# Patient Record
Sex: Female | Born: 1955 | Race: White | Hispanic: No | Smoking: Never smoker
Health system: Southern US, Community
[De-identification: ages and names within clinical notes are randomized; demographics above are authoritative.]

## PROBLEM LIST (undated history)

## (undated) DIAGNOSIS — M310 Hypersensitivity angiitis: Secondary | ICD-10-CM

## (undated) DIAGNOSIS — I10 Essential (primary) hypertension: Secondary | ICD-10-CM

## (undated) DIAGNOSIS — I350 Nonrheumatic aortic (valve) stenosis: Secondary | ICD-10-CM

## (undated) DIAGNOSIS — A5002 Early congenital syphilitic osteochondropathy: Secondary | ICD-10-CM

## (undated) DIAGNOSIS — G4730 Sleep apnea, unspecified: Secondary | ICD-10-CM

## (undated) DIAGNOSIS — F32A Depression, unspecified: Secondary | ICD-10-CM

## (undated) DIAGNOSIS — D649 Anemia, unspecified: Secondary | ICD-10-CM

## (undated) DIAGNOSIS — T7840XA Allergy, unspecified, initial encounter: Secondary | ICD-10-CM

## (undated) DIAGNOSIS — T8859XA Other complications of anesthesia, initial encounter: Secondary | ICD-10-CM

## (undated) DIAGNOSIS — F419 Anxiety disorder, unspecified: Secondary | ICD-10-CM

## (undated) DIAGNOSIS — N289 Disorder of kidney and ureter, unspecified: Secondary | ICD-10-CM

## (undated) DIAGNOSIS — G43909 Migraine, unspecified, not intractable, without status migrainosus: Secondary | ICD-10-CM

## (undated) DIAGNOSIS — I499 Cardiac arrhythmia, unspecified: Secondary | ICD-10-CM

## (undated) DIAGNOSIS — B079 Viral wart, unspecified: Secondary | ICD-10-CM

## (undated) DIAGNOSIS — K219 Gastro-esophageal reflux disease without esophagitis: Secondary | ICD-10-CM

## (undated) DIAGNOSIS — I471 Supraventricular tachycardia, unspecified: Secondary | ICD-10-CM

## (undated) DIAGNOSIS — M9080 Osteopathy in diseases classified elsewhere, unspecified site: Secondary | ICD-10-CM

## (undated) DIAGNOSIS — F329 Major depressive disorder, single episode, unspecified: Secondary | ICD-10-CM

## (undated) DIAGNOSIS — T4145XA Adverse effect of unspecified anesthetic, initial encounter: Secondary | ICD-10-CM

## (undated) DIAGNOSIS — E1129 Type 2 diabetes mellitus with other diabetic kidney complication: Secondary | ICD-10-CM

## (undated) DIAGNOSIS — B078 Other viral warts: Secondary | ICD-10-CM

## (undated) DIAGNOSIS — R011 Cardiac murmur, unspecified: Secondary | ICD-10-CM

## (undated) HISTORY — DX: Anxiety disorder, unspecified: F41.9

## (undated) HISTORY — DX: Hypersensitivity angiitis: M31.0

## (undated) HISTORY — DX: Viral wart, unspecified: B07.9

## (undated) HISTORY — DX: Depression, unspecified: F32.A

## (undated) HISTORY — DX: Gastro-esophageal reflux disease without esophagitis: K21.9

## (undated) HISTORY — DX: Type 2 diabetes mellitus with other diabetic kidney complication: E11.29

## (undated) HISTORY — DX: Allergy, unspecified, initial encounter: T78.40XA

## (undated) HISTORY — PX: JOINT REPLACEMENT: SHX530

## (undated) HISTORY — DX: Major depressive disorder, single episode, unspecified: F32.9

## (undated) HISTORY — PX: BUNIONECTOMY: SHX129

## (undated) HISTORY — PX: TOTAL KNEE ARTHROPLASTY: SHX125

---
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
## 1978-03-17 HISTORY — PX: TUBAL LIGATION: SHX77

## 1998-01-19 ENCOUNTER — Ambulatory Visit (HOSPITAL_COMMUNITY): Admission: RE | Admit: 1998-01-19 | Discharge: 1998-01-19 | Payer: Self-pay | Admitting: Family Medicine

## 1998-01-19 ENCOUNTER — Encounter: Payer: Self-pay | Admitting: Family Medicine

## 2000-03-17 HISTORY — PX: CARPAL TUNNEL RELEASE: SHX101

## 2002-08-16 HISTORY — PX: TONSILLECTOMY: SHX5217

## 2004-02-07 ENCOUNTER — Encounter: Admission: RE | Admit: 2004-02-07 | Discharge: 2004-02-07 | Payer: Self-pay | Admitting: Family Medicine

## 2006-07-08 ENCOUNTER — Encounter: Admission: RE | Admit: 2006-07-08 | Discharge: 2006-07-08 | Payer: Self-pay | Admitting: Family Medicine

## 2007-07-30 ENCOUNTER — Encounter: Admission: RE | Admit: 2007-07-30 | Discharge: 2007-07-30 | Payer: Self-pay | Admitting: Family Medicine

## 2008-11-14 ENCOUNTER — Encounter: Payer: Self-pay | Admitting: Pulmonary Disease

## 2008-11-27 ENCOUNTER — Encounter: Payer: Self-pay | Admitting: Pulmonary Disease

## 2008-11-30 ENCOUNTER — Ambulatory Visit: Payer: Self-pay | Admitting: Pulmonary Disease

## 2008-11-30 DIAGNOSIS — R059 Cough, unspecified: Secondary | ICD-10-CM | POA: Insufficient documentation

## 2008-11-30 DIAGNOSIS — R519 Headache, unspecified: Secondary | ICD-10-CM | POA: Insufficient documentation

## 2008-11-30 DIAGNOSIS — R51 Headache: Secondary | ICD-10-CM | POA: Insufficient documentation

## 2008-11-30 DIAGNOSIS — R05 Cough: Secondary | ICD-10-CM | POA: Insufficient documentation

## 2008-11-30 LAB — CONVERTED CEMR LAB
Angiotensin 1 Converting Enzyme: 44 units/L (ref 9–67)
Anti Nuclear Antibody(ANA): NEGATIVE
Hep B C IgM: NEGATIVE
Hepatitis B Surface Ag: NEGATIVE

## 2008-12-04 ENCOUNTER — Ambulatory Visit: Payer: Self-pay | Admitting: Cardiology

## 2008-12-07 ENCOUNTER — Telehealth: Payer: Self-pay | Admitting: Pulmonary Disease

## 2008-12-07 DIAGNOSIS — B1710 Acute hepatitis C without hepatic coma: Secondary | ICD-10-CM

## 2008-12-07 DIAGNOSIS — R3129 Other microscopic hematuria: Secondary | ICD-10-CM | POA: Insufficient documentation

## 2008-12-07 DIAGNOSIS — I776 Arteritis, unspecified: Secondary | ICD-10-CM | POA: Insufficient documentation

## 2008-12-07 HISTORY — DX: Acute hepatitis C without hepatic coma: B17.10

## 2008-12-07 LAB — CONVERTED CEMR LAB
ALT: 21 units/L (ref 0–35)
Albumin: 3.7 g/dL (ref 3.5–5.2)
Basophils Relative: 0.1 % (ref 0.0–3.0)
Bilirubin, Direct: 0.1 mg/dL (ref 0.0–0.3)
CO2: 31 meq/L (ref 19–32)
CRP, High Sensitivity: 28.1 — ABNORMAL HIGH (ref 0.00–5.00)
Chloride: 102 meq/L (ref 96–112)
Eosinophils Absolute: 0.2 10*3/uL (ref 0.0–0.7)
Eosinophils Relative: 2.6 % (ref 0.0–5.0)
HCT: 38.1 % (ref 36.0–46.0)
Lymphs Abs: 1.8 10*3/uL (ref 0.7–4.0)
MCHC: 34.4 g/dL (ref 30.0–36.0)
MCV: 94.4 fL (ref 78.0–100.0)
Monocytes Absolute: 0.2 10*3/uL (ref 0.1–1.0)
Neutro Abs: 7.1 10*3/uL (ref 1.4–7.7)
Potassium: 3.6 meq/L (ref 3.5–5.1)
RBC: 4.04 M/uL (ref 3.87–5.11)
Rhuematoid fact SerPl-aCnc: <20 intl units/mL (ref 0.0–20.0)
Specific Gravity, Urine: 1.02 (ref 1.000–1.030)
TSH: 0.99 microintl units/mL (ref 0.35–5.50)
Total Protein: 8.1 g/dL (ref 6.0–8.3)
Urine Glucose: NEGATIVE mg/dL
Urobilinogen, UA: 1 (ref 0.0–1.0)
WBC: 9.3 10*3/uL (ref 4.5–10.5)
pH: 7 (ref 5.0–8.0)

## 2008-12-08 ENCOUNTER — Telehealth: Payer: Self-pay | Admitting: Pulmonary Disease

## 2008-12-12 ENCOUNTER — Ambulatory Visit: Payer: Self-pay | Admitting: Pulmonary Disease

## 2008-12-19 ENCOUNTER — Ambulatory Visit: Payer: Self-pay | Admitting: Pulmonary Disease

## 2008-12-20 ENCOUNTER — Encounter: Payer: Self-pay | Admitting: Pulmonary Disease

## 2008-12-21 ENCOUNTER — Encounter: Payer: Self-pay | Admitting: Pulmonary Disease

## 2009-01-03 ENCOUNTER — Ambulatory Visit: Payer: Self-pay | Admitting: Pulmonary Disease

## 2009-01-03 DIAGNOSIS — R0600 Dyspnea, unspecified: Secondary | ICD-10-CM | POA: Insufficient documentation

## 2009-01-03 DIAGNOSIS — R0689 Other abnormalities of breathing: Secondary | ICD-10-CM | POA: Insufficient documentation

## 2009-01-31 ENCOUNTER — Encounter: Payer: Self-pay | Admitting: Pulmonary Disease

## 2010-04-07 ENCOUNTER — Encounter: Payer: Self-pay | Admitting: Family Medicine

## 2010-04-08 ENCOUNTER — Encounter: Payer: Self-pay | Admitting: Pulmonary Disease

## 2010-04-10 ENCOUNTER — Telehealth (INDEPENDENT_AMBULATORY_CARE_PROVIDER_SITE_OTHER): Payer: Self-pay | Admitting: *Deleted

## 2010-04-10 LAB — SURGICAL PCR SCREEN: Staphylococcus aureus: NEGATIVE

## 2010-04-10 LAB — CBC
HCT: 37.7 % (ref 36.0–46.0)
Hemoglobin: 12.5 g/dL (ref 12.0–15.0)
MCH: 30.1 pg (ref 26.0–34.0)
MCHC: 33.2 g/dL (ref 30.0–36.0)

## 2010-04-10 LAB — BASIC METABOLIC PANEL
BUN: 15 mg/dL (ref 6–23)
Chloride: 104 mEq/L (ref 96–112)
Potassium: 4.1 mEq/L (ref 3.5–5.1)
Sodium: 138 mEq/L (ref 135–145)

## 2010-04-10 LAB — PROTIME-INR: INR: 0.96 (ref 0.00–1.49)

## 2010-04-16 ENCOUNTER — Inpatient Hospital Stay (HOSPITAL_COMMUNITY)
Admission: RE | Admit: 2010-04-16 | Discharge: 2010-04-19 | DRG: 209 | Disposition: A | Discharge status: Home Health | Payer: BC Managed Care – PPO | Attending: Orthopaedic Surgery | Admitting: Orthopaedic Surgery

## 2010-04-16 DIAGNOSIS — E669 Obesity, unspecified: Secondary | ICD-10-CM | POA: Diagnosis present

## 2010-04-16 DIAGNOSIS — IMO0002 Reserved for concepts with insufficient information to code with codable children: Principal | ICD-10-CM | POA: Diagnosis present

## 2010-04-16 DIAGNOSIS — M1710 Unilateral primary osteoarthritis, unspecified knee: Principal | ICD-10-CM | POA: Diagnosis present

## 2010-04-16 DIAGNOSIS — K219 Gastro-esophageal reflux disease without esophagitis: Secondary | ICD-10-CM | POA: Diagnosis present

## 2010-04-16 LAB — HEPATIC FUNCTION PANEL
AST: 21 U/L (ref 0–37)
Albumin: 3.7 g/dL (ref 3.5–5.2)
Total Bilirubin: 0.6 mg/dL (ref 0.3–1.2)

## 2010-04-17 LAB — BASIC METABOLIC PANEL
CO2: 30 mEq/L (ref 19–32)
Calcium: 8.6 mg/dL (ref 8.4–10.5)
Chloride: 97 mEq/L (ref 96–112)
GFR calc Af Amer: >60 mL/min (ref >60)
Glucose, Bld: 119 mg/dL — ABNORMAL HIGH (ref 70–99)
Potassium: 4 mEq/L (ref 3.5–5.1)
Sodium: 135 mEq/L (ref 135–145)

## 2010-04-17 LAB — CBC
HCT: 33.5 % — ABNORMAL LOW (ref 36.0–46.0)
Hemoglobin: 11.2 g/dL — ABNORMAL LOW (ref 12.0–15.0)
MCH: 31.1 pg (ref 26.0–34.0)
MCHC: 33.4 g/dL (ref 30.0–36.0)
MCV: 93.1 fL (ref 78.0–100.0)
RBC: 3.6 MIL/uL — ABNORMAL LOW (ref 3.87–5.11)

## 2010-04-17 LAB — PROTIME-INR: INR: 1.03 (ref 0.00–1.49)

## 2010-04-18 LAB — CBC
HCT: 27.2 % — ABNORMAL LOW (ref 36.0–46.0)
MCH: 32.1 pg (ref 26.0–34.0)
MCV: 92.8 fL (ref 78.0–100.0)
Platelets: 163 10*3/uL (ref 150–400)
RDW: 13.1 % (ref 11.5–15.5)
WBC: 11.9 10*3/uL — ABNORMAL HIGH (ref 4.0–10.5)

## 2010-04-18 LAB — BASIC METABOLIC PANEL
CO2: 32 mEq/L (ref 19–32)
Chloride: 95 mEq/L — ABNORMAL LOW (ref 96–112)
Creatinine, Ser: 0.69 mg/dL (ref 0.4–1.2)
GFR calc Af Amer: >60 mL/min (ref >60)
Glucose, Bld: 125 mg/dL — ABNORMAL HIGH (ref 70–99)
Sodium: 134 mEq/L — ABNORMAL LOW (ref 135–145)

## 2010-04-18 NOTE — Progress Notes (Signed)
Summary: Records Request  Faxed OV to [facility] at [facility] Short Stay ([phone]). Debby Freiberg  April 10, 2010 12:40 PM

## 2010-04-19 LAB — BASIC METABOLIC PANEL
BUN: 7 mg/dL (ref 6–23)
CO2: 34 mEq/L — ABNORMAL HIGH (ref 19–32)
Glucose, Bld: 103 mg/dL — ABNORMAL HIGH (ref 70–99)
Potassium: 3.4 mEq/L — ABNORMAL LOW (ref 3.5–5.1)
Sodium: 140 mEq/L (ref 135–145)

## 2010-04-19 LAB — CBC
Hemoglobin: 9 g/dL — ABNORMAL LOW (ref 12.0–15.0)
MCH: 30.9 pg (ref 26.0–34.0)
MCHC: 33.6 g/dL (ref 30.0–36.0)
MCV: 92.1 fL (ref 78.0–100.0)
RBC: 2.91 MIL/uL — ABNORMAL LOW (ref 3.87–5.11)

## 2010-05-08 NOTE — Discharge Summary (Signed)
NAMERAKEYA, GLAB                 ACCOUNT NO.:  [id number]  MEDICAL RECORD NO.:  [id number]           PATIENT TYPE:  I  LOCATION:  5034                         FACILITY:  [facility]  PHYSICIAN:  Lubertha Basque. Macayla Ekdahl, M.D.DATE OF BIRTH:  [birth year]  DATE OF ADMISSION:  04/16/2010 DATE OF DISCHARGE:  04/19/2010                              DISCHARGE SUMMARY   ADMITTING DIAGNOSES: 1. Right knee end-stage degenerative joint disease. 2. History of depression. 3. Status post tubal ligation. 4. Status post foot surgeries.  DISCHARGE DIAGNOSES: 1. Right knee end-stage degenerative joint disease. 2. History of depression. 3. Status post tubal ligation. 4. Status post foot surgeries.  OPERATION:  Right total knee replacement.  BRIEF HISTORY:  Tomeshia is a patient well-known to our practice, who is 55 years old.  She has been complaining of right knee pain, trouble walking, and trouble sleeping.  She has failed anti-inflammatory medicines and injections, and her x-rays revealed bone-on-bone end-stage DJD.  We talked about the benefits and risk and complications of total knee replacement.  PERTINENT LABORATORY AND X-RAY FINDINGS:  Last INR 1.18.  Chem-7; sodium 134, potassium 3.9, BUN 5, creatinine 0.69, glucose 125.  Hemoglobin 9.4, hematocrit 27.2, WBCs 11.9, platelets 163.  COURSE IN THE HOSPITAL:  She was admitted postoperatively, kept on her home medicines, which will be outlined at the end of this dictation and available in the discharge med management sheets.  The postoperative total joint replacement protocol was ordered.  She had implemented total joint replacement protocol as well as implemented orthopedic p.r.n. orders.  Regular diet as tolerated, advance slowly.  IV fluids, Lovenox, and Coumadin regulated by Pharmacy for DVT prophylaxis and we wanted Coumadin for 2 weeks and then Lovenox until her INR was close to 2 or greater, but below 3.0.  Appropriate antiemetics,  antispasmodics, stool softeners.  Foley catheter used initially and then discontinued.  CPM machine 0-60 degrees advancing as tolerated about 5-6 hours per day and then appropriate pain medicines and what we found was the most helpful was the oxycodone IR 5 mg one or two q.4-6 along with Robaxin as a muscle relaxer 500 mg one p.o. q.6-8.  Physical therapy weightbearing as tolerated.  Serial laboratory data was taken, and this is inclusive in electronic medical record.  The first day postop, her vital signs were stable. Wound was noted to be benign.  No sign of infection or irritation. Normal neurovascular status in her lower extremities.  Lungs were clear. Abdomen was soft.  Temperature max 100.3, but was in the normal range for most of the other readings.  Blood pressure 128/85.  Lungs clear. Abdomen soft with positive bowel sounds.  She was eating and then catheter was removed and was able to void well.  She had work with physical therapy and had gotten to the door, but progress was slow and then felt that maybe a skilled nursing facility would be helpful for therapy reasons plus she lives alone.  Second day postop, her dressing was changed.  Her wound was noted to be benign.  Her drain was pulled.  No sign of infection or  irritation. Calf soft and nontender.  Negative Homans.  Lungs were clear.  Abdomen was soft.  Hemoglobin 9.4, INR 1.18.  blood pressure 113/78.  Condition on discharge is improved.  She can be weightbearing as tolerated.  We would still like to have her working with physical therapy as soon as possible for motion and strengthening and gait.  Diet unrestricted.  May change her dressing daily.  Any sign of infection, to call our office at [phone], we will see her right away, otherwise we would see her back in 2 weeks from the surgical date.  Staples will be left in until that visit.  She will remain on her home medicines which are available in the discharge  medications summary as well as OxyIR, Robaxin, and probably necessary to keep her on Lovenox until her Coumadin INR level is 2.0-3.0.  Current dose of Lovenox in the hospital is 30 mg subcu q.12.  The total length of time on Coumadin will be 2 weeks from the surgical date.  Use of a CPM machine once again 0-50-60 degrees advancing often 5-10 degrees as tolerated, 5-6 hours per day would be appropriate.     Lindwood Qua, P.A.   ______________________________ Lubertha Basque. Jerl Santos, M.D.    MC/MEDQ  D:  04/18/2010  T:  04/19/2010  Job:  [id number]  Electronically Signed by Lindwood Qua P.A. on 04/19/2010 41:32:44 PM Electronically Signed by Marcene Corning M.D. on 05/08/2010 02:19:17 PM

## 2010-05-08 NOTE — Op Note (Signed)
NAMEFATEMAH, POURCIAU                 ACCOUNT NO.:  [id number]  MEDICAL RECORD NO.:  [id number]          PATIENT TYPE:  INP  LOCATION:  5034                         FACILITY:  [facility]  PHYSICIAN:  Lubertha Basque. Gussie Murton, M.D.DATE OF BIRTH:  01/08/1956  DATE OF PROCEDURE:  04/16/2010 DATE OF DISCHARGE:                              OPERATIVE REPORT   PREOPERATIVE DIAGNOSIS:  Right knee degenerative joint disease.  POSTOPERATIVE DIAGNOSIS:  Right knee degenerative joint disease.  PROCEDURE:  Right total knee replacement.  ANESTHESIA:  General and block.  ATTENDING SURGEON:  Lubertha Basque. Jerl Santos, MD  ASSISTANT:  Lindwood Qua, PA   INDICATIONS FOR PROCEDURE:  The patient is a 55 year old woman with a long history of painful right knee.  She has advanced degenerative change by x-ray and failed oral anti-inflammatories and multiple injectables.  She has pain, which limits ability to rest and walk, and she is offered a knee replacement.  Informed operative consent was obtained after discussion of possible complications including reaction to anesthesia, infection, DVT, PE, and death.  The importance of the postoperative rehabilitation protocol to optimize result was stressed extensively with the patient.  SUMMARY FINDINGS AND PROCEDURE:  Under general anesthesia and block, a right knee replacement was performed.  She had advanced degenerative change medial and patellofemoral and excellent bone quality.  We addressed her problem with a cemented DePuy system using a standard plus femur, 38 all polyethylene patella, 10 deep dish spacer, and 3 MBT tibial tray to address her stature.  We did include Zinacef antibiotic in the cement.  Lindwood Qua assisted throughout and was invaluable to the completion of the case in that he helped position and retract while I performed the procedure.  DESCRIPTION OF PROCEDURE:  The patient was brought to the operating suite where general anesthetic was  applied without difficulty.  She also given a block in the preanesthesia area.  She was positioned supine and prepped and draped in normal sterile fashion.  After administration of IV Kefzol, the right leg was elevated, exsanguinated, and a tourniquet inflated about the thigh.  A longitudinal anterior incision was made with dissection down the extensor mechanism.  All appropriate anti- infective measures were used including closed hooded exhaust systems for each member of the surgical team, Betadine-impregnated drape, and the preoperative IV antibiotic.  A medial parapatellar incision was made. The kneecap was flipped and knee flexed.  Some residual meniscal tissues were removed along with the ACL and PCL.  An extramedullary guide was placed in the tibia to make a roughly flat cut.  An intramedullary guide was then placed in the femur to make anterior and posterior cuts creating a flexion gap of 10 mm.  A second intramedullary guide was placed in the femur to make a distal cut creating an equal extension gap of 10 mm.  We tensionally set her up a little bit loose as she did have a bit of a flexion contracture preop.  I performed a bit of a medial release off the tibia to address her varus deformity, and the knee was balanced.  The femur sized to a standard plus  and the tibia to a three with appropriate guides placed and utilized including reaming for short stem on the tibia to address her stature.  A trial reduction was done with all these components and the 10 spacer, and she easily came to slight hyperextension and flexed well.  We cut down the patella and thickness by about 10 mm to size 15.  This seemed to fit with the 38 trial, and the appropriate guide was placed and utilized.  Trial components removed followed by pulsatile lavage, irrigation of all three cut bony surfaces.  Cement was mixed including Zinacef and was pressurized onto the dried bones followed by placement of  the aforementioned DePuy components.  Excess cement was trimmed and pressure was held on the components until the cement had hardened.  The tourniquet was deflated and a small amount of bleeding was easily controlled with Bovie cautery and pressure.  A drain was placed exiting superolaterally.  The extensor mechanism was reapproximated with #1 Vicryl interrupted fashion followed by subcutaneous reapproximation with 0 and 2-0 undyed Vicryl and skin closure with staples.  Adaptic was applied followed by dry gauze and a loose Ace wrap.  Estimated blood loss and intraoperative fluids can be obtained from anesthesia records as can accurate tourniquet time.  DISPOSITION:  The patient was extubated in the operating room and taken to the recovery room in stable condition.  She was to be admitted to the Orthopedic Surgery Service for appropriate postop care to include perioperative antibiotics and Coumadin plus Lovenox for DVT prophylaxis.     Lubertha Basque Jerl Santos, M.D.     PGD/MEDQ  D:  04/16/2010  T:  04/17/2010  Job:  [id number]  Electronically Signed by Marcene Corning M.D. on 05/08/2010 02:20:16 PM

## 2010-12-18 ENCOUNTER — Other Ambulatory Visit: Payer: Self-pay | Admitting: Family Medicine

## 2010-12-18 DIAGNOSIS — Z1231 Encounter for screening mammogram for malignant neoplasm of breast: Secondary | ICD-10-CM

## 2011-01-02 ENCOUNTER — Ambulatory Visit: Payer: BC Managed Care – PPO

## 2011-01-15 ENCOUNTER — Ambulatory Visit
Admission: RE | Admit: 2011-01-15 | Discharge: 2011-01-15 | Disposition: A | Discharge status: Home / Self Care | Payer: BC Managed Care – PPO | Source: Ambulatory Visit | Attending: Family Medicine | Admitting: Family Medicine

## 2011-01-15 DIAGNOSIS — Z1231 Encounter for screening mammogram for malignant neoplasm of breast: Secondary | ICD-10-CM

## 2011-01-22 ENCOUNTER — Other Ambulatory Visit: Payer: Self-pay | Admitting: Family Medicine

## 2011-01-22 DIAGNOSIS — R928 Other abnormal and inconclusive findings on diagnostic imaging of breast: Secondary | ICD-10-CM

## 2011-02-26 ENCOUNTER — Ambulatory Visit
Admission: RE | Admit: 2011-02-26 | Discharge: 2011-02-26 | Disposition: A | Discharge status: Home / Self Care | Payer: BC Managed Care – PPO | Source: Ambulatory Visit | Attending: Family Medicine | Admitting: Family Medicine

## 2011-02-26 DIAGNOSIS — R928 Other abnormal and inconclusive findings on diagnostic imaging of breast: Secondary | ICD-10-CM

## 2012-05-30 ENCOUNTER — Encounter: Payer: Self-pay | Admitting: *Deleted

## 2012-06-11 ENCOUNTER — Encounter: Payer: Self-pay | Admitting: Physician Assistant

## 2012-06-11 ENCOUNTER — Ambulatory Visit (INDEPENDENT_AMBULATORY_CARE_PROVIDER_SITE_OTHER): Payer: BC Managed Care – PPO | Admitting: Physician Assistant

## 2012-06-11 VITALS — BP 114/70 | HR 60 | Temp 98.1°F | Resp 18 | Ht 62.0 in | Wt 244.0 lb

## 2012-06-11 DIAGNOSIS — Z0000 Encounter for general adult medical examination without abnormal findings: Secondary | ICD-10-CM

## 2012-06-11 DIAGNOSIS — F32A Depression, unspecified: Secondary | ICD-10-CM | POA: Insufficient documentation

## 2012-06-11 DIAGNOSIS — N3281 Overactive bladder: Secondary | ICD-10-CM

## 2012-06-11 DIAGNOSIS — F329 Major depressive disorder, single episode, unspecified: Secondary | ICD-10-CM | POA: Insufficient documentation

## 2012-06-11 DIAGNOSIS — K219 Gastro-esophageal reflux disease without esophagitis: Secondary | ICD-10-CM | POA: Insufficient documentation

## 2012-06-11 DIAGNOSIS — N318 Other neuromuscular dysfunction of bladder: Secondary | ICD-10-CM

## 2012-06-11 DIAGNOSIS — F419 Anxiety disorder, unspecified: Secondary | ICD-10-CM | POA: Insufficient documentation

## 2012-06-11 MED ORDER — OXYBUTYNIN CHLORIDE 5 MG PO TABS: 5.0000 mg | ORAL_TABLET | Freq: Three times a day (TID) | ORAL | Status: DC

## 2012-06-11 MED ORDER — ESOMEPRAZOLE MAGNESIUM 40 MG PO CPDR: 40.0000 mg | DELAYED_RELEASE_CAPSULE | Freq: Every day | ORAL | Status: DC

## 2012-06-12 NOTE — Progress Notes (Signed)
Patient ID: Cassandra [name] MRN: [id number], DOB: 29-May-1955, 57 y.o. Date of Encounter: 06/12/2012,   Chief Complaint: Physical (CPE)  HPI: 57 y.o. y/o female with history of noted below here for CPE.   Reports tha when took Myrbetriq she had pain in left back. Went off Mybetriq. Tried taking it again-had back pain again so has been off of it since.  In past used Vesicare but this was stopped b/c it caused severe dry mouth. Also pt notes this med cost her $50. Does want to get on med for this. "Would rather deal with dry mouth than leaking urine."  Review of Systems: Consitutional: No fever, chills, fatigue, night sweats, lymphadenopathy. No significant/unexplained weight changes. Eyes: No visual changes, eye redness, or discharge. ENT/Mouth: No ear pain, sore throat, nasal drainage, or sinus pain. Cardiovascular: No chest pressure,heaviness, tightness or squeezing, even with exertion. No increased shortness of breath or dyspnea on exertion.No palpitations, edema, orthopnea, PND. Respiratory: No cough, hemoptysis, SOB, or wheezing. Gastrointestinal: No anorexia, dysphagia, reflux, pain, nausea, vomiting, hematemesis, diarrhea, constipation, BRBPR, or melena. Breast: No mass, nodules, bulging, or retraction. No skin changes or inflammation. No nipple discharge. No lymphadenopathy. Genitourinary: No dysuria, hematuria, vaginal discharge, pruritis, burning, abnormal bleeding, or pain. Musculoskeletal: No decreased ROM, No joint pain or swelling. No significant pain in neck, back, or extremities. Skin: No rash, pruritis, or concerning lesions. Neurological: No headache, dizziness, syncope, seizures, tremors, memory loss, coordination problems, or paresthesias. Psychological: No anxiety, depression, hallucinations, SI/HI. Endocrine: No polydipsia, polyphagia, polyuria, or known diabetes.No increased fatigue. No palpitations/rapid heart rate. No significant/unexplained weight change. All other  systems were reviewed and are otherwise negative.  Past Medical History  Diagnosis Date  . Allergy   . Anxiety   . Depression   . GERD (gastroesophageal reflux disease)      Past Surgical History  Procedure Laterality Date  . Tonsillectomy  08/2002  . Carpal tunnel release Right 2002  . Tubal ligation  1980    Home Meds:  No current outpatient prescriptions on file prior to visit.   No current facility-administered medications on file prior to visit.    Allergies:  Allergies  Allergen Reactions  . Ampicillin   . Clarithromycin   . Codeine   . Effexor (Venlafaxine)   . Flagyl (Metronidazole)   . Fluoxetine Hcl Itching    Deep Itch   . Levofloxacin   . Myrbetriq (Mirabegron)     Caused back pain  . Nsaids Other (See Comments)    Hypersensitivity vasculitis  . Paxil (Paroxetine)   . Sulfa Antibiotics   . Tetracycline     History   Social History  . Marital Status: Divorced    Spouse Name: N/A    Number of Children: N/A  . Years of Education: N/A   Occupational History  . Not on file.   Social History Main Topics  . Smoking status: Never Smoker   . Smokeless tobacco: Not on file  . Alcohol Use: No  . Drug Use: No  . Sexually Active: Not on file   Other Topics Concern  . Not on file   Social History Narrative  . No narrative on file    Family History  Problem Relation Age of Onset  . Clotting disorder Mother 81    Blood clot, foot amputation  . Arthritis Sister   . Heart attack Maternal Grandmother 51    Physical Exam: Blood pressure 114/70, pulse 60, temperature 98.1 F (36.7 C), temperature source  Oral, resp. rate 18, height 5\' 2"  (1.575 m), weight 244 lb (110.678 kg)., Body mass index is 44.62 kg/(m^2). General: Obese WF. in no acute distress. HEENT: Normocephalic, atraumatic. Conjunctiva pink, sclera non-icteric. Pupils 2 mm constricting to 1 mm, round, regular, and equally reactive to light and accomodation. EOMI. Internal auditory  canal clear. TMs with good cone of light and without pathology. Nasal mucosa pink. Nares are without discharge. No sinus tenderness. Oral mucosa pink.  Pharynx without exudate.   Neck: Supple. Trachea midline. No thyromegaly. Full ROM. No lymphadenopathy.No Carotid Bruits. Lungs: Clear to auscultation bilaterally without wheezes, rales, or rhonchi. Breathing is of normal effort and unlabored. Cardiovascular: RRR with S1 S2. No murmurs, rubs, or gallops. Distal pulses 2+ symmetrically. No carotid or abdominal bruits. Breast: Symmetrical. No masses. Nipples without discharge. Abdomen: Soft, non-tender, non-distended with normoactive bowel sounds. No hepatosplenomegaly or masses. No rebound/guarding. No CVA tenderness. No hernias.  Genitourinary:  External genitalia without lesions. Vaginal mucosa pink.No discharge present. Cervix pink and without discharge. No cervical tenderness.Normal uterus size. No adnexal mass or tenderness.   Musculoskeletal: Full range of motion and 5/5 strength throughout. Without swelling, atrophy, tenderness, crepitus, or warmth. Extremities without clubbing, cyanosis, or edema. Calves supple. Skin: Warm and moist without erythema, ecchymosis, wounds, or rash. Neuro: A+Ox3. CN II-XII grossly intact. Moves all extremities spontaneously. Full sensation throughout. Normal gait. DTR 2+ throughout upper and lower extremities. Finger to nose intact. Psych:  Responds to questions appropriately with a normal affect.     Mammogram: Last 12/12. Pt to schedule f/u. She is aware and agreeable  DEXA/BMD: will wait to start in future Colonoscopy: Had 1/13 Pap: 9/12: Nml. Repeat 3-5 years  Immunization History  Administered Date(s) Administered  . Influenza Split 11/27/2011  . Td 12/16/2003   Immunizations: Flu: UTD  Tetanus:UTD Pneumococcal: At age 31 Zostavax: At Age 73  Assessment/Plan:  57 y.o. y/o female here for CPE 1. Visit for preventive health examination See above  reg preventive tests, immunizations. She had pap here 9/12 which was normal. Wait 3-5 years to repeat. She had screening labs last week. They were discussed today. She is to decrease sweets, breads, potatoes, pasta to decrease triglycerides.  She is to increase Vit D from 2,000 to 3,000 IU QD. Remainder of labs nml.  2. Overactive bladder Discussed with pt that other meds for OAB (except Myrbetiq. See HPI -intolerant to Myrbetriq) will probably cause some dry mouth, etc) She is agreeable to use med-says can deal with dry mouth but not leaking urine. However, Vesicare was $50 for her in past. Will try cheaper option. - oxybutynin (DITROPAN) 5 MG tablet; Take 1 tablet (5 mg total) by mouth 3 (three) times daily.  Dispense: 90 tablet; Refill: 11  3. GERD (gastroesophageal reflux disease) Nexium controls her symptoms. Needs to cont med. Without med does have gerd symptoms. - esomeprazole (NEXIUM) 40 MG capsule; Take 1 capsule (40 mg total) by mouth daily.  Dispense: 30 capsule; Refill: [address] [place], [place], [facility] 06/12/2012 7:28 AM

## 2012-07-15 ENCOUNTER — Telehealth: Payer: Self-pay | Admitting: Physician Assistant

## 2012-07-15 MED ORDER — SERTRALINE HCL 50 MG PO TABS: 50.0000 mg | ORAL_TABLET | Freq: Every day | ORAL | Status: DC

## 2012-07-15 NOTE — Telephone Encounter (Signed)
Sertraline 50 mg tablet take 2 by mouth at bedtime last office visit 06/11/12 last refill 03/15/12

## 2012-07-15 NOTE — Telephone Encounter (Signed)
Medication refilled per protocol. 

## 2012-07-27 ENCOUNTER — Telehealth: Payer: Self-pay | Admitting: Family Medicine

## 2012-07-27 NOTE — Telephone Encounter (Signed)
Spoke to pt she states that after taking Nexium her mouth broke out with sores and her tongue started swelling. She had some Magic Mouthwash at home and used it and the blisters went away. She would like to try something else to see if she has the same rxn. She has taken Prevacid in the past with no rxn but insurance would not cover it. I told her to come by here and get samples of Prilosec OTC 20 mg and try taking that and if she has not rxn to that we would be glad to call her in a prescription.

## 2012-07-28 NOTE — Telephone Encounter (Signed)
Agree 

## 2012-07-29 NOTE — Telephone Encounter (Signed)
Pt has been called. Sample medication at front desk

## 2012-08-31 ENCOUNTER — Ambulatory Visit (INDEPENDENT_AMBULATORY_CARE_PROVIDER_SITE_OTHER): Payer: BC Managed Care – PPO | Admitting: Family Medicine

## 2012-08-31 ENCOUNTER — Encounter: Payer: Self-pay | Admitting: Family Medicine

## 2012-08-31 VITALS — BP 140/86 | HR 78 | Temp 98.2°F | Resp 18 | Wt 238.0 lb

## 2012-08-31 DIAGNOSIS — F411 Generalized anxiety disorder: Secondary | ICD-10-CM

## 2012-08-31 MED ORDER — SERTRALINE HCL 50 MG PO TABS: 50.0000 mg | ORAL_TABLET | Freq: Every day | ORAL | Status: DC

## 2012-08-31 NOTE — Progress Notes (Signed)
  Subjective:    Patient ID: Cassandra [name], female    DOB: 08-12-1955, 57 y.o.   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  This has been quiescent for 2 years since she quit NSAIDs.  However, recently, she began to develop sores in her mouth and swelling in her lips.  She thought it was her medicines so she quit zoloft and nexium.  Gradually, the reaction stopped over 1 month.  However, she now has terrible anxiety and daily panic attacks.  She would like to resume the zoloft since she is not sure it was the cause of the reaction.   Past Medical History  Diagnosis Date  . Allergy   . Anxiety   . Depression   . GERD (gastroesophageal reflux disease)    Patient Active Problem List   Diagnosis Date Noted  . Anxiety   . Depression   . GERD (gastroesophageal reflux disease)   . DYSPNEA 01/03/2009  . HEPATITIS C 12/07/2008  . VASCULITIS 12/07/2008  . MICROSCOPIC HEMATURIA 12/07/2008  . HEADACHE, CHRONIC 11/30/2008  . COUGH 11/30/2008   Current Outpatient Prescriptions on File Prior to Visit  Medication Sig Dispense Refill  . Cholecalciferol (VITAMIN D3) 2000 UNITS CHEW Chew 2,000 Units by mouth daily.      Marland Kitchen oxybutynin (DITROPAN) 5 MG tablet Take 1 tablet (5 mg total) by mouth 3 (three) times daily.  90 tablet  11   No current facility-administered medications on file prior to visit.   Allergies  Allergen Reactions  . Ampicillin   . Clarithromycin   . Codeine   . Effexor (Venlafaxine)   . Flagyl (Metronidazole)   . Fluoxetine Hcl Itching    Deep Itch   . Levofloxacin   . Myrbetriq (Mirabegron)     Caused back pain  . Nexium (Esomeprazole Magnesium) Swelling    Swelling of tongue and sores in mouth  . Nsaids Other (See Comments)    Hypersensitivity vasculitis  . Paxil (Paroxetine)   . Sulfa Antibiotics   . Tetracycline       Review of Systems  All other systems reviewed and are negative.       Objective:   Physical  Exam  Vitals reviewed. Cardiovascular: Normal rate, regular rhythm and normal heart sounds.   Pulmonary/Chest: Effort normal and breath sounds normal.  Psychiatric: She has a normal mood and affect. Her behavior is normal. Judgment and thought content normal.          Assessment & Plan:  1. GAD (generalized anxiety disorder) Resume zoloft 50 mg poqhs.  Recheck in 4 weeks and increase if necessary.  DC med immediately if reaction returns.  Consider vasculitis as a cause of reaction if we rule out medication as a cause of reaction.

## 2012-10-22 ENCOUNTER — Other Ambulatory Visit: Payer: Self-pay | Admitting: Family Medicine

## 2013-01-20 ENCOUNTER — Other Ambulatory Visit: Payer: Self-pay

## 2013-03-15 ENCOUNTER — Encounter (HOSPITAL_COMMUNITY): Payer: Self-pay | Admitting: Emergency Medicine

## 2013-03-15 ENCOUNTER — Emergency Department (INDEPENDENT_AMBULATORY_CARE_PROVIDER_SITE_OTHER)
Admission: EM | Admit: 2013-03-15 | Discharge: 2013-03-15 | Disposition: A | Discharge status: Home / Self Care | Payer: Self-pay | Source: Home / Self Care | Attending: Family Medicine | Admitting: Family Medicine

## 2013-03-15 DIAGNOSIS — H1010 Acute atopic conjunctivitis, unspecified eye: Secondary | ICD-10-CM

## 2013-03-15 DIAGNOSIS — H1012 Acute atopic conjunctivitis, left eye: Secondary | ICD-10-CM

## 2013-03-15 NOTE — ED Notes (Signed)
Concern for poss pink eye 

## 2013-03-15 NOTE — ED Provider Notes (Signed)
Cassandra [name] is a 57 y.o. female who presents to Urgent Care today for left eye conjunctival injection associated with itchy watery eyes and a mild foreign body sensation. Patient denies any object getting into her eye. Her symptoms started after she was eating some almonds which she suspects that she is allergic to. This started yesterday. She had mouth itching and tingling which has since resolved after some Benadryl yesterday. She denies any swelling or trouble breathing or swallowing. She denies any significant blurry vision.   Past Medical History  Diagnosis Date  . Allergy   . Anxiety   . Depression   . GERD (gastroesophageal reflux disease)    History  Substance Use Topics  . Smoking status: Never Smoker   . Smokeless tobacco: Not on file  . Alcohol Use: No   ROS as above Medications reviewed. No current facility-administered medications for this encounter.   Current Outpatient Prescriptions  Medication Sig Dispense Refill  . Cholecalciferol (VITAMIN D3) 2000 UNITS CHEW Chew 2,000 Units by mouth daily.      . famotidine (PEPCID) 20 MG tablet Take 20 mg by mouth 2 (two) times daily.      Marland Kitchen oxybutynin (DITROPAN) 5 MG tablet Take 1 tablet (5 mg total) by mouth 3 (three) times daily.  90 tablet  11  . sertraline (ZOLOFT) 50 MG tablet TAKE TWO TABLETS BY MOUTH AT BEDTIME  60 tablet  5    Exam:  BP 138/64  Pulse 69  Temp(Src) 98 F (36.7 C) (Oral)  Resp 16  SpO2 100% Gen: Well NAD HEENT: EOMI,  MMM left conjunctival injection. Right eye normal. PERRLA. Fluorescein stain shows no abrasions. No foreign bodies are seen.  No tongue or lip swelling   Assessment and Plan: 57 y.o. female with left I. allergic conjunctivitis. Plan for treatment with oral cetirizine and histamine as well as topical Zaditor eyedrops and systain artificial tears. \Discussed warning signs or symptoms. Please see discharge instructions. Patient expresses understanding.      Rodolph Bong,  MD 03/15/13 [phone]

## 2013-03-16 ENCOUNTER — Emergency Department (HOSPITAL_COMMUNITY)
Admission: EM | Admit: 2013-03-16 | Discharge: 2013-03-16 | Disposition: A | Discharge status: Home / Self Care | Payer: Self-pay | Attending: Emergency Medicine | Admitting: Emergency Medicine

## 2013-03-16 ENCOUNTER — Encounter (HOSPITAL_COMMUNITY): Payer: Self-pay | Admitting: Emergency Medicine

## 2013-03-16 DIAGNOSIS — F411 Generalized anxiety disorder: Secondary | ICD-10-CM | POA: Insufficient documentation

## 2013-03-16 DIAGNOSIS — H1045 Other chronic allergic conjunctivitis: Secondary | ICD-10-CM | POA: Insufficient documentation

## 2013-03-16 DIAGNOSIS — F329 Major depressive disorder, single episode, unspecified: Secondary | ICD-10-CM | POA: Insufficient documentation

## 2013-03-16 DIAGNOSIS — F3289 Other specified depressive episodes: Secondary | ICD-10-CM | POA: Insufficient documentation

## 2013-03-16 DIAGNOSIS — H109 Unspecified conjunctivitis: Secondary | ICD-10-CM

## 2013-03-16 DIAGNOSIS — K219 Gastro-esophageal reflux disease without esophagitis: Secondary | ICD-10-CM | POA: Insufficient documentation

## 2013-03-16 DIAGNOSIS — Z79899 Other long term (current) drug therapy: Secondary | ICD-10-CM | POA: Insufficient documentation

## 2013-03-16 MED ORDER — BACITRACIN-POLYMYXIN B 500-10000 UNIT/GM OP OINT: 1.0000 "application " | TOPICAL_OINTMENT | Freq: Two times a day (BID) | OPHTHALMIC | Status: DC

## 2013-03-16 MED ORDER — EPINEPHRINE 0.3 MG/0.3ML IJ SOAJ
0.3000 mg | INTRAMUSCULAR | Status: AC
  Administered 2013-03-16: 0.3 mg via SUBCUTANEOUS
  Filled 2013-03-16: qty 0.3

## 2013-03-16 NOTE — ED Notes (Signed)
Pt ambulatory to exam room with steady gait. Pt with no acute distress. Pt holding cool compress to L eye.

## 2013-03-16 NOTE — ED Notes (Addendum)
Pt had allergic reaction yesterday to almonds and was seen and [facility], pt was not given meds but pt states she has not gotten better. Pt c/o swelling to left side of face. Pt was also diagnosed with conjunctivitis and told to get OTC eye drops.

## 2013-03-16 NOTE — ED Provider Notes (Signed)
CSN: [id number]     Arrival date & time 03/16/13  2033 History   None    This chart was scribed for non-physician practitioner, Ruby Cola PA-C working with No att. providers found by Arlan Organ, ED Scribe. This patient was seen in room WTR5/WTR5 and the patient's care was started at 10:29 PM.   Chief Complaint  Patient presents with  . Allergic Reaction  . Conjunctivitis   HPI  HPI Comments: Cassandra [name] is a 57 y.o. female who presents to the Emergency Department complaining of an allergic reaction that initially occurred yesterday afternoon. Pt states she had a reaction to almonds yesterday, and went to [facility] for treatment shortly after onset of symptoms. She reports swelling and itching of her lips and tongue, and mouth sores within minutes, but says she has consumed almonds in the past. She states she was not given any medication, but says her symptoms have no improved. Pt was also diagnosed with conjunctivitis related to allergies, and was advised to get OTC eye drops for treatment. She says her eye symptoms started around the same time as the initial swelling of her lips and tongue. She says she is experiencing vision changes in her left eye. She reports a similar episode some time ago with pecans and voltaren. She currently reports swelling and irritation to her upper lip and sores to the inside of her mouth. She has tried OTC Zyrtec and eye drops with mild relief. She denies any recent injury to her eye. She states she wears reading glasses only. She reports a h/o anxiety, depression, and GERD.   Past Medical History  Diagnosis Date  . Allergy   . Anxiety   . Depression   . GERD (gastroesophageal reflux disease)    Past Surgical History  Procedure Laterality Date  . Tonsillectomy  08/2002  . Carpal tunnel release Right 2002  . Tubal ligation  1980   Family History  Problem Relation Age of Onset  . Clotting disorder Mother 81    Blood clot, foot  amputation  . Arthritis Sister   . Heart attack Maternal Grandmother 51   History  Substance Use Topics  . Smoking status: Never Smoker   . Smokeless tobacco: Not on file  . Alcohol Use: No   OB History   Grav Para Term Preterm Abortions TAB SAB Ect Mult Living                 Review of Systems  All other systems reviewed and are negative.    Allergies  Ampicillin; Clarithromycin; Codeine; Effexor; Flagyl; Fluoxetine hcl; Levofloxacin; Myrbetriq; Nexium; Nsaids; Paxil; Sulfa antibiotics; and Tetracycline  Home Medications   Current Outpatient Rx  Name  Route  Sig  Dispense  Refill  . cetirizine (ZYRTEC) 10 MG tablet   Oral   Take 10 mg by mouth daily.         Marland Kitchen ketotifen (ZADITOR) 0.025 % ophthalmic solution      1 drop 2 (two) times daily.         Marland Kitchen omeprazole (PRILOSEC) 20 MG capsule   Oral   Take 20 mg by mouth daily.         . sertraline (ZOLOFT) 100 MG tablet   Oral   Take 100 mg by mouth at bedtime.          Triage Vitals: BP 133/83  Pulse 73  Temp(Src) 97.8 F (36.6 C) (Oral)  Resp 20  SpO2 98%  Physical  Exam  Nursing note and vitals reviewed. Constitutional: She is oriented to person, place, and time. She appears well-developed and well-nourished. No distress.  HENT:  Head: Normocephalic and atraumatic.  No edema of lips/tongue.  No lesions of skin or buccal mucosa.    Eyes:  Diffuse conjunctival injection of L eye.  Uniform; not more prominent at limbus.  Thin film of mucous visible across the cornea. PERRL.  EOMi.  No pain w/ ROM.  No photophobia.  No tenderness of globe or orbital bones.   VA 20/20 on R and 20/30 on L.  Neck: Normal range of motion.  Cardiovascular: Normal rate and regular rhythm.   Pulmonary/Chest: Effort normal and breath sounds normal. No respiratory distress.  Musculoskeletal: Normal range of motion.  Neurological: She is alert and oriented to person, place, and time.  Skin: Skin is warm and dry. No rash noted.   Psychiatric: She has a normal mood and affect. Her behavior is normal.    ED Course  Procedures (including critical care time)  DIAGNOSTIC STUDIES: Oxygen Saturation is 98% on RA, Normal by my interpretation.    COORDINATION OF CARE: 10:42 PM-Discussed treatment plan with pt at bedside and pt agreed to plan.     Labs Review Labs Reviewed - No data to display Imaging Review No results found.  EKG Interpretation   None       MDM   1. Conjunctivitis    Healthy 57yo F presents w/ multiple complaints.  Developed tingling, edema and mild pain of lips and tongue as well as throat tightness immediately after eating almonds yesterday afternoon.  Sx improved after taking benadryl.  Was evaluated to [facility] and no objective findings of allergic reaction on exam.  These sx continue to improve but she still has irritation of inner aspect of upper lip.  I suspect that this is self inflicted from scratching; no skin changes nor edema.  Recommended that she continue zyrtec and benadryl.  She received an epi-pen today as well (uninsured and can not afford; nursing staff gave instructions for administration).  Also c/o L eye injection/pruritis/discomfort that started at same time.  UCC diagnosed w/ allergic conjunctivitis and recommended OTC Zatidor.  Examined for corneal abrasion at that time.  Pt has not had relief w/ this medication and has since noticed mild visual impairment as well as purulent drainage.  Based on exam, suspect viral conjunctivitis, but because of reported purulent drainage as well as pain, will treat w/ polysporin ophtho.  Referred to ophtho for persistent sx.  Return precautions discussed.   I personally performed the services described in this documentation, which was scribed in my presence. The recorded information has been reviewed and is accurate.   Otilio Miu, PA-C 03/16/13 [phone]

## 2013-03-16 NOTE — ED Provider Notes (Signed)
Medical screening examination/treatment/procedure(s) were performed by non-physician practitioner and as supervising physician I was immediately available for consultation/collaboration.  EKG Interpretation   None         Rolan Bucco, MD 03/16/13 2352

## 2013-03-20 ENCOUNTER — Emergency Department (HOSPITAL_COMMUNITY)
Admission: EM | Admit: 2013-03-20 | Discharge: 2013-03-20 | Disposition: A | Discharge status: Home / Self Care | Payer: Self-pay | Source: Home / Self Care | Attending: Family Medicine | Admitting: Family Medicine

## 2013-03-20 ENCOUNTER — Encounter (HOSPITAL_COMMUNITY): Payer: Self-pay | Admitting: Emergency Medicine

## 2013-03-20 DIAGNOSIS — H15009 Unspecified scleritis, unspecified eye: Secondary | ICD-10-CM

## 2013-03-20 DIAGNOSIS — H15003 Unspecified scleritis, bilateral: Secondary | ICD-10-CM

## 2013-03-20 LAB — CBC
HCT: 41.3 % (ref 36.0–46.0)
Hemoglobin: 14.7 g/dL (ref 12.0–15.0)
MCH: 32.7 pg (ref 26.0–34.0)
MCHC: 35.6 g/dL (ref 30.0–36.0)
MCV: 91.8 fL (ref 78.0–100.0)
PLATELETS: 193 10*3/uL (ref 150–400)
RBC: 4.5 MIL/uL (ref 3.87–5.11)
RDW: 13.4 % (ref 11.5–15.5)
WBC: 6.2 10*3/uL (ref 4.0–10.5)

## 2013-03-20 LAB — SEDIMENTATION RATE: SED RATE: 19 mm/h (ref 0–22)

## 2013-03-20 MED ORDER — PREDNISONE 20 MG PO TABS: 80.0000 mg | ORAL_TABLET | Freq: Every day | ORAL | Status: DC

## 2013-03-20 MED ORDER — ERYTHROMYCIN 5 MG/GM OP OINT: TOPICAL_OINTMENT | OPHTHALMIC | Status: DC

## 2013-03-20 MED ORDER — ARTIFICIAL TEARS OP OINT: TOPICAL_OINTMENT | OPHTHALMIC | Status: DC | PRN

## 2013-03-20 NOTE — ED Provider Notes (Addendum)
Cassandra [name] is a 58 y.o. female who presents to Urgent Care today for eye irritation. Patient has had bilateral eye irritation now for the past 6 days. Her symptoms developed on December 30 and were associated with some mouth irritation after eating almonds. She was thought to have allergic conjunctivitis at that point. She was started on Zaditor eye drops. However the next day when her symptoms were worsening she was seen in the emergency room and treated for bacterial conjunctivitis with Polysporin ophthalmic ointment. Her symptoms have steadily worsened since then. Additionally she notes irritation in and around her mouth. She denies any fevers or chills or new medications. Her medications listed below are up-to-date. She denies any significant blurry vision nausea vomiting or diarrhea. 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 Unfortunately she does not have health insurance.     Past Medical History  Diagnosis Date  . Allergy   . Anxiety   . Depression   . GERD (gastroesophageal reflux disease)    History  Substance Use Topics  . Smoking status: Never Smoker   . Smokeless tobacco: Not on file  . Alcohol Use: No   ROS as above Medications reviewed. No current facility-administered medications for this encounter.   Current Outpatient Prescriptions  Medication Sig Dispense Refill  . bacitracin-polymyxin b (POLYSPORIN) ophthalmic ointment Place 1 application into the left eye every 12 (twelve) hours. apply to L eye every 12 hours while awake  3.5 g  0  . DiphenhydrAMINE HCl (BENADRYL PO) Take by mouth.      Marland Kitchen ketotifen (ZADITOR) 0.025 % ophthalmic solution 1 drop 2 (two) times daily.      Marland Kitchen omeprazole (PRILOSEC) 20 MG capsule Take 20 mg by mouth daily.      . sertraline (ZOLOFT) 100 MG tablet Take 100 mg by mouth at bedtime.      Marland Kitchen artificial tears (LACRILUBE) OINT ophthalmic ointment  Place into both eyes every 4 (four) hours as needed for dry eyes.  3.5 g  1  . erythromycin ophthalmic ointment Place a 1/2 inch ribbon of ointment into the lower eyelid. q6 hr  3.5 g  0  . predniSONE (DELTASONE) 20 MG tablet Take 4 tablets (80 mg total) by mouth daily.  120 tablet  0  . [DISCONTINUED] cetirizine (ZYRTEC) 10 MG tablet Take 10 mg by mouth daily.        Exam:  BP 125/67  Pulse 78  Temp(Src) 97.5 F (36.4 C) (Oral)  Resp 18  SpO2 98% Gen: Well NAD HEENT: EOMI,  MMM, PERRLA bilateral conjunctiva are inflamed with significant conjunctival injection. The cornea are clear bilaterally.  On slitlamp exam there is no fluid or clouding of the anterior chamber. No nodular or cystic lesions are seen on the sclera.  Funduscopic exam bilaterally is normal.  Lungs: Normal work of breathing. CTABL Heart: RRR no MRG Abd: NABS, Soft. NT, ND Exts: Non edematous BL  LE, warm and well perfused.   Visual acuity right eye 20/25, left eye 20/50, both eyes 20/25.  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 The diagnosis at this time is somewhat unclear. Patient may be having allergic reaction to the sulfa in the Polysporin ophthalmic ointment or this may be some sort of vasculitis or scleritis. I am doubtful of uveitis or iritis based on the slitlamp exam today. I am obtaining labs, CBC, sedimentation  rate, ANA which may help with diagnosis.  Additionally I recommend stopping the Polysporin ointment and using Lacri-Lube ophthalmic ointment as needed. Will switch to erythromycin ointment which she has used successfully in the past. Additionally I will initiate treatment with high-dose oral prednisone 1 mg per kilogram with 80 mg per day max.  Additionally I recommend patient followup with an ophthalmologist as soon as possible and I provided her contact information with one. If she is not improving rapidly she is to present directly to the emergency room as soon as  possible. I warned her about the possibility of going blind if she is unable to followup. Discussed warning signs or symptoms. Please see discharge instructions. Patient expresses understanding.      Gregor Hams, MD 03/20/13 [place] Masai Kidd, MD 03/20/13 [place] Koda Defrank, MD 03/20/13 1020  CBC and ESR both normal. ANA pending.    Gregor Hams, MD 03/20/13 [phone]

## 2013-03-20 NOTE — Discharge Instructions (Signed)
Thank you for coming in today. Take prednisone 4 tablets daily for 4 weeks.  You will need a long taper on this medicine when you stop.  Please stop polysporin ointment.  Start erythromycin ointment 4 times daily Use Lacri-Lube topically as needed. You have to followup with an eye doctor for this. I provided the information for Dr. Burgess Estelleanner. If you cannot be seen by him you must followup with your primary care Dr. If you are not getting better or go directly to the emergency room.  Scleritis and Episcleritis The outer part of the eyeball is covered with a tough fibrous covering called the sclera. It is the white part of the eye. This tough covering also has a thin membrane lying on top of it called the episclera.   When the sclera becomes red and sore (inflamed), it is called scleritis.  When the episclera becomes inflamed, it is called episcleritis. CAUSES   Scleritis is usually more severe and is associated with autoimmune diseases such as:  Rheumatoid arthritis.  Inflammations of the bowel such as Crohn's Disease (regional enteritis).  Ulcerative colitis.  Episcleritis usually has no known cause. SYMPTOMS  Both scleritis and episcleritis cause red patches or a nodule on the eye. DIAGNOSIS  This condition should be examined by an ophthalmologist. This is because very strong medications that have side effects to the body and eye may be required to treat severe attacks. Further investigations into the patient's general health may be necessary. TREATMENT   Episcleritis tends to get better without treatment within a week or two.  Scleritis is more severe. Often, your caregiver will prescribe steroids by mouth (orally) or as drops in the eye. This treatment helps lessen the redness and soreness (inflammation). HOME CARE INSTRUCTIONS   Take all medications as directed.  Keep your follow-up appointments as directed.  Avoid irritation of the involved eye(s).  Stop using hard or  soft contact lenses until your caregiver tells you that it is safe to use them. SEEK MEDICAL CARE IF:   Redness or irritation gets worse.  You develop pain or sensitivity to light.  You develop any change in vision in the involved eye(s). Document Released: 02/25/2001 Document Revised: 05/26/2011 Document Reviewed: 06/29/2008 [facility] Patient Information 2014 [place], [place].

## 2013-03-20 NOTE — ED Notes (Signed)
States was seen in ED last week for left eye irritation and redness - was told to take OTC ketotifen drops; left eye had "film completely covering it" on Wed - went to ED and was given Rx for Bacitracin-Polymxin.  States getting worse, and now in both eyes.  C/O burning, tearing, waking up with matted eyes.  Started with oral lesions yesterday.

## 2013-03-21 ENCOUNTER — Telehealth (HOSPITAL_COMMUNITY): Payer: Self-pay | Admitting: Family Medicine

## 2013-03-21 LAB — ANTI-NUCLEAR AB-TITER (ANA TITER): ANA Titer 1: 1:640 {titer} — ABNORMAL HIGH

## 2013-03-21 LAB — ANA: Anti Nuclear Antibody(ANA): POSITIVE — AB

## 2013-03-21 NOTE — ED Notes (Signed)
Patient's ANA is very positive 1:640. Called a left a message to call back about lab results.  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   Rodolph BongEvan S Corey, MD 03/21/13 [id number]

## 2013-03-25 NOTE — ED Notes (Signed)
Lab questions

## 2013-03-29 ENCOUNTER — Telehealth (HOSPITAL_COMMUNITY): Payer: Self-pay | Admitting: *Deleted

## 2013-03-29 NOTE — ED Notes (Addendum)
Message from Dr. Denyse Amassorey to keep trying to contact pt. to come back. If unable to reach pt. by phone then to send a letter. I called pt. and left a message to call. Call 2. Vassie MoselleYork, Charlie Char M 03/29/2013 Call 3. 03/30/2013 I contacted pt. today and told her what Dr. Denyse Amassorey said.  She said she has insurance now and [facility] [place] [facility] [place].  I recommended she come and get a copy of her chart and lab results to take to them. 04/01/2013

## 2013-04-26 ENCOUNTER — Ambulatory Visit (INDEPENDENT_AMBULATORY_CARE_PROVIDER_SITE_OTHER): Payer: BC Managed Care – PPO | Admitting: Family Medicine

## 2013-04-26 ENCOUNTER — Ambulatory Visit
Admission: RE | Admit: 2013-04-26 | Discharge: 2013-04-26 | Disposition: A | Discharge status: Home / Self Care | Payer: BC Managed Care – PPO | Source: Ambulatory Visit | Attending: Family Medicine | Admitting: Family Medicine

## 2013-04-26 ENCOUNTER — Encounter: Payer: Self-pay | Admitting: Family Medicine

## 2013-04-26 VITALS — BP 122/66 | HR 82 | Temp 98.5°F | Resp 18 | Ht 63.0 in | Wt 227.0 lb

## 2013-04-26 DIAGNOSIS — R091 Pleurisy: Secondary | ICD-10-CM

## 2013-04-26 DIAGNOSIS — R894 Abnormal immunological findings in specimens from other organs, systems and tissues: Secondary | ICD-10-CM

## 2013-04-26 DIAGNOSIS — R768 Other specified abnormal immunological findings in serum: Secondary | ICD-10-CM

## 2013-04-26 LAB — CBC WITH DIFFERENTIAL/PLATELET
BASOS PCT: 0 % (ref 0–1)
Basophils Absolute: 0 10*3/uL (ref 0.0–0.1)
EOS ABS: 0.2 10*3/uL (ref 0.0–0.7)
Eosinophils Relative: 3 % (ref 0–5)
HCT: 34.1 % — ABNORMAL LOW (ref 36.0–46.0)
Hemoglobin: 11.4 g/dL — ABNORMAL LOW (ref 12.0–15.0)
Lymphocytes Relative: 21 % (ref 12–46)
Lymphs Abs: 1.6 10*3/uL (ref 0.7–4.0)
MCH: 30.2 pg (ref 26.0–34.0)
MCHC: 33.4 g/dL (ref 30.0–36.0)
MCV: 90.2 fL (ref 78.0–100.0)
Monocytes Absolute: 0.7 10*3/uL (ref 0.1–1.0)
Monocytes Relative: 9 % (ref 3–12)
NEUTROS ABS: 5 10*3/uL (ref 1.7–7.7)
NEUTROS PCT: 67 % (ref 43–77)
PLATELETS: 415 10*3/uL — AB (ref 150–400)
RBC: 3.78 MIL/uL — AB (ref 3.87–5.11)
RDW: 13.3 % (ref 11.5–15.5)
WBC: 7.5 10*3/uL (ref 4.0–10.5)

## 2013-04-26 LAB — COMPLETE METABOLIC PANEL WITH GFR
ALT: 26 U/L (ref 0–35)
AST: 22 U/L (ref 0–37)
Albumin: 3.4 g/dL — ABNORMAL LOW (ref 3.5–5.2)
Alkaline Phosphatase: 96 U/L (ref 39–117)
BILIRUBIN TOTAL: 0.5 mg/dL (ref 0.2–1.2)
BUN: 9 mg/dL (ref 6–23)
CALCIUM: 8.8 mg/dL (ref 8.4–10.5)
CO2: 29 meq/L (ref 19–32)
CREATININE: 0.63 mg/dL (ref 0.50–1.10)
Chloride: 101 mEq/L (ref 96–112)
GFR, Est Non African American: >89 mL/min
GLUCOSE: 108 mg/dL — AB (ref 70–99)
Potassium: 4.5 mEq/L (ref 3.5–5.3)
Sodium: 140 mEq/L (ref 135–145)
TOTAL PROTEIN: 6.9 g/dL (ref 6.0–8.3)

## 2013-04-26 NOTE — Progress Notes (Signed)
Subjective:    Patient ID: Cassandra [name], female    DOB: 11-29-1955, 58 y.o.   MRN: [id number]  HPI I saw patient in 02/2009 and saw the patient with pleurisy and bronchitis, she was treated with antibiotics and developed a rash on her arms, legs and torso that was characterized by 5-6 mm blanchable macules.  Biopsy revealed leukocytoclastic vasculitis.  Labs at that time were ANA negative, ESR of 44, P-ANCA 1:160, MPO abs 27.5, PR-3 abs 4.9.  She was referred to pulmonary and renal who recommended regular follow up bu the patient was lost to follow up due to lack of insurance.  Since that time, she has had several episodes of cough and pleurisy.  The most recent began 3 weeks ago.  It is 70% better.  She denies any hemoptysis, fever, chills, or DOE.  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  She is here today for follow up.  She reports frequent arthritic pains in her PIP and DIP joints. She has bilateral knee pain. She denies any gastrointestinal symptoms. She has had no further rash since I last saw the patient in 2010. She does have frequent bouts of "bronchitis".   Past Medical History  Diagnosis Date  . Allergy   . Anxiety   . Depression   . GERD (gastroesophageal reflux disease)    Past Surgical History  Procedure Laterality Date  . Tonsillectomy  08/2002  . Carpal tunnel release Right 2002  . Tubal ligation  1980   Current Outpatient Prescriptions on File Prior to Visit  Medication Sig Dispense Refill  . DiphenhydrAMINE HCl (BENADRYL PO) Take by mouth.      Marland Kitchen omeprazole (PRILOSEC) 20 MG capsule Take 20 mg by mouth daily.      . sertraline (ZOLOFT) 100 MG tablet Take 100 mg by mouth at bedtime.       No current facility-administered medications on file prior to visit.   Allergies  Allergen Reactions  . Ampicillin   . Clarithromycin   . Codeine   . Effexor [Venlafaxine]   . Flagyl [Metronidazole]   .  Fluoxetine Hcl Itching    Deep Itch   . Levofloxacin   . Myrbetriq [Mirabegron]     Caused back pain  . Nexium [Esomeprazole Magnesium] Swelling    Swelling of tongue and sores in mouth  . Nsaids Other (See Comments)    Hypersensitivity vasculitis  . Paxil [Paroxetine]   . Sulfa Antibiotics     Stomach irritation  . Tetracycline     Severe stomach pain   History   Social History  . Marital Status: Divorced    Spouse Name: N/A    Number of Children: N/A  . Years of Education: N/A   Occupational History  . Not on file.   Social History Main Topics  . Smoking status: Never Smoker   . Smokeless tobacco: Not on file  . Alcohol Use: No  . Drug Use: No  . Sexual Activity: Not on file   Other Topics Concern  . Not on file   Social History Narrative  . No narrative on file      Review of Systems  All other systems reviewed and are negative.       Objective:   Physical Exam  Vitals reviewed. Constitutional: She is oriented to person, place, and time. She appears well-developed and well-nourished. No distress.  HENT:  Right  Ear: External ear normal.  Left Ear: External ear normal.  Nose: Nose normal.  Mouth/Throat: Oropharynx is clear and moist. No oropharyngeal exudate.  Eyes: Conjunctivae are normal. No scleral icterus.  Neck: Neck supple. No thyromegaly present.  Cardiovascular: Normal rate, regular rhythm and normal heart sounds.  Exam reveals no gallop and no friction rub.   No murmur heard. Pulmonary/Chest: Effort normal and breath sounds normal. No respiratory distress. She has no wheezes. She has no rales. She exhibits no tenderness.  Abdominal: Soft. Bowel sounds are normal. She exhibits no distension. There is no tenderness. There is no rebound and no guarding.  Musculoskeletal: She exhibits no edema.  Lymphadenopathy:    She has no cervical adenopathy.  Neurological: She is alert and oriented to person, place, and time. No cranial nerve deficit. She  exhibits normal muscle tone. Coordination normal.  Skin: No rash noted. She is not diaphoretic. No erythema.          Assessment & Plan:  ANA positive - Plan: COMPLETE METABOLIC PANEL WITH GFR, CBC with Differential, Sedimentation rate, ANCA screen with reflex titer, Ambulatory referral to Rheumatology  Pleurisy - Plan: DG Chest 2 View  The differential diagnosis for the patient's pleurisy is a viral upper respiratory infection, pleurisy due to autoimmune disease, or pleurisy due to infection. I will check a sedimentation rate as well as a chest x-ray. If the chest x-ray shows evidence of infection, I will treat with antibiotics. However, if the chest x-ray is normal and the sedimentation rate is elevated, I believe the patient would benefit for treatment of her pleurisy with oral steroids.  If the chest x-ray and sedimentation rate are normal, I recommend tincture of time as this may be a viral upper respiratory infection. I do believe the patient has an underlying autoimmune problem whether the lupus or vasculitis. I recommended a rheumatology consultation to help decipher the exact nature of her autoimmune problem so that we may better be able to treat her exacerbations in the future.

## 2013-04-27 LAB — SEDIMENTATION RATE: SED RATE: 121 mm/h — AB (ref 0–22)

## 2013-04-28 ENCOUNTER — Other Ambulatory Visit: Payer: Self-pay | Admitting: Family Medicine

## 2013-04-28 LAB — ANCA TITERS: P-ANCA: 1:80 {titer} — ABNORMAL HIGH

## 2013-04-28 LAB — ANCA SCREEN W REFLEX TITER
Atypical p-ANCA Screen: NEGATIVE
C-ANCA SCREEN: NEGATIVE
p-ANCA Screen: POSITIVE — AB

## 2013-04-28 MED ORDER — BENZONATATE 200 MG PO CAPS: 200.0000 mg | ORAL_CAPSULE | Freq: Three times a day (TID) | ORAL | Status: DC | PRN

## 2013-04-29 ENCOUNTER — Other Ambulatory Visit: Payer: Self-pay | Admitting: Family Medicine

## 2013-04-29 ENCOUNTER — Telehealth: Payer: Self-pay | Admitting: Physician Assistant

## 2013-04-29 ENCOUNTER — Encounter: Payer: Self-pay | Admitting: Family Medicine

## 2013-04-29 MED ORDER — PREDNISONE 20 MG PO TABS: ORAL_TABLET | ORAL | Status: DC

## 2013-04-29 MED ORDER — BENZONATATE 200 MG PO CAPS: 200.0000 mg | ORAL_CAPSULE | Freq: Three times a day (TID) | ORAL | Status: DC | PRN

## 2013-04-29 NOTE — Telephone Encounter (Signed)
Here is message

## 2013-04-29 NOTE — Telephone Encounter (Signed)
Pt states someone was suppose to calling in Prednisone and the little pearl size pills for coughing [facility] [place] Call back number is [phone]

## 2013-04-29 NOTE — Telephone Encounter (Signed)
meds sent to pharmacy

## 2013-05-13 ENCOUNTER — Encounter: Payer: Self-pay | Admitting: Family Medicine

## 2013-05-13 ENCOUNTER — Ambulatory Visit (INDEPENDENT_AMBULATORY_CARE_PROVIDER_SITE_OTHER): Payer: BC Managed Care – PPO | Admitting: Family Medicine

## 2013-05-13 VITALS — BP 118/64 | HR 110 | Temp 99.5°F | Resp 26 | Ht 63.0 in | Wt 226.0 lb

## 2013-05-13 DIAGNOSIS — R509 Fever, unspecified: Secondary | ICD-10-CM

## 2013-05-13 DIAGNOSIS — R091 Pleurisy: Secondary | ICD-10-CM

## 2013-05-13 DIAGNOSIS — R894 Abnormal immunological findings in specimens from other organs, systems and tissues: Secondary | ICD-10-CM

## 2013-05-13 DIAGNOSIS — R768 Other specified abnormal immunological findings in serum: Secondary | ICD-10-CM

## 2013-05-13 LAB — INFLUENZA A AND B
INFLUENZA A AG: NEGATIVE
INFLUENZA B AG: NEGATIVE

## 2013-05-13 MED ORDER — PREDNISONE 20 MG PO TABS: 20.0000 mg | ORAL_TABLET | Freq: Every day | ORAL | Status: DC

## 2013-05-13 NOTE — Progress Notes (Signed)
Subjective:    Patient ID: Cassandra [name], female    DOB: [birth year], 58 y.o.   MRN: [id number]  HPI 04/26/13 I saw patient in 02/2009 and saw the patient with pleurisy and bronchitis, she was treated with antibiotics and developed a rash on her arms, legs and torso that was characterized by 5-6 mm blanchable macules.  Biopsy revealed leukocytoclastic vasculitis.  Labs at that time were ANA negative, ESR of 44, P-ANCA 1:160, MPO abs 27.5, PR-3 abs 4.9.  She was referred to pulmonary and renal who recommended regular follow up bu the patient was lost to follow up due to lack of insurance.  Since that time, she has had several episodes of cough and pleurisy.  The most recent began 3 weeks ago.  It is 70% better.  She denies any hemoptysis, fever, chills, or DOE.  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  She is here today for follow up.  She reports frequent arthritic pains in her PIP and DIP joints. She has bilateral knee pain. She denies any gastrointestinal symptoms. She has had no further rash since I last saw the patient in 2010. She does have frequent bouts of "bronchitis".  At that time, my plan was:  The differential diagnosis for the patient's pleurisy is a viral upper respiratory infection, pleurisy due to autoimmune disease, or pleurisy due to infection. I will check a sedimentation rate as well as a chest x-ray. If the chest x-ray shows evidence of infection, I will treat with antibiotics. However, if the chest x-ray is normal and the sedimentation rate is elevated, I believe the patient would benefit for treatment of her pleurisy with oral steroids.  If the chest x-ray and sedimentation rate are normal, I recommend tincture of time as this may be a viral upper respiratory infection. I do believe the patient has an underlying autoimmune problem whether the lupus or vasculitis. I recommended a rheumatology consultation to help  decipher the exact nature of her autoimmune problem so that we may better be able to treat her exacerbations in the future.   The patient's chest x-ray was negative for infection or effusion.  Her most recent lab work is listed below: Office Visit on 05/13/2013  Component Date Value Ref Range Status  . Source-INFBD 05/13/2013 NASAL   Final  . Inflenza A Ag 05/13/2013 NEG  Negative Final  . Influenza B Ag 05/13/2013 NEG  Negative Final  Office Visit on 04/26/2013  Component Date Value Ref Range Status  . Sodium 04/26/2013 140  135 - 145 mEq/L Final  . Potassium 04/26/2013 4.5  3.5 - 5.3 mEq/L Final  . Chloride 04/26/2013 101  96 - 112 mEq/L Final  . CO2 04/26/2013 29  19 - 32 mEq/L Final  . Glucose, Bld 04/26/2013 108* 70 - 99 mg/dL Final  . BUN 04/26/2013 9  6 - 23 mg/dL Final  . Creat 04/26/2013 0.63  0.50 - 1.10 mg/dL Final  . Total Bilirubin 04/26/2013 0.5  0.2 - 1.2 mg/dL Final   ** Please note change in reference range(s). **  . Alkaline Phosphatase 04/26/2013 96  39 - 117 U/L Final  . AST 04/26/2013 22  0 - 37 U/L Final  . ALT 04/26/2013 26  0 - 35 U/L Final  . Total Protein 04/26/2013 6.9  6.0 - 8.3 g/dL Final  . Albumin 04/26/2013 3.4* 3.5 - 5.2 g/dL Final  . Calcium  04/26/2013 8.8  8.4 - 10.5 mg/dL Final  . GFR, Est African American 04/26/2013 >89   Final  . GFR, Est Non African American 04/26/2013 >89   Final   Comment:                            The estimated GFR is a calculation valid for adults (>=67 years old)                          that uses the CKD-EPI algorithm to adjust for age and sex. It is                            not to be used for children, pregnant women, hospitalized patients,                             patients on dialysis, or with rapidly changing kidney function.                          According to the NKDEP, eGFR >89 is normal, 60-89 shows mild                          impairment, 30-59 shows moderate impairment, 15-29 shows severe                           impairment and <15 is ESRD.                             . WBC 04/26/2013 7.5  4.0 - 10.5 K/uL Final  . RBC 04/26/2013 3.78* 3.87 - 5.11 MIL/uL Final  . Hemoglobin 04/26/2013 11.4* 12.0 - 15.0 g/dL Final  . HCT 04/26/2013 34.1* 36.0 - 46.0 % Final  . MCV 04/26/2013 90.2  78.0 - 100.0 fL Final  . MCH 04/26/2013 30.2  26.0 - 34.0 pg Final  . MCHC 04/26/2013 33.4  30.0 - 36.0 g/dL Final  . RDW 04/26/2013 13.3  11.5 - 15.5 % Final  . Platelets 04/26/2013 415* 150 - 400 K/uL Final  . Neutrophils Relative % 04/26/2013 67  43 - 77 % Final  . Neutro Abs 04/26/2013 5.0  1.7 - 7.7 K/uL Final  . Lymphocytes Relative 04/26/2013 21  12 - 46 % Final  . Lymphs Abs 04/26/2013 1.6  0.7 - 4.0 K/uL Final  . Monocytes Relative 04/26/2013 9  3 - 12 % Final  . Monocytes Absolute 04/26/2013 0.7  0.1 - 1.0 K/uL Final  . Eosinophils Relative 04/26/2013 3  0 - 5 % Final  . Eosinophils Absolute 04/26/2013 0.2  0.0 - 0.7 K/uL Final  . Basophils Relative 04/26/2013 0  0 - 1 % Final  . Basophils Absolute 04/26/2013 0.0  0.0 - 0.1 K/uL Final  . Smear Review 04/26/2013 Criteria for review not met   Final  . Sed Rate 04/26/2013 121* 0 - 22 mm/hr Final  . c-ANCA Screen 04/26/2013 NEGATIVE  NEGATIVE Final  . p-ANCA Screen 04/26/2013 POSITIVE* NEGATIVE Final  . Atypical p-ANCA Screen 04/26/2013 NEGATIVE  NEGATIVE Final   Comment: ANCA Screen includes evaluation for p-ANCA, c-ANCA and Atypical  p-ANCA.  Marland Kitchen C-ANCA 24/11/7351 NOT APPLICABLE  <2:99 Final  . P-ANCA 04/26/2013 1:80* <1:20 Final   Comment:                                                                                                 The p-ANCA pattern and autoantibodies to myeloperoxidase (MPO) are                          commonly associated with microscopic polyangiitis, Churg-Strauss                          syndrome, and idiopathic, pauci-immune, crescentic and necrotizing                          glomerulonephritis.  The  fluorescence pattern of fine, homogeneous                          rim-like cytoplasmic perinuclear staining is based on antibody to MPO.                          These autoantibodies are clinically helpful in determining active                          versus inactive disease states, in monitoring the effect of therapy,                          and in evaluating the possibility of relapse in microscopic                          polyangiitis and its renal-limited variant.  . Atypical P-ANCA titer 24/26/8341 NOT APPLICABLE  <9:62 Final   her sedimentation rate was extremely elevated.  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  Patient's symptoms completely resolved in one week. Shortly after discontinuing the prednisone and, her cough and pleurisy has resumed. She is now having mild shortness of breath. She is having pleurisy in the left chest and in the right chest. The cough is productive of clear sputum. She denies any hemoptysis. She does have a low-grade fever. She is also having diffuse myalgias and arthralgias in her shoulders hips knees and hands. Flu test today in the office is negative.  Past Medical History  Diagnosis Date  . Allergy   . Anxiety   . Depression   . GERD (gastroesophageal reflux disease)    Past Surgical History  Procedure Laterality Date  . Tonsillectomy  08/2002  . Carpal tunnel release Right 2002  . Tubal ligation  1980   Current Outpatient Prescriptions on File Prior to Visit  Medication Sig Dispense Refill  . cetirizine (ZYRTEC) 10 MG tablet Take 10 mg by mouth daily.      Marland Kitchen omeprazole (PRILOSEC) 20 MG capsule Take 20 mg by mouth  daily.      . sertraline (ZOLOFT) 100 MG tablet Take 100 mg by mouth at bedtime.      . DiphenhydrAMINE HCl (BENADRYL PO) Take by mouth.       No current facility-administered medications on file prior to visit.   Allergies  Allergen Reactions  . Ampicillin   . Clarithromycin   .  Codeine   . Effexor [Venlafaxine]   . Flagyl [Metronidazole]   . Fluoxetine Hcl Itching    Deep Itch   . Levofloxacin   . Myrbetriq [Mirabegron]     Caused back pain  . Nexium [Esomeprazole Magnesium] Swelling    Swelling of tongue and sores in mouth  . Nsaids Other (See Comments)    Hypersensitivity vasculitis  . Paxil [Paroxetine]   . Sulfa Antibiotics     Stomach irritation  . Tetracycline     Severe stomach pain   History   Social History  . Marital Status: Divorced    Spouse Name: N/A    Number of Children: N/A  . Years of Education: N/A   Occupational History  . Not on file.   Social History Main Topics  . Smoking status: Never Smoker   . Smokeless tobacco: Not on file  . Alcohol Use: No  . Drug Use: No  . Sexual Activity: Not on file   Other Topics Concern  . Not on file   Social History Narrative  . No narrative on file      Review of Systems  All other systems reviewed and are negative.       Objective:   Physical Exam  Vitals reviewed. Constitutional: She is oriented to person, place, and time. She appears well-developed and well-nourished. No distress.  HENT:  Right Ear: External ear normal.  Left Ear: External ear normal.  Nose: Nose normal.  Mouth/Throat: Oropharynx is clear and moist. No oropharyngeal exudate.  Eyes: Conjunctivae are normal. No scleral icterus.  Neck: Neck supple. No thyromegaly present.  Cardiovascular: Normal rate, regular rhythm and normal heart sounds.  Exam reveals no gallop and no friction rub.   No murmur heard. Pulmonary/Chest: Effort normal and breath sounds normal. No respiratory distress. She has no wheezes. She has no rales. She exhibits no tenderness.  Abdominal: Soft. Bowel sounds are normal. She exhibits no distension. There is no tenderness. There is no rebound and no guarding.  Musculoskeletal: She exhibits no edema.  Lymphadenopathy:    She has no cervical adenopathy.  Neurological: She is alert and  oriented to person, place, and time. No cranial nerve deficit. She exhibits normal muscle tone. Coordination normal.  Skin: No rash noted. She is not diaphoretic. No erythema.          Assessment & Plan:  Fever - Plan: Influenza a and b  Pleurisy - Plan: predniSONE (DELTASONE) 20 MG tablet  ANA positive - Plan: C3 and C4, Anti-DNA antibody, double-stranded  I believe the patient likely has lupus and worsening pulmonary manifestations of this condition along with myalgias and arthralgias.  I recommended starting the patient on prednisone 20 mg by mouth daily and I will see her back next week. If her symptoms are improving I will try to wean the patient down to the lowest effective dose of prednisone. I will also try to expedite her referral to a rheumatologist. I will check her complement levels and anti-double-stranded DNA test to confirm my suspicions.  I instructed the patient to seek medical attention immediately if her symptoms worsen.

## 2013-05-14 LAB — C3 AND C4
C3 COMPLEMENT: 189 mg/dL — AB (ref 90–180)
C4 COMPLEMENT: 27 mg/dL (ref 10–40)

## 2013-05-16 LAB — ANTI-DNA ANTIBODY, DOUBLE-STRANDED: DS DNA AB: 1 [IU]/mL

## 2013-05-18 ENCOUNTER — Other Ambulatory Visit: Payer: Self-pay | Admitting: Family Medicine

## 2013-05-18 DIAGNOSIS — R053 Chronic cough: Secondary | ICD-10-CM

## 2013-05-18 DIAGNOSIS — R05 Cough: Secondary | ICD-10-CM

## 2013-05-20 ENCOUNTER — Ambulatory Visit (INDEPENDENT_AMBULATORY_CARE_PROVIDER_SITE_OTHER): Payer: BC Managed Care – PPO | Admitting: Family Medicine

## 2013-05-20 ENCOUNTER — Encounter: Payer: Self-pay | Admitting: Family Medicine

## 2013-05-20 VITALS — BP 100/64 | HR 80 | Temp 98.3°F | Resp 18 | Ht 63.0 in | Wt 224.0 lb

## 2013-05-20 DIAGNOSIS — I776 Arteritis, unspecified: Secondary | ICD-10-CM

## 2013-05-20 MED ORDER — SERTRALINE HCL 100 MG PO TABS: 100.0000 mg | ORAL_TABLET | Freq: Every day | ORAL | Status: DC

## 2013-05-20 NOTE — Progress Notes (Signed)
 Subjective:    Patient ID: Cassandra [name], female    DOB: 04/04/1955, 57 y.o.   MRN: [id number]  HPI 04/26/13 I saw patient in 02/2009 and saw the patient with pleurisy and bronchitis, she was treated with antibiotics and developed a rash on her arms, legs and torso that was characterized by 5-6 mm blanchable macules.  Biopsy revealed leukocytoclastic vasculitis.  Labs at that time were ANA negative, ESR of 44, P-ANCA 1:160, MPO abs 27.5, PR-3 abs 4.9.  She was referred to pulmonary and renal who recommended regular follow up bu the patient was lost to follow up due to lack of insurance.  Since that time, she has had several episodes of cough and pleurisy.  The most recent began 3 weeks ago.  It is 70% better.  She denies any hemoptysis, fever, chills, or DOE.  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  She is here today for follow up.  She reports frequent arthritic pains in her PIP and DIP joints. She has bilateral knee pain. She denies any gastrointestinal symptoms. She has had no further rash since I last saw the patient in 2010. She does have frequent bouts of "bronchitis".  At that time, my plan was:  The differential diagnosis for the patient's pleurisy is a viral upper respiratory infection, pleurisy due to autoimmune disease, or pleurisy due to infection. I will check a sedimentation rate as well as a chest x-ray. If the chest x-ray shows evidence of infection, I will treat with antibiotics. However, if the chest x-ray is normal and the sedimentation rate is elevated, I believe the patient would benefit for treatment of her pleurisy with oral steroids.  If the chest x-ray and sedimentation rate are normal, I recommend tincture of time as this may be a viral upper respiratory infection. I do believe the patient has an underlying autoimmune problem whether the lupus or vasculitis. I recommended a rheumatology consultation to help  decipher the exact nature of her autoimmune problem so that we may better be able to treat her exacerbations in the future.   The patient's chest x-ray was negative for infection or effusion.  Her sedimentation rate was extremely elevated.  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  Patient's symptoms completely resolved in one week. Shortly after discontinuing the prednisone, her cough and pleurisy has resumed. She is now having mild shortness of breath. She is having pleurisy in the left chest and in the right chest. The cough is productive of clear sputum. She denies any hemoptysis. She does have a low-grade fever. She is also having diffuse myalgias and arthralgias in her shoulders hips knees and hands. Flu test today in the office is negative.  At that time, my plan was:   I believe the patient likely has lupus and worsening pulmonary manifestations of this condition along with myalgias and arthralgias.  I recommended starting the patient on prednisone 20 mg by mouth daily and I will see her back next week. If her symptoms are improving I will try to wean the patient down to the lowest effective dose of prednisone. I will also try to expedite her referral to a rheumatologist. I will check her complement levels and anti-double-stranded DNA test to confirm my suspicions.  I instructed the patient to seek medical attention immediately if her symptoms worsen.   Office Visit on 05/13/2013  Component Date   Value Ref Range Status  . Source-INFBD 05/13/2013 NASAL   Final  . Inflenza A Ag 05/13/2013 NEG  Negative Final  . Influenza B Ag 05/13/2013 NEG  Negative Final  . C3 Complement 05/13/2013 189* 90 - 180 mg/dL Final  . C4 Complement 05/13/2013 27  10 - 40 mg/dL Final  . ds DNA Ab 05/13/2013 1   Final   Comment:                                                          IU/mL       Interpretation                                                        < or = 4     Negative                                                        5-9         Indeterminate                                                        > or = 10   Positive                               05/20/13 The confirmatory tests for lupus returned normal. Fortunately her cough improved 90% on prednisone 20 mg by mouth daily. The patient feels well today on immunosuppressive therapy. Dr. Melvyn Novas with pulmonary is working the patient in next week to be evaluated for pulmonary vasculitis.     Past Medical History  Diagnosis Date  . Allergy   . Anxiety   . Depression   . GERD (gastroesophageal reflux disease)    Past Surgical History  Procedure Laterality Date  . Tonsillectomy  08/2002  . Carpal tunnel release Right 2002  . Tubal ligation  1980   Current Outpatient Prescriptions on File Prior to Visit  Medication Sig Dispense Refill  . cetirizine (ZYRTEC) 10 MG tablet Take 10 mg by mouth daily.      . Cholecalciferol (VITAMIN D-3) 5000 UNITS TABS Take by mouth.      . DiphenhydrAMINE HCl (BENADRYL PO) Take by mouth.      Marland Kitchen omeprazole (PRILOSEC) 20 MG capsule Take 20 mg by mouth daily.      . predniSONE (DELTASONE) 20 MG tablet Take 1 tablet (20 mg total) by mouth daily with breakfast.  14 tablet  0  . sertraline (ZOLOFT) 100 MG tablet Take 100 mg by mouth at bedtime.       No current facility-administered medications on file prior to visit.   Allergies  Allergen Reactions  . Ampicillin   . Clarithromycin   .  Codeine   . Effexor [Venlafaxine]   . Flagyl [Metronidazole]   . Fluoxetine Hcl Itching    Deep Itch   . Levofloxacin   . Myrbetriq [Mirabegron]     Caused back pain  . Nexium [Esomeprazole Magnesium] Swelling    Swelling of tongue and sores in mouth  . Nsaids Other (See Comments)    Hypersensitivity vasculitis  . Paxil [Paroxetine]   . Sulfa Antibiotics     Stomach irritation  . Tetracycline     Severe stomach pain   History   Social History  . Marital Status:  Divorced    Spouse Name: N/A    Number of Children: N/A  . Years of Education: N/A   Occupational History  . Not on file.   Social History Main Topics  . Smoking status: Never Smoker   . Smokeless tobacco: Not on file  . Alcohol Use: No  . Drug Use: No  . Sexual Activity: Not on file   Other Topics Concern  . Not on file   Social History Narrative  . No narrative on file      Review of Systems  All other systems reviewed and are negative.       Objective:   Physical Exam  Vitals reviewed. Constitutional: She is oriented to person, place, and time. She appears well-developed and well-nourished. No distress.  HENT:  Right Ear: External ear normal.  Left Ear: External ear normal.  Nose: Nose normal.  Mouth/Throat: Oropharynx is clear and moist. No oropharyngeal exudate.  Eyes: Conjunctivae are normal. No scleral icterus.  Neck: Neck supple. No thyromegaly present.  Cardiovascular: Normal rate, regular rhythm and normal heart sounds.  Exam reveals no gallop and no friction rub.   No murmur heard. Pulmonary/Chest: Effort normal and breath sounds normal. No respiratory distress. She has no wheezes. She has no rales. She exhibits no tenderness.  Abdominal: Soft. Bowel sounds are normal. She exhibits no distension. There is no tenderness. There is no rebound and no guarding.  Musculoskeletal: She exhibits no edema.  Lymphadenopathy:    She has no cervical adenopathy.  Neurological: She is alert and oriented to person, place, and time. No cranial nerve deficit. She exhibits normal muscle tone. Coordination normal.  Skin: No rash noted. She is not diaphoretic. No erythema.          Assessment & Plan:  1. Vasculitis While the patient has a positive ANA and extremely elevated sedimentation rate, the confirmatory test for lupus returned normal. 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  Consult pulmonary for evaluation, possible bronchoscopy and  biopsy to evaluate for involvement of the pulmonary vasculature. I would defer to the expert evaluation. She also has a pending rheumatology referral.  I recommended the patient discontinue prednisone. I believe that if her cough returns again off prednisone, it would be confirmation of an autoimmune source and allow Dr. Melvyn Novas to evaluate the patient at her baseline status.

## 2013-05-24 ENCOUNTER — Ambulatory Visit (INDEPENDENT_AMBULATORY_CARE_PROVIDER_SITE_OTHER): Payer: BC Managed Care – PPO | Admitting: Internal Medicine

## 2013-05-24 ENCOUNTER — Encounter: Payer: Self-pay | Admitting: Internal Medicine

## 2013-05-24 VITALS — BP 130/86 | HR 75 | Temp 97.7°F | Ht 63.0 in | Wt 228.8 lb

## 2013-05-24 DIAGNOSIS — I776 Arteritis, unspecified: Secondary | ICD-10-CM

## 2013-05-24 DIAGNOSIS — R059 Cough, unspecified: Secondary | ICD-10-CM

## 2013-05-24 DIAGNOSIS — R05 Cough: Secondary | ICD-10-CM

## 2013-05-24 MED ORDER — PREDNISONE 10 MG PO TABS: ORAL_TABLET | ORAL | Status: DC

## 2013-05-24 MED ORDER — ESOMEPRAZOLE MAGNESIUM 40 MG PO CPDR: 40.0000 mg | DELAYED_RELEASE_CAPSULE | Freq: Every day | ORAL | Status: DC

## 2013-05-24 NOTE — Progress Notes (Signed)
   Subjective:    Patient ID: Cassandra [name], female    DOB: 1956/02/03  MRN: [id number]  HPI  73 yowf never smoker eval by [facility] for sob and found to have vasculitis / renal dz eventually referred to [facility] and rx with prednisone x months, does not remember dx.  05/24/2013 1st [facility] office visit/ Magaby Rumberger  Chief Complaint  Patient presents with  . Pulmonary Consult    Referred per Dr Jenna Luo. Pt c/o cough for the past month. Cough is occ prod minimal clear sputum.  She states that she took prednisone taper a couple wks ago and cough resolved, then returned after med was finished.   prednisone 100% corrects both cp and cough last took it 05/20/13  arthalgias date back x years, maybe better on prednisone  Cough tends to be worse day than night No rash this exac otherwise feels like it did when she had the vasculitis assoc with mild nasal congestion as well and ESR 121 with Pos P Anca but no hematuria (creat not checked)  No obvious other patterns in day to day or daytime variabilty or assoc chronic sob or cp or chest tightness, subjective wheeze overt sinus or hb symptoms. No unusual exp hx or h/o childhood pna/ asthma or knowledge of premature birth.  Sleeping ok without nocturnal  or early am exacerbation  of respiratory  c/o's or need for noct saba. Also denies any obvious fluctuation of symptoms with weather or environmental changes or other aggravating or alleviating factors except as outlined above   Current Medications, Allergies, Complete Past Medical History, Past Surgical History, Family History, and Social History were reviewed in [employer] record.             Review of Systems  Constitutional: Negative for fever, chills and unexpected weight change.  HENT: Positive for dental problem. Negative for congestion, ear pain, nosebleeds, postnasal drip, rhinorrhea, sinus pressure, sneezing, sore throat, trouble swallowing and voice change.    Eyes: Negative for visual disturbance.  Respiratory: Positive for cough. Negative for choking and shortness of breath.   Cardiovascular: Negative for chest pain and leg swelling.  Gastrointestinal: Negative for vomiting, abdominal pain and diarrhea.  Genitourinary: Negative for difficulty urinating.  Musculoskeletal: Positive for arthralgias.  Skin: Negative for rash.  Neurological: Negative for tremors, syncope and headaches.  Hematological: Does not bruise/bleed easily.       Objective:   Physical Exam  amb hoarse wf   Wt Readings from Last 3 Encounters:  05/24/13 228 lb 12.8 oz (103.783 kg)  05/20/13 224 lb (101.606 kg)  05/13/13 226 lb (102.513 kg)     HEENT: nl dentition, turbinates, and orophanx. Nl external ear canals without cough reflex   NECK :  without JVD/Nodes/TM/ nl carotid upstrokes bilaterally   LUNGS: no acc muscle use, clear to A and P bilaterally without cough on insp or exp maneuvers   CV:  RRR  no s3 or murmur or increase in P2, no edema   ABD:  soft and nontender with nl excursion in the supine position. No bruits or organomegaly, bowel sounds nl  MS:  warm without deformities, calf tenderness, cyanosis or clubbing  SKIN: warm and dry without lesions    NEURO:  alert, approp, no deficits     cxr 04/26/13  No active cardiopulmonary disease.       Assessment & Plan:

## 2013-05-24 NOTE — Patient Instructions (Addendum)
Nexium 40 mg Take 30- 60 min before your first and last meals of the day   GERD (REFLUX)  is an extremely common cause of respiratory symptoms, many times with no significant heartburn at all.    It can be treated with medication, but also with lifestyle changes including avoidance of late meals, excessive alcohol, smoking cessation, and avoid fatty foods, chocolate, peppermint, colas, red wine, and acidic juices such as orange juice.  NO MINT OR MENTHOL PRODUCTS SO NO COUGH DROPS  USE SUGARLESS CANDY INSTEAD (jolley ranchers or Stover's)  NO OIL BASED VITAMINS - use powdered substitutes.    I will ask Dr Tanya NonesPickard to check his records and send you back to the [id number] that saw you previously but in meantime if condition worsens: Prednisone Take 4 for three days 3 for three days 2 for three days 1 for three days and stop

## 2013-05-25 NOTE — Assessment & Plan Note (Signed)
Not clear this is related to vasculitis but does appear to be steroid responsive so certainly suggestive rec rx max gerd rx while sorting out cause for elevated esr/ C anca (see vasculitis)

## 2013-05-25 NOTE — Assessment & Plan Note (Signed)
-  ESR 121 04/26/13 with C-ANCA pos 4:66  This is certainly suggestive of wegener's though nothing besides the cough and arthralgias to suggest it.  She has been referred to Rheum but this won't happen until May per pt  Rec Move up rheum appt to w/in the next 2 weeks and certainly needs bmet/ua at a minimum then Hold off further steroids for now Consider refer back to [facility] if more expedient but at the very least rheum here will need records from whomever she saw there (she does not remember name or department and nothing in [place] on this)

## 2013-06-02 ENCOUNTER — Ambulatory Visit (INDEPENDENT_AMBULATORY_CARE_PROVIDER_SITE_OTHER): Payer: BC Managed Care – PPO | Admitting: Family Medicine

## 2013-06-02 ENCOUNTER — Encounter: Payer: Self-pay | Admitting: Family Medicine

## 2013-06-02 ENCOUNTER — Ambulatory Visit
Admission: RE | Admit: 2013-06-02 | Discharge: 2013-06-02 | Disposition: A | Discharge status: Home / Self Care | Payer: BC Managed Care – PPO | Source: Ambulatory Visit | Attending: Family Medicine | Admitting: Family Medicine

## 2013-06-02 ENCOUNTER — Other Ambulatory Visit: Payer: Self-pay | Admitting: Family Medicine

## 2013-06-02 VITALS — BP 110/68 | HR 80 | Temp 97.4°F | Resp 20 | Ht 63.0 in | Wt 226.0 lb

## 2013-06-02 DIAGNOSIS — I776 Arteritis, unspecified: Secondary | ICD-10-CM

## 2013-06-02 DIAGNOSIS — R0609 Other forms of dyspnea: Secondary | ICD-10-CM

## 2013-06-02 DIAGNOSIS — I7782 Antineutrophilic cytoplasmic antibody (ANCA) vasculitis: Secondary | ICD-10-CM

## 2013-06-02 DIAGNOSIS — R0989 Other specified symptoms and signs involving the circulatory and respiratory systems: Secondary | ICD-10-CM

## 2013-06-02 DIAGNOSIS — R0600 Dyspnea, unspecified: Secondary | ICD-10-CM

## 2013-06-02 DIAGNOSIS — J189 Pneumonia, unspecified organism: Secondary | ICD-10-CM

## 2013-06-02 LAB — URINALYSIS, ROUTINE W REFLEX MICROSCOPIC
Glucose, UA: NEGATIVE mg/dL
HGB URINE DIPSTICK: NEGATIVE
Ketones, ur: 15 mg/dL — AB
LEUKOCYTES UA: NEGATIVE
NITRITE: NEGATIVE
PROTEIN: 30 mg/dL — AB
Specific Gravity, Urine: 1.025 (ref 1.005–1.030)
UROBILINOGEN UA: 1 mg/dL (ref 0.0–1.0)
pH: 6 (ref 5.0–8.0)

## 2013-06-02 LAB — URINALYSIS, MICROSCOPIC ONLY: RBC / HPF: NONE SEEN RBC/hpf (ref <3)

## 2013-06-02 MED ORDER — LEVOFLOXACIN 500 MG PO TABS: 750.0000 mg | ORAL_TABLET | Freq: Every day | ORAL | Status: DC

## 2013-06-02 MED ORDER — IPRATROPIUM-ALBUTEROL 0.5-2.5 (3) MG/3ML IN SOLN
3.0000 mL | Freq: Once | RESPIRATORY_TRACT | Status: AC
Start: 2013-06-02 — End: 2013-06-02
  Administered 2013-06-02: 3 mL via RESPIRATORY_TRACT

## 2013-06-02 MED ORDER — LEVOFLOXACIN 750 MG PO TABS: 750.0000 mg | ORAL_TABLET | Freq: Every day | ORAL | Status: DC

## 2013-06-02 NOTE — Addendum Note (Signed)
Addended by: Lynnea FerrierPICKARD, [place] Streat on: 06/02/2013 03:27 PM   Modules accepted: Orders

## 2013-06-02 NOTE — Progress Notes (Signed)
 Subjective:    Patient ID: Cassandra [name], female    DOB: 08/21/1955, 57 y.o.   MRN: [id number]  HPI 04/26/13 I saw patient in 02/2009 and saw the patient with pleurisy and bronchitis, she was treated with antibiotics and developed a rash on her arms, legs and torso that was characterized by 5-6 mm blanchable macules.  Biopsy revealed leukocytoclastic vasculitis.  Labs at that time were ANA negative, ESR of 44, P-ANCA 1:160, MPO abs 27.5, PR-3 abs 4.9.  She was referred to pulmonary and renal who recommended regular follow up bu the patient was lost to follow up due to lack of insurance.  Since that time, she has had several episodes of cough and pleurisy.  The most recent began 3 weeks ago.  It is 70% better.  She denies any hemoptysis, fever, chills, or DOE.  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  She is here today for follow up.  She reports frequent arthritic pains in her PIP and DIP joints. She has bilateral knee pain. She denies any gastrointestinal symptoms. She has had no further rash since I last saw the patient in 2010. She does have frequent bouts of "bronchitis".  At that time, my plan was:  The differential diagnosis for the patient's pleurisy is a viral upper respiratory infection, pleurisy due to autoimmune disease, or pleurisy due to infection. I will check a sedimentation rate as well as a chest x-ray. If the chest x-ray shows evidence of infection, I will treat with antibiotics. However, if the chest x-ray is normal and the sedimentation rate is elevated, I believe the patient would benefit for treatment of her pleurisy with oral steroids.  If the chest x-ray and sedimentation rate are normal, I recommend tincture of time as this may be a viral upper respiratory infection. I do believe the patient has an underlying autoimmune problem whether the lupus or vasculitis. I recommended a rheumatology consultation to help  decipher the exact nature of her autoimmune problem so that we may better be able to treat her exacerbations in the future.   The patient's chest x-ray was negative for infection or effusion.  Her sedimentation rate was extremely elevated.  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  Patient's symptoms completely resolved in one week. Shortly after discontinuing the prednisone, her cough and pleurisy has resumed. She is now having mild shortness of breath. She is having pleurisy in the left chest and in the right chest. The cough is productive of clear sputum. She denies any hemoptysis. She does have a low-grade fever. She is also having diffuse myalgias and arthralgias in her shoulders hips knees and hands. Flu test today in the office is negative.  At that time, my plan was:   I believe the patient likely has lupus and worsening pulmonary manifestations of this condition along with myalgias and arthralgias.  I recommended starting the patient on prednisone 20 mg by mouth daily and I will see her back next week. If her symptoms are improving I will try to wean the patient down to the lowest effective dose of prednisone. I will also try to expedite her referral to a rheumatologist. I will check her complement levels and anti-double-stranded DNA test to confirm my suspicions.  I instructed the patient to seek medical attention immediately if her symptoms worsen.   Office Visit on 05/13/2013  Component Date   Value Ref Range Status  . Source-INFBD 05/13/2013 NASAL   Final  . Inflenza A Ag 05/13/2013 NEG  Negative Final  . Influenza B Ag 05/13/2013 NEG  Negative Final  . C3 Complement 05/13/2013 189* 90 - 180 mg/dL Final  . C4 Complement 05/13/2013 27  10 - 40 mg/dL Final  . ds DNA Ab 05/13/2013 1   Final   Comment:                                                          IU/mL       Interpretation                                                        < or = 4     Negative                                                        5-9         Indeterminate                                                        > or = 10   Positive                               05/20/13 The confirmatory tests for lupus returned normal. Fortunately her cough improved 90% on prednisone 20 mg by mouth daily. The patient feels well today on immunosuppressive therapy. Dr. Melvyn Novas with pulmonary is working the patient in next week to be evaluated for pulmonary vasculitis.  At that time, my plan was: 1. Vasculitis While the patient has a positive ANA and extremely elevated sedimentation rate, the confirmatory test for lupus returned normal. 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  Consult pulmonary for evaluation, possible bronchoscopy and biopsy to evaluate for involvement of the pulmonary vasculature. I would defer to the expert evaluation. She also has a pending rheumatology referral.  I recommended the patient discontinue prednisone. I believe that if her cough returns again off prednisone, it would be confirmation of an autoimmune source and allow Dr. Melvyn Novas to evaluate the patient at her baseline status.   06/02/13 The patient stopped her prednisone. One week ago her cough resumed. Now she has a significant amount of chest congestion and shortness of breath. She denies any fever. She denies any hemoptysis. She does report sinus irritation. She recently resumed 30 mg of prednisone on her own.  She does report right-sided pleurisy      Past Medical History  Diagnosis Date  . Allergy   . Anxiety   . Depression   . GERD (gastroesophageal reflux disease)    Past Surgical History  Procedure Laterality  Date  . Tonsillectomy  08/2002  . Carpal tunnel release Right 2002  . Tubal ligation  1980  . Total knee arthroplasty Right   . Bunionectomy Bilateral    Current Outpatient Prescriptions on File Prior to Visit  Medication Sig Dispense Refill  . cetirizine  (ZYRTEC) 10 MG tablet Take 10 mg by mouth daily.      . Cholecalciferol (VITAMIN D-3) 5000 UNITS TABS Take 1 tablet by mouth daily.       . esomeprazole (NEXIUM) 40 MG capsule Take 1 capsule (40 mg total) by mouth daily at 12 noon.  30 capsule  2  . sertraline (ZOLOFT) 100 MG tablet Take 1 tablet (100 mg total) by mouth at bedtime.  90 tablet  1   No current facility-administered medications on file prior to visit.   Allergies  Allergen Reactions  . Ampicillin   . Clarithromycin   . Codeine   . Effexor [Venlafaxine]   . Flagyl [Metronidazole]   . Fluoxetine Hcl Itching    Deep Itch   . Levofloxacin   . Myrbetriq [Mirabegron]     Caused back pain  . Nexium [Esomeprazole Magnesium] Swelling    Swelling of tongue and sores in mouth  . Nsaids Other (See Comments)    Hypersensitivity vasculitis  . Paxil [Paroxetine]   . Sulfa Antibiotics     Stomach irritation  . Tetracycline     Severe stomach pain   History   Social History  . Marital Status: Divorced    Spouse Name: N/A    Number of Children: N/A  . Years of Education: N/A   Occupational History  . Unemployed    Social History Main Topics  . Smoking status: Never Smoker   . Smokeless tobacco: Never Used  . Alcohol Use: No  . Drug Use: No  . Sexual Activity: Not on file   Other Topics Concern  . Not on file   Social History Narrative  . No narrative on file      Review of Systems  All other systems reviewed and are negative.       Objective:   Physical Exam  Vitals reviewed. Constitutional: She is oriented to person, place, and time. She appears well-developed and well-nourished. No distress.  HENT:  Right Ear: External ear normal.  Left Ear: External ear normal.  Nose: Nose normal.  Mouth/Throat: Oropharynx is clear and moist. No oropharyngeal exudate.  Eyes: Conjunctivae are normal. No scleral icterus.  Neck: Neck supple. No thyromegaly present.  Cardiovascular: Normal rate, regular rhythm and  normal heart sounds.  Exam reveals no gallop and no friction rub.   No murmur heard. Pulmonary/Chest: Effort normal. No respiratory distress. She has wheezes. She has rales. She exhibits no tenderness.  Abdominal: Soft. Bowel sounds are normal. She exhibits no distension. There is no tenderness. There is no rebound and no guarding.  Musculoskeletal: She exhibits no edema.  Lymphadenopathy:    She has no cervical adenopathy.  Neurological: She is alert and oriented to person, place, and time. No cranial nerve deficit. She exhibits normal muscle tone. Coordination normal.  Skin: No rash noted. She is not diaphoretic. No erythema.   patient has rhonchorous breath sounds bilaterally. She has right lower lobe Rales. She is also wheezing bilaterally. The patient was treated with a DuraNeb. Symptomatically she did not improve. She denies this helped the wheezing in all. She has no history of asthma or COPD or smoking          Assessment & Plan:   1. Dyspnea  - DG Chest 2 View; Future - levofloxacin (LEVAQUIN) 500 MG tablet; Take 1.5 tablets (750 mg total) by mouth daily.  Dispense: 10 tablet; Refill: 0  2. Vasculitis - BASIC METABOLIC PANEL WITH GFR - Urinalysis, Routine w reflex microscopic  3. Pneumonia   I am going to obtain a urinalysis as well as a BMP to evaluate her renal function given her history of possible vasculitis. I will also consult [facility] pulmonology for management. However the patient now has symptoms consistent with a community-acquired pneumonia. I will obtain a chest x-ray.  If the chest x-ray confirms pneumonia I will treat the patient with Levaquin 750 mg by mouth daily for 10 days. If the chest x-ray shows no evidence of pneumonia I will start the patient on high-dose steroids for possible vasculitis as an explanation of her abnormal breath sounds. I will also see if I can expedite her rheumatology consult.  

## 2013-06-02 NOTE — Addendum Note (Signed)
Addended by: Lynnea FerrierPICKARD, Payslee Bateson on: 06/02/2013 03:28 PM   Modules accepted: Orders

## 2013-06-03 LAB — BASIC METABOLIC PANEL WITH GFR
BUN: 18 mg/dL (ref 6–23)
CALCIUM: 9.1 mg/dL (ref 8.4–10.5)
CO2: 25 mEq/L (ref 19–32)
CREATININE: 0.76 mg/dL (ref 0.50–1.10)
Chloride: 100 mEq/L (ref 96–112)
GFR, EST NON AFRICAN AMERICAN: 87 mL/min
Glucose, Bld: 124 mg/dL — ABNORMAL HIGH (ref 70–99)
Potassium: 3.5 mEq/L (ref 3.5–5.3)
Sodium: 136 mEq/L (ref 135–145)

## 2013-06-09 ENCOUNTER — Ambulatory Visit (INDEPENDENT_AMBULATORY_CARE_PROVIDER_SITE_OTHER): Payer: BC Managed Care – PPO | Admitting: Family Medicine

## 2013-06-09 ENCOUNTER — Encounter: Payer: Self-pay | Admitting: Family Medicine

## 2013-06-09 VITALS — BP 128/76 | HR 80 | Temp 97.3°F | Resp 20 | Ht 63.0 in | Wt 227.0 lb

## 2013-06-09 DIAGNOSIS — I776 Arteritis, unspecified: Secondary | ICD-10-CM

## 2013-06-09 MED ORDER — PREDNISONE 10 MG PO TABS: 30.0000 mg | ORAL_TABLET | Freq: Every day | ORAL | Status: DC

## 2013-06-09 NOTE — Progress Notes (Signed)
Subjective:    Patient ID: Cassandra [name], female    DOB: 04/06/1955, 58 y.o.   MRN: [id number]  HPI 04/26/13 I saw patient in 02/2009 and saw the patient with pleurisy and bronchitis, she was treated with antibiotics and developed a rash on her arms, legs and torso that was characterized by 5-6 mm blanchable macules.  Biopsy revealed leukocytoclastic vasculitis.  Labs at that time were ANA negative, ESR of 44, P-ANCA 1:160, MPO abs 27.5, PR-3 abs 4.9.  She was referred to pulmonary and renal who recommended regular follow up bu the patient was lost to follow up due to lack of insurance.  Since that time, she has had several episodes of cough and pleurisy.  The most recent began 3 weeks ago.  It is 70% better.  She denies any hemoptysis, fever, chills, or DOE.  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  She is here today for follow up.  She reports frequent arthritic pains in her PIP and DIP joints. She has bilateral knee pain. She denies any gastrointestinal symptoms. She has had no further rash since I last saw the patient in 2010. She does have frequent bouts of "bronchitis".  At that time, my plan was:  The differential diagnosis for the patient's pleurisy is a viral upper respiratory infection, pleurisy due to autoimmune disease, or pleurisy due to infection. I will check a sedimentation rate as well as a chest x-ray. If the chest x-ray shows evidence of infection, I will treat with antibiotics. However, if the chest x-ray is normal and the sedimentation rate is elevated, I believe the patient would benefit for treatment of her pleurisy with oral steroids.  If the chest x-ray and sedimentation rate are normal, I recommend tincture of time as this may be a viral upper respiratory infection. I do believe the patient has an underlying autoimmune problem whether the lupus or vasculitis. I recommended a rheumatology consultation to help  decipher the exact nature of her autoimmune problem so that we may better be able to treat her exacerbations in the future.   The patient's chest x-ray was negative for infection or effusion.  Her sedimentation rate was extremely elevated.  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  Patient's symptoms completely resolved in one week. Shortly after discontinuing the prednisone, her cough and pleurisy has resumed. She is now having mild shortness of breath. She is having pleurisy in the left chest and in the right chest. The cough is productive of clear sputum. She denies any hemoptysis. She does have a low-grade fever. She is also having diffuse myalgias and arthralgias in her shoulders hips knees and hands. Flu test today in the office is negative.  At that time, my plan was:   I believe the patient likely has lupus and worsening pulmonary manifestations of this condition along with myalgias and arthralgias.  I recommended starting the patient on prednisone 20 mg by mouth daily and I will see her back next week. If her symptoms are improving I will try to wean the patient down to the lowest effective dose of prednisone. I will also try to expedite her referral to a rheumatologist. I will check her complement levels and anti-double-stranded DNA test to confirm my suspicions.  I instructed the patient to seek medical attention immediately if her symptoms worsen.   Office Visit on 06/02/2013  Component Date  Value Ref Range Status  . Sodium 06/02/2013 136  135 - 145 mEq/L Final  . Potassium 06/02/2013 3.5  3.5 - 5.3 mEq/L Final  . Chloride 06/02/2013 100  96 - 112 mEq/L Final  . CO2 06/02/2013 25  19 - 32 mEq/L Final  . Glucose, Bld 06/02/2013 124* 70 - 99 mg/dL Final  . BUN 06/02/2013 18  6 - 23 mg/dL Final  . Creat 06/02/2013 0.76  0.50 - 1.10 mg/dL Final  . Calcium 06/02/2013 9.1  8.4 - 10.5 mg/dL Final  . GFR, Est African American 06/02/2013 >89   Final  .  GFR, Est Non African American 06/02/2013 87   Final   Comment:                            The estimated GFR is a calculation valid for adults (>=59 years old)                          that uses the CKD-EPI algorithm to adjust for age and sex. It is                            not to be used for children, pregnant women, hospitalized patients,                             patients on dialysis, or with rapidly changing kidney function.                          According to the NKDEP, eGFR >89 is normal, 60-89 shows mild                          impairment, 30-59 shows moderate impairment, 15-29 shows severe                          impairment and <15 is ESRD.                             Marland Kitchen Color, Urine 06/02/2013 YELLOW  YELLOW Final  . APPearance 06/02/2013 CLEAR  CLEAR Final  . Specific Gravity, Urine 06/02/2013 1.025  1.005 - 1.030 Final  . pH 06/02/2013 6.0  5.0 - 8.0 Final  . Glucose, UA 06/02/2013 NEG  NEG mg/dL Final  . Bilirubin Urine 06/02/2013 SMALL* NEG Final  . Ketones, ur 06/02/2013 15* NEG mg/dL Final  . Hgb urine dipstick 06/02/2013 NEG  NEG Final  . Protein, ur 06/02/2013 30* NEG mg/dL Final  . Urobilinogen, UA 06/02/2013 1  0.0 - 1.0 mg/dL Final  . Nitrite 06/02/2013 NEG  NEG Final  . Leukocytes, UA 06/02/2013 NEG  NEG Final  . Squamous Epithelial / LPF 06/02/2013 RARE  RARE Final  . Crystals 06/02/2013 Calcium Oxalate crystals noted  NONE SEEN Final  . Casts 06/02/2013 Hyaline casts noted  NONE SEEN Final  . WBC, UA 06/02/2013 0-2  <3 WBC/hpf Final  . RBC / HPF 06/02/2013 NONE SEEN  <3 RBC/hpf Final  . Bacteria, UA 06/02/2013 RARE  RARE Final   UNSPUN MICRO, QNS TO SPIN    05/20/13 The confirmatory tests for lupus returned normal. Fortunately her cough improved 90% on prednisone  20 mg by mouth daily. The patient feels well today on immunosuppressive therapy. Dr. Melvyn Novas with pulmonary is working the patient in next week to be evaluated for pulmonary vasculitis.  At that time,  my plan was: 1. Vasculitis While the patient has a positive ANA and extremely elevated sedimentation rate, the confirmatory test for lupus returned normal. 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  Consult pulmonary for evaluation, possible bronchoscopy and biopsy to evaluate for involvement of the pulmonary vasculature. I would defer to the expert evaluation. She also has a pending rheumatology referral.  I recommended the patient discontinue prednisone. I believe that if her cough returns again off prednisone, it would be confirmation of an autoimmune source and allow Dr. Melvyn Novas to evaluate the patient at her baseline status.   06/02/13 The patient stopped her prednisone. One week ago her cough resumed. Now she has a significant amount of chest congestion and shortness of breath. She denies any fever. She denies any hemoptysis. She does report sinus irritation. She recently resumed 30 mg of prednisone on her own.  She does report right-sided pleurisy.  At that time, my plan was: 1. Dyspnea  - DG Chest 2 View; Future - levofloxacin (LEVAQUIN) 500 MG tablet; Take 1.5 tablets (750 mg total) by mouth daily.  Dispense: 10 tablet; Refill: 0  2. Vasculitis - BASIC METABOLIC PANEL WITH GFR - Urinalysis, Routine w reflex microscopic  3. Pneumonia   I am going to obtain a urinalysis as well as a BMP to evaluate her renal function given her history of possible vasculitis. I will also consult [place] [facility] pulmonology for management. However the patient now has symptoms consistent with a community-acquired pneumonia. I will obtain a chest x-ray.  If the chest x-ray confirms pneumonia I will treat the patient with Levaquin 750 mg by mouth daily for 10 days. If the chest x-ray shows no evidence of pneumonia I will start the patient on high-dose steroids for possible vasculitis as an explanation of her abnormal breath sounds. I will also see if I can expedite her rheumatology  consult.  06/09/13 Patient's chest x-ray returned completely normal. There is no evidence for  pneumonia. The patient did not improve on Levaquin. Therefore we started the patient on prednisone. She is currently on 40 mg by mouth daily. Her breathing has improved 80% since starting the prednisone again. She is no longer wheezing. Her lungs sound much much better. There are no rails rhonchorous breath sounds. She denies any fever or hemoptysis. The pleurisy has resolved.  She is scheduled to see [facility] April 19.      Past Medical History  Diagnosis Date  . Allergy   . Anxiety   . Depression   . GERD (gastroesophageal reflux disease)    Past Surgical History  Procedure Laterality Date  . Tonsillectomy  08/2002  . Carpal tunnel release Right 2002  . Tubal ligation  1980  . Total knee arthroplasty Right   . Bunionectomy Bilateral    Current Outpatient Prescriptions on File Prior to Visit  Medication Sig Dispense Refill  . cetirizine (ZYRTEC) 10 MG tablet Take 10 mg by mouth daily.      . Cholecalciferol (VITAMIN D-3) 5000 UNITS TABS Take 1 tablet by mouth daily.       Marland Kitchen esomeprazole (NEXIUM) 40 MG capsule Take 1 capsule (40 mg total) by mouth daily at 12 noon.  30 capsule  2  . levofloxacin (LEVAQUIN) 500 MG tablet Take 1.5  tablets (750 mg total) by mouth daily.  10 tablet  0  . sertraline (ZOLOFT) 100 MG tablet Take 1 tablet (100 mg total) by mouth at bedtime.  90 tablet  1   No current facility-administered medications on file prior to visit.   Allergies  Allergen Reactions  . Ampicillin   . Clarithromycin   . Codeine   . Effexor [Venlafaxine]   . Flagyl [Metronidazole]   . Fluoxetine Hcl Itching    Deep Itch   . Levofloxacin   . Myrbetriq [Mirabegron]     Caused back pain  . Nexium [Esomeprazole Magnesium] Swelling    Swelling of tongue and sores in mouth  . Nsaids Other (See Comments)    Hypersensitivity vasculitis  . Paxil [Paroxetine]   . Sulfa Antibiotics      Stomach irritation  . Tetracycline     Severe stomach pain   History   Social History  . Marital Status: Divorced    Spouse Name: N/A    Number of Children: N/A  . Years of Education: N/A   Occupational History  . Unemployed    Social History Main Topics  . Smoking status: Never Smoker   . Smokeless tobacco: Never Used  . Alcohol Use: No  . Drug Use: No  . Sexual Activity: Not on file   Other Topics Concern  . Not on file   Social History Narrative  . No narrative on file      Review of Systems  All other systems reviewed and are negative.       Objective:   Physical Exam  Vitals reviewed. Constitutional: She is oriented to person, place, and time. She appears well-developed and well-nourished. No distress.  HENT:  Right Ear: External ear normal.  Left Ear: External ear normal.  Nose: Nose normal.  Mouth/Throat: Oropharynx is clear and moist. No oropharyngeal exudate.  Eyes: Conjunctivae are normal. No scleral icterus.  Neck: Neck supple. No thyromegaly present.  Cardiovascular: Normal rate, regular rhythm and normal heart sounds.  Exam reveals no gallop and no friction rub.   No murmur heard. Pulmonary/Chest: Effort normal. No respiratory distress. She has no wheezes. She has no rales. She exhibits no tenderness.  Abdominal: Soft. Bowel sounds are normal. She exhibits no distension. There is no tenderness. There is no rebound and no guarding.  Musculoskeletal: She exhibits no edema.  Lymphadenopathy:    She has no cervical adenopathy.  Neurological: She is alert and oriented to person, place, and time. No cranial nerve deficit. She exhibits normal muscle tone. Coordination normal.  Skin: No rash noted. She is not diaphoretic. No erythema.        Assessment & Plan:   1. Vasculitis At this point I am convinced that patient has pulmonary vasculitis causing her coughing and shortness of breath and pleurisy. 3 different times I started patient on prednisone  and 3 different times her breathing dramatically worsened upon discontinuation. At this point I'm going to begin weaning the patient gradually off prednisone. She'll take 30 mg a day for one week and then 20 mg a day for one week and then 10 mg a day for one week. At that point in time, she would be seen by the specialist at [facility] and they can determine if they feel she needs long-term immunosuppressive therapy with medications such as cytoxan.  I will certainly defer to their expertise.  Patient is to return immediately if symptoms worsen. - predniSONE (DELTASONE) 10 MG tablet; Take 3  tablets (30 mg total) by mouth daily with breakfast.  Dispense: 90 tablet; Refill: 0

## 2013-06-10 ENCOUNTER — Other Ambulatory Visit: Payer: Self-pay | Admitting: Family Medicine

## 2013-06-10 DIAGNOSIS — Z1231 Encounter for screening mammogram for malignant neoplasm of breast: Secondary | ICD-10-CM

## 2013-06-13 ENCOUNTER — Encounter: Payer: Self-pay | Admitting: *Deleted

## 2013-06-17 ENCOUNTER — Emergency Department (HOSPITAL_COMMUNITY)
Admission: EM | Admit: 2013-06-17 | Discharge: 2013-06-17 | Disposition: A | Discharge status: Home / Self Care | Payer: BC Managed Care – PPO | Attending: Emergency Medicine | Admitting: Emergency Medicine

## 2013-06-17 ENCOUNTER — Encounter (HOSPITAL_COMMUNITY): Payer: Self-pay | Admitting: Emergency Medicine

## 2013-06-17 ENCOUNTER — Emergency Department (HOSPITAL_COMMUNITY): Payer: BC Managed Care – PPO

## 2013-06-17 DIAGNOSIS — IMO0002 Reserved for concepts with insufficient information to code with codable children: Secondary | ICD-10-CM | POA: Insufficient documentation

## 2013-06-17 DIAGNOSIS — K219 Gastro-esophageal reflux disease without esophagitis: Secondary | ICD-10-CM | POA: Insufficient documentation

## 2013-06-17 DIAGNOSIS — Z8619 Personal history of other infectious and parasitic diseases: Secondary | ICD-10-CM | POA: Insufficient documentation

## 2013-06-17 DIAGNOSIS — I498 Other specified cardiac arrhythmias: Secondary | ICD-10-CM | POA: Insufficient documentation

## 2013-06-17 DIAGNOSIS — I471 Supraventricular tachycardia: Secondary | ICD-10-CM

## 2013-06-17 DIAGNOSIS — Z79899 Other long term (current) drug therapy: Secondary | ICD-10-CM | POA: Insufficient documentation

## 2013-06-17 DIAGNOSIS — F329 Major depressive disorder, single episode, unspecified: Secondary | ICD-10-CM | POA: Insufficient documentation

## 2013-06-17 DIAGNOSIS — R231 Pallor: Secondary | ICD-10-CM | POA: Insufficient documentation

## 2013-06-17 DIAGNOSIS — F411 Generalized anxiety disorder: Secondary | ICD-10-CM | POA: Insufficient documentation

## 2013-06-17 DIAGNOSIS — Z880 Allergy status to penicillin: Secondary | ICD-10-CM | POA: Insufficient documentation

## 2013-06-17 DIAGNOSIS — F3289 Other specified depressive episodes: Secondary | ICD-10-CM | POA: Insufficient documentation

## 2013-06-17 HISTORY — DX: Supraventricular tachycardia, unspecified: I47.10

## 2013-06-17 HISTORY — DX: Early congenital syphilitic osteochondropathy: M90.80

## 2013-06-17 HISTORY — DX: Early congenital syphilitic osteochondropathy: A50.02

## 2013-06-17 HISTORY — DX: Osteopathy in diseases classified elsewhere, unspecified site: A50.02

## 2013-06-17 HISTORY — DX: Supraventricular tachycardia: I47.1

## 2013-06-17 LAB — BASIC METABOLIC PANEL
BUN: 17 mg/dL (ref 6–23)
CALCIUM: 8.9 mg/dL (ref 8.4–10.5)
CO2: 25 meq/L (ref 19–32)
Chloride: 100 mEq/L (ref 96–112)
Creatinine, Ser: 0.68 mg/dL (ref 0.50–1.10)
GFR calc Af Amer: >90 mL/min (ref >90)
GLUCOSE: 219 mg/dL — AB (ref 70–99)
POTASSIUM: 4.6 meq/L (ref 3.7–5.3)
SODIUM: 139 meq/L (ref 137–147)

## 2013-06-17 LAB — CBC WITH DIFFERENTIAL/PLATELET
BASOS ABS: 0 10*3/uL (ref 0.0–0.1)
BASOS PCT: 0 % (ref 0–1)
Eosinophils Absolute: 0 10*3/uL (ref 0.0–0.7)
Eosinophils Relative: 0 % (ref 0–5)
HEMATOCRIT: 38.6 % (ref 36.0–46.0)
Hemoglobin: 13.1 g/dL (ref 12.0–15.0)
LYMPHS PCT: 16 % (ref 12–46)
Lymphs Abs: 1.5 10*3/uL (ref 0.7–4.0)
MCH: 30.7 pg (ref 26.0–34.0)
MCHC: 33.9 g/dL (ref 30.0–36.0)
MCV: 90.4 fL (ref 78.0–100.0)
MONO ABS: 0.2 10*3/uL (ref 0.1–1.0)
Monocytes Relative: 2 % — ABNORMAL LOW (ref 3–12)
NEUTROS ABS: 7.7 10*3/uL (ref 1.7–7.7)
NEUTROS PCT: 82 % — AB (ref 43–77)
Platelets: 212 10*3/uL (ref 150–400)
RBC: 4.27 MIL/uL (ref 3.87–5.11)
RDW: 14.3 % (ref 11.5–15.5)
WBC: 9.4 10*3/uL (ref 4.0–10.5)

## 2013-06-17 LAB — TROPONIN I: Troponin I: <0.3 ng/mL (ref <0.30)

## 2013-06-17 MED ORDER — ADENOSINE 6 MG/2ML IV SOLN
12.0000 mg | Freq: Once | INTRAVENOUS | Status: AC
  Administered 2013-06-17: 12 mg via INTRAVENOUS

## 2013-06-17 MED ORDER — DILTIAZEM HCL 30 MG PO TABS: 30.0000 mg | ORAL_TABLET | ORAL | Status: DC | PRN

## 2013-06-17 NOTE — ED Notes (Signed)
Patient is walking around the room.  States she is ready to go home.

## 2013-06-17 NOTE — ED Notes (Signed)
Pt. reports palpitations with arms numbness onset this evening , denies chest pain / respirations unlabored . No nausea or diaphoresis .

## 2013-06-17 NOTE — ED Provider Notes (Signed)
CSN: [id number]     Arrival date & time 06/17/13  0030 History   First MD Initiated Contact with Patient 06/17/13 0102     Chief Complaint  Patient presents with  . 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 She has been on prednisone for one month as well as recently started to take over-the-counter medications for respiratory symptoms including Sudafed and others that she cannot remember at this time. She reports acute onset of palpitations and lightheadedness which started at 10:00 PM, is constant, nothing makes it better or worse. She states this is similar to her prior episodes of SVT which converted either by themselves or with medication even to her by her doctor. No medication given prior to arrival this evening. Denies fevers chills or chest pain though she does have some shortness of breath the symptoms are severe and persistent  Patient is a 58 y.o. female presenting with palpitations. The history is provided by the patient and a relative.  Palpitations   Past Medical History  Diagnosis Date  . Allergy   . Anxiety   . Depression   . GERD (gastroesophageal reflux disease)   . SVT (supraventricular tachycardia)   . Wegner's disease (congenital syphilitic osteochondritis)    Past Surgical History  Procedure Laterality Date  . Tonsillectomy  08/2002  . Carpal tunnel release Right 2002  . Tubal ligation  1980  . Total knee arthroplasty Right   . Bunionectomy Bilateral    Family History  Problem Relation Age of Onset  . Clotting disorder Mother 2    Blood clot, foot amputation  . Arthritis Sister   . Heart attack Maternal Grandmother 51  . Cervical cancer Mother   . Breast cancer Sister   . Asthma Cousin    History   Substance Use Topics  . Smoking status: Never Smoker   . Smokeless tobacco: Never Used  . Alcohol Use: No   OB History   Grav Para Term Preterm Abortions TAB SAB Ect Mult Living                 Review of Systems  Cardiovascular: Positive for palpitations.  All other systems reviewed and are negative.      Allergies  Ampicillin; Clarithromycin; Codeine; Effexor; Flagyl; Fluoxetine hcl; Myrbetriq; Nsaids; Paxil; Reglan; Sulfa antibiotics; and Tetracycline  Home Medications   Current Outpatient Rx  Name  Route  Sig  Dispense  Refill  . Cholecalciferol (VITAMIN D-3) 5000 UNITS TABS   Oral   Take 1 tablet by mouth daily.          Marland Kitchen diltiazem (CARDIZEM) 30 MG tablet   Oral   Take 30 mg by mouth 4 (four) times daily as needed. For irrgular  heart beat         . esomeprazole (NEXIUM) 40 MG capsule   Oral   Take 1 capsule (40 mg total) by mouth daily at 12 noon.   30 capsule   2   . predniSONE (DELTASONE) 10 MG tablet   Oral   Take 3 tablets (30 mg total) by mouth daily with breakfast.   90 tablet   0   . sertraline (ZOLOFT) 100 MG tablet   Oral   Take 1 tablet (100 mg total) by mouth at bedtime.   90 tablet   1   .  diltiazem (CARDIZEM) 30 MG tablet   Oral   Take 1 tablet (30 mg total) by mouth as needed (for palpitations).   30 tablet   1   . levofloxacin (LEVAQUIN) 500 MG tablet   Oral   Take 750 mg by mouth daily. Finished course of medication two weeks ago from today (06-17-13)          BP 153/93  Pulse 88  Temp(Src) 98.1 F (36.7 C) (Oral)  Resp 20  Ht 5\' 3"  (1.6 m)  Wt 228 lb (103.42 kg)  BMI 40.40 kg/m2  SpO2 93% Physical Exam  Nursing note and vitals reviewed. Constitutional: She appears well-developed and well-nourished. She appears distressed.  HENT:  Head: Normocephalic and atraumatic.  Mouth/Throat: Oropharynx is clear and moist. No oropharyngeal exudate.  Eyes: EOM are normal. Pupils are equal, round, and reactive to light. Right  eye exhibits no discharge. Left eye exhibits no discharge. No scleral icterus.  Pale conjunctiva  Neck: Normal range of motion. Neck supple. No JVD present. No thyromegaly present.  Cardiovascular: Normal heart sounds and intact distal pulses.  Exam reveals no gallop and no friction rub.   No murmur heard. Severe regular narrow complex tachycardia, thready pulses  Pulmonary/Chest: Effort normal and breath sounds normal. No respiratory distress. She has no wheezes. She has no rales.  Abdominal: Soft. Bowel sounds are normal. She exhibits no distension and no mass. There is no tenderness.  Musculoskeletal: Normal range of motion. She exhibits no edema and no tenderness.  Lymphadenopathy:    She has no cervical adenopathy.  Neurological: She is alert. Coordination normal.  Skin: Skin is warm and dry. No rash noted. No erythema. There is pallor.  Psychiatric: She has a normal mood and affect. Her behavior is normal.    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 Tachycardia.  EXAM: CHEST  2 VIEW  COMPARISON:  Prior chest x-ray 06/02/2013  FINDINGS: External defibrillator pads project over the left chest. Stable cardiac and mediastinal contours. Increased pulmonary vascular congestion bordering on mild edema. Low inspiratory volumes. No focal airspace consolidation, pleural effusion or pneumothorax. No acute osseous abnormality.  IMPRESSION: Slightly increased pulmonary vascular congestion now bordering on mild edema.   Electronically  Signed   By: Malachy MoanHeath  McCullough M.D.   On: 06/17/2013 01:55     EKG Interpretation   Date/Time:  Friday June 17 2013 00:58:32 EDT Ventricular Rate:  113 PR Interval:  134 QRS Duration: 72 QT Interval:  306 QTC Calculation: 419 R Axis:   36 Text Interpretation:  Sinus tachycardia Probable left atrial enlargement  Baseline wander in lead(s) V3 Since last tracing Sinus tachycardia has  replaced Supraventricular tachycardia Abnormal ekg Confirmed by Agustine Rossitto   MD, Yaris Ferrell ([id number]) on 06/17/2013 1:08:19 AM      ED ECG REPORT  I personally interpreted this EKG   Date: 06/17/2013   Rate: 113  Rhythm: sinus tachycardia  QRS Axis: normal  Intervals: normal  ST/T Wave abnormalities: normal  Conduction Disutrbances:none  Narrative Interpretation:   Old EKG Reviewed: changes noted - SVT has resolved   MDM   Final diagnoses:  SVT (supraventricular tachycardia)    The patient is ill-appearing, pale, near syncopal and severely tachycardic with a borderline blood pressure. Thankfully she responded to a first dose of adenosine 12 mg after vagal maneuvers failed. At this time the patient is back in a normal sinus rhythm, blood pressure improved, pale skin has improved and she now has color in her cheeks. She will need further evaluation with chest x-ray secondary to her recent diagnosis of lung disease, likely a combination of medications including prednisone and Sudafed.  Workup is negative, patient encouraged not to use the medications other than prednisone which he needs for her underlying pulmonary disorder. No signs of pneumonia, no lab abnormalities, patient has not used the over-the-counter medications in over one week. She has converted and has not had recurrent SVT since adenosine was administered and appear stable for discharge.  Vida Roller, MD 06/17/13 [phone]

## 2013-06-17 NOTE — ED Notes (Signed)
Placed on the [facility], monitor and portable EKG machine.  Patient stated she has been feeling bad for about 3 hours.

## 2013-06-17 NOTE — Discharge Instructions (Signed)
Please take the medications prescribed - CARDIZEM - one tablet if you develop a racing heart rate - you MUST follow up with your doctor in next 2 days.  REturn to ER for recurrent symtpoms.  Please call your doctor for a followup appointment within 24-48 hours. When you talk to your doctor please let them know that you were seen in the emergency department and have them acquire all of your records so that they can discuss the findings with you and formulate a treatment plan to fully care for your new and ongoing problems.

## 2013-06-28 LAB — PULMONARY FUNCTION TEST

## 2013-07-08 ENCOUNTER — Ambulatory Visit
Admission: RE | Admit: 2013-07-08 | Discharge: 2013-07-08 | Disposition: A | Discharge status: Home / Self Care | Payer: Self-pay | Source: Ambulatory Visit | Attending: Family Medicine | Admitting: Family Medicine

## 2013-07-08 DIAGNOSIS — Z1231 Encounter for screening mammogram for malignant neoplasm of breast: Secondary | ICD-10-CM

## 2013-07-13 ENCOUNTER — Encounter: Payer: Self-pay | Admitting: *Deleted

## 2013-07-27 ENCOUNTER — Telehealth: Payer: Self-pay | Admitting: Internal Medicine

## 2013-07-27 DIAGNOSIS — R059 Cough, unspecified: Secondary | ICD-10-CM

## 2013-07-27 DIAGNOSIS — R0602 Shortness of breath: Secondary | ICD-10-CM

## 2013-07-27 DIAGNOSIS — R05 Cough: Secondary | ICD-10-CM

## 2013-07-27 NOTE — Telephone Encounter (Signed)
Pt advised, appt set and CT ordered. Carron CurieJennifer Castillo, CMA

## 2013-07-27 NOTE — Telephone Encounter (Signed)
Yes Dr Estanislado Pandy called me. I was waiting to hear back from Dr Melvyn Novas; I had left him a message. Ok, glad he is ok with me seeing her  Please set up HRCT : indication rule out ILD, chronic cough,, abnormal cxr  -  High Resolution CT chest without contrast on ILD protocol. Only  Dr Lorin Picket or Dr. Vinnie Langton to read  I can see her first available but if is greater than few weeks from now; please add her on in a 15 minute slot double book   Thanks  Dr. Brand Males, M.D., [facility].C.P Pulmonary and Critical Care Medicine Staff Physician [facility] Pager: [phone], If no answer or between  15:00h - 7:00h: call [phone]  07/27/2013 4:55 PM   PS" for my use P ANCA trace positive, ESR some high mabye, ANA postive, Lupus anticoag ? Positive

## 2013-07-27 NOTE — Telephone Encounter (Signed)
Fine with me - she was actually Dr Evlyn CourierSood's pt originally and [facility]

## 2013-07-27 NOTE — Telephone Encounter (Signed)
Dr Sherene SiresWert, please advise if okay to switch to MR thanks

## 2013-07-27 NOTE — Telephone Encounter (Signed)
MR, is this okay with you? Please advise thanks! 

## 2013-07-28 NOTE — Telephone Encounter (Signed)
ATC pt. Unable to leave voicemail. Will call back.   See phone message from 07/27/13- pt was needing apt scheduled with MR. Pt has already been scheduled apt. Calling pt to assure awareness of upcoming apt for 08/09/13.

## 2013-07-29 NOTE — Telephone Encounter (Signed)
ATC on mobile phone and unable to leave message. Lmtcb on home number. Will call back.

## 2013-08-01 NOTE — Telephone Encounter (Signed)
ATC cell #--no vmail/unable to leave message LMTCB home #

## 2013-08-01 NOTE — Telephone Encounter (Signed)
Spoke with pt and confirmed her appointment with MR for 08/09/13 @ 10:45 and informed her to be here 15 mins early to fill out paperwork. Pt verbalized understanding and is aware of time and location of appointment. Also, pt confirmed that she has a CT for 08/03/13 @ 11:00 a.m and will discuss these results at this time. Nothing further is needed.

## 2013-08-01 NOTE — Telephone Encounter (Signed)
Patient returning call.

## 2013-08-03 ENCOUNTER — Ambulatory Visit (INDEPENDENT_AMBULATORY_CARE_PROVIDER_SITE_OTHER)
Admission: RE | Admit: 2013-08-03 | Discharge: 2013-08-03 | Disposition: A | Discharge status: Home / Self Care | Payer: BC Managed Care – PPO | Source: Ambulatory Visit | Attending: Internal Medicine | Admitting: Internal Medicine

## 2013-08-03 DIAGNOSIS — R059 Cough, unspecified: Secondary | ICD-10-CM

## 2013-08-03 DIAGNOSIS — R05 Cough: Secondary | ICD-10-CM

## 2013-08-03 DIAGNOSIS — R0602 Shortness of breath: Secondary | ICD-10-CM

## 2013-08-09 ENCOUNTER — Encounter: Payer: Self-pay | Admitting: Internal Medicine

## 2013-08-09 ENCOUNTER — Ambulatory Visit (INDEPENDENT_AMBULATORY_CARE_PROVIDER_SITE_OTHER): Payer: BC Managed Care – PPO | Admitting: Internal Medicine

## 2013-08-09 ENCOUNTER — Encounter (INDEPENDENT_AMBULATORY_CARE_PROVIDER_SITE_OTHER): Payer: Self-pay

## 2013-08-09 VITALS — BP 132/82 | HR 74 | Ht 63.0 in | Wt 238.0 lb

## 2013-08-09 DIAGNOSIS — R0602 Shortness of breath: Secondary | ICD-10-CM

## 2013-08-09 DIAGNOSIS — I776 Arteritis, unspecified: Secondary | ICD-10-CM

## 2013-08-09 DIAGNOSIS — I7789 Other specified disorders of arteries and arterioles: Secondary | ICD-10-CM

## 2013-08-09 DIAGNOSIS — I7782 Antineutrophilic cytoplasmic antibody (ANCA) vasculitis: Secondary | ICD-10-CM

## 2013-08-09 NOTE — Patient Instructions (Addendum)
You do seem to have some inflammation in your lungs likely due to autoimmune disease but this is mild but can explain yoru symptoms possibly  I will touch base with Dr Prescott Parma records and Dr Corliss Skains and get back to you next several days

## 2013-08-09 NOTE — Progress Notes (Signed)
Subjective:    Patient ID: Cassandra [name], female    DOB: 01/05/1956, 58 y.o.   MRN: [id number]  HPI   OV 08/09/2013  Chief Complaint  Patient presents with  . Pulomary Consult    Transition of care from MW to MR. C/o SOB with activity but since on pred pt is able to do more activity wihtout becoming dyspneic. Denies CP and cough.     Very poor historian. Significant cotton dust exposuire +  Says in 2010 had episodic dyspnea, cough, chest soreness that Rx with intermittent prednisone. REcollects pulmonary eval at that time. Details not clear. Denies having had bronch. Unclear how long she took prednisone.  However, in review of Dr Estanislado Pandy notes patient had bronchitis in dec 2010 and then developed rash (LE, UE) after levaquin. 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 Apparently at that time per PCP notes Labs at that time were ANA negative, ESR of 44, P-ANCA 1:160, MPO abs 27.5, PR-3 abs 4.9  Were all the positive ones. She had microscopic hematuria but normal creat 11/30/2008 (result in epic) SHe followed with DR Halford Chessman, renal and even pulmnary [facility] and derm at [facility] (Derm notes 2011 at [facility] reviewed). It appears she took prednisone for several months and then stopped. She then felt well and Symptoms resolved by 2012 and was doing well. Also lost to followup due to loss of insurance. [facility] derm noets 08/23/2009 indicated that in Sept 2010: "HEpc serologies positive but Hep C Quantitative negative" and esr 76, crp 28 p-anca 1:160, mpo, pr3 abs + c-anca neg . However, she had neck rash at that time and they thought twas "urticaria Multiforme""    Then in 03/16/13 developed acute left red eyde with swelling and then since then intermittent recurrence of dyspnea, dry cough, chest soreness with arthralgia.  She was initally seen in urgent care who had her follow with DR Dennard Schaumann which she did on 05/13/13. He ordered autoimmune profile and noted ESR 121, ANA at 1:640 and P-ANCA at 1:80 but normal  complements and DS-DNA. He was concerned about "SLE "flare up /r recurrence and placed her on prednisone.    He followed her again 05/20/13 and noted cough resolved.  She saw Der Melvyn Novas 05/24/13 who placed her on PPI with pred taper to off.  HE shared concern for vasculitis given lab results but deferred to [facility] because patient was set up for [facility]. She saw PCP [facility], MD on 06/02/13 and was off prednisone at this time and noted to have cough, congestion, dyspnea, sinus issues and right pleuristy.  CXR was clear (personally reviewed)  On Exam she was Wheezing and had rales.  HE treated with levaquin but she did not improve. HE gave her prednisone and wheezing resolved and she felt well without wheeze when he saw her again 06/09/13   She then followed at [facility] Dr Idamae Schuller: a tthis time off prednisone for 10 days. She was very poor historian at this visit but her cough had recurred with clear sputum with fatigue, itchy skin, mouth sores, conjunctivies and along with symptoms of slep anpnea. Also, admitted to severe GERD and sinus congestion. PFT at this visit was essentialy normal.    She has needed repeated and intermittent prednisone courses. REports this is only thing that helps. Has seen Dr Idamae Schuller at [facility] when off prednisone but PFT near normal and apparently she has sleep study pending there. SHe has been told off different autoimmune disease at urgent care and other places  that she is confused. Currently back on prednisone. Dr Estanislado Pandy has referred her to me so she can have a local pulmonologist but she is ok with just following at one place ([facility] [place] [facility] both). She is frustrated she is seeing many docs but at same time has very poor insight into her problems    Autoimmune May 14, 2013 - no on seroids  P  ANCA 1:80 and positive, ANA 1:640, ESR 121 (was 19 in Jan 2015)  DS-DNA, C Anca and complement - normal - feb 2015   UA 06/02/13 - just came off steroids   - no hematuria  PFT June 28, 2013 at [facility] - done not on prednisone  - FVC 2.8L/94%, TLC 3.4/70%, DLCO 15.4/82%. Spirometry with flow volume loops demonstrates no airway obstruction. Lung  volumes are consistent with mild restrictive lung disease. The diffusing  capacity for carbon monoxide, a reflection of alveolar-capillary gas  transport, is normal.   CBC 2010 t0 April 2015: eos 2,6%, 3% and 0%  Reviewed Dr Ralene Cork notes from 07/22/13  Autoimmune labs 07/22/13  -P-ANCA, +ve @ 1:640, MPO > 8, ESR 49, ANA positive  @ 1:640, Cardiolipin IgM - 17 and high - C-ANCA, PR-3, CK 31, HIV , RF ,DSDNA, CCp, ssA, ssB, ENA, RNP, scl-70  - ALL negative - Hep C anitbody - REACTIVE.   - HepBSag - negative, Quantiferon Gold - Negative  CT chest 08/03/13 - done on  2 weeks pf prednisone EXAM:  CT CHEST WITHOUT CONTRAST  TECHNIQUE: CT chest 08/03/13 Multidetector CT imaging of the chest was performed following the  standard protocol without intravenous contrast. High resolution  imaging of the lungs, as well as inspiratory and expiratory imaging,  was performed.  COMPARISON: 12/04/2008.  FINDINGS:  No pathologically enlarged mediastinal lymph nodes. Hilar regions  are difficult to definitively evaluate but appear grossly  unremarkable. No axillary adenopathy. Heart size within normal  limits. There may be a small amount of pericardial fluid or  thickening, new from the prior exam. Small hiatal hernia.  Image quality is somewhat degraded by respiratory motion. Very mild  subpleural reticulation is seen in the right lung, without definite  zonal predominance. Very minimal involvement of the lingula and left  lower lobe (example series 8 and image 21). Findings are unchanged  from 12/04/2008. No traction bronchiectasis/ bronchiolectasis,  ground-glass, architectural distortion or honeycombing. No air  trapping on inspiratory and expiratory imaging. No pleural fluid.  Airway is unremarkable.  Incidental imaging of the  upper abdomen shows the visualized  portions of the liver, gallbladder, adrenal glands, kidneys, spleen,  pancreas, stomach and bowel to be grossly unremarkable. No upper  abdominal adenopathy. No worrisome lytic or sclerotic lesions.  Degenerative changes are seen in the spine.  IMPRESSION:  1. Minimal subpleural reticulation, right greater than left,  unchanged from 12/04/2008. 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  2. Question small amount of pericardial fluid or thickening, new.  Electronically Signed  By: Lorin Picket M.D.  On: 08/03/2013 12:07        Walking desaturation test 08/09/13 while on prednisone: 185 feet x 3 laps x RA: peak HR 111/min., Lowest pulse ox 94%  Review of Systems  Constitutional: Negative for fever and unexpected weight change.  HENT: Negative for congestion, dental problem, ear pain, nosebleeds, postnasal drip, rhinorrhea, sinus pressure, sneezing, sore throat and trouble swallowing.   Eyes: Negative for redness and itching.  Respiratory: Positive for  shortness of breath. Negative for cough, chest tightness and wheezing.   Cardiovascular: Negative for palpitations and leg swelling.  Gastrointestinal: Negative for nausea and vomiting.  Genitourinary: Negative for dysuria.  Musculoskeletal: Positive for joint swelling.  Skin: Negative for rash.  Neurological: Negative for headaches.  Hematological: Does not bruise/bleed easily.  Psychiatric/Behavioral: Negative for dysphoric mood. The patient is not nervous/anxious.    Current outpatient prescriptions:calcium citrate (CALCITRATE - DOSED IN MG ELEMENTAL CALCIUM) 950 MG tablet, Take 200 mg of elemental calcium by mouth daily., Disp: , Rfl: ;  Cholecalciferol (VITAMIN D-3) 5000 UNITS TABS, Take 1 tablet by mouth daily. , Disp: , Rfl: ;  diltiazem (CARDIZEM) 30 MG tablet, Take 1 tablet (30 mg total) by mouth as needed (for palpitations)., Disp: 30 tablet, Rfl: 1 esomeprazole  (NEXIUM) 40 MG capsule, Take 1 capsule (40 mg total) by mouth daily at 12 noon., Disp: 30 capsule, Rfl: 2;  potassium chloride (K-DUR,KLOR-CON) 10 MEQ tablet, Take 10 mEq by mouth daily., Disp: , Rfl: ;  predniSONE (DELTASONE) 10 MG tablet, Take 3 tablets (30 mg total) by mouth daily with breakfast., Disp: 90 tablet, Rfl: 0;  sertraline (ZOLOFT) 100 MG tablet, Take 1 tablet (100 mg total) by mouth at bedtime., Disp: 90 tablet, Rfl: 1   has a past medical history of Allergy; Anxiety; Depression; GERD (gastroesophageal reflux disease); SVT (supraventricular tachycardia); and Wegner's disease (congenital syphilitic osteochondritis).  has past surgical history that includes Tonsillectomy (08/2002); Carpal tunnel release (Right, 2002); Tubal ligation (1980); Total knee arthroplasty (Right); and Bunionectomy (Bilateral).     Objective:   Physical Exam  Vitals reviewed. Constitutional: She is oriented to person, place, and time. She appears well-developed and well-nourished. No distress.  Body mass index is 42.17 kg/(m^2).   HENT:  Head: Normocephalic and atraumatic.  Right Ear: External ear normal.  Left Ear: External ear normal.  Mouth/Throat: Oropharynx is clear and moist. No oropharyngeal exudate.  Eyes: Conjunctivae and EOM are normal. Pupils are equal, round, and reactive to light. Right eye exhibits no discharge. Left eye exhibits no discharge. No scleral icterus.  Neck: Normal range of motion. Neck supple. No JVD present. No tracheal deviation present. No thyromegaly present.  Cardiovascular: Normal rate, regular rhythm, normal heart sounds and intact distal pulses.  Exam reveals no gallop and no friction rub.   No murmur heard. Pulmonary/Chest: Effort normal and breath sounds normal. No respiratory distress. She has no wheezes. She has no rales. She exhibits no tenderness.  Abdominal: Soft. Bowel sounds are normal. She exhibits no distension and no mass. There is no tenderness. There is no  rebound and no guarding.  Musculoskeletal: Normal range of motion. She exhibits no edema and no tenderness.  Lymphadenopathy:    She has no cervical adenopathy.  Neurological: She is alert and oriented to person, place, and time. She has normal reflexes. No cranial nerve deficit. She exhibits normal muscle tone. Coordination normal.  Skin: Skin is warm and dry. No rash noted. She is not diaphoretic. No erythema. No pallor.  Psychiatric:  Flat affect Poor historian     Filed Vitals:   08/09/13 1101  Height: 5' 3"  (1.6 m)  Weight: 238 lb (107.956 kg)   Filed Vitals:   08/09/13 1101  BP: 132/82  Pulse: 74  Height: 5' 3"  (1.6 m)  Weight: 238 lb (107.956 kg)  SpO2: 96%        Assessment & Plan:  Tough case. Poor history and intermittent steroids during data point eval  for pulmonary making it hard to arrive at specific pulmonary diagnosis. However, serial data at 3 points (2010, feb 2015, may 2015) clearly demonstrate when symptomatic with respiratory issues her ESR is high, she is P-ANCA screen positive and c/w wit this MPO antibody positive. Her symptoms respond to steroids. Taken together, strongly c/w MPA disease - Vasculitis.   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 This suggests pulmonary symptoms are airway related and c/w this she ws noticed to have wheezing in march 2015 PCP visit.  Unfortunately we do not have PFT when symptoms were active ([facility] PFt April 2015, done when 10 days off prednisone so not fully reflective of being off prednisone)  DDx is cryoglobulinemia (prior leukocytoclastic vasculitis and Hep C positive), Erick Alley - I think these are unlikely but will leave it to Dr Bronson Curb to make call on this   Rx PLAN  - It would be nice to have tissue diagnosis of current likely vasculitis but will need it  from nose or airway or skin lesions when symptomatic (these sites are @ risk for false negative). Or, will have to biopsy from renal or lungs if she were to worsen  -  Would continue prednisone -given obesity will be nice to find the lowest floor dose; will let Dr Estanislado Pandy know this   - ? Does she need 2nd immunomodulator. In 2015: Given no frank alveolar hemorrhage, stable creatinine, no hematuria, normal PFT, CT showing stable ILD since 2010 (though lot of these data points have been elicited with her on prednisone or off prednisone for several days only) - perhaps no role for rituxan/cytoxan but for methotrexate. Will let Dr Estanislado Pandy decide this  - If worsens, rituxan  - we can consolidate followup here at [place]  - will see her  Back in 43 month

## 2013-08-12 ENCOUNTER — Telehealth: Payer: Self-pay | Admitting: Internal Medicine

## 2013-08-12 DIAGNOSIS — I7782 Antineutrophilic cytoplasmic antibody (ANCA) vasculitis: Secondary | ICD-10-CM | POA: Insufficient documentation

## 2013-08-12 DIAGNOSIS — I776 Arteritis, unspecified: Secondary | ICD-10-CM | POA: Insufficient documentation

## 2013-08-12 NOTE — Telephone Encounter (Signed)
lmtcb

## 2013-08-12 NOTE — Telephone Encounter (Signed)
Pt returning call.Stanley A Dalton ° °

## 2013-08-12 NOTE — Telephone Encounter (Signed)
Called and spoke to pt regarding recs per MR. Appt was made for 6/29 at 3:30pm for f/u after CT. Pt confirmed appt time and date and denied any further questions or concerns at this time.

## 2013-08-12 NOTE — Assessment & Plan Note (Signed)
Tough case. Poor history and intermittent steroids during data point eval for pulmonary making it hard to arrive at specific pulmonary diagnosis. However, serial data at 3 points (2010, feb 2015, may 2015) clearly demonstrate when symptomatic with respiratory issues her ESR is high, she is P-ANCA screen positive and c/w wit this MPO antibody positive. Her symptoms respond to steroids. Taken together, strongly c/w MPA disease - Vasculitis.   However, fact April 2015 PFT at [facility] was normal (done between steroid courses), and CT Chest 08/03/13 showed very mild early and subtle ILD (so subtle not reflected in PFT) that is unchanged since 2010. This suggests pulmonary symptoms are airway related and c/w this she ws noticed to have wheezing in march 2015 PCP visit.  Unfortunately we do not have PFT when symptoms were active ([facility] PF[facility] April 2015, done when 10 days off prednisone so not fully reflective of being off prednisone)  DDx is cryoglobulinemia (prior leukocytoclastic vasculitis and Hep C positive), Erick Alley - I think these are unlikely but will leave it to Dr Bronson Curb to make call on this   Rx PLAN  - It would be nice to have tissue diagnosis of vasculitis but will need it from nose or airway when symptomatic (these sites are @ risk for false negative). Or, will have to biopsy from renal or lungs if she were to worsen  -  Would continue prednisone -given obesity will be nice to find the lowest floor dose; will let Dr Estanislado Pandy know this   - ? Does she need 2nd immunomodulator. In 2015: Given no frank alveolar hemorrhage, stable creatinine, no hematuria, normal PFT, CT showing stable ILD since 2010 (though lot of these data points have been elicited with her on prednisone or off prednisone for several days only) - perhaps no role for rituxan/cytoxan but for methotrexate  - we can consolidate followup here at [place]/

## 2013-08-12 NOTE — Telephone Encounter (Signed)
Please let her know that I have sent my detailed note to Dr Corliss Skains. For now I recommend her continuing prednisone under her care wihtout stopping. She should return to see me in 1 month for review. Do spirometry at time of followup; done by CMA (not Jerolyn Shin).    Dr. Kalman Shan, M.D., [facility].C.P Pulmonary and Critical Care Medicine Staff Physician [facility] Pager: [phone], If no answer or between  15:00h - 7:00h: call [phone]  08/12/2013 6:11 AM

## 2013-09-12 ENCOUNTER — Ambulatory Visit (INDEPENDENT_AMBULATORY_CARE_PROVIDER_SITE_OTHER): Payer: BC Managed Care – PPO | Admitting: Internal Medicine

## 2013-09-12 ENCOUNTER — Encounter: Payer: Self-pay | Admitting: Internal Medicine

## 2013-09-12 VITALS — BP 142/84 | HR 100 | Ht 63.0 in | Wt 246.0 lb

## 2013-09-12 DIAGNOSIS — R0602 Shortness of breath: Secondary | ICD-10-CM

## 2013-09-12 DIAGNOSIS — I776 Arteritis, unspecified: Secondary | ICD-10-CM

## 2013-09-12 DIAGNOSIS — I7782 Antineutrophilic cytoplasmic antibody (ANCA) vasculitis: Secondary | ICD-10-CM

## 2013-09-12 NOTE — Patient Instructions (Addendum)
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

## 2013-09-12 NOTE — Progress Notes (Signed)
Subjective:    Patient ID: Cassandra [name], female    DOB: 01/05/1956, 58 y.o.   MRN: [id number]  HPI   OV 08/09/2013  Chief Complaint  Patient presents with  . Pulomary Consult    Transition of care from MW to MR. C/o SOB with activity but since on pred pt is able to do more activity wihtout becoming dyspneic. Denies CP and cough.     Very poor historian. Significant cotton dust exposuire +  Says in 2010 had episodic dyspnea, cough, chest soreness that Rx with intermittent prednisone. REcollects pulmonary eval at that time. Details not clear. Denies having had bronch. Unclear how long she took prednisone.  However, in review of Dr Estanislado Pandy notes patient had bronchitis in dec 2010 and then developed rash (LE, UE) after levaquin. 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 Apparently at that time per PCP notes Labs at that time were ANA negative, ESR of 44, P-ANCA 1:160, MPO abs 27.5, PR-3 abs 4.9  Were all the positive ones. She had microscopic hematuria but normal creat 11/30/2008 (result in epic) SHe followed with DR Halford Chessman, renal and even pulmnary [facility] and derm at [facility] (Derm notes 2011 at [facility] reviewed). It appears she took prednisone for several months and then stopped. She then felt well and Symptoms resolved by 2012 and was doing well. Also lost to followup due to loss of insurance. [facility] derm noets 08/23/2009 indicated that in Sept 2010: "HEpc serologies positive but Hep C Quantitative negative" and esr 76, crp 28 p-anca 1:160, mpo, pr3 abs + c-anca neg . However, she had neck rash at that time and they thought twas "urticaria Multiforme""    Then in 03/16/13 developed acute left red eyde with swelling and then since then intermittent recurrence of dyspnea, dry cough, chest soreness with arthralgia.  She was initally seen in urgent care who had her follow with DR Dennard Schaumann which she did on 05/13/13. He ordered autoimmune profile and noted ESR 121, ANA at 1:640 and P-ANCA at 1:80 but normal  complements and DS-DNA. He was concerned about "SLE "flare up /r recurrence and placed her on prednisone.    He followed her again 05/20/13 and noted cough resolved.  She saw Der Melvyn Novas 05/24/13 who placed her on PPI with pred taper to off.  HE shared concern for vasculitis given lab results but deferred to [facility] because patient was set up for [facility]. She saw PCP [facility], MD on 06/02/13 and was off prednisone at this time and noted to have cough, congestion, dyspnea, sinus issues and right pleuristy.  CXR was clear (personally reviewed)  On Exam she was Wheezing and had rales.  HE treated with levaquin but she did not improve. HE gave her prednisone and wheezing resolved and she felt well without wheeze when he saw her again 06/09/13   She then followed at [facility] Dr Idamae Schuller: a tthis time off prednisone for 10 days. She was very poor historian at this visit but her cough had recurred with clear sputum with fatigue, itchy skin, mouth sores, conjunctivies and along with symptoms of slep anpnea. Also, admitted to severe GERD and sinus congestion. PFT at this visit was essentialy normal.    She has needed repeated and intermittent prednisone courses. REports this is only thing that helps. Has seen Dr Idamae Schuller at [facility] when off prednisone but PFT near normal and apparently she has sleep study pending there. SHe has been told off different autoimmune disease at urgent care and other places  that she is confused. Currently back on prednisone. Dr Estanislado Pandy has referred her to me so she can have a local pulmonologist but she is ok with just following at one place ([facility] [place] v both). She is frustrated she is seeing many docs but at same time has very poor insight into her problems    Autoimmune May 14, 2013 - no on seroids  P  ANCA 1:80 and positive, ANA 1:640, ESR 121 (was 19 in Jan 2015)  DS-DNA, C Anca and complement - normal - feb 2015   UA 06/02/13 - just came off steroids   - no hematuria  PFT June 28, 2013 at [facility] - done not on prednisone  - FVC 2.8L/94%, TLC 3.4/70%, DLCO 15.4/82%. Spirometry with flow volume loops demonstrates no airway obstruction. Lung  volumes are consistent with mild restrictive lung disease. The diffusing  capacity for carbon monoxide, a reflection of alveolar-capillary gas  transport, is normal.   CBC 2010 t0 April 2015: eos 2,6%, 3% and 0%  Reviewed Dr Ralene Cork notes from 07/22/13  Autoimmune labs 07/22/13  -P-ANCA, +ve @ 1:640, MPO > 8, ESR 49, ANA positive  @ 1:640, Cardiolipin IgM - 17 and high - C-ANCA, PR-3, CK 31, HIV , RF ,DSDNA, CCp, ssA, ssB, ENA, RNP, scl-70  - ALL negative - Hep C anitbody - REACTIVE.   - HepBSag - negative, Quantiferon Gold - Negative  CT chest 08/03/13 - done on  2 weeks pf prednisone EXAM:  CT CHEST WITHOUT CONTRAST  TECHNIQUE: CT chest 08/03/13 Multidetector CT imaging of the chest was performed following the  standard protocol without intravenous contrast. High resolution  imaging of the lungs, as well as inspiratory and expiratory imaging,  was performed.  COMPARISON: 12/04/2008.  FINDINGS:  No pathologically enlarged mediastinal lymph nodes. Hilar regions  are difficult to definitively evaluate but appear grossly  unremarkable. No axillary adenopathy. Heart size within normal  limits. There may be a small amount of pericardial fluid or  thickening, new from the prior exam. Small hiatal hernia.  Image quality is somewhat degraded by respiratory motion. Very mild  subpleural reticulation is seen in the right lung, without definite  zonal predominance. Very minimal involvement of the lingula and left  lower lobe (example series 8 and image 21). Findings are unchanged  from 12/04/2008. No traction bronchiectasis/ bronchiolectasis,  ground-glass, architectural distortion or honeycombing. No air  trapping on inspiratory and expiratory imaging. No pleural fluid.  Airway is unremarkable.  Incidental imaging of the  upper abdomen shows the visualized  portions of the liver, gallbladder, adrenal glands, kidneys, spleen,  pancreas, stomach and bowel to be grossly unremarkable. No upper  abdominal adenopathy. No worrisome lytic or sclerotic lesions.  Degenerative changes are seen in the spine.  IMPRESSION:  1. Minimal subpleural reticulation, right greater than left,  unchanged from 12/04/2008. 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  2. Question small amount of pericardial fluid or thickening, new.  Electronically Signed  By: Lorin Picket M.D.  On: 08/03/2013 12:07      ASSESSMENT Tough case. Poor history and intermittent steroids during data point eval for pulmonary making it hard to arrive at specific pulmonary diagnosis. However, serial data at 3 points (2010, feb 2015, may 2015) clearly demonstrate when symptomatic with respiratory issues her ESR is high, she is P-ANCA screen positive and c/w wit this MPO antibody positive. Her symptoms respond to steroids. Taken together, strongly c/w MPA disease -  Vasculitis.   However, fact April 2015 PFT at [facility] was normal (done between steroid courses), and CT Chest 08/03/13 showed very mild early and subtle ILD (so subtle not reflected in PFT) that is unchanged since 2010. This suggests pulmonary symptoms are airway related and c/w this she ws noticed to have wheezing in march 2015 PCP visit.  Unfortunately we do not have PFT when symptoms were active ([facility] April 2015, done when 10 days off prednisone so not fully reflective of being off prednisone)  DDx is cryoglobulinemia (prior leukocytoclastic vasculitis and Hep C positive), Erick Alley - I think these are unlikely but will leave it to Dr Bronson Curb to make call on this   Rx PLAN  - It would be nice to have tissue diagnosis of vasculitis but will need it from nose or airway when symptomatic (these sites are @ risk for false negative). Or, will have to biopsy from renal or  lungs if she were to worsen  -  Would continue prednisone -given obesity will be nice to find the lowest floor dose; will let Dr Estanislado Pandy know this   - ? Does she need 2nd immunomodulator. In 2015: Given no frank alveolar hemorrhage, stable creatinine, no hematuria, normal PFT, CT showing stable ILD since 2010 (though lot of these data points have been elicited with her on prednisone or off prednisone for several days only) - perhaps no role for rituxan/cytoxan but for methotrexate  - we can consolidate followup here at [place]   OV 09/12/2013  Chief Complaint  Patient presents with  . Follow-up    Pt states her breathing is doing better. C/o intermittent dyspnea with activity. Denies cough and CP.     dyspnea better with prednisone but worse when pred tapered. Class 2 doe. Wants to increase prednisone. No new issues. Continues to be a poort historian. Has sleep apnea workup pending at [facility]   Current outpatient prescriptions:calcium citrate (CALCITRATE - DOSED IN MG ELEMENTAL CALCIUM) 950 MG tablet, Take 200 mg of elemental calcium by mouth daily., Disp: , Rfl: ;  cetirizine (ZYRTEC) 10 MG tablet, Take 1 tablet by mouth daily., Disp: , Rfl: ;  diltiazem (CARDIZEM) 30 MG tablet, Take 1 tablet (30 mg total) by mouth as needed (for palpitations)., Disp: 30 tablet, Rfl: 1 esomeprazole (NEXIUM) 40 MG capsule, Take 1 capsule (40 mg total) by mouth daily at 12 noon., Disp: 30 capsule, Rfl: 2;  potassium chloride (K-DUR,KLOR-CON) 10 MEQ tablet, Take 10 mEq by mouth daily., Disp: , Rfl: ;  predniSONE (DELTASONE) 10 MG tablet, Take 3 tablets (30 mg total) by mouth daily with breakfast., Disp: 90 tablet, Rfl: 0;  sertraline (ZOLOFT) 100 MG tablet, Take 1 tablet (100 mg total) by mouth at bedtime., Disp: 90 tablet, Rfl: 1   Review of Systems  Constitutional: Negative for fever and unexpected weight change.  HENT: Positive for congestion and sinus pressure. Negative for dental problem, ear  pain, nosebleeds, postnasal drip, rhinorrhea, sneezing, sore throat and trouble swallowing.   Eyes: Negative for redness and itching.  Respiratory: Positive for shortness of breath. Negative for cough, chest tightness and wheezing.   Cardiovascular: Positive for palpitations and leg swelling.  Gastrointestinal: Negative for nausea and vomiting.  Genitourinary: Negative for dysuria.  Musculoskeletal: Negative for joint swelling.  Skin: Negative for rash.  Neurological: Negative for headaches.  Hematological: Does not bruise/bleed easily.  Psychiatric/Behavioral: Negative for dysphoric mood. The patient is not nervous/anxious.        Objective:  Physical Exam   Filed Vitals:   09/12/13 1615  BP: 142/84  Pulse: 100  Height: $Remove'5\' 3"'[id number]$  (1.6 m)  Weight: 246 lb (111.585 kg)  SpO2: 97%   itals reviewed. Constitutional: She is oriented to person, place, and time. She appears well-developed and well-nourished. No distress.  Body mass index is 42.17 kg/(m^2).   HENT:  Head: Normocephalic and atraumatic.  Right Ear: External ear normal.  Left Ear: External ear normal.  Mouth/Throat: Oropharynx is clear and moist. No oropharyngeal exudate.  Eyes: Conjunctivae and EOM are normal. Pupils are equal, round, and reactive to light. Right eye exhibits no discharge. Left eye exhibits no discharge. No scleral icterus.  Neck: Normal range of motion. Neck supple. No JVD present. No tracheal deviation present. No thyromegaly present.  Cardiovascular: Normal rate, regular rhythm, normal heart sounds and intact distal pulses.  Exam reveals no gallop and no friction rub.   No murmur heard. Pulmonary/Chest: Effort normal and breath sounds normal. No respiratory distress. She has no wheezes. She has no rales. She exhibits no tenderness.  Abdominal: Soft. Bowel sounds are normal. She exhibits no distension and no mass. There is no tenderness. There is no rebound and no guarding.  Musculoskeletal: Normal range  of motion. She exhibits no edema and no tenderness.  Lymphadenopathy:    She has no cervical adenopathy.  Neurological: She is alert and oriented to person, place, and time. She has normal reflexes. No cranial nerve deficit. She exhibits normal muscle tone. Coordination normal.  Skin: Skin is warm and dry. No rash noted. She is not diaphoretic. No erythema. No pallor.  Psychiatric:  Flat affect Poor historian      Assessment & Plan:

## 2013-09-17 NOTE — Assessment & Plan Note (Addendum)
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

## 2013-10-11 ENCOUNTER — Encounter: Payer: BC Managed Care – PPO | Admitting: Family Medicine

## 2013-10-17 ENCOUNTER — Encounter: Payer: Self-pay | Admitting: Family Medicine

## 2013-10-17 DIAGNOSIS — M310 Hypersensitivity angiitis: Secondary | ICD-10-CM | POA: Insufficient documentation

## 2013-10-31 ENCOUNTER — Ambulatory Visit: Payer: BC Managed Care – PPO | Admitting: Internal Medicine

## 2013-11-16 ENCOUNTER — Ambulatory Visit (INDEPENDENT_AMBULATORY_CARE_PROVIDER_SITE_OTHER): Payer: BC Managed Care – PPO | Admitting: Internal Medicine

## 2013-11-16 ENCOUNTER — Encounter: Payer: Self-pay | Admitting: Internal Medicine

## 2013-11-16 VITALS — BP 152/98 | HR 83 | Ht 63.0 in | Wt 254.0 lb

## 2013-11-16 DIAGNOSIS — R0602 Shortness of breath: Secondary | ICD-10-CM

## 2013-11-16 DIAGNOSIS — R05 Cough: Secondary | ICD-10-CM

## 2013-11-16 DIAGNOSIS — I7782 Antineutrophilic cytoplasmic antibody (ANCA) vasculitis: Secondary | ICD-10-CM

## 2013-11-16 DIAGNOSIS — I776 Arteritis, unspecified: Secondary | ICD-10-CM

## 2013-11-16 DIAGNOSIS — R059 Cough, unspecified: Secondary | ICD-10-CM

## 2013-11-16 NOTE — Patient Instructions (Addendum)
#  ANCA vasculitis Continue  prednisone at  per day per DR Corliss Skains Continue methotrexate injections per DR Corliss Skains   #shortness of breath  - some of this is due to anca vasculitis but some of it is weight  - follow low glycemic diet sheet - just eat foods in left lane and lose weight  #Sleep apnea  - see one of our sleep docs; refer to one of [place] here and then cancel going to [facility]  #Followup  4 months or sooner if needed

## 2013-11-16 NOTE — Progress Notes (Signed)
Subjective:    Patient ID: Cassandra [name], female    DOB: 1955-09-26, 58 y.o.   MRN: [id number]  HPI   OV 08/09/2013  Chief Complaint  Patient presents with  . Pulomary Consult    Transition of care from MW to MR. C/o SOB with activity but since on pred pt is able to do more activity wihtout becoming dyspneic. Denies CP and cough.     Very poor historian. Significant cotton dust exposuire +  Says in 2010 had episodic dyspnea, cough, chest soreness that Rx with intermittent prednisone. REcollects pulmonary eval at that time. Details not clear. Denies having had bronch. Unclear how long she took prednisone.  However, in review of Dr Estanislado Pandy notes patient had bronchitis in dec 2010 and then developed rash (LE, UE) after levaquin. 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 Apparently at that time per PCP notes Labs at that time were ANA negative, ESR of 44, P-ANCA 1:160, MPO abs 27.5, PR-3 abs 4.9  Were all the positive ones. She had microscopic hematuria but normal creat 11/30/2008 (result in epic) SHe followed with DR Halford Chessman, renal and even pulmnary [facility] and derm at [facility] (Derm notes 2011 at [facility] reviewed). It appears she took prednisone for several months and then stopped. She then felt well and Symptoms resolved by 2012 and was doing well. Also lost to followup due to loss of insurance. [facility] derm noets 08/23/2009 indicated that in Sept 2010: "HEpc serologies positive but Hep C Quantitative negative" and esr 76, crp 28 p-anca 1:160, mpo, pr3 abs + c-anca neg . However, she had neck rash at that time and they thought twas "urticaria Multiforme""    Then in 03/16/13 developed acute left red eyde with swelling and then since then intermittent recurrence of dyspnea, dry cough, chest soreness with arthralgia.  She was initally seen in urgent care who had her follow with DR Dennard Schaumann which she did on 05/13/13. He ordered autoimmune profile and noted ESR 121, ANA at 1:640 and P-ANCA at 1:80 but normal  complements and DS-DNA. He was concerned about "SLE "flare up /r recurrence and placed her on prednisone.    He followed her again 05/20/13 and noted cough resolved.  She saw Der Melvyn Novas 05/24/13 who placed her on PPI with pred taper to off.  HE shared concern for vasculitis given lab results but deferred to [facility] because patient was set up for [facility]. She saw PCP [facility] T[facility], MD on 06/02/13 and was off prednisone at this time and noted to have cough, congestion, dyspnea, sinus issues and right pleuristy.  CXR was clear (personally reviewed)  On Exam she was Wheezing and had rales.  HE treated with levaquin but she did not improve. HE gave her prednisone and wheezing resolved and she felt well without wheeze when he saw her again 06/09/13   She then followed at [facility] Dr Idamae Schuller: a tthis time off prednisone for 10 days. She was very poor historian at this visit but her cough had recurred with clear sputum with fatigue, itchy skin, mouth sores, conjunctivies and along with symptoms of slep anpnea. Also, admitted to severe GERD and sinus congestion. PFT at this visit was essentialy normal.    She has needed repeated and intermittent prednisone courses. REports this is only thing that helps. Has seen Dr Idamae Schuller at [facility] when off prednisone but PFT near normal and apparently she has sleep study pending there. SHe has been told off different autoimmune disease at urgent care and other places  that she is confused. Currently back on prednisone. Dr Estanislado Pandy has referred her to me so she can have a local pulmonologist but she is ok with just following at one place ([facility] both). She is frustrated she is seeing many docs but at same time has very poor insight into her problems    Autoimmune May 14, 2013 - no on seroids  P  ANCA 1:80 and positive, ANA 1:640, ESR 121 (was 19 in Jan 2015)  DS-DNA, C Anca and complement - normal - feb 2015   UA 06/02/13 - just came off steroids   - no hematuria  PFT June 28, 2013 at [facility] - done not on prednisone  - FVC 2.8L/94%, TLC 3.4/70%, DLCO 15.4/82%. Spirometry with flow volume loops demonstrates no airway obstruction. Lung  volumes are consistent with mild restrictive lung disease. The diffusing  capacity for carbon monoxide, a reflection of alveolar-capillary gas  transport, is normal.   CBC 2010 t0 April 2015: eos 2,6%, 3% and 0%  Reviewed Dr Ralene Cork notes from 07/22/13  Autoimmune labs 07/22/13  -P-ANCA, +ve @ 1:640, MPO > 8, ESR 49, ANA positive  @ 1:640, Cardiolipin IgM - 17 and high - C-ANCA, PR-3, CK 31, HIV , RF ,DSDNA, CCp, ssA, ssB, ENA, RNP, scl-70  - ALL negative - Hep C anitbody - REACTIVE.   - HepBSag - negative, Quantiferon Gold - Negative  CT chest 08/03/13 - done on  2 weeks pf prednisone EXAM:  CT CHEST WITHOUT CONTRAST  TECHNIQUE: CT chest 08/03/13 Multidetector CT imaging of the chest was performed following the  standard protocol without intravenous contrast. High resolution  imaging of the lungs, as well as inspiratory and expiratory imaging,  was performed.  COMPARISON: 12/04/2008.  FINDINGS:  No pathologically enlarged mediastinal lymph nodes. Hilar regions  are difficult to definitively evaluate but appear grossly  unremarkable. No axillary adenopathy. Heart size within normal  limits. There may be a small amount of pericardial fluid or  thickening, new from the prior exam. Small hiatal hernia.  Image quality is somewhat degraded by respiratory motion. Very mild  subpleural reticulation is seen in the right lung, without definite  zonal predominance. Very minimal involvement of the lingula and left  lower lobe (example series 8 and image 21). Findings are unchanged  from 12/04/2008. No traction bronchiectasis/ bronchiolectasis,  ground-glass, architectural distortion or honeycombing. No air  trapping on inspiratory and expiratory imaging. No pleural fluid.  Airway is unremarkable.  Incidental imaging of the  upper abdomen shows the visualized  portions of the liver, gallbladder, adrenal glands, kidneys, spleen,  pancreas, stomach and bowel to be grossly unremarkable. No upper  abdominal adenopathy. No worrisome lytic or sclerotic lesions.  Degenerative changes are seen in the spine.  IMPRESSION:  1. Minimal subpleural reticulation, right greater than left,  unchanged from 12/04/2008. 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  2. Question small amount of pericardial fluid or thickening, new.  Electronically Signed  By: Lorin Picket M.D.  On: 08/03/2013 12:07      ASSESSMENT Tough case. Poor history and intermittent steroids during data point eval for pulmonary making it hard to arrive at specific pulmonary diagnosis. However, serial data at 3 points (2010, feb 2015, may 2015) clearly demonstrate when symptomatic with respiratory issues her ESR is high, she is P-ANCA screen positive and c/w wit this MPO antibody positive. Her symptoms respond to steroids. Taken together, strongly c/w MPA disease -  Vasculitis.   However, fact April 2015 PFT at [facility] was normal (done between steroid courses), and CT Chest 08/03/13 showed very mild early and subtle ILD (so subtle not reflected in PFT) that is unchanged since 2010. This suggests pulmonary symptoms are airway related and c/w this she ws noticed to have wheezing in march 2015 PCP visit.  Unfortunately we do not have PFT when symptoms were active ([facility] PFt April 2015, done when 10 days off prednisone so not fully reflective of being off prednisone)  DDx is cryoglobulinemia (prior leukocytoclastic vasculitis and Hep C positive), Erick Alley - I think these are unlikely but will leave it to Dr Bronson Curb to make call on this   Rx PLAN  - It would be nice to have tissue diagnosis of vasculitis but will need it from nose or airway when symptomatic (these sites are @ risk for false negative). Or, will have to biopsy from renal or  lungs if she were to worsen  -  Would continue prednisone -given obesity will be nice to find the lowest floor dose; will let Dr Estanislado Pandy know this   - ? Does she need 2nd immunomodulator. In 2015: Given no frank alveolar hemorrhage, stable creatinine, no hematuria, normal PFT, CT showing stable ILD since 2010 (though lot of these data points have been elicited with her on prednisone or off prednisone for several days only) - perhaps no role for rituxan/cytoxan but for methotrexate  - we can consolidate followup here at [place]   OV 09/12/2013  Chief Complaint  Patient presents with  . Follow-up    Pt states her breathing is doing better. C/o intermittent dyspnea with activity. Denies cough and CP.     dyspnea better with prednisone but worse when pred tapered. Class 2 doe. Wants to increase prednisone. No new issues. Continues to be a poort historian. Has sleep apnea workup pending at [place] 11/16/2013  Chief Complaint  Patient presents with  . Follow-up    Pt states her breathing is unchanged since last OV. Pt c/o DOE, mild dry cough. Pt denies CP/tightness.     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 This is being managed by her rheumatologist. Looks like she's also been started on injection methotrexate which is currently on hold because she's having some dental work at [employer]. Her dyspnea continues even though it is slightly better with prednisone. She has a sense that her obesity with a body mass index of 42 is playing a role and dyspnea. She is trying to lose weight and try to exercise more but is struggling with executing this. She had sleep apnea workup at [facility] and has been diagnosed with moderate sleep apnea. She is on CPAP. She wants to switch sleep apnea care to [place] @  due to logistical reasons  Otherwise no new problems   Review  of Systems  Constitutional: Negative for fever and unexpected weight change.  HENT: Negative for congestion, dental problem, ear pain, nosebleeds, postnasal drip, rhinorrhea, sinus pressure, sneezing, sore throat and trouble swallowing.   Eyes: Negative for redness and itching.  Respiratory: Positive for cough and shortness of breath. Negative for chest tightness and wheezing.   Cardiovascular: Negative for palpitations and leg swelling.  Gastrointestinal: Negative for nausea and vomiting.  Genitourinary: Negative for dysuria.  Musculoskeletal: Negative for joint swelling.  Skin: Negative for rash.  Neurological: Negative for headaches.  Hematological: Does not bruise/bleed easily.  Psychiatric/Behavioral: Negative for dysphoric mood. The patient is not nervous/anxious.    Current outpatient prescriptions:calcium citrate (CALCITRATE - DOSED IN MG ELEMENTAL CALCIUM) 950 MG tablet, Take 200 mg of elemental calcium by mouth daily., Disp: , Rfl: ;  cetirizine (ZYRTEC) 10 MG tablet, Take 1 tablet by mouth daily., Disp: , Rfl: ;  diltiazem (CARDIZEM) 30 MG tablet, Take 1 tablet (30 mg total) by mouth as needed (for palpitations)., Disp: 30 tablet, Rfl: 1 esomeprazole (NEXIUM) 40 MG capsule, Take 1 capsule (40 mg total) by mouth daily at 12 noon., Disp: 30 capsule, Rfl: 2;  potassium chloride (K-DUR,KLOR-CON) 10 MEQ tablet, Take 10 mEq by mouth daily., Disp: , Rfl: ;  predniSONE (DELTASONE) 10 MG tablet, Take 20 mg by mouth daily with breakfast., Disp: , Rfl: ;  Probiotic Product (PROBIOTIC DAILY PO), Take by mouth., Disp: , Rfl:  sertraline (ZOLOFT) 100 MG tablet, Take 1 tablet (100 mg total) by mouth at bedtime., Disp: 90 tablet, Rfl: 1     Objective:   Physical Exam  Filed Vitals:   11/16/13 1620  BP: 152/98  Pulse: 83  Height: 5' 3" (1.6 m)  Weight: 254 lb (115.214 kg)  SpO2: 94%     itals reviewed. Constitutional: She is oriented to person, place, and time. She appears well-developed  and well-nourished. No distress.  Body mass index is 42.17 kg/(m^2).   HENT:  Head: Normocephalic and atraumatic.  Right Ear: External ear normal.  Left Ear: External ear normal.  Mouth/Throat: Oropharynx is clear and moist. No oropharyngeal exudate.  Eyes: Conjunctivae and EOM are normal. Pupils are equal, round, and reactive to light. Right eye exhibits no discharge. Left eye exhibits no discharge. No scleral icterus.  Neck: Normal range of motion. Neck supple. No JVD present. No tracheal deviation present. No thyromegaly present.  Cardiovascular: Normal rate, regular rhythm, normal heart sounds and intact distal pulses.  Exam reveals no gallop and no friction rub.   No murmur heard. Pulmonary/Chest: Effort normal and breath sounds normal. No respiratory distress. She has no wheezes. She has no rales. She exhibits no tenderness.  Abdominal: Soft. Bowel sounds are normal. She exhibits no distension and no mass. There is no tenderness. There is no rebound and no guarding.  Musculoskeletal: Normal range of motion. She exhibits no edema and no tenderness.  Lymphadenopathy:    She has no cervical adenopathy.  Neurological: She is alert and oriented to person, place, and time. She has normal reflexes. No cranial nerve deficit. She exhibits normal muscle tone. Coordination normal.  Skin: Skin is warm and dry. No rash noted. She is not diaphoretic. No erythema. No pallor.  Psychiatric:  Flat affect but more cheerful Poor historian          Assessment & Plan:  #ANCA vasculitis Continue  prednisone at 66m per day per DR DEstanislado PandyContinue methotrexate injections per DR DEstanislado Pandy  #shortness of breath  - some of this is due to anca vasculitis but some of it is weight  - follow low glycemic diet sheet - just eat foods in left lane and lose weight  #Sleep apnea  - see one of our sleep docs; refer to one of [place] and then cancel going to [facility]  #Followup  4 months or sooner if needed

## 2013-12-02 ENCOUNTER — Encounter: Payer: Self-pay | Admitting: Family Medicine

## 2013-12-02 ENCOUNTER — Ambulatory Visit
Admission: RE | Admit: 2013-12-02 | Discharge: 2013-12-02 | Disposition: A | Discharge status: Home / Self Care | Payer: BC Managed Care – PPO | Source: Ambulatory Visit | Attending: Family Medicine | Admitting: Family Medicine

## 2013-12-02 ENCOUNTER — Ambulatory Visit (INDEPENDENT_AMBULATORY_CARE_PROVIDER_SITE_OTHER): Payer: BC Managed Care – PPO | Admitting: Family Medicine

## 2013-12-02 VITALS — BP 118/74 | HR 84 | Temp 97.6°F | Resp 22 | Ht 63.0 in | Wt 254.0 lb

## 2013-12-02 DIAGNOSIS — M25579 Pain in unspecified ankle and joints of unspecified foot: Secondary | ICD-10-CM

## 2013-12-02 DIAGNOSIS — M25572 Pain in left ankle and joints of left foot: Secondary | ICD-10-CM

## 2013-12-02 NOTE — Progress Notes (Signed)
Subjective:    Patient ID: Cassandra [name], female    DOB: January 09, 1956, 58 y.o.   MRN: [id number]  HPI Patient presents with 2 days of pain in her left foot. She has exquisite tenderness near the navicular bone. She has pain in her arch. She has no pain over the fifth metatarsal. She has no pain over the toes or in the calcaneus. She has no pain over the lateral or medial malleolus. She is range of motion in the ankle. She denies any specific injury. She is on chronic corticosteroids for her vasculitis putting her at high risk for stress fractures. There is no bruising or swelling of the foot. Past Medical History  Diagnosis Date  . Allergy   . Anxiety   . Depression   . GERD (gastroesophageal reflux disease)   . SVT (supraventricular tachycardia)   . Wegner's disease (congenital syphilitic osteochondritis)   . Leukocytoclastic vasculitis     Dr. Marchelle Gearing and Dr. Corliss Skains   Past Surgical History  Procedure Laterality Date  . Tonsillectomy  08/2002  . Carpal tunnel release Right 2002  . Tubal ligation  1980  . Total knee arthroplasty Right   . Bunionectomy Bilateral    Current Outpatient Prescriptions on File Prior to Visit  Medication Sig Dispense Refill  . calcium citrate (CALCITRATE - DOSED IN MG ELEMENTAL CALCIUM) 950 MG tablet Take 200 mg of elemental calcium by mouth daily.      . cetirizine (ZYRTEC) 10 MG tablet Take 1 tablet by mouth daily.      Marland Kitchen diltiazem (CARDIZEM) 30 MG tablet Take 1 tablet (30 mg total) by mouth as needed (for palpitations).  30 tablet  1  . esomeprazole (NEXIUM) 40 MG capsule Take 1 capsule (40 mg total) by mouth daily at 12 noon.  30 capsule  2  . potassium chloride (K-DUR,KLOR-CON) 10 MEQ tablet Take 10 mEq by mouth daily.      . predniSONE (DELTASONE) 10 MG tablet Take 20 mg by mouth daily with breakfast.      . Probiotic Product (PROBIOTIC DAILY PO) Take by mouth.      . sertraline (ZOLOFT) 100 MG tablet Take 1 tablet (100 mg total) by mouth at  bedtime.  90 tablet  1   No current facility-administered medications on file prior to visit.   Allergies  Allergen Reactions  . Ampicillin     Rash   . Clarithromycin     unknown  . Codeine     Unknown   . Effexor [Venlafaxine]     Hives   . Flagyl [Metronidazole]     unknown  . Fluoxetine Hcl Itching    Deep Itch   . Myrbetriq [Mirabegron]     Caused back pain  . Nsaids Other (See Comments)    Hypersensitivity vasculitis  . Paxil [Paroxetine]   . Reglan [Metoclopramide]     Blister in mouth   . Sulfa Antibiotics     Stomach irritation  . Tetracycline     Severe stomach pain   History   Social History  . Marital Status: Divorced    Spouse Name: N/A    Number of Children: N/A  . Years of Education: N/A   Occupational History  . Unemployed    Social History Main Topics  . Smoking status: Never Smoker   . Smokeless tobacco: Never Used  . Alcohol Use: No  . Drug Use: No  . Sexual Activity: Not on file   Other Topics Concern  .  Not on file   Social History Narrative  . No narrative on file      Review of Systems  All other systems reviewed and are negative.      Objective:   Physical Exam  Vitals reviewed. Cardiovascular: Normal rate and regular rhythm.   Pulmonary/Chest: Effort normal and breath sounds normal.  Musculoskeletal:       Left foot: She exhibits tenderness and bony tenderness. She exhibits normal range of motion, no swelling, normal capillary refill, no crepitus, no deformity and no laceration.          Assessment & Plan:  Pain in joint, ankle and foot, left - Plan: DG Foot Complete Left   Schedule patient for an x-ray of her foot to make sure there is no stress fracture. The differential diagnosis includes a stress fracture in the foot versus a sprain of the midfoot. I recommended rest ice elevation. If the patient has a stress fracture, put her in a cam walker and follow x-rays to ensure clinical resolution.

## 2013-12-26 ENCOUNTER — Institutional Professional Consult (permissible substitution): Payer: BC Managed Care – PPO | Admitting: Pulmonary Disease

## 2014-01-09 ENCOUNTER — Ambulatory Visit: Payer: BC Managed Care – PPO | Admitting: Podiatry

## 2014-01-13 ENCOUNTER — Other Ambulatory Visit: Payer: Self-pay | Admitting: Orthopaedic Surgery

## 2014-01-27 ENCOUNTER — Ambulatory Visit (INDEPENDENT_AMBULATORY_CARE_PROVIDER_SITE_OTHER): Payer: BC Managed Care – PPO | Admitting: Family Medicine

## 2014-01-27 ENCOUNTER — Encounter: Payer: Self-pay | Admitting: Family Medicine

## 2014-01-27 VITALS — BP 136/84 | HR 94 | Temp 97.7°F | Resp 20 | Ht 63.0 in | Wt 258.0 lb

## 2014-01-27 DIAGNOSIS — J069 Acute upper respiratory infection, unspecified: Secondary | ICD-10-CM

## 2014-01-27 MED ORDER — AZITHROMYCIN 250 MG PO TABS: ORAL_TABLET | ORAL | Status: DC

## 2014-01-27 NOTE — Progress Notes (Signed)
Subjective:    Patient ID: Cassandra [name], female    DOB: 10/21/1955, 58 y.o.   MRN: [id number]  HPI Patient is a very pleasant 58 year old white female with a history of ANCA vasculitis. She is currently on chronic prednisone 8 milligrams by mouth daily. She is also supposed to be on methotrexate but she has discontinued this by accident. I distracted the patient to call her rheumatologist to determine whether or not she needs to resume the methotrexate. She has been doing very well on chronic immunosuppressives until recently. 5 days ago, she developed a runny nose, itchy sore throat, nonproductive cough. She denies any fever. She denies any wheezing. She denies any sinus pain. She denies any shortness of breath. She denies any nausea vomiting or diarrhea. She denies any hemoptysis. Past Medical History  Diagnosis Date  . Allergy   . Anxiety   . Depression   . GERD (gastroesophageal reflux disease)   . SVT (supraventricular tachycardia)   . Wegner's disease (congenital syphilitic osteochondritis)   . Leukocytoclastic vasculitis     Dr. Marchelle Gearingamaswamy and Dr. Corliss Skainseveshwar   Past Surgical History  Procedure Laterality Date  . Tonsillectomy  08/2002  . Carpal tunnel release Right 2002  . Tubal ligation  1980  . Total knee arthroplasty Right   . Bunionectomy Bilateral    Current Outpatient Prescriptions on File Prior to Visit  Medication Sig Dispense Refill  . calcium citrate (CALCITRATE - DOSED IN MG ELEMENTAL CALCIUM) 950 MG tablet Take 200 mg of elemental calcium by mouth daily.    . cetirizine (ZYRTEC) 10 MG tablet Take 1 tablet by mouth daily.    Marland Kitchen. diltiazem (CARDIZEM) 30 MG tablet Take 1 tablet (30 mg total) by mouth as needed (for palpitations). 30 tablet 1  . esomeprazole (NEXIUM) 40 MG capsule Take 1 capsule (40 mg total) by mouth daily at 12 noon. 30 capsule 2  . potassium chloride (K-DUR,KLOR-CON) 10 MEQ tablet Take 10 mEq by mouth daily.    . Probiotic Product (PROBIOTIC DAILY PO)  Take by mouth.    . sertraline (ZOLOFT) 100 MG tablet Take 1 tablet (100 mg total) by mouth at bedtime. 90 tablet 1   No current facility-administered medications on file prior to visit.   Allergies  Allergen Reactions  . Ampicillin     Rash   . Clarithromycin     unknown  . Codeine     Unknown   . Effexor [Venlafaxine]     Hives   . Flagyl [Metronidazole]     unknown  . Fluoxetine Hcl Itching    Deep Itch   . Myrbetriq [Mirabegron]     Caused back pain  . Nsaids Other (See Comments)    Hypersensitivity vasculitis  . Paxil [Paroxetine]   . Reglan [Metoclopramide]     Blister in mouth   . Sulfa Antibiotics     Stomach irritation  . Tetracycline     Severe stomach pain   History   Social History  . Marital Status: Divorced    Spouse Name: N/A    Number of Children: N/A  . Years of Education: N/A   Occupational History  . Unemployed    Social History Main Topics  . Smoking status: Never Smoker   . Smokeless tobacco: Never Used  . Alcohol Use: No  . Drug Use: No  . Sexual Activity: Not on file   Other Topics Concern  . Not on file   Social History Narrative  Review of Systems  All other systems reviewed and are negative.      Objective:   Physical Exam  Constitutional: She appears well-developed and well-nourished. No distress.  HENT:  Right Ear: External ear normal.  Left Ear: External ear normal.  Nose: Nose normal.  Mouth/Throat: Oropharynx is clear and moist. No oropharyngeal exudate.  Eyes: Conjunctivae are normal.  Neck: Neck supple.  Cardiovascular: Normal rate, regular rhythm and normal heart sounds.   Pulmonary/Chest: Effort normal and breath sounds normal. No respiratory distress. She has no wheezes. She has no rales.  Lymphadenopathy:    She has no cervical adenopathy.  Skin: She is not diaphoretic.  Vitals reviewed.         Assessment & Plan:  Acute upper respiratory infection - Plan: azithromycin (ZITHROMAX) 250 MG  tablet  Even with a complicated past medical history, I explained to the patient that is possible for her simply to have a "plain old cold."  That is exactly what I believe she has. I believe this is viral in nature. I recommended tincture of time. I recommended Sudafed for congestion. I recommended Mucinex for cough and chest congestion. I did give the patient a prescription for a Z-Pak and explained to her to get this prescription if she develops a high fever or cough productive of purulent sputum. I would have a low threshold to start this patient on antibiotics given her significant medical history and chronic immunosuppressives

## 2014-01-31 ENCOUNTER — Other Ambulatory Visit: Payer: Self-pay | Admitting: Family Medicine

## 2014-02-13 ENCOUNTER — Encounter (HOSPITAL_COMMUNITY)
Admission: RE | Admit: 2014-02-13 | Discharge: 2014-02-13 | Disposition: A | Discharge status: Home / Self Care | Payer: BC Managed Care – PPO | Source: Ambulatory Visit | Attending: Orthopaedic Surgery | Admitting: Orthopaedic Surgery

## 2014-02-13 ENCOUNTER — Encounter (HOSPITAL_COMMUNITY): Payer: Self-pay

## 2014-02-13 DIAGNOSIS — Z01812 Encounter for preprocedural laboratory examination: Secondary | ICD-10-CM | POA: Insufficient documentation

## 2014-02-13 DIAGNOSIS — Z79899 Other long term (current) drug therapy: Secondary | ICD-10-CM | POA: Insufficient documentation

## 2014-02-13 DIAGNOSIS — Z7952 Long term (current) use of systemic steroids: Secondary | ICD-10-CM | POA: Insufficient documentation

## 2014-02-13 DIAGNOSIS — I471 Supraventricular tachycardia: Secondary | ICD-10-CM | POA: Diagnosis not present

## 2014-02-13 DIAGNOSIS — A5002 Early congenital syphilitic osteochondropathy: Secondary | ICD-10-CM | POA: Diagnosis not present

## 2014-02-13 DIAGNOSIS — I776 Arteritis, unspecified: Secondary | ICD-10-CM | POA: Diagnosis not present

## 2014-02-13 HISTORY — DX: Other complications of anesthesia, initial encounter: T88.59XA

## 2014-02-13 HISTORY — DX: Migraine, unspecified, not intractable, without status migrainosus: G43.909

## 2014-02-13 HISTORY — DX: Sleep apnea, unspecified: G47.30

## 2014-02-13 HISTORY — DX: Cardiac arrhythmia, unspecified: I49.9

## 2014-02-13 HISTORY — DX: Adverse effect of unspecified anesthetic, initial encounter: T41.45XA

## 2014-02-13 HISTORY — DX: Cardiac murmur, unspecified: R01.1

## 2014-02-13 LAB — ABO/RH: ABO/RH(D): A NEG

## 2014-02-13 LAB — CBC WITH DIFFERENTIAL/PLATELET
Basophils Absolute: 0 10*3/uL (ref 0.0–0.1)
Basophils Relative: 0 % (ref 0–1)
EOS ABS: 0.1 10*3/uL (ref 0.0–0.7)
EOS PCT: 2 % (ref 0–5)
HCT: 41.8 % (ref 36.0–46.0)
HEMOGLOBIN: 14.5 g/dL (ref 12.0–15.0)
LYMPHS ABS: 2.3 10*3/uL (ref 0.7–4.0)
Lymphocytes Relative: 31 % (ref 12–46)
MCH: 32 pg (ref 26.0–34.0)
MCHC: 34.7 g/dL (ref 30.0–36.0)
MCV: 92.3 fL (ref 78.0–100.0)
MONOS PCT: 6 % (ref 3–12)
Monocytes Absolute: 0.5 10*3/uL (ref 0.1–1.0)
Neutro Abs: 4.6 10*3/uL (ref 1.7–7.7)
Neutrophils Relative %: 61 % (ref 43–77)
Platelets: 195 10*3/uL (ref 150–400)
RBC: 4.53 MIL/uL (ref 3.87–5.11)
RDW: 13.2 % (ref 11.5–15.5)
WBC: 7.6 10*3/uL (ref 4.0–10.5)

## 2014-02-13 LAB — TYPE AND SCREEN
ABO/RH(D): A NEG
Antibody Screen: NEGATIVE

## 2014-02-13 LAB — SURGICAL PCR SCREEN
MRSA, PCR: NEGATIVE
STAPHYLOCOCCUS AUREUS: NEGATIVE

## 2014-02-13 LAB — BASIC METABOLIC PANEL
Anion gap: 14 (ref 5–15)
BUN: 12 mg/dL (ref 6–23)
CHLORIDE: 100 meq/L (ref 96–112)
CO2: 25 mEq/L (ref 19–32)
CREATININE: 0.7 mg/dL (ref 0.50–1.10)
Calcium: 9.5 mg/dL (ref 8.4–10.5)
GFR calc Af Amer: >90 mL/min (ref >90)
Glucose, Bld: 90 mg/dL (ref 70–99)
Potassium: 4 mEq/L (ref 3.7–5.3)
Sodium: 139 mEq/L (ref 137–147)

## 2014-02-13 LAB — APTT: aPTT: 29 seconds (ref 24–37)

## 2014-02-13 LAB — PROTIME-INR
INR: 1 (ref 0.00–1.49)
Prothrombin Time: 13.3 seconds (ref 11.6–15.2)

## 2014-02-13 NOTE — Progress Notes (Signed)
Primary - dr. Tanya Nonespickard - [facility] No cardiologist Has episodes of svt but does not see cardiologist in more than 5 years. Last event was in spring of this year - had to go to ER and receive adenosine but no episodes since that time. Episodes do not seem to be prompted by anything they just come on.

## 2014-02-13 NOTE — Pre-Procedure Instructions (Signed)
Cassandra MathRhonda L [name]  02/13/2014   Your procedure is scheduled on:  Tuesday, December 8th  Report to [facility] Admitting at [phone] AM.  Call this number if you have problems the morning of surgery: [phone]   Remember:   Do not eat food or drink liquids after midnight.   Take these medicines the morning of surgery with A SIP OF WATER: nexium, cardizem if needed   Do not wear jewelry, make-up or nail polish.  Do not wear lotions, powders, or perfumes. You may wear deodorant.  Do not shave 48 hours prior to surgery. Men may shave face and neck.  Do not bring valuables to the hospital.  [facility] is not responsible  for any belongings or valuables.               Contacts, dentures or bridgework may not be worn into surgery.  Leave suitcase in the car. After surgery it may be brought to your room.  For patients admitted to the hospital, discharge time is determined by your treatment team.            Please read over the following fact sheets that you were given: Pain Booklet, Coughing and Deep Breathing, Blood Transfusion Information, MRSA Information and Surgical Site Infection Prevention  [place] - Preparing for Surgery  Before surgery, you can play an important role.  Because skin is not sterile, your skin needs to be as free of germs as possible.  You can reduce the number of germs on you skin by washing with CHG (chlorahexidine gluconate) soap before surgery.  CHG is an antiseptic cleaner which kills germs and bonds with the skin to continue killing germs even after washing.  Please DO NOT use if you have an allergy to CHG or antibacterial soaps.  If your skin becomes reddened/irritated stop using the CHG and inform your nurse when you arrive at Short Stay.  Do not shave (including legs and underarms) for at least 48 hours prior to the first CHG shower.  You may shave your face.  Please follow these instructions carefully:   1.  Shower with CHG Soap the night before  surgery and the morning of Surgery.  2.  If you choose to wash your hair, wash your hair first as usual with your normal shampoo.  3.  After you shampoo, rinse your hair and body thoroughly to remove the shampoo.  4.  Use CHG as you would any other liquid soap.  You can apply CHG directly to the skin and wash gently with scrungie or a clean washcloth.  5.  Apply the CHG Soap to your body ONLY FROM THE NECK DOWN.  Do not use on open wounds or open sores.  Avoid contact with your eyes, ears, mouth and genitals (private parts).  Wash genitals (private parts) with your normal soap.  6.  Wash thoroughly, paying special attention to the area where your surgery will be performed.  7.  Thoroughly rinse your body with warm water from the neck down.  8.  DO NOT shower/wash with your normal soap after using and rinsing off the CHG Soap.  9.  Pat yourself dry with a clean towel.            10.  Wear clean pajamas.            11.  Place clean sheets on your bed the night of your first shower and do not sleep with pets.  Day of  Surgery  Do not apply any lotions/deoderants the morning of surgery.  Please wear clean clothes to the hospital/surgery center.

## 2014-02-14 LAB — URINE CULTURE

## 2014-02-14 NOTE — Progress Notes (Addendum)
Anesthesia Chart Review:  Pt is 58 year old female scheduled for L total knee arthoplasty on 02/21/2014 with Dr. Jerl Santosalldorf.   PMH: SVT (last episode 06/17/2013, cardioverted with adenosine in ER; does not see cardiologist), Wegner's disease (congenital syphilitic osteochrondritis), leukocytoclastic vasculitis.   Medications include: diltiazem, potassium, chronic prednisone  Preoperative labs reviewed.    EKG: NSR. Moderate voltage criteria for LVH, may be normal variant. Inferior infarct, age undetermined.   Discussed case with Dr. Noreene LarssonJoslin. Pt will need cardiac clearance prior to surgery. Olegario MessierKathy in Dr. Nolon Nationsalldorf's office notified.   Rica Mastngela Meric Joye, FNP-BC [facility]/Anesthesiology Phone: [phone] 02/14/2014 4:42 PM  Addendum:  Pt has cardiac clearance at low risk for surgery from Dr. Elease HashimotoNahser in Epic note dated 02/17/2014.   Rica Mastngela Cottrell Gentles, FNP-BC [facility]Anesthesiology Phone: [phone] 02/17/2014 4:46 PM

## 2014-02-15 NOTE — H&P (Signed)
TOTAL KNEE ADMISSION H&P  Patient is being admitted for left total knee arthroplasty.  Subjective:  Chief Complaint:left knee pain.  HPI: Cassandra [name], 58 y.o. female, has a history of pain and functional disability in the left knee due to arthritis and has failed non-surgical conservative treatments for greater than 12 weeks to includeNSAID's and/or analgesics.  Onset of symptoms was gradual, starting 6 years ago with gradually worsening course since that time. The patient noted no past surgery on the left knee(s).  Patient currently rates pain in the left knee(s) at 10 out of 10 with activity. Patient has pain that interferes with activities of daily living.  Patient has evidence of joint space narrowing by imaging studies. This patient has had no. There is no active infection.  Patient Active Problem List   Diagnosis Date Noted  . Leukocytoclastic vasculitis   . 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 Anxiety   . Depression   . GERD (gastroesophageal reflux disease)   . DYSPNEA 01/03/2009  . HEPATITIS C 12/07/2008  . VASCULITIS 12/07/2008  . MICROSCOPIC HEMATURIA 12/07/2008  . HEADACHE, CHRONIC 11/30/2008  . COUGH 11/30/2008   Past Medical History  Diagnosis Date  . Allergy   . Anxiety   . Depression   . GERD (gastroesophageal reflux disease)   . SVT (supraventricular tachycardia)   . Wegner's disease (congenital syphilitic osteochondritis)   . Leukocytoclastic vasculitis     Dr. Marchelle Gearingamaswamy and Dr. Corliss Skainseveshwar  . Complication of anesthesia     difficulty waking up  . Dysrhythmia     svt  . Heart murmur     never seen cardiologist for this  . Sleep apnea     cpap  . Migraines     maybe one a year    Past Surgical History  Procedure Laterality Date  . Tonsillectomy  08/2002  . Carpal tunnel release Right 2002  . Tubal ligation  1980  . Total knee arthroplasty Right   . Bunionectomy Bilateral   . Joint replacement Right     knee    No prescriptions prior to  admission   Allergies  Allergen Reactions  . Ampicillin     Rash   . Clarithromycin     unknown  . Codeine     Unknown   . Effexor [Venlafaxine]     Hives   . Flagyl [Metronidazole]     unknown  . Fluoxetine Hcl Itching    Deep Itch   . Myrbetriq [Mirabegron]     Caused back pain  . Nsaids Other (See Comments)    Hypersensitivity vasculitis  . Paxil [Paroxetine]   . Reglan [Metoclopramide]     Blister in mouth   . Sulfa Antibiotics     Stomach irritation  . Tetracycline     Severe stomach pain    History  Substance Use Topics  . Smoking status: Never Smoker   . Smokeless tobacco: Never Used  . Alcohol Use: No    Family History  Problem Relation Age of Onset  . Clotting disorder Mother 5065    Blood clot, foot amputation  . Arthritis Sister   . Heart attack Maternal Grandmother 51  . Cervical cancer Mother   . Breast cancer Sister   . Asthma Cousin      Review of Systems  Constitutional: Negative.   HENT: Negative.   Eyes: Negative.   Respiratory: Negative.   Cardiovascular: Negative.   Gastrointestinal: Negative.   Genitourinary: Negative.   Musculoskeletal: Positive for  joint pain.  Skin: Negative.   Neurological: Negative.   Endo/Heme/Allergies: Negative.   Psychiatric/Behavioral: Negative.     Objective:  Physical Exam  Constitutional: She appears well-developed.  HENT:  Head: Normocephalic.  Eyes: Pupils are equal, round, and reactive to light.  Neck: Normal range of motion.  Cardiovascular: Normal rate.   Respiratory: Effort normal.  GI: Soft.  Musculoskeletal:  Left knee exam motion 0-1 05.  Tracy effusion severe pain medial joint line.  Crepitation 1+  Neurological: She is alert.  Skin: Skin is dry.  Psychiatric: She has a normal mood and affect.    Vital signs in last 24 hours:    Labs:   Estimated body mass index is 45.01 kg/(m^2) as calculated from the following:   Height as of 12/02/13: 5\' 3"  (1.6 m).   Weight as of  12/02/13: 115.214 kg (254 lb).   Imaging Review Plain radiographs demonstrate severe degenerative joint disease of the left knee(s). The overall alignment isneutral. The bone quality appears to be good for age and reported activity level.  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 The treatment options including medical management, injection therapy arthroscopy and arthroplasty were discussed at length. The risks and benefits of total knee arthroplasty were presented and reviewed. The risks due to aseptic loosening, infection, stiffness, patella tracking problems, thromboembolic complications and other imponderables were discussed. The patient acknowledged the explanation, agreed to proceed with the plan and consent was signed. Patient is being admitted for inpatient treatment for surgery, pain control, PT, OT, prophylactic antibiotics, VTE prophylaxis, progressive ambulation and ADL's and discharge planning. The patient is planning to be discharged home with home health services

## 2014-02-17 ENCOUNTER — Ambulatory Visit (INDEPENDENT_AMBULATORY_CARE_PROVIDER_SITE_OTHER): Payer: BC Managed Care – PPO | Admitting: Cardiovascular Disease

## 2014-02-17 ENCOUNTER — Encounter (INDEPENDENT_AMBULATORY_CARE_PROVIDER_SITE_OTHER): Payer: Self-pay

## 2014-02-17 ENCOUNTER — Encounter: Payer: Self-pay | Admitting: Cardiovascular Disease

## 2014-02-17 VITALS — BP 120/74 | HR 73 | Ht 63.5 in | Wt 254.8 lb

## 2014-02-17 DIAGNOSIS — R0989 Other specified symptoms and signs involving the circulatory and respiratory systems: Secondary | ICD-10-CM

## 2014-02-17 DIAGNOSIS — I471 Supraventricular tachycardia, unspecified: Secondary | ICD-10-CM | POA: Insufficient documentation

## 2014-02-17 DIAGNOSIS — R6889 Other general symptoms and signs: Secondary | ICD-10-CM

## 2014-02-17 DIAGNOSIS — R011 Cardiac murmur, unspecified: Secondary | ICD-10-CM

## 2014-02-17 NOTE — Assessment & Plan Note (Signed)
Cassandra [name] presents today for preoperative evaluation prior to having knee replacement. She has a history of supraventricular tachycardia and in fact had a severe episode of SVT about 6 or 7 months ago. This required a trip to the emergency room. She received adenosine which converted her back to sinus rhythm.  She's not had any recurrent episodes of SVT. She does not take diltiazem on a regular basis but takes it only as needed.   We discussed the fact that she may want to start a daily diltiazem tablet if she has frequent episodes of recurrent SVT. We also discussed the fact that she could get an ablation of her SVT but this became a problem but at this point it sounds like her episodes are very rare.    Given this information she should be at low risk for upcoming knee surgery. If she should have any episodes of SVT during surgery or after surgery, she could easily received IV adenosine and I would expect that to work very quickly to convert her back to normal rhythm.

## 2014-02-17 NOTE — Patient Instructions (Signed)
Your physician recommends that you continue on your current medications as directed. Please refer to the Current Medication list given to you today.  Call or return to clinic prn if these symptoms worsen or fail to improve as anticipated.  

## 2014-02-17 NOTE — Progress Notes (Signed)
Cassandra [name] Date of Birth  1955/03/22       [facility]    [place] [facility] [address], [address]  [address], [address] [place], [place]  [address]   [place], [place]  [id number] [phone]     [phone]   Fax  [phone]     Fax [phone]  Problem List: 1. Palpitations 2. Knee pain - needs L knee replacement.   Has had right TKA .   History of Present Illness:  Cassandra LoserRhonda is a [age] with hx of SVT.  She had a prolonged episode of SVT in April , 2015 requiring a visit to the ED .  She received IV adenosine with resolution of the SVT.  She now takes PRN Diltiazem. She has not had any severe episodes since then .    Non smoker No ETOH, Fhx:   Maternal grandparents died of MI.   Current Outpatient Prescriptions on File Prior to Visit  Medication Sig Dispense Refill  . calcium citrate (CALCITRATE - DOSED IN MG ELEMENTAL CALCIUM) 950 MG tablet Take 200 mg of elemental calcium by mouth daily.    Marland Kitchen. diltiazem (CARDIZEM) 30 MG tablet Take 1 tablet (30 mg total) by mouth as needed (for palpitations). 30 tablet 1  . esomeprazole (NEXIUM) 40 MG capsule Take 1 capsule (40 mg total) by mouth daily at 12 noon. 30 capsule 2  . potassium chloride (K-DUR,KLOR-CON) 10 MEQ tablet Take 10 mEq by mouth daily.    . predniSONE (DELTASONE) 1 MG tablet Take 8 mg by mouth daily with breakfast.    . Probiotic Product (PROBIOTIC DAILY PO) Take 1 tablet by mouth daily.     . sertraline (ZOLOFT) 100 MG tablet TAKE 1 TABLET BY MOUTH EVERY NIGHT AT BEDTIME 90 tablet 3   No current facility-administered medications on file prior to visit.    Allergies  Allergen Reactions  . Ampicillin     Rash   . Clarithromycin     unknown  . Codeine     Unknown   . Effexor [Venlafaxine]     Hives   . Flagyl [Metronidazole]     unknown  . Fluoxetine Hcl Itching    Deep Itch   . Myrbetriq [Mirabegron]     Caused back pain  . Nsaids Other (See Comments)    Hypersensitivity vasculitis    . Paxil [Paroxetine]   . Reglan [Metoclopramide]     Blister in mouth   . Sulfa Antibiotics     Stomach irritation  . Tetracycline     Severe stomach pain    Past Medical History  Diagnosis Date  . Allergy   . Anxiety   . Depression   . GERD (gastroesophageal reflux disease)   . SVT (supraventricular tachycardia)   . Wegner's disease (congenital syphilitic osteochondritis)   . Leukocytoclastic vasculitis     Dr. Marchelle Gearingamaswamy and Dr. Corliss Skainseveshwar  . Complication of anesthesia     difficulty waking up  . Dysrhythmia     svt  . Heart murmur     never seen cardiologist for this  . Sleep apnea     cpap  . Migraines     maybe one a year    Past Surgical History  Procedure Laterality Date  . Tonsillectomy  08/2002  . Carpal tunnel release Right 2002  . Tubal ligation  1980  . Total knee arthroplasty Right   . Bunionectomy Bilateral   . Joint replacement Right  knee    History  Smoking status  . Never Smoker   Smokeless tobacco  . Never Used    History  Alcohol Use No    Family History  Problem Relation Age of Onset  . Clotting disorder Mother 3165    Blood clot, foot amputation  . Arthritis Sister   . Heart attack Maternal Grandmother 51  . Cervical cancer Mother   . Breast cancer Sister   . Asthma Cousin     Reviw of Systems:  Reviewed in the HPI.  All other systems are negative.  Physical Exam: Blood pressure 120/74, pulse 73, height 5' 3.5" (1.613 m), weight 254 lb 12 oz (115.554 kg). Wt Readings from Last 3 Encounters:  02/17/14 254 lb 12 oz (115.554 kg)  02/13/14 251 lb 12.8 oz (114.216 kg)  01/27/14 258 lb (117.028 kg)     General: Well developed, well nourished, in no acute distress.  Head: Normocephalic, atraumatic, sclera non-icteric, mucus membranes are moist,   Neck: Supple. Carotids are 2 + without bruits. No JVD   Lungs: Clear   Heart: RR, normal S1S2, no significant murmurs  Abdomen: Soft, non-tender, non-distended with normal  bowel sounds.obese   Msk:  Strength and tone are normal   Extremities: No clubbing or cyanosis. No edema.  Distal pedal pulses are 2+ and equal    Neuro: CN II - XII intact.  Alert and oriented X 3. , walking is difficult because of her knee pain.   Psych:  Normal   ECG: Dec. 4, 2015:  NSR at 73.  No ST or T wave changes. No pre-excitation   Assessment / Plan:

## 2014-02-20 MED ORDER — VANCOMYCIN HCL 10 G IV SOLR
1500.0000 mg | INTRAVENOUS | Status: AC
  Administered 2014-02-21: 1500 mg via INTRAVENOUS
  Filled 2014-02-20: qty 1500

## 2014-02-20 NOTE — Progress Notes (Signed)
Patient called with time change.message left on machine to arrive at 730

## 2014-02-21 ENCOUNTER — Encounter (HOSPITAL_COMMUNITY): Payer: Self-pay | Admitting: *Deleted

## 2014-02-21 ENCOUNTER — Inpatient Hospital Stay (HOSPITAL_COMMUNITY): Payer: BC Managed Care – PPO | Admitting: Anesthesiology

## 2014-02-21 ENCOUNTER — Inpatient Hospital Stay (HOSPITAL_COMMUNITY)
Admission: RE | Admit: 2014-02-21 | Discharge: 2014-02-24 | DRG: 470 | Disposition: A | Discharge status: Home Health | Payer: BC Managed Care – PPO | Source: Ambulatory Visit | Attending: Orthopaedic Surgery | Admitting: Orthopaedic Surgery

## 2014-02-21 ENCOUNTER — Encounter (HOSPITAL_COMMUNITY)
Admission: RE | Disposition: A | Discharge status: Home Health | Payer: Self-pay | Source: Ambulatory Visit | Attending: Orthopaedic Surgery

## 2014-02-21 ENCOUNTER — Inpatient Hospital Stay (HOSPITAL_COMMUNITY): Payer: BC Managed Care – PPO | Admitting: Emergency Medicine

## 2014-02-21 DIAGNOSIS — E66813 Obesity, class 3: Secondary | ICD-10-CM | POA: Diagnosis present

## 2014-02-21 DIAGNOSIS — Z825 Family history of asthma and other chronic lower respiratory diseases: Secondary | ICD-10-CM

## 2014-02-21 DIAGNOSIS — Z7982 Long term (current) use of aspirin: Secondary | ICD-10-CM

## 2014-02-21 DIAGNOSIS — Z96651 Presence of right artificial knee joint: Secondary | ICD-10-CM | POA: Diagnosis present

## 2014-02-21 DIAGNOSIS — Z6841 Body Mass Index (BMI) 40.0 and over, adult: Secondary | ICD-10-CM | POA: Diagnosis not present

## 2014-02-21 DIAGNOSIS — Z79899 Other long term (current) drug therapy: Secondary | ICD-10-CM

## 2014-02-21 DIAGNOSIS — B1920 Unspecified viral hepatitis C without hepatic coma: Secondary | ICD-10-CM | POA: Diagnosis present

## 2014-02-21 DIAGNOSIS — Z8261 Family history of arthritis: Secondary | ICD-10-CM | POA: Diagnosis not present

## 2014-02-21 DIAGNOSIS — E6609 Other obesity due to excess calories: Secondary | ICD-10-CM | POA: Diagnosis present

## 2014-02-21 DIAGNOSIS — Z7952 Long term (current) use of systemic steroids: Secondary | ICD-10-CM

## 2014-02-21 DIAGNOSIS — G4730 Sleep apnea, unspecified: Secondary | ICD-10-CM | POA: Diagnosis present

## 2014-02-21 DIAGNOSIS — M25562 Pain in left knee: Secondary | ICD-10-CM | POA: Diagnosis present

## 2014-02-21 DIAGNOSIS — Z8249 Family history of ischemic heart disease and other diseases of the circulatory system: Secondary | ICD-10-CM | POA: Diagnosis not present

## 2014-02-21 DIAGNOSIS — F329 Major depressive disorder, single episode, unspecified: Secondary | ICD-10-CM | POA: Diagnosis present

## 2014-02-21 DIAGNOSIS — F419 Anxiety disorder, unspecified: Secondary | ICD-10-CM | POA: Diagnosis present

## 2014-02-21 DIAGNOSIS — M1710 Unilateral primary osteoarthritis, unspecified knee: Secondary | ICD-10-CM | POA: Diagnosis present

## 2014-02-21 DIAGNOSIS — M1712 Unilateral primary osteoarthritis, left knee: Secondary | ICD-10-CM | POA: Diagnosis present

## 2014-02-21 DIAGNOSIS — K219 Gastro-esophageal reflux disease without esophagitis: Secondary | ICD-10-CM | POA: Diagnosis present

## 2014-02-21 HISTORY — PX: TOTAL KNEE ARTHROPLASTY: SHX125

## 2014-02-21 SURGERY — ARTHROPLASTY, KNEE, TOTAL
Anesthesia: Monitor Anesthesia Care | Site: Knee | Laterality: Left

## 2014-02-21 MED ORDER — SODIUM CHLORIDE 0.9 % IJ SOLN
INTRAMUSCULAR | Status: DC | PRN
Start: 2014-02-21 — End: 2014-02-21
  Administered 2014-02-21: 20 mL

## 2014-02-21 MED ORDER — CALCIUM CITRATE 950 (200 CA) MG PO TABS
200.0000 mg | ORAL_TABLET | Freq: Every day | ORAL | Status: DC
  Administered 2014-02-21 – 2014-02-24 (×4): 200 mg via ORAL
  Filled 2014-02-21 (×4): qty 1

## 2014-02-21 MED ORDER — HYDROCODONE-ACETAMINOPHEN 5-325 MG PO TABS
ORAL_TABLET | ORAL | Status: AC
  Filled 2014-02-21: qty 2

## 2014-02-21 MED ORDER — MIDAZOLAM HCL 2 MG/2ML IJ SOLN
INTRAMUSCULAR | Status: AC
  Filled 2014-02-21: qty 2

## 2014-02-21 MED ORDER — BUPIVACAINE HCL (PF) 0.75 % IJ SOLN
INTRAMUSCULAR | Status: DC | PRN
  Administered 2014-02-21: 1.6 mL via INTRATHECAL

## 2014-02-21 MED ORDER — FENTANYL CITRATE 0.05 MG/ML IJ SOLN
INTRAMUSCULAR | Status: AC
  Filled 2014-02-21: qty 2

## 2014-02-21 MED ORDER — PREDNISONE 5 MG PO TABS
8.0000 mg | ORAL_TABLET | Freq: Every day | ORAL | Status: DC
  Administered 2014-02-22 – 2014-02-24 (×3): 8 mg via ORAL
  Filled 2014-02-21 (×4): qty 3

## 2014-02-21 MED ORDER — SODIUM CHLORIDE 0.9 % IR SOLN
Status: DC | PRN
  Administered 2014-02-21: 1000 mL

## 2014-02-21 MED ORDER — ACETAMINOPHEN 325 MG PO TABS: 650.0000 mg | ORAL_TABLET | Freq: Four times a day (QID) | ORAL | Status: DC | PRN

## 2014-02-21 MED ORDER — LACTATED RINGERS IV SOLN
INTRAVENOUS | Status: DC | PRN
  Administered 2014-02-21: 09:00:00 via INTRAVENOUS

## 2014-02-21 MED ORDER — HYDROCODONE-ACETAMINOPHEN 5-325 MG PO TABS
1.0000 | ORAL_TABLET | ORAL | Status: DC | PRN
  Administered 2014-02-21 – 2014-02-22 (×2): 2 via ORAL
  Filled 2014-02-21: qty 2

## 2014-02-21 MED ORDER — FENTANYL CITRATE 0.05 MG/ML IJ SOLN
INTRAMUSCULAR | Status: AC
  Filled 2014-02-21: qty 5

## 2014-02-21 MED ORDER — METHOCARBAMOL 500 MG PO TABS
500.0000 mg | ORAL_TABLET | Freq: Four times a day (QID) | ORAL | Status: DC | PRN
  Administered 2014-02-21 – 2014-02-23 (×2): 500 mg via ORAL
  Filled 2014-02-21 (×3): qty 1

## 2014-02-21 MED ORDER — OXYCODONE HCL 5 MG PO TABS
ORAL_TABLET | ORAL | Status: AC
  Filled 2014-02-21: qty 1

## 2014-02-21 MED ORDER — PANTOPRAZOLE SODIUM 40 MG PO TBEC
80.0000 mg | DELAYED_RELEASE_TABLET | Freq: Every day | ORAL | Status: DC
  Administered 2014-02-22 – 2014-02-24 (×3): 80 mg via ORAL
  Filled 2014-02-21 (×3): qty 2

## 2014-02-21 MED ORDER — DILTIAZEM HCL 30 MG PO TABS
30.0000 mg | ORAL_TABLET | ORAL | Status: DC | PRN
  Filled 2014-02-21: qty 1

## 2014-02-21 MED ORDER — PROPOFOL INFUSION 10 MG/ML OPTIME
INTRAVENOUS | Status: DC | PRN
  Administered 2014-02-21: 100 ug/kg/min via INTRAVENOUS

## 2014-02-21 MED ORDER — GLYCOPYRROLATE 0.2 MG/ML IJ SOLN
INTRAMUSCULAR | Status: AC
  Filled 2014-02-21: qty 3

## 2014-02-21 MED ORDER — ROCURONIUM BROMIDE 50 MG/5ML IV SOLN
INTRAVENOUS | Status: AC
  Filled 2014-02-21: qty 1

## 2014-02-21 MED ORDER — FENTANYL CITRATE 0.05 MG/ML IJ SOLN
25.0000 ug | INTRAMUSCULAR | Status: DC | PRN
  Administered 2014-02-21 (×3): 50 ug via INTRAVENOUS

## 2014-02-21 MED ORDER — DEXTROSE 5 % IV SOLN
500.0000 mg | Freq: Four times a day (QID) | INTRAVENOUS | Status: DC | PRN
  Filled 2014-02-21: qty 5

## 2014-02-21 MED ORDER — ONDANSETRON HCL 4 MG/2ML IJ SOLN: 4.0000 mg | Freq: Four times a day (QID) | INTRAMUSCULAR | Status: DC | PRN

## 2014-02-21 MED ORDER — ALUM & MAG HYDROXIDE-SIMETH 200-200-20 MG/5ML PO SUSP: 30.0000 mL | ORAL | Status: DC | PRN

## 2014-02-21 MED ORDER — ONDANSETRON HCL 4 MG/2ML IJ SOLN
INTRAMUSCULAR | Status: AC
  Filled 2014-02-21: qty 2

## 2014-02-21 MED ORDER — BUPIVACAINE LIPOSOME 1.3 % IJ SUSP
20.0000 mL | Freq: Once | INTRAMUSCULAR | Status: AC
  Administered 2014-02-21: 20 mL
  Filled 2014-02-21: qty 20

## 2014-02-21 MED ORDER — POTASSIUM CHLORIDE CRYS ER 10 MEQ PO TBCR
10.0000 meq | EXTENDED_RELEASE_TABLET | Freq: Every day | ORAL | Status: DC
  Administered 2014-02-21 – 2014-02-24 (×4): 10 meq via ORAL
  Filled 2014-02-21 (×4): qty 1

## 2014-02-21 MED ORDER — DEXTROSE 5 % IV SOLN
500.0000 mg | INTRAVENOUS | Status: AC
  Administered 2014-02-21: 500 mg via INTRAVENOUS
  Filled 2014-02-21: qty 5

## 2014-02-21 MED ORDER — NEOSTIGMINE METHYLSULFATE 10 MG/10ML IV SOLN
INTRAVENOUS | Status: AC
  Filled 2014-02-21: qty 1

## 2014-02-21 MED ORDER — DIPHENHYDRAMINE HCL 12.5 MG/5ML PO ELIX
12.5000 mg | ORAL_SOLUTION | ORAL | Status: DC | PRN
  Administered 2014-02-22 (×3): 25 mg via ORAL
  Filled 2014-02-21 (×3): qty 10

## 2014-02-21 MED ORDER — BISACODYL 5 MG PO TBEC: 5.0000 mg | DELAYED_RELEASE_TABLET | Freq: Every day | ORAL | Status: DC | PRN

## 2014-02-21 MED ORDER — SODIUM CHLORIDE 0.9 % IJ SOLN
INTRAMUSCULAR | Status: AC
  Filled 2014-02-21: qty 10

## 2014-02-21 MED ORDER — ONDANSETRON HCL 4 MG PO TABS: 4.0000 mg | ORAL_TABLET | Freq: Four times a day (QID) | ORAL | Status: DC | PRN

## 2014-02-21 MED ORDER — ASPIRIN EC 325 MG PO TBEC
325.0000 mg | DELAYED_RELEASE_TABLET | Freq: Two times a day (BID) | ORAL | Status: DC
  Administered 2014-02-22 – 2014-02-24 (×5): 325 mg via ORAL
  Filled 2014-02-21 (×7): qty 1

## 2014-02-21 MED ORDER — SUCCINYLCHOLINE CHLORIDE 20 MG/ML IJ SOLN
INTRAMUSCULAR | Status: AC
  Filled 2014-02-21: qty 1

## 2014-02-21 MED ORDER — LIDOCAINE HCL (CARDIAC) 20 MG/ML IV SOLN
INTRAVENOUS | Status: AC
  Filled 2014-02-21: qty 10

## 2014-02-21 MED ORDER — MIDAZOLAM HCL 5 MG/5ML IJ SOLN
INTRAMUSCULAR | Status: DC | PRN
  Administered 2014-02-21: 2 mg via INTRAVENOUS

## 2014-02-21 MED ORDER — SERTRALINE HCL 100 MG PO TABS
100.0000 mg | ORAL_TABLET | Freq: Every day | ORAL | Status: DC
  Administered 2014-02-21 – 2014-02-23 (×3): 100 mg via ORAL
  Filled 2014-02-21 (×4): qty 1

## 2014-02-21 MED ORDER — TRANEXAMIC ACID 100 MG/ML IV SOLN
2000.0000 mg | Freq: Once | INTRAVENOUS | Status: AC
  Administered 2014-02-21: 2000 mg via TOPICAL
  Filled 2014-02-21: qty 20

## 2014-02-21 MED ORDER — MENTHOL 3 MG MT LOZG: 1.0000 | LOZENGE | OROMUCOSAL | Status: DC | PRN

## 2014-02-21 MED ORDER — OXYCODONE HCL 5 MG PO TABS
5.0000 mg | ORAL_TABLET | Freq: Once | ORAL | Status: AC | PRN
  Administered 2014-02-21: 5 mg via ORAL

## 2014-02-21 MED ORDER — LIDOCAINE HCL (CARDIAC) 20 MG/ML IV SOLN
INTRAVENOUS | Status: DC | PRN
  Administered 2014-02-21: 40 mg via INTRAVENOUS

## 2014-02-21 MED ORDER — LACTATED RINGERS IV SOLN
INTRAVENOUS | Status: DC
  Administered 2014-02-22: 01:00:00 via INTRAVENOUS

## 2014-02-21 MED ORDER — HYDROMORPHONE HCL 1 MG/ML IJ SOLN
0.5000 mg | INTRAMUSCULAR | Status: DC | PRN
  Administered 2014-02-21 – 2014-02-22 (×7): 1 mg via INTRAVENOUS
  Filled 2014-02-21 (×7): qty 1

## 2014-02-21 MED ORDER — SODIUM CHLORIDE 0.9 % IR SOLN
Status: DC | PRN
  Administered 2014-02-21: 3000 mL

## 2014-02-21 MED ORDER — VANCOMYCIN HCL 10 G IV SOLR
1500.0000 mg | Freq: Two times a day (BID) | INTRAVENOUS | Status: AC
  Administered 2014-02-21: 1500 mg via INTRAVENOUS
  Filled 2014-02-21 (×2): qty 1500

## 2014-02-21 MED ORDER — FENTANYL CITRATE 0.05 MG/ML IJ SOLN
INTRAMUSCULAR | Status: DC | PRN
  Administered 2014-02-21: 100 ug via INTRAVENOUS

## 2014-02-21 MED ORDER — PROPOFOL 10 MG/ML IV BOLUS
INTRAVENOUS | Status: AC
  Filled 2014-02-21: qty 20

## 2014-02-21 MED ORDER — LACTATED RINGERS IV SOLN
INTRAVENOUS | Status: DC
  Administered 2014-02-21: 08:00:00 via INTRAVENOUS

## 2014-02-21 MED ORDER — DOCUSATE SODIUM 100 MG PO CAPS
100.0000 mg | ORAL_CAPSULE | Freq: Two times a day (BID) | ORAL | Status: DC
  Administered 2014-02-21 – 2014-02-24 (×6): 100 mg via ORAL
  Filled 2014-02-21 (×6): qty 1

## 2014-02-21 MED ORDER — LIDOCAINE HCL (CARDIAC) 20 MG/ML IV SOLN
INTRAVENOUS | Status: AC
  Filled 2014-02-21: qty 5

## 2014-02-21 MED ORDER — ACETAMINOPHEN 650 MG RE SUPP: 650.0000 mg | Freq: Four times a day (QID) | RECTAL | Status: DC | PRN

## 2014-02-21 MED ORDER — OXYCODONE HCL 5 MG/5ML PO SOLN: 5.0000 mg | Freq: Once | ORAL | Status: AC | PRN

## 2014-02-21 MED ORDER — PHENOL 1.4 % MT LIQD
1.0000 | OROMUCOSAL | Status: DC | PRN
Start: 2014-02-21 — End: 2014-02-24

## 2014-02-21 SURGICAL SUPPLY — 64 items
BAG DECANTER FOR FLEXI CONT (MISCELLANEOUS) ×2 IMPLANT
BANDAGE ELASTIC 4 VELCRO ST LF (GAUZE/BANDAGES/DRESSINGS) ×2 IMPLANT
BANDAGE ESMARK 6X9 LF (GAUZE/BANDAGES/DRESSINGS) ×1 IMPLANT
BENZOIN TINCTURE PRP APPL 2/3 (GAUZE/BANDAGES/DRESSINGS) ×2 IMPLANT
BLADE SAGITTAL 25.0X1.19X90 (BLADE) IMPLANT
BLADE SAW SGTL 13.0X1.19X90.0M (BLADE) IMPLANT
BLADE SURG ROTATE 9660 (MISCELLANEOUS) IMPLANT
BNDG ELASTIC 6X10 VLCR STRL LF (GAUZE/BANDAGES/DRESSINGS) ×2 IMPLANT
BNDG ESMARK 6X9 LF (GAUZE/BANDAGES/DRESSINGS) ×2
BNDG GAUZE ELAST 4 BULKY (GAUZE/BANDAGES/DRESSINGS) ×4 IMPLANT
BOWL SMART MIX CTS (DISPOSABLE) ×2 IMPLANT
CAP KNEE TOTAL 3 SIGMA ×2 IMPLANT
CEMENT HV SMART SET (Cement) ×4 IMPLANT
COVER SURGICAL LIGHT HANDLE (MISCELLANEOUS) ×2 IMPLANT
CUFF TOURNIQUET SINGLE 34IN LL (TOURNIQUET CUFF) ×2 IMPLANT
CUFF TOURNIQUET SINGLE 44IN (TOURNIQUET CUFF) IMPLANT
DRAPE EXTREMITY T 121X128X90 (DRAPE) ×2 IMPLANT
DRAPE IMP U-DRAPE 54X76 (DRAPES) ×2 IMPLANT
DRAPE PROXIMA HALF (DRAPES) ×2 IMPLANT
DRAPE U-SHAPE 47X51 STRL (DRAPES) ×2 IMPLANT
DRSG ADAPTIC 3X8 NADH LF (GAUZE/BANDAGES/DRESSINGS) ×2 IMPLANT
DRSG PAD ABDOMINAL 8X10 ST (GAUZE/BANDAGES/DRESSINGS) ×2 IMPLANT
DURAPREP 26ML APPLICATOR (WOUND CARE) ×2 IMPLANT
ELECT REM PT RETURN 9FT ADLT (ELECTROSURGICAL) ×2
ELECTRODE REM PT RTRN 9FT ADLT (ELECTROSURGICAL) ×1 IMPLANT
GAUZE SPONGE 4X4 12PLY STRL (GAUZE/BANDAGES/DRESSINGS) ×2 IMPLANT
GLOVE BIO SURGEON STRL SZ8 (GLOVE) ×4 IMPLANT
GLOVE BIOGEL PI IND STRL 8 (GLOVE) ×2 IMPLANT
GLOVE BIOGEL PI INDICATOR 8 (GLOVE) ×2
GOWN STRL REUS W/ TWL LRG LVL3 (GOWN DISPOSABLE) ×1 IMPLANT
GOWN STRL REUS W/ TWL XL LVL3 (GOWN DISPOSABLE) ×2 IMPLANT
GOWN STRL REUS W/TWL LRG LVL3 (GOWN DISPOSABLE) ×1
GOWN STRL REUS W/TWL XL LVL3 (GOWN DISPOSABLE) ×2
HANDPIECE INTERPULSE COAX TIP (DISPOSABLE) ×1
HOOD PEEL AWAY FACE SHEILD DIS (HOOD) ×6 IMPLANT
IMMOBILIZER KNEE 20 (SOFTGOODS) IMPLANT
IMMOBILIZER KNEE 22 UNIV (SOFTGOODS) ×2 IMPLANT
IMMOBILIZER KNEE 24 THIGH 36 (MISCELLANEOUS) IMPLANT
IMMOBILIZER KNEE 24 UNIV (MISCELLANEOUS)
KIT BASIN OR (CUSTOM PROCEDURE TRAY) ×2 IMPLANT
KIT ROOM TURNOVER OR (KITS) ×2 IMPLANT
MANIFOLD NEPTUNE II (INSTRUMENTS) ×2 IMPLANT
NEEDLE 22X1 1/2 (OR ONLY) (NEEDLE) ×2 IMPLANT
NEEDLE HYPO 21X1 ECLIPSE (NEEDLE) ×2 IMPLANT
NS IRRIG 1000ML POUR BTL (IV SOLUTION) ×2 IMPLANT
PACK TOTAL JOINT (CUSTOM PROCEDURE TRAY) ×2 IMPLANT
PACK UNIVERSAL I (CUSTOM PROCEDURE TRAY) ×2 IMPLANT
PAD ARMBOARD 7.5X6 YLW CONV (MISCELLANEOUS) ×2 IMPLANT
SET HNDPC FAN SPRY TIP SCT (DISPOSABLE) ×1 IMPLANT
STAPLER VISISTAT 35W (STAPLE) IMPLANT
STRIP CLOSURE SKIN 1/2X4 (GAUZE/BANDAGES/DRESSINGS) ×2 IMPLANT
SUCTION FRAZIER TIP 10 FR DISP (SUCTIONS) ×2 IMPLANT
SUT MNCRL AB 3-0 PS2 18 (SUTURE) ×2 IMPLANT
SUT VIC AB 0 CT1 27 (SUTURE) ×2
SUT VIC AB 0 CT1 27XBRD ANBCTR (SUTURE) ×2 IMPLANT
SUT VIC AB 2-0 CT1 27 (SUTURE) ×2
SUT VIC AB 2-0 CT1 TAPERPNT 27 (SUTURE) ×2 IMPLANT
SUT VLOC 180 0 24IN GS25 (SUTURE) ×2 IMPLANT
SYR 50ML LL SCALE MARK (SYRINGE) ×2 IMPLANT
TOWEL OR 17X24 6PK STRL BLUE (TOWEL DISPOSABLE) ×2 IMPLANT
TOWEL OR 17X26 10 PK STRL BLUE (TOWEL DISPOSABLE) ×2 IMPLANT
TRAY FOLEY CATH 14FR (SET/KITS/TRAYS/PACK) IMPLANT
UPCHARGE REV TRAY MBT KNEE ×2 IMPLANT
WATER STERILE IRR 1000ML POUR (IV SOLUTION) IMPLANT

## 2014-02-21 NOTE — Anesthesia Preprocedure Evaluation (Addendum)
Anesthesia Evaluation  Patient identified by MRN, date of birth, ID band Patient awake    Reviewed: Allergy & Precautions, H&P , NPO status , Patient's Chart, lab work & pertinent test results  Airway Mallampati: II   Neck ROM: full    Dental   Pulmonary shortness of breath, sleep apnea ,          Cardiovascular hypertension, Pt. on medications + dysrhythmias Supra Ventricular Tachycardia     Neuro/Psych  Headaches, Anxiety Depression    GI/Hepatic GERD-  ,(+) Hepatitis -, C  Endo/Other  Morbid obesity  Renal/GU      Musculoskeletal  (+) Arthritis -,   Abdominal   Peds  Hematology   Anesthesia Other Findings   Reproductive/Obstetrics                           Anesthesia Physical Anesthesia Plan  ASA: III  Anesthesia Plan: MAC and Spinal   Post-op Pain Management:    Induction: Intravenous  Airway Management Planned: Simple Face Mask  Additional Equipment:   Intra-op Plan:   Post-operative Plan:   Informed Consent: I have reviewed the patients History and Physical, chart, labs and discussed the procedure including the risks, benefits and alternatives for the proposed anesthesia with the patient or authorized representative who has indicated his/her understanding and acceptance.     Plan Discussed with: CRNA, Anesthesiologist and Surgeon  Anesthesia Plan Comments:        Anesthesia Quick Evaluation

## 2014-02-21 NOTE — Anesthesia Procedure Notes (Signed)
Spinal Patient location during procedure: OR Start time: 02/21/2014 10:01 AM End time: 02/21/2014 10:19 AM Staffing Anesthesiologist: HODIERNE, ADAM Performed by: anesthesiologist  Preanesthetic Checklist Completed: patient identified, site marked, surgical consent, pre-op evaluation, timeout performed, IV checked, risks and benefits discussed and monitors and equipment checked Spinal Block Patient position: sitting Prep: Betadine Patient monitoring: heart rate, cardiac monitor, continuous pulse ox and blood pressure Approach: midline Location: L3-4 Injection technique: single-shot Needle Needle type: Pencan  Needle gauge: 24 G Needle length: 10 cm Assessment Sensory level: T6 Additional Notes Pt tolerated the procedure well.

## 2014-02-21 NOTE — Interval H&P Note (Signed)
Reviewed and ready for surgery 

## 2014-02-21 NOTE — Evaluation (Signed)
Physical Therapy Evaluation Patient Details Name: Cassandra MathRhonda L Ramnath MRN: [id number] DOB: January 28, 1956 Today's Date: 02/21/2014   History of Present Illness  58 y.o. female [facility] for elective left TKA.  Pt with significant PMHx of Anxiety/depression, SVT, migraines, Wegner's disease, R TKA (in 2004), and R carpal tunnel release.  Clinical Impression  Pt is POD #0 and is moving well, although lethargic from pain meds given in PACU.  Pt is min assist for transfers and too lethargic to attempt even in room gait this evening.  I anticipate she will progress well enough to go home with HHPT f/u at discharge and family's 24/7 assist.   PT to follow acutely for deficits listed below.       Follow Up Recommendations Home health PT;Supervision for mobility/OOB    Equipment Recommendations  Rolling walker with 5" wheels    Recommendations for Other Services   NA    Precautions / Restrictions Precautions Precautions: Knee Precaution Comments: Reviewed WBAT status and KI use Restrictions Weight Bearing Restrictions: Yes LLE Weight Bearing: Weight bearing as tolerated      Mobility  Bed Mobility Overal bed mobility: Needs Assistance Bed Mobility: Supine to Sit     Supine to sit: Min assist     General bed mobility comments: Min assist to help progress left leg over EOB.   Transfers Overall transfer level: Needs assistance Equipment used: Rolling walker (2 wheeled) Transfers: Sit to/from [employer] CorporationStand;Stand Pivot Transfers Sit to Stand: Min assist Stand pivot transfers: Min assist       General transfer comment: Min assist to support trunk during transitions, stabilize RW, and pt able to transfer x2 ([facility] and once to recliner chair) with RW and min assist to support trunk for balance and help maneuver RW.          Balance Overall balance assessment: Needs assistance Sitting-balance support: Feet supported;No upper extremity supported Sitting balance-Leahy  Scale: Good     Standing balance support: Bilateral upper extremity supported Standing balance-Leahy Scale: Fair Standing balance comment: able to do own peri care in standing.                              Pertinent Vitals/Pain Pain Assessment: 0-10 Pain Score: 10-Worst pain ever Pain Location: left knee Pain Descriptors / Indicators: Aching;Burning Pain Intervention(s): Limited activity within patient's tolerance;Monitored during session;Repositioned (RN made aware, but has had all meds allowed)    Home Living Family/patient expects to be discharged to:: Private residence Living Arrangements: Other relatives ("my adopted family") Available Help at Discharge: Family;Available 24 hours/day Type of Home: House Home Access: Stairs to enter Entrance Stairs-Rails: Doctor, general practiceight;Left Entrance Stairs-Number of Steps: 7 Home Layout: One level Home Equipment: None      Prior Function Level of Independence: Independent                  Extremity/Trunk Assessment   Upper Extremity Assessment: Defer to OT evaluation           Lower Extremity Assessment: LLE deficits/detail   LLE Deficits / Details: left leg with normal post op pain and weakness.  Ankle at least 3/5, knee 2/5, hip 2+/5  Cervical / Trunk Assessment: Normal  Communication   Communication: HOH (bil hearing aids)  Cognition Arousal/Alertness: Lethargic;Suspect due to medications Behavior During Therapy: Flat affect Overall Cognitive Status: Difficult to assess  Assessment/Plan    PT Assessment Patient needs continued PT services  PT Diagnosis Difficulty walking;Abnormality of gait;Generalized weakness;Acute pain   PT Problem List Decreased strength;Decreased range of motion;Decreased activity tolerance;Decreased balance;Decreased mobility;Decreased knowledge of use of DME;Decreased knowledge of precautions;Pain;Obesity  PT Treatment Interventions DME  instruction;Gait training;Stair training;Functional mobility training;Therapeutic activities;Therapeutic exercise;Balance training;Neuromuscular re-education;Patient/family education;Manual techniques;Modalities   PT Goals (Current goals can be found in the Care Plan section) Acute Rehab PT Goals Patient Stated Goal: to go home at discharge PT Goal Formulation: With patient Time For Goal Achievement: 02/28/14 Potential to Achieve Goals: Good    Frequency 7X/week    End of Session Equipment Utilized During Treatment: Left knee immobilizer Activity Tolerance: Patient limited by lethargy;Patient limited by pain Patient left: in chair;with call bell/phone within reach           Time: 1705-1725 PT Time Calculation (min) (ACUTE ONLY): 20 min   Charges:   PT Evaluation $Initial PT Evaluation Tier I: 1 Procedure PT Treatments $Therapeutic Activity: 8-22 mins        Mischa Brittingham B. Denis Carreon, PT, DPT [phone]   02/21/2014, 5:34 PM

## 2014-02-21 NOTE — Plan of Care (Signed)
Problem: Consults Goal: Total Joint Replacement Patient Education See Patient Education Module for education specifics.  Outcome: Completed/Met Date Met:  02/21/14 Goal: Diagnosis- Total Joint Replacement Hemiarthroplasty Goal: Skin Care Protocol Initiated - if Braden Score 18 or less If consults are not indicated, leave blank or document N/A  Outcome: Completed/Met Date Met:  02/21/14 Goal: Diabetes Guidelines if Diabetic/Glucose > 140 If diabetic or lab glucose is > 140 mg/dl - Initiate Diabetes/Hyperglycemia Guidelines & Document Interventions  Outcome: Not Applicable Date Met:  01/14/12  Problem: Phase I Progression Outcomes Goal: CMS/Neurovascular status WDL Outcome: Completed/Met Date Met:  02/21/14 Goal: Hemodynamically stable Outcome: Completed/Met Date Met:  02/21/14

## 2014-02-21 NOTE — Anesthesia Postprocedure Evaluation (Signed)
  Anesthesia Post-op Note  Patient: Cassandra [name]  Procedure(s) Performed: Procedure(s): LEFT TOTAL KNEE ARTHROPLASTY (Left)  Patient Location: PACU  Anesthesia Type:Spinal  Level of Consciousness: awake, alert  and oriented  Airway and Oxygen Therapy: Patient Spontanous Breathing  Post-op Pain: none  Post-op Assessment: Post-op Vital signs reviewed, Patient's Cardiovascular Status Stable and Respiratory Function Stable  Post-op Vital Signs: Reviewed and stable  Last Vitals:  Filed Vitals:   02/21/14 1315  BP:   Pulse: 68  Temp:   Resp: 16    Complications: No apparent anesthesia complications

## 2014-02-21 NOTE — Progress Notes (Signed)
Utilization Review Completed.Kinney Sackmann T12/10/2013  

## 2014-02-21 NOTE — Transfer of Care (Signed)
Immediate Anesthesia Transfer of Care Note  Patient: Nat MathRhonda L Pabst  Procedure(s) Performed: Procedure(s): LEFT TOTAL KNEE ARTHROPLASTY (Left)  Patient Location: PACU  Anesthesia Type:Spinal  Level of Consciousness: awake, alert  and oriented  Airway & Oxygen Therapy: Patient Spontanous Breathing  Post-op Assessment: Report given to PACU RN and Post -op Vital signs reviewed and stable  Post vital signs: Reviewed and stable  Complications: No apparent anesthesia complications

## 2014-02-21 NOTE — Op Note (Signed)
PREOP DIAGNOSIS: DJD LEFT KNEE POSTOP DIAGNOSIS:  same PROCEDURE: LEFT TKR ANESTHESIA: Spinal and block ATTENDING SURGEON: Eiden Bagot G ASSISTANT: Elodia FlorenceAndrew Nida PA  INDICATIONS FOR PROCEDURE: Cassandra [name] is a 58 y.o. female who has struggled for a long time with pain due to degenerative arthritis of the left knee.  The patient has failed many conservative non-operative measures and at this point has pain which limits the ability to sleep and walk.  The patient is offered total knee replacement.  Informed operative consent was obtained after discussion of possible risks of anesthesia, infection, neurovascular injury, DVT, and death.  The importance of the post-operative rehabilitation protocol to optimize result was stressed extensively with the patient.  SUMMARY OF FINDINGS AND PROCEDURE:  Cassandra [name] was taken to the operative suite where under the above anesthesia a left knee replacement was performed.  There were advanced degenerative changes and the bone quality was good.  We used the DePuyLCS system and placed size standard plus femur, 3MBT tibia, 35 mm all polyethylene patella, and a size 10 mm spacer.  Elodia FlorenceAndrew Nida PA-C assisted throughout and was invaluable to the completion of the case in that he helped retract and maintain exposure while I placed the components.  He also helped close thereby minimizing OR time.  The patient was admitted for appropriate post-op care to include perioperative antibiotics and mechanical and pharmacologic measures for DVT prophylaxis.  DESCRIPTION OF PROCEDURE:  Cassandra [name] was taken to the operative suite where the above anesthesia was applied.  The patient was positioned supine and prepped and draped in normal sterile fashion.  An appropriate time out was performed.  After the administration of vancomycin pre-op antibiotic the leg was elevated and exsanguinated and a tourniquet inflated.  A standard longitudinal incision was made on the anterior knee.   Dissection was carried down to the extensor mechanism.  All appropriate anti-infective measures were used including the pre-operative antibiotic, betadine impregnated drape, and closed hooded exhaust systems for each member of the surgical team.  A medial parapatellar incision was made in the extensor mechanism and the knee cap flipped and the knee flexed.  Some residual meniscal tissues were removed along with any remaining ACL/PCL tissue.  A guide was placed on the tibia and a flat cut was made on it's superior surface.  An intramedullary guide was placed in the femur and was utilized to make anterior and posterior cuts creating an appropriate flexion gap.  A second intramedullary guide was placed in the femur to make a distal cut properly balancing the knee with an extension gap equal to the flexion gap.  The three bones sized to the above mentioned sizes and the appropriate guides were placed and utilized.  A trial reduction was done and the knee easily came to full extension and the patella tracked well on flexion.  The trial components were removed and all bones were cleaned with pulsatile lavage and then dried thoroughly.  Cement was mixed and was pressurized onto the bones followed by placement of the aforementioned components.  Excess cement was trimmed and pressure was held on the components until the cement had hardened.  The tourniquet was deflated and a small amount of bleeding was controlled with cautery and pressure.  The knee was irrigated thoroughly.  The extensor mechanism was re-approximated with V-loc suture in running fashion.  The knee was flexed and the repair was solid.  The subcutaneous tissues were re-approximated with #0 and #2-0 vicryl and the skin  closed with a subcuticular stitch and steristrips.  A sterile dressing was applied.  Intraoperative fluids, EBL, and tourniquet time can be obtained from anesthesia records.  DISPOSITION:  The patient was taken to recovery room in stable  condition and admitted for appropriate post-op care to include peri-operative antibiotic and DVT prophylaxis with mechanical and pharmacologic measures.  Shjon Lizarraga G 02/21/2014, 11:39 AM

## 2014-02-22 ENCOUNTER — Encounter (HOSPITAL_COMMUNITY): Payer: Self-pay | Admitting: Orthopaedic Surgery

## 2014-02-22 LAB — CBC
HEMATOCRIT: 34.9 % — AB (ref 36.0–46.0)
Hemoglobin: 11.8 g/dL — ABNORMAL LOW (ref 12.0–15.0)
MCH: 31 pg (ref 26.0–34.0)
MCHC: 33.8 g/dL (ref 30.0–36.0)
MCV: 91.6 fL (ref 78.0–100.0)
Platelets: 226 10*3/uL (ref 150–400)
RBC: 3.81 MIL/uL — AB (ref 3.87–5.11)
RDW: 13.3 % (ref 11.5–15.5)
WBC: 11.4 10*3/uL — AB (ref 4.0–10.5)

## 2014-02-22 LAB — BASIC METABOLIC PANEL
ANION GAP: 12 (ref 5–15)
BUN: 10 mg/dL (ref 6–23)
CHLORIDE: 100 meq/L (ref 96–112)
CO2: 25 meq/L (ref 19–32)
Calcium: 8.9 mg/dL (ref 8.4–10.5)
Creatinine, Ser: 0.67 mg/dL (ref 0.50–1.10)
GFR calc non Af Amer: >90 mL/min (ref >90)
Glucose, Bld: 136 mg/dL — ABNORMAL HIGH (ref 70–99)
POTASSIUM: 3.7 meq/L (ref 3.7–5.3)
Sodium: 137 mEq/L (ref 137–147)

## 2014-02-22 MED ORDER — HYDROCODONE-ACETAMINOPHEN 7.5-325 MG PO TABS
1.0000 | ORAL_TABLET | ORAL | Status: DC | PRN
  Administered 2014-02-22 – 2014-02-24 (×7): 2 via ORAL
  Filled 2014-02-22 (×7): qty 2

## 2014-02-22 NOTE — Progress Notes (Signed)
Physical Therapy Treatment Patient Details Name: Cassandra [name] MRN: [id number] DOB: 02/01/1956 Today's Date: 02/22/2014    History of Present Illness 58 y.o. female [facility] for elective left TKA.  Pt with significant PMHx of Anxiety/depression, SVT, migraines, Wegner's disease, R TKA (in 2004), and R carpal tunnel release.    PT Comments    Pt is progressing well with mobility and has better pain this PM as PT/RN made an effort to time therapy with recent dose of pain medication.  I anticipate starting stair training tomorrow AM as well as completely reviewing exercises in HEP packet.  PT will continue to follow acutely.  If she does d/c tomorrow, I recommend two therapy sessions prior to discharge.   Follow Up Recommendations  Home health PT;Supervision for mobility/OOB     Equipment Recommendations  Rolling walker with 5" wheels    Recommendations for Other Services   NA     Precautions / Restrictions Precautions Precautions: Knee Required Braces or Orthoses: Knee Immobilizer - Left Knee Immobilizer - Left: On except when in CPM Restrictions LLE Weight Bearing: Weight bearing as tolerated    Mobility  Bed Mobility Overal bed mobility: Needs Assistance Bed Mobility: Supine to Sit;Sit to Supine     Supine to sit: Min assist Sit to supine: Min assist   General bed mobility comments: Min assist to help support left leg with transitions into and out of bed.   Transfers Overall transfer level: Needs assistance Equipment used: Rolling walker (2 wheeled) Transfers: Sit to/from Stand Sit to Stand: Min assist         General transfer comment: Min assist to help support trunk during transitions, heavy reliance on upper extremity support.   Ambulation/Gait Ambulation/Gait assistance: Supervision Ambulation Distance (Feet): 80 Feet Assistive device: Rolling walker (2 wheeled) Gait Pattern/deviations: Step-to pattern;Antalgic Gait velocity:  decreased Gait velocity interpretation: Below normal speed for age/gender General Gait Details: Moderately antalgic gait pattern, verbal cues to stay inside of RW while turning.           Balance Overall balance assessment: Needs assistance Sitting-balance support: Feet supported;No upper extremity supported Sitting balance-Leahy Scale: Good     Standing balance support: Bilateral upper extremity supported Standing balance-Leahy Scale: Fair                      Cognition Arousal/Alertness: Awake/alert Behavior During Therapy: WFL for tasks assessed/performed Overall Cognitive Status: Within Functional Limits for tasks assessed                      Exercises Total Joint Exercises Ankle Circles/Pumps: AROM;Both;20 reps;Supine Quad Sets: AROM;Left;10 reps;Supine Towel Squeeze: AROM;Both;10 reps;Supine Heel Slides: AAROM;Left;10 reps;Supine        Pertinent Vitals/Pain Pain Assessment: 0-10 Pain Score: 6  Pain Location: left knee Pain Descriptors / Indicators: Aching Pain Intervention(s): Limited activity within patient's tolerance;Monitored during session;Premedicated before session;Repositioned;Ice applied    Home Living       Type of Home: House (her "adopted mom's house"- where she is going to stay p d/c) Home Access: Stairs to enter Entrance Stairs-Rails: Right;Left;Can reach both Home Layout: One level Home Equipment: None          PT Goals (current goals can now be found in the care plan section) Acute Rehab PT Goals Patient Stated Goal: to go home at discharge Progress towards PT goals: Progressing toward goals    Frequency  7X/week    PT  Plan Current plan remains appropriate       End of Session Equipment Utilized During Treatment: Left knee immobilizer Activity Tolerance: Patient limited by pain Patient left: in bed;with call bell/phone within reach;in CPM     Time: 1425-1453 PT Time Calculation (min) (ACUTE ONLY): 28  min  Charges:  $Gait Training: 8-22 mins $Therapeutic Exercise: 8-22 mins                      Cassandra [name] B. Cassandra [name], PT, DPT [phone]   02/22/2014, 2:58 PM

## 2014-02-22 NOTE — Plan of Care (Signed)
Problem: Phase II Progression Outcomes Goal: Tolerating diet Outcome: Completed/Met Date Met:  02/22/14

## 2014-02-22 NOTE — Evaluation (Signed)
Occupational Therapy Evaluation Patient Details Name: Cassandra MathRhonda L Baumbach MRN: [id number] DOB: 12-31-1955 Today's Date: 02/22/2014    History of Present Illness 58 y.o. [facility] for elective left TKA.  Pt with significant PMHx of Anxiety/depression, SVT, migraines, Wegner's disease, R TKA (in 2004), and R carpal tunnel release.   Clinical Impression   Pt s/p above. Pt independent with ADLs, PTA. Feel pt will benefit from acute OT to increase independence with BADLs, prior to d/c. Plan to practice tub transfer and LB ADLs next session.    Follow Up Recommendations  No OT follow up;Supervision - Intermittent    Equipment Recommendations  Other (comment) (AE)    Recommendations for Other Services       Precautions / Restrictions Precautions Precautions: Knee Precaution Comments: reviewed precautions Required Braces or Orthoses: Knee Immobilizer - Left Knee Immobilizer - Left: On except when in CPM Restrictions Weight Bearing Restrictions: Yes LLE Weight Bearing: Weight bearing as tolerated      Mobility Bed Mobility Overal bed mobility: Needs Assistance Bed Mobility: Supine to Sit     Supine to sit: Supervision   General bed mobility comments: Min assist to help support left leg with transitions into and out of bed.   Transfers Overall transfer level: Needs assistance Equipment used: Rolling walker (2 wheeled) Transfers: Sit to/from Stand Sit to Stand: Min guard;Min assist         General transfer comment: Min guard for safety. Min A to position LLE for toilet transfer.         ADL Overall ADL's : Needs assistance/impaired     Grooming: Wash/dry hands;Set up;Supervision/safety;Standing               Lower Body Dressing: Sit to/from stand;Minimal assistance;With adaptive equipment   Toilet Transfer: Min guard;Ambulation;RW;Minimal assistance (3 in 1 over commode)   Toileting- Clothing Manipulation and Hygiene: Min guard;Sit  to/from stand       Functional mobility during ADLs: Min guard;Rolling walker General ADL Comments: Educated on safety tips for home (safe shoewear, use of bag on walker, rugs, sitting for most of LB ADLs). Educated on tub transfer techniques and recommended pt not step over anytime soon.  Educated on AE/cost/where to purchase. Pt practiced with reacher and sockaid.  Educated on benefit of reaching down to left foot for LB ADLs as it allows knee to bend. Educated on LB dressing technique.     Vision                     Perception     Praxis      Pertinent Vitals/Pain Pain Assessment: 0-10 Pain Score: 8  Pain Location: left knee Pain Intervention(s): Repositioned;Ice applied;Monitored during session     Hand Dominance     Extremity/Trunk Assessment Upper Extremity Assessment Upper Extremity Assessment: Overall WFL for tasks assessed   Lower Extremity Assessment Lower Extremity Assessment: Defer to PT evaluation       Communication Communication Communication: HOH   Cognition Arousal/Alertness: Awake/alert Behavior During Therapy: WFL for tasks assessed/performed Overall Cognitive Status: Within Functional Limits for tasks assessed                     General Comments          Shoulder Instructions      Home Living Family/patient expects to be discharged to:: Private residence Living Arrangements: Other relatives Available Help at Discharge: Family;Available 24 hours/day Type of Home: House (her "  adopted mom's house" where is going to stay ) Home Access: Stairs to enter Entrance Stairs-Number of Steps: 3 Entrance Stairs-Rails: Right;Left;Can reach both Home Layout: One level     Bathroom Shower/Tub: Chief Strategy OfficerTub/shower unit   Bathroom Toilet: Handicapped height     Home Equipment: Shower seat;Bedside commode (BSC she can use)          Prior Functioning/Environment Level of Independence: Independent             OT Diagnosis: Acute pain    OT Problem List: Decreased strength;Decreased range of motion;Decreased activity tolerance;Decreased knowledge of use of DME or AE;Decreased knowledge of precautions;Pain   OT Treatment/Interventions: Self-care/ADL training;DME and/or AE instruction;Therapeutic activities;Patient/family education;Balance training    OT Goals(Current goals can be found in the care plan section) Acute Rehab OT Goals Patient Stated Goal: not stated OT Goal Formulation: With patient Time For Goal Achievement: 03/01/14 Potential to Achieve Goals: Good ADL Goals Pt Will Perform Lower Body Dressing: with modified independence;sit to/from stand;with adaptive equipment Pt Will Transfer to Toilet: with modified independence;ambulating Pt Will Perform Toileting - Clothing Manipulation and hygiene: with modified independence;sit to/from stand Pt Will Perform Tub/Shower Transfer: Tub transfer;with supervision;ambulating;rolling walker;shower seat  OT Frequency: Min 2X/week   Barriers to D/C:            Co-evaluation              End of Session Equipment Utilized During Treatment: Gait belt;Rolling walker;Left knee immobilizer CPM Left Knee CPM Left Knee: Off   Activity Tolerance: Patient tolerated treatment well Patient left: in chair;with call bell/phone within reach   Time: 1640-1705 OT Time Calculation (min): 25 min Charges:  OT General Charges $OT Visit: 1 Procedure OT Evaluation $Initial OT Evaluation Tier I: 1 Procedure OT Treatments $Self Care/Home Management : 8-22 mins G-CodesEarlie Raveling:    Bridger Pizzi L OTR/L [phone] 02/22/2014, 6:32 PM

## 2014-02-22 NOTE — Progress Notes (Signed)
OT Cancellation Note  Patient Details Name: Cassandra [name] MRN: [id number] DOB: 1955/08/13   Cancelled Treatment:    Reason Eval/Treat Not Completed: Pain limiting ability to participate. Pt rating pain as 10/10 in CPM. Notified RN, applied ice and pt stopped CPM. Will follow.  Evern BioMayberry, Isidora Laham Lynn 02/22/2014, 4:12 PM

## 2014-02-22 NOTE — Progress Notes (Signed)
Subjective: 1 Day Post-Op Procedure(s) (LRB): LEFT TOTAL KNEE ARTHROPLASTY (Left)  Activity level:  wbat Diet tolerance:  ok Voiding:  ok Patient reports pain as moderate.    Objective: Vital signs in last 24 hours: Temp:  [97.5 F (36.4 C)-98.9 F (37.2 C)] 98.9 F (37.2 C) (12/09 0537) Pulse Rate:  [59-92] 92 (12/09 0537) Resp:  [12-20] 20 (12/09 0537) BP: (124-146)/(62-88) 137/79 mmHg (12/09 0537) SpO2:  [92 %-100 %] 92 % (12/09 0537) Weight:  [115.214 kg (254 lb)] 115.214 kg (254 lb) (12/08 0815)  Labs:  Recent Labs  02/22/14 0429  HGB 11.8*    Recent Labs  02/22/14 0429  WBC 11.4*  RBC 3.81*  HCT 34.9*  PLT 226    Recent Labs  02/22/14 0429  NA 137  K 3.7  CL 100  CO2 25  BUN 10  CREATININE 0.67  GLUCOSE 136*  CALCIUM 8.9   No results for input(s): LABPT, INR in the last 72 hours.  Physical Exam:  Neurologically intact ABD soft Neurovascular intact Sensation intact distally Intact pulses distally Dorsiflexion/Plantar flexion intact Incision: dressing C/D/I No cellulitis present Compartment soft  Assessment/Plan:  1 Day Post-Op Procedure(s) (LRB): LEFT TOTAL KNEE ARTHROPLASTY (Left) Advance diet Up with therapy D/C IV fluids Plan for discharge tomorrow Discharge home with home health if doing well and cleared by PT. Continue on ASA 325mg  BID x 2 weeks post op.  Follow up in office 2 weeks post op. Increased norco from 5mg  to 7.5mg     Cassandra [name], Ginger OrganNDREW PAUL 02/22/2014, 7:49 AM

## 2014-02-22 NOTE — Progress Notes (Signed)
CARE MANAGEMENT NOTE 02/22/2014  Patient:  Nat MathLASH,Aanya L   Account Number:  [id number]  Date Initiated:  02/22/2014  Documentation initiated by:  Gi Asc LLCKRIEG,Aarica Wax  Subjective/Objective Assessment:   s/p left TKA     Action/Plan:   PT/OT evals-recommended HHPT   Anticipated DC Date:  02/23/2014   Anticipated DC Plan:  HOME W HOME HEALTH SERVICES      DC Planning Services  CM consult      [facility]  [facility] HEALTH   Choice offered to / List presented to:  C-1 Patient   DME arranged  [facility]  [facility]      DME agency  [employer]     HH arranged  HH-2 PT      [facility]   Status of service:  Completed, signed off Medicare Important Message given?   (If response is "NO", the following Medicare IM given date fields will be blank) Date Medicare IM given:   Medicare IM given by:   Date Additional Medicare IM given:   Additional Medicare IM given by:    Discharge Disposition:  HOME W HOME HEALTH SERVICES

## 2014-02-22 NOTE — Progress Notes (Signed)
Physical Therapy Treatment Patient Details Name: Cassandra MathRhonda L Cavallaro MRN: [id number] DOB: 17-Sep-1955 Today's Date: 02/22/2014    History of Present Illness 58 y.o. [facility] for elective left TKA.  Pt with significant PMHx of Anxiety/depression, SVT, migraines, Wegner's disease, R TKA (in 2004), and R carpal tunnel release.    PT Comments    Pt is POD #1 and is still having significant post-op pain.  She continues to progress her mobility, despite the pain, though and was able to walk further in the room this morning.  This afternoon we will walk in the hallway and I am planning on coordinating pain medication with the RN to see if this will help her therapy tolerance.  PT will continue to follow acutely.   Follow Up Recommendations  Home health PT;Supervision for mobility/OOB     Equipment Recommendations  Rolling walker with 5" wheels    Recommendations for Other Services   NA     Precautions / Restrictions Precautions Precautions: Knee Required Braces or Orthoses: Knee Immobilizer - Left Knee Immobilizer - Left: On except when in CPM Restrictions Weight Bearing Restrictions: Yes LLE Weight Bearing: Weight bearing as tolerated    Mobility  Bed Mobility Overal bed mobility: Needs Assistance Bed Mobility: Sit to Supine       Sit to supine: Min assist   General bed mobility comments: Min assist to support left leg during transition back into bed.   Transfers Overall transfer level: Needs assistance Equipment used: Rolling walker (2 wheeled) Transfers: Sit to/from Stand Sit to Stand: Min assist         General transfer comment: Min assist to support trunk during transition from recliner and 3-in-1 in bathroom.  Pt with heavy reliance on hands for support during transitions. Verbal cues for safety and safe hand placement.   Ambulation/Gait Ambulation/Gait assistance: Min guard Ambulation Distance (Feet): 25 Feet Assistive device: Rolling walker  (2 wheeled) Gait Pattern/deviations: Step-to pattern;Antalgic;Trunk flexed Gait velocity: decreased Gait velocity interpretation: Below normal speed for age/gender General Gait Details: verbal cues for upright posture, correct LE sequencing and safe use of RW during gait.  Min guard assist for safety during gait.           Balance Overall balance assessment: Needs assistance Sitting-balance support: Feet supported;No upper extremity supported Sitting balance-Leahy Scale: Good     Standing balance support: Bilateral upper extremity supported;Single extremity supported;No upper extremity supported Standing balance-Leahy Scale: Fair                      Cognition Arousal/Alertness: Lethargic Behavior During Therapy: Flat affect Overall Cognitive Status: Within Functional Limits for tasks assessed                      Exercises Total Joint Exercises Ankle Circles/Pumps: AROM;Both;20 reps;Supine Quad Sets: AROM;Left;10 reps;Supine Heel Slides: AAROM;Left;10 reps;Supine        Pertinent Vitals/Pain Pain Assessment: 0-10 Pain Score: 10-Worst pain ever Pain Location: left knee Pain Descriptors / Indicators: Aching;Burning Pain Intervention(s): Limited activity within patient's tolerance;Monitored during session;Repositioned;Patient requesting pain meds-RN notified;RN gave pain meds during session;Ice applied    Home Living       Type of Home: House (her "adopted mom's house"- where she is going to stay p d/c) Home Access: Stairs to enter Entrance Stairs-Rails: Right;Left;Can reach both Home Layout: One level Home Equipment: None          PT Goals (current goals  can now be found in the care plan section) Acute Rehab PT Goals Patient Stated Goal: to go home at discharge Progress towards PT goals: Progressing toward goals    Frequency  7X/week    PT Plan Current plan remains appropriate       End of Session Equipment Utilized During Treatment:  Left knee immobilizer Activity Tolerance: Patient limited by lethargy;Patient limited by pain Patient left: in bed;with call bell/phone within reach;with family/visitor present     Time: 1610-96041052-1128 PT Time Calculation (min) (ACUTE ONLY): 36 min  Charges:  $Gait Training: 8-22 mins $Therapeutic Exercise: 8-22 mins                      Teriann Livingood B. Argenis Kumari, PT, DPT [phone]   02/22/2014, 11:35 AM

## 2014-02-22 NOTE — Plan of Care (Signed)
Problem: Phase I Progression Outcomes Goal: Pain controlled with appropriate interventions Outcome: Progressing Pt still receiving PRN Dilaudid @ this time Goal: Dangle or out of bed evening of surgery Outcome: Completed/Met Date Met:  02/22/14 Goal: Initial discharge plan identified Outcome: Completed/Met Date Met:  02/22/14 Goal: Other Phase I Outcomes/Goals Outcome: Not Applicable Date Met:  02/22/14  Problem: Phase II Progression Outcomes Goal: Ambulates Outcome: Completed/Met Date Met:  02/22/14 Goal: Discharge plan established Outcome: Completed/Met Date Met:  02/22/14 Goal: Other Phase II Outcomes/Goals Outcome: Not Applicable Date Met:  02/22/14  Problem: Phase III Progression Outcomes Goal: Ambulates Outcome: Adequate for Discharge     

## 2014-02-23 LAB — CBC
HCT: 32.5 % — ABNORMAL LOW (ref 36.0–46.0)
HEMOGLOBIN: 10.8 g/dL — AB (ref 12.0–15.0)
MCH: 30.5 pg (ref 26.0–34.0)
MCHC: 33.2 g/dL (ref 30.0–36.0)
MCV: 91.8 fL (ref 78.0–100.0)
Platelets: 193 10*3/uL (ref 150–400)
RBC: 3.54 MIL/uL — ABNORMAL LOW (ref 3.87–5.11)
RDW: 13.3 % (ref 11.5–15.5)
WBC: 9.4 10*3/uL (ref 4.0–10.5)

## 2014-02-23 NOTE — Progress Notes (Signed)
Subjective: 2 Days Post-Op Procedure(s) (LRB): LEFT TOTAL KNEE ARTHROPLASTY (Left)  Activity level:  wbat Diet tolerance:  Eating well Voiding:  ok Patient reports pain as mild.    Objective: Vital signs in last 24 hours: Temp:  [97.5 F (36.4 C)-100 F (37.8 C)] 97.5 F (36.4 C) (12/10 0624) Pulse Rate:  [75-90] 75 (12/10 0624) Resp:  [12-18] 16 (12/10 0624) BP: (129-139)/(66-87) 129/66 mmHg (12/10 0624) SpO2:  [90 %-95 %] 95 % (12/10 0624)  Labs:  Recent Labs  02/22/14 0429 02/23/14 0455  HGB 11.8* 10.8*    Recent Labs  02/22/14 0429 02/23/14 0455  WBC 11.4* 9.4  RBC 3.81* 3.54*  HCT 34.9* 32.5*  PLT 226 193    Recent Labs  02/22/14 0429  NA 137  K 3.7  CL 100  CO2 25  BUN 10  CREATININE 0.67  GLUCOSE 136*  CALCIUM 8.9   No results for input(s): LABPT, INR in the last 72 hours.  Physical Exam:  Neurologically intact ABD soft Neurovascular intact Sensation intact distally Intact pulses distally Dorsiflexion/Plantar flexion intact Incision: scant drainage No cellulitis present Compartment soft  Assessment/Plan:  2 Days Post-Op Procedure(s) (LRB): LEFT TOTAL KNEE ARTHROPLASTY (Left) Advance diet Up with therapy Plan for discharge tomorrow Discharge home with home health if continuing to do well. Follow up in office 2 weeks post op. Continue on ASA 325mg  BID x 2 weeks Dressing changed to aquacel today.    Berthe Oley, Ginger OrganNDREW PAUL 02/23/2014, 10:52 AM

## 2014-02-23 NOTE — Progress Notes (Signed)
Occupational Therapy Treatment Patient Details Name: Cassandra [name] MRN: [id number] DOB: Jul 26, 1955 Today's Date: 02/23/2014    History of present illness 58 y.o. [facility] for elective left TKA.  Pt with significant PMHx of Anxiety/depression, SVT, migraines, Wegner's disease, R TKA (in 2004), and R carpal tunnel release.   OT comments  Completed ADL training with pt, including tub bench training. Pt will need tub bench for home. No further OT indicated. OT signing off.   Follow Up Recommendations  No OT follow up;Supervision - Intermittent    Equipment Recommendations  Tub/shower bench    Recommendations for Other Services      Precautions / Restrictions Precautions Precautions: Knee Precaution Booklet Issued: Yes (comment) Precaution Comments: reviewed exercise handout and no pillow under surgical knee rule.  Required Braces or Orthoses: Knee Immobilizer - Left Knee Immobilizer - Left: On except when in CPM Restrictions LLE Weight Bearing: Weight bearing as tolerated       Mobility   Transfers Overall transfer level: Needs assistance Equipment used: Rolling walker (2 wheeled) Transfers: Sit to/from [employer]Stand;Stand Pivot Transfers Sit to Stand: Supervision Stand pivot transfers: Supervision       .     Balance Overall balance assessment: Needs assistance Sitting-balance support: Feet supported;No upper extremity supported Sitting balance-Leahy Scale: Good     Standing balance support: Bilateral upper extremity supported;Single extremity supported;No upper extremity supported Standing balance-Leahy Scale: Fair                     ADL Overall ADL's : Needs assistance/impaired                                 Tub/ Shower Transfer: Supervision/safety;Cueing for sequencing;Cueing for safety;Tub bench;Rolling walker Tub/Shower Transfer Details (indicate cue type and reason): educated pt on technique for use of tub bench.  Pt able to return demonstrate. Functional mobility during ADLs: Supervision/safety;Rolling walker General ADL Comments: Reviewed us eof AE for LB ADL. Pt plans to have sister purchase AE prior to D/C. Pt wants to use tub bench      Vision                     Perception     Praxis      Cognition   Behavior During Therapy: [facility]FL for tasks assessed/performed Overall Cognitive Status: Within Functional Limits for tasks assessed                                      T               Pertinent Vitals/ Pain       Pain Assessment: 0-10 Pain Score: 3  Pain Location: L knee Pain Descriptors / Indicators: Aching Pain Intervention(s): Limited activity within patient's tolerance;Repositioned  Home Living                                          Prior Functioning/Environment              Frequency Min 2X/week     Progress Toward Goals  OT Goals(current goals can now be found in the care plan section)  Progress towards OT goals: Progressing toward goals  Acute Rehab OT Goals Patient Stated Goal: to be able to care for myself OT Goal Formulation: With patient Time For Goal Achievement: 03/01/14 Potential to Achieve Goals: Good ADL Goals Pt Will Perform Lower Body Dressing: with modified independence;sit to/from stand;with adaptive equipment Pt Will Transfer to Toilet: with modified independence;ambulating Pt Will Perform Toileting - Clothing Manipulation and hygiene: with modified independence;sit to/from stand Pt Will Perform Tub/Shower Transfer: Tub transfer;with supervision;ambulating;rolling walker;shower seat  Plan Discharge plan remains appropriate    Co-evaluation                 End of Session Equipment Utilized During Treatment: Gait belt;Rolling walker   Activity Tolerance Patient tolerated treatment well   Patient Left in chair;with call bell/phone within reach   Nurse Communication Mobility status;Other  (comment) (need for tub bench)        Time: 1204-1228 OT Time Calculation (min): 24 min  Charges: OT General Charges $OT Visit: 1 Procedure OT Treatments $Self Care/Home Management : 23-37 mins  Zyaire Mccleod,HILLARY 02/23/2014, 12:37 PM  [facility] LLC[facility] Haliey Romberg, OTR/L  [phone] 02/23/2014

## 2014-02-23 NOTE — Progress Notes (Signed)
Physical Therapy Treatment Patient Details Name: Cassandra [name] MRN: [id number] DOB: 06/11/1955 Today's Date: 02/23/2014    History of Present Illness 58 [facility].o. [facility] for [facility] left [facility]KA.  Pt with significant PMHx of Anxiety/depression, SVT, migraines, Wegner's disease, R TKA (in 2004), and R carpal tunnel release.    PT Comments    Pt is POD #2 and is moving well.  She is much more alert and her pain is significantly better today.  She was able to progress both hallway ambulation and exercises.  PA came in during our session and pt's preference is to d/c tomorrow.  PT will continue to follow acutely.   Follow Up Recommendations  Home health PT;Supervision for mobility/OOB     Equipment Recommendations  Rolling walker with 5" wheels    Recommendations for Other Services   NA     Precautions / Restrictions Precautions Precautions: Knee Precaution Booklet Issued: Yes (comment) Precaution Comments: reviewed exercise handout and no pillow under surgical knee rule.  Required Braces or Orthoses: Knee Immobilizer - Left Knee Immobilizer - Left: On except when in CPM Restrictions Weight Bearing Restrictions: Yes LLE Weight Bearing: Weight bearing as tolerated    Mobility  Bed Mobility Overal bed mobility: Modified Independent       Supine to sit: Modified independent (Device/Increase time)     General bed mobility comments: Pt with a little extra time was able to progress her left knee over the side of the bed un assisted.  Pt did use bed rail for leverage and to help manage her trunk and upper body.   Transfers Overall transfer level: Needs assistance Equipment used: Rolling walker (2 wheeled) Transfers: Sit to/from Stand Sit to Stand: Min guard         General transfer comment: Min guard assist for safety as transition to stand is especially effortful.  Pt is heavily reliant on upper extremities for transitions.    Ambulation/Gait Ambulation/Gait assistance: Supervision Ambulation Distance (Feet): 100 Feet Assistive device: Rolling walker (2 wheeled) Gait Pattern/deviations: Step-to pattern;Antalgic Gait velocity: decreased Gait velocity interpretation: Below normal speed for age/gender General Gait Details: Moderately antalgic gait pattern, however, more confident steps today as well as once pt is moving and walks further, her pattern becomes more normal.            Balance Overall balance assessment: Needs assistance Sitting-balance support: Feet supported;No upper extremity supported Sitting balance-Leahy Scale: Good     Standing balance support: Bilateral upper extremity supported;Single extremity supported;No upper extremity supported Standing balance-Leahy Scale: Fair Standing balance comment: Pt was able to preform her own peri care with supoervision and pull her adult brief up without upper extremity support.                     Cognition Arousal/Alertness: Awake/alert Behavior During Therapy: WFL for tasks assessed/performed Overall Cognitive Status: Within Functional Limits for tasks assessed                      Exercises Total Joint Exercises Ankle Circles/Pumps: AROM;Both;20 reps;Supine Quad Sets: AROM;Left;10 reps;Supine Towel Squeeze: AROM;Both;10 reps;Supine Heel Slides: AAROM;Left;10 reps;Supine Hip ABduction/ADduction: AAROM;Left;10 reps;Supine Straight Leg Raises: AROM;Left;10 reps;Supine        Pertinent Vitals/Pain Pain Assessment: 0-10 Pain Score: 6  Pain Location: left knee Pain Descriptors / Indicators: Aching;Burning Pain Intervention(s): Limited activity within patient's tolerance;Monitored during session;Repositioned           PT Goals (  current goals can now be found in the care plan section) Acute Rehab PT Goals Patient Stated Goal: not stated Progress towards PT goals: Progressing toward goals    Frequency  7X/week     PT Plan Current plan remains appropriate       End of Session Equipment Utilized During Treatment: Left knee immobilizer Activity Tolerance: Patient tolerated treatment well Patient left: in chair;with call bell/phone within reach     Time: 1022-1107 PT Time Calculation (min) (ACUTE ONLY): 45 min  Charges:  $Gait Training: 8-22 mins $Therapeutic Exercise: 23-37 mins                      Cassandra [name] B. Cassandra [name], PT, DPT [phone]   02/23/2014, 11:24 AM

## 2014-02-23 NOTE — Plan of Care (Signed)
Problem: Phase III Progression Outcomes Goal: Pain controlled on oral analgesia Outcome: Completed/Met Date Met:  02/23/14 Goal: Ambulates Outcome: Completed/Met Date Met:  02/23/14 Goal: Incision clean - minimal/no drainage Outcome: Completed/Met Date Met:  02/23/14 Goal: Discharge plan remains appropriate-arrangements made Outcome: Completed/Met Date Met:  02/23/14 Goal: Anticoagulant follow-up in place Outcome: Completed/Met Date Met:  02/23/14 Goal: Other Phase III Outcomes/Goals Outcome: Not Applicable Date Met:  77/82/42  Problem: Discharge Progression Outcomes Goal: Barriers To Progression Addressed/Resolved Outcome: Not Applicable Date Met:  35/36/14 Goal: CMS/Neurovascular status at or above baseline Outcome: Completed/Met Date Met:  02/23/14 Goal: Anticoagulant follow-up in place Outcome: Completed/Met Date Met:  02/23/14 Goal: Pain controlled with appropriate interventions Outcome: Completed/Met Date Met:  02/23/14 Goal: Hemodynamically stable Outcome: Completed/Met Date Met:  43/15/40 Goal: Complications resolved/controlled Outcome: Not Applicable Date Met:  08/67/61 Goal: Tolerates diet Outcome: Completed/Met Date Met:  02/23/14 Goal: Activity appropriate for discharge plan Outcome: Completed/Met Date Met:  02/23/14 Goal: Ambulates safely using assistive device Outcome: Completed/Met Date Met:  02/23/14 Goal: Follows weight - bearing limitations Outcome: Completed/Met Date Met:  02/23/14 Goal: Discharge plan in place and appropriate Outcome: Completed/Met Date Met:  02/23/14 Goal: Demonstrates ADLs as appropriate Outcome: Completed/Met Date Met:  02/23/14 Goal: Incision without S/S infection Outcome: Completed/Met Date Met:  02/23/14 Goal: Other Discharge Outcomes/Goals Outcome: Not Applicable Date Met:  95/09/32

## 2014-02-23 NOTE — Progress Notes (Signed)
Physical Therapy Treatment Patient Details Name: Cassandra [name] MRN: [id number] DOB: 02-21-1956 Today's Date: 02/23/2014    History of Present Illness 58 [facility]. [facility] for [facility] left [facility].  Pt with significant PMHx of Anxiety/depression, SVT, migraines, Wegner's disease, R TKA (in 2004), and R carpal tunnel release.    PT Comments    Pt is progressing very well with her mobility.  We were able to start stair training and progress her gait twice as far as this AM.  She is expecting to d/c tomorrow, so PT to see again in AM to reinforce stair training, gait, exercises, and precautions.   Follow Up Recommendations  Home health PT;Supervision for mobility/OOB     Equipment Recommendations  Rolling walker with 5" wheels    Recommendations for Other Services   NA     Precautions / Restrictions Precautions Precautions: Knee Required Braces or Orthoses: Knee Immobilizer - Left Knee Immobilizer - Left: On except when in CPM Restrictions LLE Weight Bearing: Weight bearing as tolerated    Mobility  Bed Mobility Overal bed mobility: Needs Assistance Bed Mobility: Sit to Supine       Sit to supine: Min assist   General bed mobility comments: Min assist to help lift left leg into the bed.   Transfers Overall transfer level: Needs assistance Equipment used: Rolling walker (2 wheeled) Transfers: Sit to/from Stand Sit to Stand: Supervision Stand pivot transfers: Supervision       General transfer comment: supervision for safety  Ambulation/Gait Ambulation/Gait assistance: Supervision Ambulation Distance (Feet): 260 Feet Assistive device: Rolling walker (2 wheeled) Gait Pattern/deviations: Step-to pattern;Antalgic Gait velocity: decreased Gait velocity interpretation: Below normal speed for age/gender General Gait Details: Moderately antalgic gait pattern wich improves with increased gait distance.  Verbal cues for upright posture.     Stairs Stairs: Yes Stairs assistance: Min assist Stair Management: Two rails;Step to pattern;Forwards Number of Stairs: 5 General stair comments: Verbal cues for LE sequencing and step to pattern for safety.  Pt needed min assist to steady her trunk while stepping up.       Balance Overall balance assessment: Needs assistance Sitting-balance support: Feet supported;No upper extremity supported Sitting balance-Leahy Scale: Good     Standing balance support: Bilateral upper extremity supported Standing balance-Leahy Scale: Fair                      Cognition Arousal/Alertness: Awake/alert Behavior During Therapy: WFL for tasks assessed/performed Overall Cognitive Status: Within Functional Limits for tasks assessed                      Exercises Total Joint Exercises Long Arc Quad: AROM;Left;10 reps;Seated Knee Flexion: AROM;Left;10 reps;Seated        Pertinent Vitals/Pain Pain Assessment: 0-10 Pain Score: 3  Pain Location: left knee Pain Descriptors / Indicators: Aching;Burning Pain Intervention(s): Limited activity within patient's tolerance;Monitored during session;Repositioned;Patient requesting pain meds-RN notified;RN gave pain meds during session           PT Goals (current goals can now be found in the care plan section) Acute Rehab PT Goals Patient Stated Goal: to be able to care for myself Progress towards PT goals: Progressing toward goals    Frequency  7X/week    PT Plan Current plan remains appropriate       End of Session Equipment Utilized During Treatment: Left knee immobilizer Activity Tolerance: Patient limited by fatigue;Patient limited by pain Patient left: in  bed;in CPM;with call bell/phone within reach     Time: [id number] PT Time Calculation (min) (ACUTE ONLY): 42 min  Charges:  $Gait Training: 23-37 mins $Therapeutic Exercise: 8-22 mins                      Jaziel Bennett B. Guelda Batson, PT, DPT [phone]    02/23/2014, 3:30 PM

## 2014-02-24 LAB — CBC
HEMATOCRIT: 30.6 % — AB (ref 36.0–46.0)
Hemoglobin: 10.1 g/dL — ABNORMAL LOW (ref 12.0–15.0)
MCH: 30.3 pg (ref 26.0–34.0)
MCHC: 33 g/dL (ref 30.0–36.0)
MCV: 91.9 fL (ref 78.0–100.0)
Platelets: 188 10*3/uL (ref 150–400)
RBC: 3.33 MIL/uL — AB (ref 3.87–5.11)
RDW: 13.5 % (ref 11.5–15.5)
WBC: 7.7 10*3/uL (ref 4.0–10.5)

## 2014-02-24 MED ORDER — HYDROCODONE-ACETAMINOPHEN 7.5-325 MG PO TABS: 1.0000 | ORAL_TABLET | ORAL | Status: DC | PRN

## 2014-02-24 MED ORDER — METHOCARBAMOL 500 MG PO TABS: 500.0000 mg | ORAL_TABLET | Freq: Four times a day (QID) | ORAL | Status: DC | PRN

## 2014-02-24 MED ORDER — ASPIRIN 325 MG PO TBEC: 325.0000 mg | DELAYED_RELEASE_TABLET | Freq: Two times a day (BID) | ORAL | Status: DC

## 2014-02-24 NOTE — Progress Notes (Signed)
Physical Therapy Treatment Patient Details Name: Cassandra MathRhonda L [name] MRN: [id number] DOB: 01-06-1956 Today's Date: 02/24/2014    History of Present Illness 58 [facility].o. [facility] for [facility] left TKA.  Pt with significant PMHx of Anxiety/depression, SVT, migraines, Wegner's disease, R TKA (in 2004), and R carpal tunnel release.    PT Comments    Pt was seen for visit with pt demonstrating better functional mobility after trying stairs and succeeding with railing to assist.  Planning discharge home with help and will be able to follow up with walker and HHPT.   Follow Up Recommendations  Home health PT;Supervision for mobility/OOB     Equipment Recommendations  Rolling walker with 5" wheels    Recommendations for Other Services       Precautions / Restrictions Precautions Precautions: Knee Precaution Booklet Issued: Yes (comment) Precaution Comments: reviewed exercise handout and no pillow under surgical knee rule.  Required Braces or Orthoses: Knee Immobilizer - Left Knee Immobilizer - Left: On except when in CPM Restrictions Weight Bearing Restrictions: Yes LLE Weight Bearing: Weight bearing as tolerated    Mobility  Bed Mobility Overal bed mobility: Modified Independent                Transfers Overall transfer level: Needs assistance Equipment used: Rolling walker (2 wheeled) Transfers: Sit to/from [employer]Stand;Stand Pivot Transfers Sit to Stand: Supervision Stand pivot transfers: Supervision       General transfer comment: observation to be sure pt follows instructions for knee with L knee immobilzer in place  Ambulation/Gait Ambulation/Gait assistance: Supervision Ambulation Distance (Feet): 180 Feet Assistive device: Rolling walker (2 wheeled) Gait Pattern/deviations: Step-to pattern;Step-through pattern;Decreased step length - right;Decreased step length - left;Decreased dorsiflexion - right;Decreased dorsiflexion - left;Decreased weight  shift to left;Wide base of support (sequence changed with vc's) Gait velocity: decreased Gait velocity interpretation: Below normal speed for age/gender General Gait Details: pt is requiring some cues for step length to get through on RLE and to shift wgt evenly   Stairs Stairs: Yes Stairs assistance: Supervision Stair Management: One rail Right;Step to pattern;Forwards (practiced alternate situation) Number of Stairs: 4 General stair comments: cued one rail technique with brace on LLE  Wheelchair Mobility    Modified Rankin (Stroke Patients Only)       Balance Overall balance assessment: Needs assistance Sitting-balance support: Feet supported Sitting balance-Leahy Scale: Good   Postural control: Posterior lean Standing balance support: Bilateral upper extremity supported Standing balance-Leahy Scale: Fair                      Cognition Arousal/Alertness: Awake/alert Behavior During Therapy: WFL for tasks assessed/performed Overall Cognitive Status: Within Functional Limits for tasks assessed                      Exercises Total Joint Exercises Ankle Circles/Pumps: AROM;Both;5 reps Quad Sets: AROM;Both;5 reps Gluteal Sets: AROM;Both;5 reps Heel Slides: AROM;Right;5 reps Hip ABduction/ADduction: AROM;Both;5 reps    General Comments        Pertinent Vitals/Pain Pain Assessment: Faces Pain Score: 4  Faces Pain Scale: Hurts little more Pain Location: L knee back of joint  Pain Intervention(s): Limited activity within patient's tolerance;Premedicated before session;Ice applied    Home Living                      Prior Function            PT Goals (current goals can  now be found in the care plan section) Acute Rehab PT Goals Patient Stated Goal: get home Progress towards PT goals: Progressing toward goals    Frequency  7X/week    PT Plan Current plan remains appropriate    Co-evaluation             End of Session  Equipment Utilized During Treatment: Left knee immobilizer Activity Tolerance: Patient limited by fatigue;Patient limited by pain Patient left: in chair;with call bell/phone within reach     Time: 0914-0946 PT Time Calculation (min) (ACUTE ONLY): 32 min  Charges:  $Gait Training: 8-22 mins $Therapeutic Exercise: 8-22 mins                    G Codes:      Ivar DrapeStout, Lindell Tussey E 02/24/2014, 10:36 AM   Samul Dadauth May Manrique, PT MS Acute Rehab Dept. Number: [phone]

## 2014-02-24 NOTE — Discharge Summary (Signed)
Patient ID: Cassandra [name] MRN: [id number] DOB/AGE: 1955-04-06 58 y.o.  Admit date: 02/21/2014 Discharge date: 02/24/2014  Admission Diagnoses:  Principal Problem:   Primary osteoarthritis of left knee Active Problems:   Obesity   Primary osteoarthritis of knee   Discharge Diagnoses:  Same  Past Medical History  Diagnosis Date  . Allergy   . Anxiety   . Depression   . GERD (gastroesophageal reflux disease)   . SVT (supraventricular tachycardia)   . Wegner's disease (congenital syphilitic osteochondritis)   . Leukocytoclastic vasculitis     Dr. Marchelle Gearingamaswamy and Dr. Corliss Skainseveshwar  . Complication of anesthesia     difficulty waking up  . Dysrhythmia     svt  . Heart murmur     never seen cardiologist for this  . Sleep apnea     cpap  . Migraines     maybe one a year    Surgeries: Procedure(s): LEFT TOTAL KNEE ARTHROPLASTY on 02/21/2014   Consultants:    Discharged Condition: Improved  Hospital Course: Cassandra [name] is an 58 y.o. female who was admitted 02/21/2014 for operative treatment ofPrimary osteoarthritis of left knee. Patient has severe unremitting pain that affects sleep, daily activities, and work/hobbies. After pre-op clearance the patient was taken to the operating room on 02/21/2014 and underwent  Procedure(s): LEFT TOTAL KNEE ARTHROPLASTY.    Patient was given perioperative antibiotics: Anti-infectives    Start     Dose/Rate Route Frequency Ordered Stop   02/21/14 2000  vancomycin (VANCOCIN) 1,500 mg in sodium chloride 0.9 % 500 mL IVPB     1,500 mg250 mL/hr over 120 Minutes Intravenous Every 12 hours 02/21/14 1558 02/21/14 2240   02/21/14 0600  vancomycin (VANCOCIN) 1,500 mg in sodium chloride 0.9 % 500 mL IVPB     1,500 mg250 mL/hr over 120 Minutes Intravenous On call to O.R. 02/20/14 1411 02/21/14 1048       Patient was given sequential compression devices, early ambulation, and chemoprophylaxis to prevent DVT.  Patient benefited maximally from hospital  stay and there were no complications.    Recent vital signs: Patient Vitals for the past 24 hrs:  BP Temp Pulse Resp SpO2  02/24/14 0535 133/63 mmHg 98.1 F (36.7 C) 78 16 94 %  02/24/14 0400 - - - 12 95 %  02/24/14 0000 - - - 12 95 %  02/23/14 2101 (!) 134/52 mmHg 97.8 F (36.6 C) 78 16 98 %  02/23/14 2000 - - - 12 98 %     Recent laboratory studies:  Recent Labs  02/22/14 0429 02/23/14 0455 02/24/14 0500  WBC 11.4* 9.4 7.7  HGB 11.8* 10.8* 10.1*  HCT 34.9* 32.5* 30.6*  PLT 226 193 188  NA 137  --   --   K 3.7  --   --   CL 100  --   --   CO2 25  --   --   BUN 10  --   --   CREATININE 0.67  --   --   GLUCOSE 136*  --   --   CALCIUM 8.9  --   --      Discharge Medications:     Medication List    TAKE these medications        aspirin 325 MG EC tablet  Take 1 tablet (325 mg total) by mouth 2 (two) times daily after a meal.     calcium citrate 950 MG tablet  Commonly known as:  CALCITRATE - dosed  in mg elemental calcium  Take 200 mg of elemental calcium by mouth daily.     diltiazem 30 MG tablet  Commonly known as:  CARDIZEM  Take 1 tablet (30 mg total) by mouth as needed (for palpitations).     esomeprazole 40 MG capsule  Commonly known as:  NEXIUM  Take 1 capsule (40 mg total) by mouth daily at 12 noon.     HYDROcodone-acetaminophen 7.5-325 MG per tablet  Commonly known as:  NORCO  Take 1-2 tablets by mouth every 4 (four) hours as needed for moderate pain or severe pain.     methocarbamol 500 MG tablet  Commonly known as:  ROBAXIN  Take 1 tablet (500 mg total) by mouth every 6 (six) hours as needed for muscle spasms.     potassium chloride 10 MEQ tablet  Commonly known as:  K-DUR,KLOR-CON  Take 10 mEq by mouth daily.     predniSONE 1 MG tablet  Commonly known as:  DELTASONE  Take 8 mg by mouth daily with breakfast.     PROBIOTIC DAILY PO  Take 1 tablet by mouth daily.     sertraline 100 MG tablet  Commonly known as:  ZOLOFT  TAKE 1 TABLET BY  MOUTH EVERY NIGHT AT BEDTIME        Diagnostic Studies: No results found.  Disposition: 01-Home or Self Care      Discharge Instructions    Call MD / Call 911    Complete by:  As directed   If you experience chest pain or shortness of breath, CALL 911 and be transported to the hospital emergency room.  If you develope a fever above 101 F, pus (white drainage) or increased drainage or redness at the wound, or calf pain, call your surgeon's office.     Constipation Prevention    Complete by:  As directed   Drink plenty of fluids.  Prune juice may be helpful.  You may use a stool softener, such as Colace (over the counter) 100 mg twice a day.  Use MiraLax (over the counter) for constipation as needed.     Diet - low sodium heart healthy    Complete by:  As directed      Increase activity slowly as tolerated    Complete by:  As directed            Follow-up Information    Follow up with Velna OchsALLDORF,PETER G, MD. Call in 2 weeks.   Specialty:  Orthopedic Surgery   Contact information:   [address] [place] [id number] [phone]        Signed: Drema HalonIDA, Tarrah Furuta PAUL 02/24/2014, 1:18 PM

## 2014-02-24 NOTE — Progress Notes (Signed)
Subjective: 3 Days Post-Op Procedure(s) (LRB): LEFT TOTAL KNEE ARTHROPLASTY (Left)  Activity level:  wbat Diet tolerance:  Eating well Voiding:  ok Patient reports pain as mild.    Objective: Vital signs in last 24 hours: Temp:  [97.8 F (36.6 C)-98.1 F (36.7 C)] 98.1 F (36.7 C) (12/11 1300) Pulse Rate:  [78-83] 83 (12/11 1300) Resp:  [12-16] 16 (12/11 1300) BP: (133-136)/(52-64) 136/64 mmHg (12/11 1300) SpO2:  [94 %-98 %] 98 % (12/11 1300)  Labs:  Recent Labs  02/22/14 0429 02/23/14 0455 02/24/14 0500  HGB 11.8* 10.8* 10.1*    Recent Labs  02/23/14 0455 02/24/14 0500  WBC 9.4 7.7  RBC 3.54* 3.33*  HCT 32.5* 30.6*  PLT 193 188    Recent Labs  02/22/14 0429  NA 137  K 3.7  CL 100  CO2 25  BUN 10  CREATININE 0.67  GLUCOSE 136*  CALCIUM 8.9   No results for input(s): LABPT, INR in the last 72 hours.  Physical Exam:  Neurologically intact ABD soft Neurovascular intact Sensation intact distally Intact pulses distally Dorsiflexion/Plantar flexion intact Incision: dressing C/D/I No cellulitis present Compartment soft  Assessment/Plan:  3 Days Post-Op Procedure(s) (LRB): LEFT TOTAL KNEE ARTHROPLASTY (Left) Advance diet Up with therapy Discharge home with home health today Continue on ASA 325mg  BID x 2 weeks. Follow up in office 2 weeks post op.     Genese Quebedeaux, Ginger OrganNDREW PAUL 02/24/2014, 4:07 PM

## 2014-06-02 IMAGING — CT CT CHEST HIGH RESOLUTION W/O CM
1 of 5 series · 4 of 36 positions shown, 5 images · non-contrast
Comparison: 12/04/2008.

CLINICAL DATA: Chronic cough and pleurisy. Evaluate for
interstitial lung disease.

EXAM:
CT CHEST WITHOUT CONTRAST
TECHNIQUE: Multidetector CT imaging of the chest was performed following the
standard protocol without intravenous contrast. High resolution
imaging of the lungs, as well as inspiratory and expiratory imaging,
was performed.

[Series 602: cor · coronal · 0.75mm/px · 4 of 109 slices shown, 5 images]
[im 22/109  mediastinal]
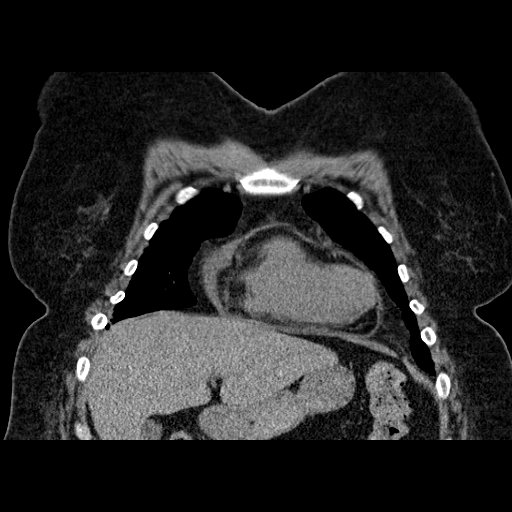
[im 22/109  lung]
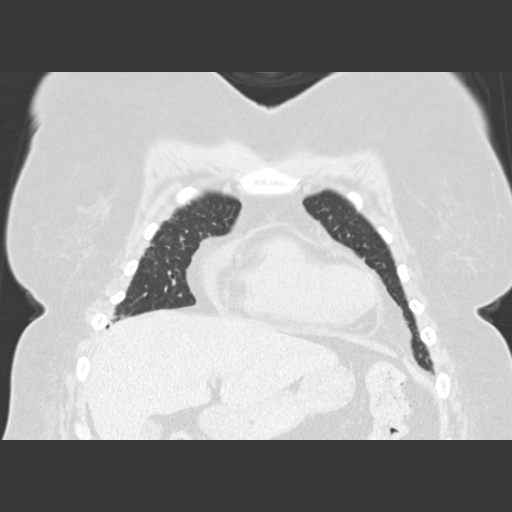
[im 44/109  lung]
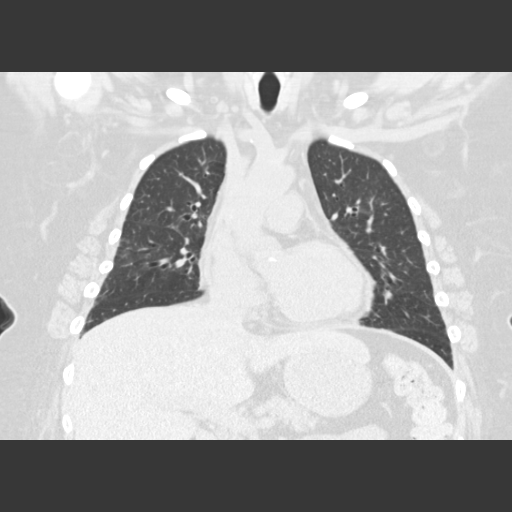
[im 65/109  lung]
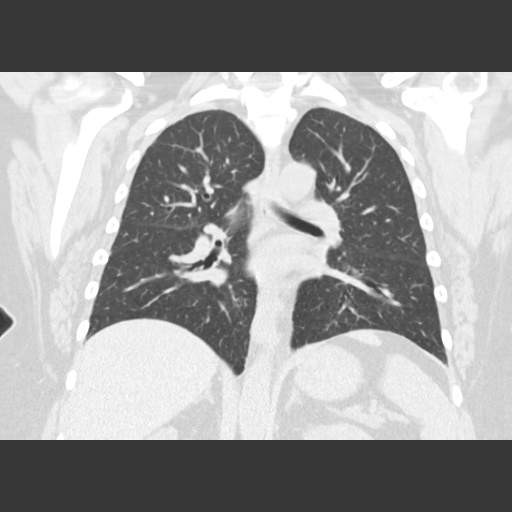
[im 87/109  lung]
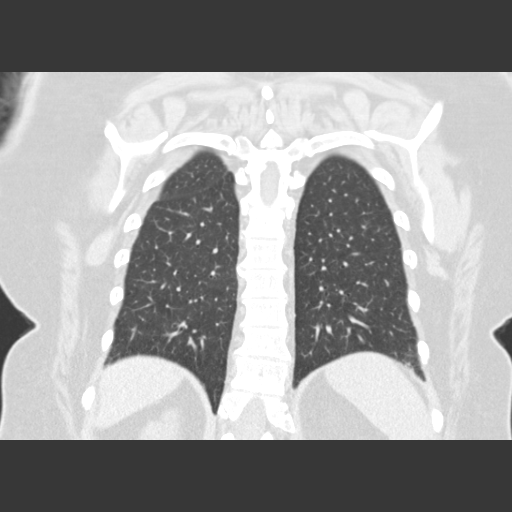

[4 of 36 positions shown; findings below may reference images not displayed]

FINDINGS: No pathologically enlarged mediastinal lymph nodes. Hilar regions
are difficult to definitively evaluate but appear grossly
unremarkable. No axillary adenopathy. Heart size within normal
limits. There may be a small amount of pericardial fluid or
thickening, new from the prior exam. Small hiatal hernia.

Image quality is somewhat degraded by respiratory motion. Very mild
subpleural reticulation is seen in the right lung, without definite
zonal predominance. Very minimal involvement of the lingula and left
lower lobe (example series 8 and image 21). Findings are unchanged
from 12/04/2008. No traction bronchiectasis/ bronchiolectasis,
ground-glass, architectural distortion or honeycombing. No air
trapping on inspiratory and expiratory imaging. No pleural fluid.
Airway is unremarkable.

Incidental imaging of the upper abdomen shows the visualized
portions of the liver, gallbladder, adrenal glands, kidneys, spleen,
pancreas, stomach and bowel to be grossly unremarkable. No upper
abdominal adenopathy. No worrisome lytic or sclerotic lesions.
Degenerative changes are seen in the spine.
IMPRESSION: 1. Minimal subpleural reticulation, right greater than left,
unchanged from 12/04/2008. Findings may be due to nonspecific
interstitial pneumonitis (NSIP).
2. Question small amount of pericardial fluid or thickening, new.

## 2015-03-01 ENCOUNTER — Encounter (HOSPITAL_COMMUNITY): Payer: Self-pay | Admitting: Nurse Practitioner

## 2015-03-01 ENCOUNTER — Emergency Department (HOSPITAL_COMMUNITY)
Admission: EM | Admit: 2015-03-01 | Discharge: 2015-03-01 | Disposition: A | Discharge status: Home / Self Care | Payer: 59 | Attending: Emergency Medicine | Admitting: Emergency Medicine

## 2015-03-01 DIAGNOSIS — Z8679 Personal history of other diseases of the circulatory system: Secondary | ICD-10-CM | POA: Diagnosis not present

## 2015-03-01 DIAGNOSIS — Z9089 Acquired absence of other organs: Secondary | ICD-10-CM | POA: Diagnosis not present

## 2015-03-01 DIAGNOSIS — Z8739 Personal history of other diseases of the musculoskeletal system and connective tissue: Secondary | ICD-10-CM | POA: Insufficient documentation

## 2015-03-01 DIAGNOSIS — Z7952 Long term (current) use of systemic steroids: Secondary | ICD-10-CM | POA: Diagnosis not present

## 2015-03-01 DIAGNOSIS — Z9981 Dependence on supplemental oxygen: Secondary | ICD-10-CM | POA: Insufficient documentation

## 2015-03-01 DIAGNOSIS — R011 Cardiac murmur, unspecified: Secondary | ICD-10-CM | POA: Insufficient documentation

## 2015-03-01 DIAGNOSIS — Z880 Allergy status to penicillin: Secondary | ICD-10-CM | POA: Insufficient documentation

## 2015-03-01 DIAGNOSIS — K219 Gastro-esophageal reflux disease without esophagitis: Secondary | ICD-10-CM | POA: Insufficient documentation

## 2015-03-01 DIAGNOSIS — F419 Anxiety disorder, unspecified: Secondary | ICD-10-CM | POA: Diagnosis not present

## 2015-03-01 DIAGNOSIS — Z79899 Other long term (current) drug therapy: Secondary | ICD-10-CM | POA: Diagnosis not present

## 2015-03-01 DIAGNOSIS — Z8619 Personal history of other infectious and parasitic diseases: Secondary | ICD-10-CM | POA: Insufficient documentation

## 2015-03-01 DIAGNOSIS — F329 Major depressive disorder, single episode, unspecified: Secondary | ICD-10-CM | POA: Diagnosis not present

## 2015-03-01 DIAGNOSIS — G4730 Sleep apnea, unspecified: Secondary | ICD-10-CM | POA: Diagnosis not present

## 2015-03-01 DIAGNOSIS — Z7982 Long term (current) use of aspirin: Secondary | ICD-10-CM | POA: Diagnosis not present

## 2015-03-01 DIAGNOSIS — K0889 Other specified disorders of teeth and supporting structures: Secondary | ICD-10-CM | POA: Diagnosis present

## 2015-03-01 MED ORDER — FLUCONAZOLE 150 MG PO TABS: 150.0000 mg | ORAL_TABLET | Freq: Every day | ORAL | Status: DC

## 2015-03-01 MED ORDER — FLUCONAZOLE 200 MG PO TABS: 200.0000 mg | ORAL_TABLET | Freq: Every day | ORAL | Status: DC

## 2015-03-01 MED ORDER — HYDROCODONE-ACETAMINOPHEN 5-325 MG PO TABS: 1.0000 | ORAL_TABLET | ORAL | Status: DC | PRN

## 2015-03-01 MED ORDER — CLINDAMYCIN HCL 150 MG PO CAPS: 450.0000 mg | ORAL_CAPSULE | Freq: Three times a day (TID) | ORAL | Status: DC

## 2015-03-01 NOTE — ED Provider Notes (Signed)
CSN: [id number]     Arrival date & time 03/01/15  1735 History  By signing my name below, I, [facility] [name], attest that this documentation has been prepared under the direction and in the presence of Glean HessElizabeth Westfall, [address]. Electronically Signed: Randell PatientMarrissa [name], ED Scribe. 03/01/2015. 6:31 PM.     Chief Complaint  [name] presents with  . Dental Pain    The history is provided by the [name]. No language interpreter was used.    HPI Comments: [name] is a 59 y.o. female who presents to the Emergency Department complaining of constant, unchanging moderate upper dental pain onset 2 days ago. [name] describes the pain as soreness and feels as if her upper mouth is swollen. She notes previous symptoms in the past that resolved with I&D by her dentist. Per [name], she has been unable to schedule an appointment with her dentist or be seen at Urgent Care due to her insurance. She has tried peroxide, salt water gargles, and orajel with no relief of pain. [name] denies fever, chills, and difficultly swallowing.   Past Medical History  Diagnosis Date  . Allergy   . Anxiety   . Depression   . GERD (gastroesophageal reflux disease)   . SVT (supraventricular tachycardia) (HCC)   . Wegner's disease (congenital syphilitic osteochondritis)   . Leukocytoclastic vasculitis (HCC)     Dr. Marchelle Gearingamaswamy and Dr. Corliss Skainseveshwar  . Complication of anesthesia     difficulty waking up  . Dysrhythmia     svt  . Heart murmur     never seen cardiologist for this  . Sleep apnea     cpap  . Migraines     maybe one a year   Past Surgical History  Procedure Laterality Date  . Tonsillectomy  08/2002  . Carpal tunnel release Right 2002  . Tubal ligation  1980  . Total knee arthroplasty Right   . Bunionectomy Bilateral   . Joint replacement Right     knee  . Total knee arthroplasty Left 02/21/2014    Procedure: LEFT TOTAL KNEE ARTHROPLASTY;  Surgeon: Velna OchsPeter G Dalldorf, MD;  Location: [facility] OR;   Service: Orthopedics;  Laterality: Left;   Family History  Problem Relation Age of Onset  . Clotting disorder Mother 5665    Blood clot, foot amputation  . Arthritis Sister   . Heart attack Maternal Grandmother 51  . Cervical cancer Mother   . Breast cancer Sister   . Asthma Cousin    Social History  Substance Use Topics  . Smoking status: Never Smoker   . Smokeless tobacco: Never Used  . Alcohol Use: No   OB History    No data available      Review of Systems  Constitutional: Negative for fever and chills.  HENT: Positive for dental problem. Negative for trouble swallowing.       Allergies  Ampicillin; Clarithromycin; Codeine; Effexor; Flagyl; Fluoxetine hcl; Myrbetriq; Nsaids; Paxil; Reglan; Sulfa antibiotics; and Tetracycline  Home Medications   Prior to Admission medications   Medication Sig Start Date End Date Taking? Authorizing Provider  aspirin EC 325 MG EC tablet Take 1 tablet (325 mg total) by mouth 2 (two) times daily after a meal. 02/24/14   Elodia FlorenceAndrew Nida, PA-C  calcium citrate (CALCITRATE - DOSED IN MG ELEMENTAL CALCIUM) 950 MG tablet Take 200 mg of elemental calcium by mouth daily.    Historical Provider, MD  clindamycin (CLEOCIN) 150 MG capsule Take 3 capsules (450 mg total) by mouth 3 (  three) times daily. May dispense as  capsules 03/01/15   Mady Gemma, PA-C  diltiazem (CARDIZEM) 30 MG tablet Take 1 tablet (30 mg total) by mouth as needed (for palpitations). 06/17/13   Eber Hong, MD  esomeprazole (NEXIUM) 40 MG capsule Take 1 capsule (40 mg total) by mouth daily at 12 noon. 05/24/13   Nyoka Cowden, MD  fluconazole (DIFLUCAN) 150 MG tablet Take 1 tablet (150 mg total) by mouth daily. 03/01/15   Mady Gemma, PA-C  HYDROcodone-acetaminophen (NORCO/VICODIN) 5-325 MG tablet Take 1 tablet by mouth every 4 (four) hours as needed. 03/01/15   Mady Gemma, PA-C  methocarbamol (ROBAXIN) 500 MG tablet Take 1 tablet (500 mg total) by mouth  every 6 (six) hours as needed for muscle spasms. 02/24/14   [name], PA-C  potassium chloride (K-DUR,KLOR-CON) 10 MEQ tablet Take 10 mEq by mouth daily.    Historical Provider, MD  predniSONE (DELTASONE) 1 MG tablet Take 8 mg by mouth daily with breakfast.    Historical Provider, MD  Probiotic Product (PROBIOTIC DAILY PO) Take 1 tablet by mouth daily.     Historical Provider, MD  sertraline (ZOLOFT) 100 MG tablet TAKE 1 TABLET BY MOUTH EVERY NIGHT AT BEDTIME 01/31/14   Donita Brooks, MD    BP 144/72 mmHg  Pulse 70  Temp(Src) 97.6 F (36.4 C) (Oral)  Resp 20  SpO2 98% Physical Exam  Constitutional: She is oriented to person, place, and time. She appears well-developed and well-nourished. No distress.  HENT:  Head: Normocephalic and atraumatic.  Right Ear: Hearing, tympanic membrane, external ear and ear canal normal.  Left Ear: Hearing, tympanic membrane, external ear and ear canal normal.  Nose: Nose normal.  Mouth/Throat: Uvula is midline, oropharynx is clear and moist and mucous membranes are normal. No dental caries. No oropharyngeal exudate, posterior oropharyngeal edema, posterior oropharyngeal erythema or tonsillar abscesses.    Eyes: Conjunctivae and EOM are normal. Pupils are equal, round, and reactive to light. Right eye exhibits no discharge. Left eye exhibits no discharge. No scleral icterus.  Neck: Normal range of motion. Neck supple.  Cardiovascular: Normal rate and regular rhythm.   Pulmonary/Chest: Effort normal and breath sounds normal. No respiratory distress.  Musculoskeletal: Normal range of motion. She exhibits no edema or tenderness.  Neurological: She is alert and oriented to person, place, and time.  Skin: Skin is warm and dry. She is not diaphoretic.  Psychiatric: She has a normal mood and affect. Her behavior is normal.  Nursing note and vitals reviewed.   ED Course  Procedures   DIAGNOSTIC STUDIES: Oxygen Saturation is 98% on RA, normal by my  interpretation.    COORDINATION OF CARE: 6:08 PM Will prescribe antibiotics and pain medication. Advised pt to use warm compresses. Advised pt to return to ED if symptoms worsen. Discussed treatment plan with pt at bedside and pt agreed to plan.   Labs Review Labs Reviewed - No data to display  Imaging Review No results found.     EKG Interpretation None      MDM   Final diagnoses:  Pain, dental    59 year old female presents with left upper dental pain. Denies fever, chills, difficulty swallowing or handling her secretions, shortness of breath. [name] is afebrile. Vital signs stable. On exam, [name] has TTP and erythema superior to her left upper second bicuspid. No abscess. No swelling to floor of mouth to suggest ludwigs. [name] handling her secretions well. Symptoms consistent with dental infection.  Will treat with clindamycin, as [name] is allergic to penicillin, and short course of pain medication. [name] to follow up with dentist for further evaluation and management. Return precautions discussed. [name] verbalizes her understanding and is in agreement with plan.   BP 144/72 mmHg  Pulse 70  Temp(Src) 97.6 F (36.4 C) (Oral)  Resp 20  SpO2 98%  I personally performed the services described in this documentation, which was scribed in my presence. The recorded information has been reviewed and is accurate.    Mady Gemma, PA-C 03/01/15 2117  Eber Hong, MD 03/04/15 [phone]

## 2015-03-01 NOTE — ED Notes (Signed)
Pt c/o of severe tooth pain on her left upper jaw. She is concerned that it may be infected, she has been unable to get in with her dentist and could not take the pain.

## 2015-03-01 NOTE — Progress Notes (Signed)
Pt c/o left sided upper jaw tooth pain times three days. She stated,"I have left messages for my dentist and nobody has called me back. My pain is a 10 and I need some pain medication. " Family is at the bedside. No facial swelling noted.

## 2015-03-01 NOTE — Discharge Instructions (Signed)
1. Medications: vicodin for pain, clindamycin, usual home medications 2. Treatment: rest, drink plenty of fluids, use warm compresses 3. Follow Up: please followup with your dentist in 2-3 days for discussion of your diagnoses and further evaluation after today's visit; if you do not have a primary care doctor use the resource guide provided to find one; please return to the ER for fever, shortness of breath, difficulty swallowing or handling secretions   Emergency Department Resource Guide 1) Find a Doctor and Pay Out of Pocket Although you won't have to find out who is covered by your insurance plan, it is a good idea to ask around and get recommendations. You will then need to call the office and see if the doctor you have chosen will accept you as a new patient and what types of options they offer for patients who are self-pay. Some doctors offer discounts or will set up payment plans for their patients who do not have insurance, but you will need to ask so you aren't surprised when you get to your appointment.  2) Contact Your Local Health Department Not all health departments have doctors that can see patients for sick visits, but many do, so it is worth a call to see if yours does. If you don't know where your local health department is, you can check in your phone book. The CDC also has a tool to help you locate your state's health department, and many state websites also have listings of all of their local health departments.  3) Find a Walk-in Clinic If your illness is not likely to be very severe or complicated, you may want to try a walk in clinic. These are popping up all over the country in pharmacies, drugstores, and shopping centers. They're usually staffed by nurse practitioners or physician assistants that have been trained to treat common illnesses and complaints. They're usually fairly quick and inexpensive. However, if you have serious medical issues or chronic medical problems,  these are probably not your best option.  No Primary Care Doctor: - Call Health Connect at  [phone] - they can help you locate a primary care doctor that  accepts your insurance, provides certain services, etc. - Physician Referral Service- [phone]  Chronic Pain Problems: Organization         Address  Phone   Notes  [facility]  [phone] Patients need to be referred by their primary care doctor.   Medication Assistance: Organization         Address  Phone   Notes  [facility] [address] [place], [place] [address] [phone] --Must be a resident of [place] [facility] [place] -- Must have NO insurance coverage whatsoever (no Medicaid/ Medicare, etc.) -- The pt. MUST have a primary care doctor that directs their care regularly and follows them in the community   MedAssist  [phone]   [employer]  [phone]    Agencies that provide inexpensive medical care: Organization         Address  Phone   Notes  [facility]  [phone]) [phone]   [facility]    [phone]   [facility] [address] [place], [place] [address] [phone]   [facility] [address], [place] [phone]) [phone]   [facility]anned [facility]    [phone]   [facility]    [phone]   Community  Health and [place]  [address], [place] Phone:  [phone], Fax:  [phone] Hours of Operation:  9 am - 6 pm, M-F.  Also accepts Medicaid/Medicare and self-pay.  [facility] for [place] [address], [address], [place] Phone: [phone], Fax: [phone]. Hours of Operation:  8:30 am - 5:30 pm, M-F.  Also accepts Medicaid and self-pay.  [facility] [place] [address] [place] Phone: [phone]   [place], [place], [place] [phone], Ext. [phone] Mondays &  Thursdays: 7-9 AM.  First 15 patients are seen on a first come, first serve basis.    [place] Providers:  Organization         Address  Phone   Notes  [facility] [address], [place] [phone] Also accepts self-pay patients.  [facility] [address], [place]  [phone]   [place], [address], [place] [phone]   [facility] [address], [place] [phone]   Lucianne Lei [address], [place]   [phone] Only accepts [place] Access [place] patients after they have their name applied to their card.   Self-Pay (no insurance) in [facility]:  Organization         Address  Phone   Notes  Sickle Cell Patients, [facility] [place] [phone]   [facility] [place] [phone]   [facility] [place]  [place], [place], [place] [phone]   [facility]/Dr. Osei-Bonsu  [address] [place] or [address], [place] [phone] Phone number for both [place] and [place] locations is the same.  Urgent Medical and [facility] [address] [place] [phone]   [facility] [address] [place] or [address] [phone]   [facility] [address] [place] [phone], phone; [phone], fax Sees patients 1st and 3rd Saturday of every month.  Must not qualify for public or private insurance (i.e. Medicaid, Medicare, [place] Health Choice, Veterans' Benefits)  Household income should be no more than 200% of the poverty level The clinic cannot treat you if you are pregnant or think you are pregnant  Sexually transmitted diseases are not treated at the  clinic.    Dental Care: Organization         Address  Phone  Notes  [facility] Department of [facility] [place] [phone] Accepts children up to age 77 who are enrolled in [place] or [place]; pregnant women with a Medicaid card; and children who have applied for Medicaid or [place] Health Choice, but were declined, whose parents can pay a reduced fee at time of service.  [facility] Department of [facility]  [address], [place] [phone] Accepts children up to age 30 who are enrolled in [place] or [place]; pregnant women with a Medicaid card; and children who have applied for Medicaid or [place] Health Choice, but were declined, whose parents can pay a reduced fee at time of service.  [facility] PRO[facility]  [phone]  [place], [place] [phone] Patients are seen by appointment only. Walk-ins are not accepted. [place] will see patients 91 years of age and older. Monday - Tuesday (8am-5pm) Most Wednesdays (8:30-5pm) $30 per visit, cash only  [facility] Adult Dental Access PROGRAM  [address], [facility] [phone] Patients are seen by appointment only. Walk-ins are not accepted. [place] will see patients 43 years of age and older. One Wednesday Evening (Monthly: Volunteer Based).  $30 per visit, cash only  [place]  [phone] for adults; Children under age 76, call Graduate Pediatric Dentistry at [phone]. Children aged 29-14, please call [phone] to request a pediatric application.  Dental services are provided in all areas of dental care including fillings, crowns and bridges, complete and partial dentures, implants, gum treatment, root canals, and extractions. Preventive care is also provided. Treatment is provided to both adults and children. Patients are selected via a lottery and there is often a  waiting list.   [facility] [address], [place]  [phone] [email or web address]   [facility] [address] [place], [place] [phone], Ext. [phone] Second and Fourth Thursday of each month, opens at 6:30 AM; Clinic ends at 9 AM.  Patients are seen on a first-come first-served basis, and a limited number are seen during each clinic.   [facility]  [address] Hillard Danker [place], [place] [phone]   Eligibility Requirements You must have lived in [place], [place], or [place] counties for at least the last three months.   You cannot be eligible for state or federal sponsored [employer], including [employer], [place], or [employer].   You generally cannot be eligible for healthcare insurance through your employer.    How to apply: Eligibility screenings are held every Tuesday and Wednesday afternoon from 1:00 pm until 4:00 pm. You do not need an appointment for the interview!  [facility] [address] [place], [place]   [place]  [place] Department  [place]  [phone]    Behavioral Health Resources in the Community: Intensive Outpatient Programs Organization         Address  Phone  Notes  [place]. [address], [place], [place] [phone]   [facility] Outpatient [address] [place], [place]   ADS: Alcohol & Drug Svcs [address] [place], [place]   [place] [address]  [place], [place] or [phone]   Substance Abuse Resources Organization         Address  Phone  Notes  Alcohol and Drug Services  [phone]   [place]  [phone]   [facility] [place]   Chinita Pester  [phone]   Residential & Outpatient Substance Abuse  Program  [phone]   Psychological Services Organization         Address  Phone  Notes  [facility] [place]  [place]  [phone]   [place] [address], [place] or [phone]    Mobile Crisis Teams Organization         Address  Phone  Notes  [facility], Mobile Crisis Care Unit  [phone]   [facility]  [address] [place], [place]   Ivin Booty  [place] [phone]    Self-Help/Support Groups Organization         Address  Phone             Notes  [facility] [place] - variety of support groups  [place] Call for more information  Narcotics Anonymous (NA), [facility] [address], [employer] [place]  2 meetings at this location   Special educational needs teacher         Address  Phone  Notes  [facility] Residential Treatment [place],    [place]  [phone]   [facility]  [address], [place] [address], [place], [place]   [place], [place] [phone] Admissions: 8am-3pm M-F  Incentives Substance [place] [address],    [place], [place] [phone]   [facility] [address] [place], [place], [place]   [facility] [address]  [place], [place]   [facility] Programs - Intensive Outpatient [address] Kristeen [address], [place], [place]   [facility] ([place].) [address].,  [place], [place] [phone] or [phone]   [facility] Treatment Services (RTS) [address] [place], [place] Accepts Medicaid  [facility] [address]  [place] [phone] Substance Abuse/Addiction Treatment   [facility] Organization          Address  Phone  Notes  [facility]  [phone]   Domenic Schwab, PhD [address] Arlis Porta [place], [place]   [phone] or [phone]   [place], [place] [phone]   [facility] [address] [place], [place] [phone] Insurance/Medicaid/sponsorship through [facility] and Families [address], [address] [place]                                    [place], [place] [phone] [place] [address], [place] [phone]    Dr. Adele Schilder  [phone]   [facility] [place] Dept. 1) [address] [place] 2) [place] 3)  [place] [address], [place] [phone]  [phone]   [place] [phone] or [phone] (After Hours)

## 2015-09-03 DIAGNOSIS — R002 Palpitations: Secondary | ICD-10-CM | POA: Insufficient documentation

## 2015-09-03 DIAGNOSIS — J301 Allergic rhinitis due to pollen: Secondary | ICD-10-CM | POA: Insufficient documentation

## 2015-09-04 ENCOUNTER — Other Ambulatory Visit: Payer: Self-pay | Admitting: Adult Health

## 2015-09-04 DIAGNOSIS — Z1231 Encounter for screening mammogram for malignant neoplasm of breast: Secondary | ICD-10-CM

## 2015-09-21 ENCOUNTER — Ambulatory Visit: Payer: Self-pay

## 2015-09-26 ENCOUNTER — Ambulatory Visit: Payer: Self-pay

## 2015-09-27 ENCOUNTER — Other Ambulatory Visit: Payer: Self-pay | Admitting: Adult Health

## 2015-09-27 ENCOUNTER — Ambulatory Visit
Admission: RE | Admit: 2015-09-27 | Discharge: 2015-09-27 | Disposition: A | Discharge status: Home / Self Care | Payer: BLUE CROSS/BLUE SHIELD | Source: Ambulatory Visit | Attending: Adult Health | Admitting: Adult Health

## 2015-09-27 DIAGNOSIS — Z1231 Encounter for screening mammogram for malignant neoplasm of breast: Secondary | ICD-10-CM

## 2015-09-28 ENCOUNTER — Encounter (HOSPITAL_COMMUNITY): Payer: Self-pay | Admitting: Emergency Medicine

## 2015-09-28 ENCOUNTER — Emergency Department (HOSPITAL_COMMUNITY)
Admission: EM | Admit: 2015-09-28 | Discharge: 2015-09-28 | Disposition: A | Discharge status: Home / Self Care | Payer: BLUE CROSS/BLUE SHIELD | Attending: Emergency Medicine | Admitting: Emergency Medicine

## 2015-09-28 DIAGNOSIS — Z96652 Presence of left artificial knee joint: Secondary | ICD-10-CM | POA: Diagnosis not present

## 2015-09-28 DIAGNOSIS — Z96651 Presence of right artificial knee joint: Secondary | ICD-10-CM | POA: Diagnosis not present

## 2015-09-28 DIAGNOSIS — N3091 Cystitis, unspecified with hematuria: Secondary | ICD-10-CM | POA: Diagnosis not present

## 2015-09-28 DIAGNOSIS — R300 Dysuria: Secondary | ICD-10-CM | POA: Diagnosis present

## 2015-09-28 LAB — URINALYSIS, ROUTINE W REFLEX MICROSCOPIC
GLUCOSE, UA: NEGATIVE mg/dL
Ketones, ur: 15 mg/dL — AB
Nitrite: POSITIVE — AB
PH: 7 (ref 5.0–8.0)
PROTEIN: 100 mg/dL — AB
SPECIFIC GRAVITY, URINE: 1.025 (ref 1.005–1.030)

## 2015-09-28 LAB — URINE MICROSCOPIC-ADD ON

## 2015-09-28 MED ORDER — PHENAZOPYRIDINE HCL 200 MG PO TABS: 200.0000 mg | ORAL_TABLET | Freq: Three times a day (TID) | ORAL | Status: DC

## 2015-09-28 MED ORDER — NITROFURANTOIN MONOHYD MACRO 100 MG PO CAPS: 100.0000 mg | ORAL_CAPSULE | Freq: Two times a day (BID) | ORAL | Status: DC

## 2015-09-28 NOTE — ED Notes (Signed)
Pt reports bloody urine since last night. Denies recent fever. Pt reports a lot pressure and urgency with urination.

## 2015-09-28 NOTE — ED Provider Notes (Signed)
CSN: [id number]     Arrival date & time 09/28/15  0844 History   First MD Initiated Contact with Patient 09/28/15 0914     Chief Complaint  Patient presents with  . Dysuria      HPI Patient presents with urgency and frequency along with dysuria and bloody urine in the last 24-48 hours.  Denies fever chills.  Denies flank pain.  Denies nausea vomiting. Past Medical History  Diagnosis Date  . Allergy   . Anxiety   . Depression   . GERD (gastroesophageal reflux disease)   . SVT (supraventricular tachycardia) (HCC)   . Wegner's disease (congenital syphilitic osteochondritis)   . Leukocytoclastic vasculitis (HCC)     Dr. Marchelle Gearing and Dr. Corliss Skains  . Complication of anesthesia     difficulty waking up  . Dysrhythmia     svt  . Heart murmur     never seen cardiologist for this  . Sleep apnea     cpap  . Migraines     maybe one a year   Past Surgical History  Procedure Laterality Date  . Tonsillectomy  08/2002  . Carpal tunnel release Right 2002  . Tubal ligation  1980  . Total knee arthroplasty Right   . Bunionectomy Bilateral   . Joint replacement Right     knee  . Total knee arthroplasty Left 02/21/2014    Procedure: LEFT TOTAL KNEE ARTHROPLASTY;  Surgeon: Velna Ochs, MD;  Location: [facility];  Service: Orthopedics;  Laterality: Left;   Family History  Problem Relation Age of Onset  . Clotting disorder Mother 80    Blood clot, foot amputation  . Arthritis Sister   . Heart attack Maternal Grandmother 51  . Cervical cancer Mother   . Breast cancer Sister   . Asthma Cousin    Social History  Substance Use Topics  . Smoking status: Never Smoker   . Smokeless tobacco: Never Used  . Alcohol Use: No   OB History    No data available     Review of Systems  All other systems reviewed and are negative  Allergies  Ampicillin; Clarithromycin; Codeine; Effexor; Flagyl; Fluoxetine hcl; Myrbetriq; Nsaids; Paxil; Reglan; Sulfa antibiotics; and Tetracycline  Home  Medications   Prior to Admission medications   Medication Sig Start Date End Date Taking? Authorizing Provider  busPIRone (BUSPAR) 10 MG tablet Take 10 mg by mouth 2 (two) times daily. 08/20/15  Yes Historical Provider, MD  calcium citrate (CALCITRATE - DOSED IN MG ELEMENTAL CALCIUM) 950 MG tablet Take 200 mg of elemental calcium by mouth daily.   Yes Historical Provider, MD  cetirizine (ZYRTEC) 10 MG tablet Take 10 mg by mouth daily as needed for allergies.   Yes Historical Provider, MD  diltiazem (CARDIZEM) 30 MG tablet Take 1 tablet (30 mg total) by mouth as needed (for palpitations). 06/17/13  Yes Eber Hong, MD  esomeprazole (NEXIUM) 40 MG capsule Take 1 capsule (40 mg total) by mouth daily at 12 noon. 05/24/13  Yes Nyoka Cowden, MD  potassium chloride (K-DUR,KLOR-CON) 10 MEQ tablet Take 10 mEq by mouth daily.   Yes Historical Provider, MD  Probiotic Product (PROBIOTIC DAILY PO) Take 1 tablet by mouth daily.    Yes Historical Provider, MD  sertraline (ZOLOFT) 100 MG tablet TAKE 1 TABLET BY MOUTH EVERY NIGHT AT BEDTIME 01/31/14  Yes Donita Brooks, MD  nitrofurantoin, macrocrystal-monohydrate, (MACROBID) 100 MG capsule Take 1 capsule (100 mg total) by mouth 2 (two) times daily.  09/28/15   Nelva Nayobert Toinette Lackie, MD  phenazopyridine (PYRIDIUM) 200 MG tablet Take 1 tablet (200 mg total) by mouth 3 (three) times daily. 09/28/15   Nelva Nayobert Jamecia Lerman, MD   BP 121/48 mmHg  Pulse 58  Temp(Src) 97.9 F (36.6 C) (Oral)  Resp 16  Ht 5\' 3"  (1.6 m)  Wt 260 lb (117.935 kg)  BMI 46.07 kg/m2  SpO2 96% Physical Exam Physical Exam  Nursing note and vitals reviewed. Constitutional: She is oriented to person, place, and time. She appears well-developed and well-nourished. No distress.  HENT:  Head: Normocephalic and atraumatic.  Eyes: Pupils are equal, round, and reactive to light.  Neck: Normal range of motion.  Cardiovascular: Normal rate and intact distal pulses.   Pulmonary/Chest: No respiratory distress.   Abdominal: Normal appearance. She exhibits no distension.  Musculoskeletal: Normal range of motion.  Neurological: She is alert and oriented to person, place, and time. No cranial nerve deficit.  Skin: Skin is warm and dry. No rash noted.  Psychiatric: She has a normal mood and affect. Her behavior is normal.   ED Course  Procedures (including critical care time) Labs Review Labs Reviewed  URINALYSIS, ROUTINE W REFLEX MICROSCOPIC (NOT AT [facility]) - Abnormal; Notable for the following:    Color, Urine BROWN (*)    APPearance TURBID (*)    Hgb urine dipstick LARGE (*)    Bilirubin Urine MODERATE (*)    Ketones, ur 15 (*)    Protein, ur 100 (*)    Nitrite POSITIVE (*)    Leukocytes, UA MODERATE (*)    All other components within normal limits  URINE MICROSCOPIC-ADD ON - Abnormal; Notable for the following:    Squamous Epithelial / LPF 0-5 (*)    Bacteria, UA MANY (*)    All other components within normal limits  URINE CULTURE    Imaging Review No results found. I have personally reviewed and evaluated these images and lab results as part of my medical decision-making.    MDM   Final diagnoses:  Hemorrhagic cystitis        Nelva Nayobert Keisuke Hollabaugh, MD 09/28/15 1233

## 2015-09-28 NOTE — Discharge Instructions (Signed)

## 2015-09-30 LAB — URINE CULTURE: Culture: >=100000 — AB

## 2015-10-01 ENCOUNTER — Telehealth (HOSPITAL_BASED_OUTPATIENT_CLINIC_OR_DEPARTMENT_OTHER): Payer: Self-pay

## 2015-10-01 NOTE — Telephone Encounter (Signed)
Post ED Visit - Positive Culture Follow-up  Culture report reviewed by antimicrobial stewardship pharmacist:  []  Cassandra [name], Pharm.D. []  Cassandra [name], Pharm.D., BCPS []  Cassandra [name], Pharm.D. []  Cassandra [name], Pharm.D., BCPS []  Cassandra [name], [address] BoulevardPharm.D., BCPS, AAHIVP [x]  Cassandra [name], Pharm.D., BCPS, AAHIVP []  Cassandra [name], Pharm.D. []  Cassandra [name], [place]arm.D.  Positive urine culture Treated with Nitrofurantoin Monohyd Macro, organism sensitive to the same and no further patient follow-up is required at this time.  Cassandra [name], Cassandra [name] 10/01/2015, 10:29 AM

## 2015-12-24 ENCOUNTER — Other Ambulatory Visit: Payer: Self-pay | Admitting: Adult Health

## 2015-12-24 ENCOUNTER — Ambulatory Visit
Admission: RE | Admit: 2015-12-24 | Discharge: 2015-12-24 | Disposition: A | Discharge status: Home / Self Care | Payer: Medicare HMO | Source: Ambulatory Visit | Attending: Adult Health | Admitting: Adult Health

## 2015-12-24 DIAGNOSIS — R1011 Right upper quadrant pain: Secondary | ICD-10-CM | POA: Diagnosis not present

## 2015-12-28 ENCOUNTER — Other Ambulatory Visit: Payer: Self-pay | Admitting: Adult Health

## 2015-12-28 DIAGNOSIS — R1011 Right upper quadrant pain: Secondary | ICD-10-CM

## 2016-01-11 ENCOUNTER — Encounter
Admission: RE | Admit: 2016-01-11 | Discharge: 2016-01-11 | Disposition: A | Discharge status: Home / Self Care | Payer: Medicare HMO | Source: Ambulatory Visit | Attending: Adult Health | Admitting: Adult Health

## 2016-01-11 DIAGNOSIS — R1011 Right upper quadrant pain: Secondary | ICD-10-CM | POA: Insufficient documentation

## 2016-01-11 MED ORDER — TECHNETIUM TC 99M MEBROFENIN IV KIT
5.0000 | PACK | Freq: Once | INTRAVENOUS | Status: AC | PRN
  Administered 2016-01-11: 5.15 via INTRAVENOUS

## 2016-01-23 ENCOUNTER — Ambulatory Visit: Payer: Self-pay | Admitting: Rheumatology

## 2016-02-20 ENCOUNTER — Other Ambulatory Visit (INDEPENDENT_AMBULATORY_CARE_PROVIDER_SITE_OTHER): Payer: Self-pay

## 2016-02-20 ENCOUNTER — Encounter: Payer: Self-pay | Admitting: Internal Medicine

## 2016-02-20 ENCOUNTER — Ambulatory Visit (INDEPENDENT_AMBULATORY_CARE_PROVIDER_SITE_OTHER): Payer: Medicare HMO | Admitting: Internal Medicine

## 2016-02-20 VITALS — BP 120/72 | HR 87 | Ht 63.0 in | Wt 248.2 lb

## 2016-02-20 DIAGNOSIS — Z87448 Personal history of other diseases of urinary system: Secondary | ICD-10-CM

## 2016-02-20 DIAGNOSIS — Z87898 Personal history of other specified conditions: Secondary | ICD-10-CM

## 2016-02-20 DIAGNOSIS — I7782 Antineutrophilic cytoplasmic antibody (ANCA) vasculitis: Secondary | ICD-10-CM

## 2016-02-20 DIAGNOSIS — R0689 Other abnormalities of breathing: Secondary | ICD-10-CM

## 2016-02-20 DIAGNOSIS — R0600 Dyspnea, unspecified: Secondary | ICD-10-CM

## 2016-02-20 DIAGNOSIS — I776 Arteritis, unspecified: Secondary | ICD-10-CM

## 2016-02-20 DIAGNOSIS — Z23 Encounter for immunization: Secondary | ICD-10-CM | POA: Diagnosis not present

## 2016-02-20 HISTORY — DX: Personal history of other specified conditions: Z87.898

## 2016-02-20 LAB — CARDIAC PANEL
CK MB: 0.6 ng/mL (ref 0.3–4.0)
Relative Index: 2.4 calc (ref 0.0–2.5)
Total CK: 25 U/L (ref 7–177)

## 2016-02-20 LAB — URINALYSIS, MICROSCOPIC ONLY

## 2016-02-20 LAB — SEDIMENTATION RATE: SED RATE: 88 mm/h — AB (ref 0–30)

## 2016-02-20 NOTE — Addendum Note (Signed)
Addended by: Maurene CapesPOTTS, Wallace Gappa M on: 02/20/2016 04:49 PM   Modules accepted: Orders

## 2016-02-20 NOTE — Patient Instructions (Addendum)
ICD-9-CM ICD-10-CM   1. Dyspnea and respiratory abnormality 786.09 R06.00     R06.89   2. 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 History of gross hematuria V13.09 Z87.448     Concern vasculitis is active  PLAN - flu shot 02/20/2016  - urine microscopy for RBC -not dipstick test  - Blood work Serum: ESR, , ANA, DS-DNA, RF, anti-CCP, ssA, ssB, scl-70, ANCA screen, MPO, PR-3, Total CK,  GBM antibodiy - Do HRCT chest supine and prone   #Followup 2-4 weeks with me or APP but after completing test

## 2016-02-20 NOTE — Progress Notes (Signed)
Subjective:     Patient ID: Cassandra [name], female   DOB: 1955/06/27, 60 y.o.   MRN: [id number]  HPI  OV 08/09/2013  Chief Complaint  Patient presents with  . Pulomary Consult    Transition of care from MW to MR. C/o SOB with activity but since on pred pt is able to do more activity wihtout becoming dyspneic. Denies CP and cough.     Very poor historian. Significant cotton dust exposuire +  Says in 2010 had episodic dyspnea, cough, chest soreness that Rx with intermittent prednisone. REcollects pulmonary eval at that time. Details not clear. Denies having had bronch. Unclear how long she took prednisone.  However, in review of Dr Estanislado Pandy notes patient had bronchitis in dec 2010 and then developed rash (LE, UE) after levaquin. 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 Apparently at that time per PCP notes Labs at that time were ANA negative, ESR of 44, P-ANCA 1:160, MPO abs 27.5, PR-3 abs 4.9  Were all the positive ones. She had microscopic hematuria but normal creat 11/30/2008 (result in epic) SHe followed with DR Halford Chessman, renal and even pulmnary [facility] and derm at [facility] (Derm notes 2011 at [facility] reviewed). It appears she took prednisone for several months and then stopped. She then felt well and Symptoms resolved by 2012 and was doing well. Also lost to followup due to loss of insurance. [facility] derm noets 08/23/2009 indicated that in Sept 2010: "HEpc serologies positive but Hep C Quantitative negative" and esr 76, crp 28 p-anca 1:160, mpo, pr3 abs + c-anca neg . However, she had neck rash at that time and they thought twas "urticaria Multiforme""    Then in 03/16/13 developed acute left red eyde with swelling and then since then intermittent recurrence of dyspnea, dry cough, chest soreness with arthralgia.  She was initally seen in urgent care who had her follow with DR Dennard Schaumann which she did on 05/13/13. He ordered autoimmune profile and noted ESR 121, ANA at 1:640 and P-ANCA at 1:80 but normal complements  and DS-DNA. He was concerned about "SLE "flare up /r recurrence and placed her on prednisone.    He followed her again 05/20/13 and noted cough resolved.  She saw Der Melvyn Novas 05/24/13 who placed her on PPI with pred taper to off.  HE shared concern for vasculitis given lab results but deferred to [facility] because patient was set up for [facility]. She saw PCP [facility] TOM, MD on 06/02/13 and was off prednisone at this time and noted to have cough, congestion, dyspnea, sinus issues and right pleuristy.  CXR was clear (personally reviewed)  On Exam she was Wheezing and had rales.  HE treated with levaquin but she did not improve. HE gave her prednisone and wheezing resolved and she felt well without wheeze when he saw her again 06/09/13   She then followed at [facility] Dr Idamae Schuller: a tthis time off prednisone for 10 days. She was very poor historian at this visit but her cough had recurred with clear sputum with fatigue, itchy skin, mouth sores, conjunctivies and along with symptoms of slep anpnea. Also, admitted to severe GERD and sinus congestion. PFT at this visit was essentialy normal.    She has needed repeated and intermittent prednisone courses. REports this is only thing that helps. Has seen Dr Idamae Schuller at [facility] when off prednisone but PFT near normal and apparently she has sleep study pending there. SHe has been told off different autoimmune disease at urgent care and other places that she  is confused. Currently back on prednisone. Dr Estanislado Pandy has referred her to me so she can have a local pulmonologist but she is ok with just following at one place ([facility] [place] v both). She is frustrated she is seeing many docs but at same time has very poor insight into her problems    Autoimmune May 14, 2013 - no on seroids  P  ANCA 1:80 and positive, ANA 1:640, ESR 121 (was 19 in Jan 2015)  DS-DNA, C Anca and complement - normal - feb 2015   UA 06/02/13 - just came off steroids   - no hematuria  PFT June 28, 2013 at  [facility] - done not on prednisone  - FVC 2.8L/94%, TLC 3.4/70%, DLCO 15.4/82%. Spirometry with flow volume loops demonstrates no airway obstruction. Lung  volumes are consistent with mild restrictive lung disease. The diffusing  capacity for carbon monoxide, a reflection of alveolar-capillary gas  transport, is normal.   CBC 2010 t0 April 2015: eos 2,6%, 3% and 0%  Reviewed Dr Ralene Cork notes from 07/22/13  Autoimmune labs 07/22/13  -P-ANCA, +ve @ 1:640, MPO > 8, ESR 49, ANA positive  @ 1:640, Cardiolipin IgM - 17 and high - C-ANCA, PR-3, CK 31, HIV , RF ,DSDNA, CCp, ssA, ssB, ENA, RNP, scl-70  - ALL negative - Hep C anitbody - REACTIVE.   - HepBSag - negative, Quantiferon Gold - Negative  CT chest 08/03/13 - done on  2 weeks pf prednisone EXAM:  CT CHEST WITHOUT CONTRAST  TECHNIQUE: CT chest 08/03/13 Multidetector CT imaging of the chest was performed following the  standard protocol without intravenous contrast. High resolution  imaging of the lungs, as well as inspiratory and expiratory imaging,  was performed.  COMPARISON: 12/04/2008.  FINDINGS:  No pathologically enlarged mediastinal lymph nodes. Hilar regions  are difficult to definitively evaluate but appear grossly  unremarkable. No axillary adenopathy. Heart size within normal  limits. There may be a small amount of pericardial fluid or  thickening, new from the prior exam. Small hiatal hernia.  Image quality is somewhat degraded by respiratory motion. Very mild  subpleural reticulation is seen in the right lung, without definite  zonal predominance. Very minimal involvement of the lingula and left  lower lobe (example series 8 and image 21). Findings are unchanged  from 12/04/2008. No traction bronchiectasis/ bronchiolectasis,  ground-glass, architectural distortion or honeycombing. No air  trapping on inspiratory and expiratory imaging. No pleural fluid.  Airway is unremarkable.  Incidental imaging of the upper abdomen  shows the visualized  portions of the liver, gallbladder, adrenal glands, kidneys, spleen,  pancreas, stomach and bowel to be grossly unremarkable. No upper  abdominal adenopathy. No worrisome lytic or sclerotic lesions.  Degenerative changes are seen in the spine.  IMPRESSION:  1. Minimal subpleural reticulation, right greater than left,  unchanged from 12/04/2008. 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  2. Question small amount of pericardial fluid or thickening, new.  Electronically Signed  By: Lorin Picket M.D.  On: 08/03/2013 12:07      ASSESSMENT Tough case. Poor history and intermittent steroids during data point eval for pulmonary making it hard to arrive at specific pulmonary diagnosis. However, serial data at 3 points (2010, feb 2015, may 2015) clearly demonstrate when symptomatic with respiratory issues her ESR is high, she is P-ANCA screen positive and c/w wit this MPO antibody positive. Her symptoms respond to steroids. Taken together, strongly c/w MPA disease - Vasculitis.  However, fact April 2015 PFT at [facility] was normal (done between steroid courses), and CT Chest 08/03/13 showed very mild early and subtle ILD (so subtle not reflected in PFT) that is unchanged since 2010. This suggests pulmonary symptoms are airway related and c/w this she ws noticed to have wheezing in march 2015 PCP visit.  Unfortunately we do not have PFT when symptoms were active ([facility] PFt April 2015, done when 10 days off prednisone so not fully reflective of being off prednisone)  DDx is cryoglobulinemia (prior leukocytoclastic vasculitis and Hep C positive), Erick Alley - I think these are unlikely but will leave it to Dr Bronson Curb to make call on this   Rx PLAN  - It would be nice to have tissue diagnosis of vasculitis but will need it from nose or airway when symptomatic (these sites are @ risk for false negative). Or, will have to biopsy from renal or lungs if she  were to worsen  -  Would continue prednisone -given obesity will be nice to find the lowest floor dose; will let Dr Estanislado Pandy know this   - ? Does she need 2nd immunomodulator. In 2015: Given no frank alveolar hemorrhage, stable creatinine, no hematuria, normal PFT, CT showing stable ILD since 2010 (though lot of these data points have been elicited with her on prednisone or off prednisone for several days only) - perhaps no role for rituxan/cytoxan but for methotrexate  - we can consolidate followup here at [place]   OV 09/12/2013  Chief Complaint  Patient presents with  . Follow-up    Pt states her breathing is doing better. C/o intermittent dyspnea with activity. Denies cough and CP.     dyspnea better with prednisone but worse when pred tapered. Class 2 doe. Wants to increase prednisone. No new issues. Continues to be a poort historian. Has sleep apnea workup pending at [place] 11/16/2013  Chief Complaint  Patient presents with  . Follow-up    Pt states her breathing is unchanged since last OV. Pt c/o DOE, mild dry cough. Pt denies CP/tightness.     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 This is being managed by her rheumatologist. Looks like she's also been started on injection methotrexate which is currently on hold because she's having some dental work at [employer]. Her dyspnea continues even though it is slightly better with prednisone. She has a sense that her obesity with a body mass index of 42 is playing a role and dyspnea. She is trying to lose weight and try to exercise more but is struggling with executing this. She had sleep apnea workup at [facility] and has been diagnosed with moderate sleep apnea. She is on CPAP. She wants to switch sleep apnea care to [place] @ [place] due to logistical reasons  Otherwise no new problems   OV  02/20/2016  Chief Complaint  Patient presents with  . Acute Visit    SOB and chest pain since September. Having some right-sided pain that has been on and off since Sept.     Follow-up multifactorial dyspnea associated with obesity in the setting of mild-moderate anca vasculitis   Last seen September 2015. At that time she was on methotrexate and prednisone for her rheumatologist Dr. Bronson Curb after that she failed to follow-up with her. She's been off immunomodulators for the last 2 years according to her history.  She says she has not had any new interval medical diagnosis although July 2017 according to her and review of the chart she had gross hematuria and was treated with antibiotics in the emergency department and it resolved. Then somewhere in August/September 2017 she started working out at [employer] and started developing some low backache/right lower chest pain in the back. This resolved after she stopped exercising. Now in the last few weeks she's been exercising in the gym. She basically only doing her mild aerobic walking exercises. She says she has new onset dyspnea that is worse with exertion and relieved by rest. Moderate in intensity. There is some associated cough. There is no associated pain. This no wheezing or fever or chills. She denies any hematuria.   has a past medical history of Allergy; Anxiety; Complication of anesthesia; Depression; Dysrhythmia; GERD (gastroesophageal reflux disease); Heart murmur; Leukocytoclastic vasculitis ([place]); Migraines; Sleep apnea; SVT (supraventricular tachycardia) ([place]); and Wegner's disease (congenital syphilitic osteochondritis).   reports that she has never smoked. She has never used smokeless tobacco.  Past Surgical History:  Procedure Laterality Date  . BUNIONECTOMY Bilateral   . CARPAL TUNNEL RELEASE Right 2002  . JOINT REPLACEMENT Right    knee  . TONSILLECTOMY  08/2002  . TOTAL KNEE ARTHROPLASTY Right   . TOTAL KNEE ARTHROPLASTY  Left 02/21/2014   Procedure: LEFT TOTAL KNEE ARTHROPLASTY;  Surgeon: Hessie Dibble, MD;  Location: [facility];  Service: Orthopedics;  Laterality: Left;  . TUBAL LIGATION  1980    Allergies  Allergen Reactions  . Ampicillin     Rash   . Clarithromycin     unknown  . Codeine     Head feels like its crawling  . Effexor [Venlafaxine]     Hives   . Flagyl [Metronidazole]     unknown  . Fluoxetine Hcl Itching    Deep Itch   . Myrbetriq [Mirabegron]     Caused back pai, dry mouth  . Nsaids Other (See Comments)    Hypersensitivity vasculitis  . Paxil [Paroxetine]   . Reglan [Metoclopramide]     Blister in mouth   . Sulfa Antibiotics     Stomach irritation  . Tetracycline     Severe stomach pain    Immunization History  Administered Date(s) Administered  . Influenza Split 11/27/2011, 12/15/2012, 11/25/2013  . Pneumococcal Polysaccharide-23 11/15/2013  . Td 12/16/2003    Family History  Problem Relation Age of Onset  . Clotting disorder Mother 33    Blood clot, foot amputation  . Arthritis Sister   . Heart attack Maternal Grandmother 51  . Cervical cancer Mother   . Breast cancer Sister   . Asthma Cousin      Current Outpatient Prescriptions:  .  busPIRone (BUSPAR) 10 MG tablet, Take 10 mg by mouth 2 (two) times daily., Disp: , Rfl: 2 .  calcium citrate (CALCITRATE - DOSED IN MG ELEMENTAL CALCIUM) 950 MG tablet, Take 200 mg of elemental calcium by mouth daily., Disp: , Rfl:  .  cetirizine (ZYRTEC) 10 MG tablet, Take 10 mg by mouth daily as needed for allergies., Disp: , Rfl:  .  diltiazem (CARDIZEM) 30 MG tablet, Take 1 tablet (30 mg total) by mouth as needed (for palpitations)., Disp: 30 tablet, Rfl: 1 .  esomeprazole (NEXIUM) 40 MG capsule, Take 1 capsule (40 mg total) by mouth daily at 12 noon., Disp: 30 capsule, Rfl: 2 .  potassium chloride (K-DUR,KLOR-CON) 10 MEQ tablet, Take 10 mEq by mouth  daily., Disp: , Rfl:  .  Probiotic Product (PROBIOTIC DAILY PO), Take 1  tablet by mouth daily. , Disp: , Rfl:  .  sertraline (ZOLOFT) 100 MG tablet, TAKE 1 TABLET BY MOUTH EVERY NIGHT AT BEDTIME, Disp: 90 tablet, Rfl: 3    Review of Systems     Objective:   Physical Exam  Constitutional: She is oriented to person, place, and time. She appears well-developed and well-nourished. No distress.  HENT:  Head: Normocephalic and atraumatic.  Right Ear: External ear normal.  Left Ear: External ear normal.  Mouth/Throat: Oropharynx is clear and moist. No oropharyngeal exudate.  Eyes: Conjunctivae and EOM are normal. Pupils are equal, round, and reactive to light. Right eye exhibits no discharge. Left eye exhibits no discharge. No scleral icterus.  Neck: Normal range of motion. Neck supple. No JVD present. No tracheal deviation present. No thyromegaly present.  Cardiovascular: Normal rate, regular rhythm, normal heart sounds and intact distal pulses.  Exam reveals no gallop and no friction rub.   No murmur heard. Pulmonary/Chest: Effort normal and breath sounds normal. No respiratory distress. She has no wheezes. She has no rales. She exhibits no tenderness.  Scattered crackles  Abdominal: Soft. Bowel sounds are normal. She exhibits no distension and no mass. There is no tenderness. There is no rebound and no guarding.  Musculoskeletal: Normal range of motion. She exhibits no edema or tenderness.  Lymphadenopathy:    She has no cervical adenopathy.  Neurological: She is alert and oriented to person, place, and time. She has normal reflexes. No cranial nerve deficit. She exhibits normal muscle tone. Coordination normal.  Skin: Skin is warm and dry. No rash noted. She is not diaphoretic. No erythema. No pallor.  Psychiatric: Judgment normal.  Poor historian  Vitals reviewed.    Vitals:   02/20/16 1543  BP: 120/72  Pulse: 87  SpO2: 95%  Weight: 248 lb 3.2 oz (112.6 kg)  Height: _0  (1.6 m)   Estimated body mass index is 43.97 kg/m as calculated from the  following:   Height as of this encounter: _1  (1.6 m).   Weight as of this encounter: 248 lb 3.2 oz (112.6 kg).        Assessment:       ICD-9-CM ICD-10-CM   1. Dyspnea and respiratory abnormality 786.09 R06.00     R06.89   2. 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 History of gross hematuria V13.09 Z87.448        Plan:      Concern vasculitis is active/recurred esp with immunomodulatoion  PLAN - flu shot 02/20/2016  - urine microscopy for RBC -not dipstick test  - Blood work Serum: ESR, , ANA, DS-DNA, RF, anti-CCP, ssA, ssB, scl-70, ANCA screen, MPO, PR-3, Total CK,  GBM antibodiy - Do HRCT chest supine and prone   #Followup 2-4 weeks with me or APP but after completing test    Dr. Brand Males, M.D., [facility].C.P Pulmonary and Critical Care Medicine Staff Physician [place] [facility] Pager: [phone], If no answer or between  15:00h - 7:00h: call [phone]  02/20/2016 4:25 PM

## 2016-02-21 LAB — ANTI-NUCLEAR AB-TITER (ANA TITER)

## 2016-02-21 LAB — CYCLIC CITRUL PEPTIDE ANTIBODY, IGG

## 2016-02-21 LAB — ANTI-DNA ANTIBODY, DOUBLE-STRANDED: ds DNA Ab: 2 IU/mL

## 2016-02-21 LAB — RHEUMATOID FACTOR

## 2016-02-21 LAB — ANA: Anti Nuclear Antibody(ANA): POSITIVE — AB

## 2016-02-21 LAB — ANTI-SCLERODERMA ANTIBODY: Scleroderma (Scl-70) (ENA) Antibody, IgG: <1

## 2016-02-21 LAB — GLOMERULAR BASEMENT MEMBRANE ANTIBODIES

## 2016-02-21 LAB — PROTEINASE-3 AUTO ABS

## 2016-02-22 LAB — ANCA SCREEN W REFLEX TITER: ANCA SCREEN: POSITIVE — AB

## 2016-02-22 LAB — RFLX P-ANCA TITER: P-ANCA: 1:160 {titer} — ABNORMAL HIGH

## 2016-02-22 LAB — ANTIMYELOPEROXIDASE (MPO) ABS

## 2016-02-23 ENCOUNTER — Telehealth: Payer: Self-pay | Admitting: Internal Medicine

## 2016-02-23 NOTE — Telephone Encounter (Signed)
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 She neds tcome in to discuss Rituxan Rx   Dr. Kalman ShanMurali Raudel Bazen, M.D., [facility].P Pulmonary and Critical Care Medicine Staff Physician [place] [facility] [place] [facility] Pager: [phone], If no answer or between  15:00h - 7:00h: call [phone]  02/23/2016 11:42 AM

## 2016-02-25 NOTE — Telephone Encounter (Signed)
Your next available appt is not until 04/15/16.  Pt seeing SG on 12/21.   Does pt need to be worked in sooner with you before next available?

## 2016-02-28 ENCOUNTER — Ambulatory Visit (INDEPENDENT_AMBULATORY_CARE_PROVIDER_SITE_OTHER)
Admission: RE | Admit: 2016-02-28 | Discharge: 2016-02-28 | Disposition: A | Discharge status: Home / Self Care | Payer: Medicare HMO | Source: Ambulatory Visit | Attending: Internal Medicine | Admitting: Internal Medicine

## 2016-02-28 DIAGNOSIS — R0689 Other abnormalities of breathing: Secondary | ICD-10-CM

## 2016-02-28 DIAGNOSIS — I7782 Antineutrophilic cytoplasmic antibody (ANCA) vasculitis: Secondary | ICD-10-CM

## 2016-02-28 DIAGNOSIS — R0600 Dyspnea, unspecified: Secondary | ICD-10-CM

## 2016-02-28 DIAGNOSIS — I776 Arteritis, unspecified: Secondary | ICD-10-CM

## 2016-02-28 NOTE — Telephone Encounter (Signed)
SG need to start chronic daily prednisone @ 40mg  per day (after d/w patient) 03/06/16 and at [facility] in jan 2018 with me I can discuss cytoxan v rituxan  Also SG to test for Quantiferon gold, HIV, G6PD - tests   (cc to triage and SG)  Thanks  Dr. Kalman ShanMurali Ruthetta Koopmann, M.D., [facility].C.P Pulmonary and Critical Care Medicine Staff Physician [place] [facility] [place] [facility] Pager: [phone], If no answer or between  15:00h - 7:00h: call [phone]  02/28/2016 3:48 PM

## 2016-02-29 ENCOUNTER — Telehealth: Payer: Self-pay | Admitting: Internal Medicine

## 2016-02-29 NOTE — Telephone Encounter (Signed)
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 REcommend starting prednisone 60mg  per day for 2 weeks and then 40mg  daily. AFter that, see me for cytoxan v rituxan discussion  Thanks  Dr. Kalman ShanMurali Lasasha Brophy, M.D., [facility].C.P Pulmonary and Critical Care Medicine Staff Physician [place] [facility] [place] [facility] Pager: [phone], If no answer or between  15:00h - 7:00h: call [phone]  02/29/2016 3:31 PM

## 2016-03-04 DIAGNOSIS — N3281 Overactive bladder: Secondary | ICD-10-CM | POA: Insufficient documentation

## 2016-03-06 ENCOUNTER — Encounter: Payer: Self-pay | Admitting: Acute Care

## 2016-03-06 ENCOUNTER — Ambulatory Visit (INDEPENDENT_AMBULATORY_CARE_PROVIDER_SITE_OTHER): Payer: Medicare HMO | Admitting: Acute Care

## 2016-03-06 ENCOUNTER — Other Ambulatory Visit: Payer: Medicare HMO

## 2016-03-06 VITALS — BP 152/90 | HR 85 | Ht 63.0 in | Wt 242.2 lb

## 2016-03-06 DIAGNOSIS — I7782 Antineutrophilic cytoplasmic antibody (ANCA) vasculitis: Secondary | ICD-10-CM

## 2016-03-06 DIAGNOSIS — I776 Arteritis, unspecified: Secondary | ICD-10-CM

## 2016-03-06 MED ORDER — PREDNISONE 20 MG PO TABS: ORAL_TABLET | ORAL | 0 refills | Status: DC

## 2016-03-06 NOTE — Telephone Encounter (Signed)
Pt seen by SG today 03/06/16. Will sign off.

## 2016-03-06 NOTE — Assessment & Plan Note (Signed)
Flare of pulmonary vasculitis, with worsening since 2015 per HRCT done 02/28/16  Plan: We will do some labs today in the office to complete your work up. ( Quantiferon Gold, HIV, G6PD)  Per Dr. Jane Canaryamaswamy's request, we will start prednisone today. Start 60 mg daily for 2 weeks, then drop dose to 40 mg daily until you see Dr. Marchelle Gearingamaswamy in early Jan. 2018.( January 16@0900 ) Follow up with Dr. Marchelle Gearingamaswamy 04/01/2016@0900  to discuss further treatment options. Please contact office for sooner follow up if symptoms do not improve or worsen or seek emergency care

## 2016-03-06 NOTE — Patient Instructions (Addendum)
It is nice to meet you today.  We will do some labs today in the office to complete your work up. Per Dr. Jane Canaryamaswamy's request, we will start prednisone today. Start 60 mg daily for 2 weeks, then drop dose to 40 mg daily until you see Dr. Marchelle Gearingamaswamy in early Jan. 2018.( January 16@0900 ) Follow up with Dr. Marchelle Gearingamaswamy 04/01/2016@0900  Please contact office for sooner follow up if symptoms do not improve or worsen or seek emergency care

## 2016-03-06 NOTE — Progress Notes (Signed)
History of Present Illness Cassandra [name] is a 60 y.o. 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 Chase Caller.   03/06/2016 Follow UP OV: Pt. Presents to the office for follow up of her ANCA Vasculitis. She was last seen in the office by Dr. Chase Caller on 02/20/2016  for dyspnea. At that time he ordered:   - urine microscopy for RBC -not dipstick test  - Blood work Serum: ESR,= 88, , ANA= 1: 1280 , DS-DNA, RF= <14, anti-CCP=<16, ssA, ssB, scl-70, ANCA screen, MPO,    PR-3, Total CK,  GBM    antibodiy - Do HRCT chest supine and prone>> consistent with pulmonary vasculitis, significantly worsened since 2015 - Follow up 2-4 weeks    She presents today stating that she is feeling like "crap". She states that she has a cough, scantly productive for clear to light green secretions. She states that about 1 week ago she started having night sweats. She states she does not feel like she has a fever, but she has not checked her temperature. She is a very poor historian. She states she has the vasculitis rash on the bottom of her feet , legs and arms at the present time. She states she has had weight loss of about 8 pounds, which she states is good. Per Dr. Golden Pop telephone message, we will start Prednisone 60 mg daily  x 2 weeks and then drop to 40 mg  Daily with follow up with Dr. Chase Caller in early January 2018 to discuss cytoxan vs. Rituxan medications. Additionally we will check Quantiferon Gold, HIV and G6PD labs today.She is asking about Methotrexate use today. She did use this medication in the past and had a remission of her symptms for about 2 years. She states that she has had worsening dyspnea with exertion since November 2017. In the past she has seen Dr. Patrecia Pour in the past for this. She has not seen her recently.We will treat with prednisone now per Dr. Chase Caller, check labs as stated above and  Schedule  follow up appointment 04/01/2016 with Dr. Chase Caller to discuss further treatment  options. She is agreeable with this approach.    HRCT 02/28/16 IMPRESSION: 1. Extensive patchy confluent and vaguely nodular peribronchovascular ground-glass opacities throughout both lungs with associated mild patchy subpleural reticulation. This spectrum of findings is most consistent with pulmonary vasculitis given the clinical history, and has significantly worsened since the 08/03/2013 high-resolution chest CT study. These findings are not consistent with usual interstitial pneumonia (UIP). 2. Aortic atherosclerosis.  One vessel coronary atherosclerosis. 3. Trace right pleural effusion. 4. Small hiatal hernia.    Past medical hx Past Medical History:  Diagnosis Date  . Allergy   . Anxiety   . Complication of anesthesia    difficulty waking up  . Depression   . Dysrhythmia    svt  . GERD (gastroesophageal reflux disease)   . Heart murmur    never seen cardiologist for this  . Leukocytoclastic vasculitis (HCC)    Dr. Chase Caller and Dr. Estanislado Pandy  . Migraines    maybe one a year  . Sleep apnea    cpap  . SVT (supraventricular tachycardia) ([place])   . Wegner's disease (congenital syphilitic osteochondritis)      Past surgical hx, Family hx, Social hx all reviewed.  Current Outpatient Prescriptions on File Prior to Visit  Medication Sig  . cetirizine (ZYRTEC) 10 MG tablet Take 10 mg by mouth daily as needed for allergies.  Marland Kitchen esomeprazole (NEXIUM) 40  MG capsule Take 1 capsule (40 mg total) by mouth daily at 12 noon.  . potassium chloride (K-DUR,KLOR-CON) 10 MEQ tablet Take 10 mEq by mouth daily.  . sertraline (ZOLOFT) 100 MG tablet TAKE 1 TABLET BY MOUTH EVERY NIGHT AT BEDTIME  . busPIRone (BUSPAR) 10 MG tablet Take 10 mg by mouth 2 (two) times daily.  . calcium citrate (CALCITRATE - DOSED IN MG ELEMENTAL CALCIUM) 950 MG tablet Take 200 mg of elemental calcium by mouth daily.  Marland Kitchen diltiazem (CARDIZEM) 30 MG tablet Take 1 tablet (30 mg total) by mouth as needed (for  palpitations). (Patient not taking: Reported on 03/06/2016)  . Probiotic Product (PROBIOTIC DAILY PO) Take 1 tablet by mouth daily.    No current facility-administered medications on file prior to visit.      Allergies  Allergen Reactions  . Ampicillin     Rash   . Clarithromycin     unknown  . Codeine     Head feels like its crawling  . Effexor [Venlafaxine]     Hives   . Flagyl [Metronidazole]     unknown  . Fluoxetine Hcl Itching    Deep Itch   . Myrbetriq [Mirabegron]     Caused back pai, dry mouth  . Nsaids Other (See Comments)    Hypersensitivity vasculitis  . Paxil [Paroxetine]   . Reglan [Metoclopramide]     Blister in mouth   . Sulfa Antibiotics     Stomach irritation  . Tetracycline     Severe stomach pain    Review Of Systems:  Constitutional:   +  weight loss, +night sweats,  Fevers, +chills, fatigue, or  lassitude.  HEENT:   No headaches,  Difficulty swallowing,  Tooth/dental problems, or  Sore throat,                No sneezing, itching, ear ache, nasal congestion, post nasal drip,   CV:  No chest pain,  Orthopnea, PND, swelling in lower extremities, anasarca, dizziness, palpitations, syncope.   GI  No heartburn, indigestion, abdominal pain, nausea, vomiting, diarrhea, change in bowel habits, loss of appetite, bloody stools.   Resp: + shortness of breath with exertion not  at rest.  No excess mucus, no productive cough,  + non-productive cough,  No coughing up of blood.  No change in color of mucus.  No wheezing.  No chest wall deformity  Skin: + rash to feet, legs and arms, or lesions. Itchy scalp  GU: no dysuria, change in color of urine, no urgency or frequency.  No flank pain, no hematuria   MS:  No joint pain or swelling.  No decreased range of motion.  No back pain.  Psych:  No change in mood or affect. + depression , +  anxiety.  No memory loss.   Vital Signs BP (!) 152/90 (BP Location: Left Arm, Cuff Size: Normal)   Pulse 85   Ht _0   (1.6 m)   Wt 242 lb 3.2 oz (109.9 kg)   SpO2 97%   BMI 42.90 kg/m    Physical Exam:  General- No distress,  A&Ox3, pleasant, obese ENT: No sinus tenderness, TM clear, pale nasal mucosa, no oral exudate,no post nasal drip, no LAN Cardiac: S1, S2, regular rate and rhythm, no murmur Chest: No wheeze/ faint basilar rales/ dullness; no accessory muscle use, no nasal flaring, no sternal retractions Abd.: Soft Non-tender, obese Ext: No clubbing cyanosis, edema Neuro:  normal strength, A&O to person,  place and  time  Skin: Rash to lower legs, feet and arms. Psych: anxious, depressed   Assessment/Plan  ANCA-associated vasculitis Flare of pulmonary vasculitis, with worsening since 2015 per HRCT done 02/28/16  Plan: We will do some labs today in the office to complete your work up. ( Quantiferon Gold, HIV, G6PD)  Per Dr. Golden Pop request, we will start prednisone today. Start 60 mg daily for 2 weeks, then drop dose to 40 mg daily until you see Dr. Chase Caller in early Jan. 2018.( January 16_0 ) Follow up with Dr. Chase Caller 04/01/2016_1  to discuss further treatment options. Please contact office for sooner follow up if symptoms do not improve or worsen or seek emergency care      Magdalen Spatz, NP 03/06/2016  1:23 PM

## 2016-03-07 LAB — GLUCOSE 6 PHOSPHATE DEHYDROGENASE: G-6PDH: 19.8 U/g{Hb} (ref 7.0–20.5)

## 2016-03-11 ENCOUNTER — Telehealth: Payer: Self-pay | Admitting: Internal Medicine

## 2016-03-11 NOTE — Telephone Encounter (Signed)
Spoke with pt. States that she is having a reaction to Prednisone. Reports tongue burning and swollen lips. Advised the pt that she would need to be seen. We do not have any available appointments. Advised the pt to go to the nearest urgent care or ED. She verbalized understanding. Nothing further was needed.

## 2016-03-13 ENCOUNTER — Inpatient Hospital Stay
Admission: EM | Admit: 2016-03-13 | Discharge: 2016-03-19 | DRG: 315 | Disposition: A | Discharge status: Home / Self Care | Payer: Medicare HMO | Attending: Internal Medicine | Admitting: Internal Medicine

## 2016-03-13 ENCOUNTER — Encounter: Payer: Self-pay | Admitting: Emergency Medicine

## 2016-03-13 DIAGNOSIS — T380X5A Adverse effect of glucocorticoids and synthetic analogues, initial encounter: Secondary | ICD-10-CM | POA: Diagnosis present

## 2016-03-13 DIAGNOSIS — Z888 Allergy status to other drugs, medicaments and biological substances status: Secondary | ICD-10-CM

## 2016-03-13 DIAGNOSIS — K921 Melena: Secondary | ICD-10-CM | POA: Diagnosis present

## 2016-03-13 DIAGNOSIS — R310 Gross hematuria: Secondary | ICD-10-CM

## 2016-03-13 DIAGNOSIS — Z6841 Body Mass Index (BMI) 40.0 and over, adult: Secondary | ICD-10-CM | POA: Diagnosis not present

## 2016-03-13 DIAGNOSIS — M300 Polyarteritis nodosa: Secondary | ICD-10-CM | POA: Diagnosis present

## 2016-03-13 DIAGNOSIS — R042 Hemoptysis: Secondary | ICD-10-CM | POA: Diagnosis present

## 2016-03-13 DIAGNOSIS — R0602 Shortness of breath: Secondary | ICD-10-CM

## 2016-03-13 DIAGNOSIS — Z79899 Other long term (current) drug therapy: Secondary | ICD-10-CM | POA: Diagnosis not present

## 2016-03-13 DIAGNOSIS — Z886 Allergy status to analgesic agent status: Secondary | ICD-10-CM

## 2016-03-13 DIAGNOSIS — M549 Dorsalgia, unspecified: Secondary | ICD-10-CM

## 2016-03-13 DIAGNOSIS — I10 Essential (primary) hypertension: Secondary | ICD-10-CM | POA: Diagnosis present

## 2016-03-13 DIAGNOSIS — R809 Proteinuria, unspecified: Secondary | ICD-10-CM | POA: Diagnosis present

## 2016-03-13 DIAGNOSIS — K920 Hematemesis: Secondary | ICD-10-CM | POA: Diagnosis present

## 2016-03-13 DIAGNOSIS — Z880 Allergy status to penicillin: Secondary | ICD-10-CM | POA: Diagnosis not present

## 2016-03-13 DIAGNOSIS — I288 Other diseases of pulmonary vessels: Secondary | ICD-10-CM | POA: Diagnosis not present

## 2016-03-13 DIAGNOSIS — R319 Hematuria, unspecified: Secondary | ICD-10-CM

## 2016-03-13 DIAGNOSIS — Z885 Allergy status to narcotic agent status: Secondary | ICD-10-CM | POA: Diagnosis not present

## 2016-03-13 DIAGNOSIS — Z96653 Presence of artificial knee joint, bilateral: Secondary | ICD-10-CM | POA: Diagnosis present

## 2016-03-13 DIAGNOSIS — E860 Dehydration: Secondary | ICD-10-CM | POA: Diagnosis present

## 2016-03-13 DIAGNOSIS — F329 Major depressive disorder, single episode, unspecified: Secondary | ICD-10-CM | POA: Diagnosis present

## 2016-03-13 DIAGNOSIS — M545 Low back pain, unspecified: Secondary | ICD-10-CM

## 2016-03-13 DIAGNOSIS — K219 Gastro-esophageal reflux disease without esophagitis: Secondary | ICD-10-CM | POA: Diagnosis present

## 2016-03-13 DIAGNOSIS — G4730 Sleep apnea, unspecified: Secondary | ICD-10-CM | POA: Diagnosis present

## 2016-03-13 DIAGNOSIS — R7689 Other specified abnormal immunological findings in serum: Secondary | ICD-10-CM

## 2016-03-13 DIAGNOSIS — Z7952 Long term (current) use of systemic steroids: Secondary | ICD-10-CM | POA: Diagnosis not present

## 2016-03-13 DIAGNOSIS — G8929 Other chronic pain: Secondary | ICD-10-CM

## 2016-03-13 DIAGNOSIS — E669 Obesity, unspecified: Secondary | ICD-10-CM | POA: Diagnosis present

## 2016-03-13 DIAGNOSIS — Z882 Allergy status to sulfonamides status: Secondary | ICD-10-CM

## 2016-03-13 DIAGNOSIS — R768 Other specified abnormal immunological findings in serum: Secondary | ICD-10-CM

## 2016-03-13 DIAGNOSIS — R739 Hyperglycemia, unspecified: Secondary | ICD-10-CM | POA: Diagnosis present

## 2016-03-13 DIAGNOSIS — D72829 Elevated white blood cell count, unspecified: Secondary | ICD-10-CM | POA: Diagnosis present

## 2016-03-13 DIAGNOSIS — I776 Arteritis, unspecified: Secondary | ICD-10-CM

## 2016-03-13 LAB — CBC WITH DIFFERENTIAL/PLATELET
BASOS ABS: 0.1 10*3/uL (ref 0–0.1)
Basophils Relative: 1 %
Eosinophils Absolute: 0.1 10*3/uL (ref 0–0.7)
Eosinophils Relative: 1 %
HEMATOCRIT: 37.8 % (ref 35.0–47.0)
HEMOGLOBIN: 12.9 g/dL (ref 12.0–16.0)
LYMPHS PCT: 13 %
Lymphs Abs: 1.9 10*3/uL (ref 1.0–3.6)
MCH: 29.9 pg (ref 26.0–34.0)
MCHC: 34.1 g/dL (ref 32.0–36.0)
MCV: 87.6 fL (ref 80.0–100.0)
MONO ABS: 0.8 10*3/uL (ref 0.2–0.9)
MONOS PCT: 6 %
NEUTROS ABS: 11.8 10*3/uL — AB (ref 1.4–6.5)
Neutrophils Relative %: 79 %
Platelets: 324 10*3/uL (ref 150–440)
RBC: 4.31 MIL/uL (ref 3.80–5.20)
RDW: 13.8 % (ref 11.5–14.5)
WBC: 14.7 10*3/uL — ABNORMAL HIGH (ref 3.6–11.0)

## 2016-03-13 LAB — COMPREHENSIVE METABOLIC PANEL
ALK PHOS: 84 U/L (ref 38–126)
ALT: 27 U/L (ref 14–54)
AST: 21 U/L (ref 15–41)
Albumin: 3.2 g/dL — ABNORMAL LOW (ref 3.5–5.0)
Anion gap: 9 (ref 5–15)
BILIRUBIN TOTAL: 0.5 mg/dL (ref 0.3–1.2)
BUN: 25 mg/dL — AB (ref 6–20)
CALCIUM: 8.7 mg/dL — AB (ref 8.9–10.3)
CO2: 29 mmol/L (ref 22–32)
Chloride: 97 mmol/L — ABNORMAL LOW (ref 101–111)
Creatinine, Ser: 0.99 mg/dL (ref 0.44–1.00)
GFR calc Af Amer: >60 mL/min (ref >60)
GLUCOSE: 106 mg/dL — AB (ref 65–99)
POTASSIUM: 3.9 mmol/L (ref 3.5–5.1)
Sodium: 135 mmol/L (ref 135–145)
TOTAL PROTEIN: 7.2 g/dL (ref 6.5–8.1)

## 2016-03-13 LAB — URINALYSIS, COMPLETE (UACMP) WITH MICROSCOPIC
Bacteria, UA: NONE SEEN
Bilirubin Urine: NEGATIVE
GLUCOSE, UA: NEGATIVE mg/dL
KETONES UR: NEGATIVE mg/dL
Leukocytes, UA: NEGATIVE
NITRITE: NEGATIVE
PH: 7 (ref 5.0–8.0)
PROTEIN: 100 mg/dL — AB
RBC / HPF: NONE SEEN RBC/hpf (ref 0–5)
Specific Gravity, Urine: 1.012 (ref 1.005–1.030)

## 2016-03-13 LAB — SEDIMENTATION RATE: SED RATE: 59 mm/h — AB (ref 0–30)

## 2016-03-13 MED ORDER — ACETAMINOPHEN 325 MG PO TABS
650.0000 mg | ORAL_TABLET | Freq: Four times a day (QID) | ORAL | Status: DC | PRN
  Administered 2016-03-18 – 2016-03-19 (×2): 650 mg via ORAL
  Filled 2016-03-13 (×2): qty 2

## 2016-03-13 MED ORDER — GUAIFENESIN 100 MG/5ML PO SOLN: 5.0000 mL | ORAL | Status: DC | PRN

## 2016-03-13 MED ORDER — ONDANSETRON HCL 4 MG PO TABS: 4.0000 mg | ORAL_TABLET | Freq: Four times a day (QID) | ORAL | Status: DC | PRN

## 2016-03-13 MED ORDER — ONDANSETRON HCL 4 MG/2ML IJ SOLN: 4.0000 mg | Freq: Four times a day (QID) | INTRAMUSCULAR | Status: DC | PRN

## 2016-03-13 MED ORDER — METHYLPREDNISOLONE SODIUM SUCC 125 MG IJ SOLR
INTRAMUSCULAR | Status: AC
  Filled 2016-03-13: qty 2

## 2016-03-13 MED ORDER — PANTOPRAZOLE SODIUM 40 MG PO TBEC
40.0000 mg | DELAYED_RELEASE_TABLET | Freq: Every day | ORAL | Status: DC
  Administered 2016-03-15 – 2016-03-19 (×5): 40 mg via ORAL
  Filled 2016-03-13 (×7): qty 1

## 2016-03-13 MED ORDER — HEPARIN SODIUM (PORCINE) 5000 UNIT/ML IJ SOLN
5000.0000 [IU] | Freq: Three times a day (TID) | INTRAMUSCULAR | Status: DC
  Administered 2016-03-13 – 2016-03-14 (×2): 5000 [IU] via SUBCUTANEOUS
  Filled 2016-03-13 (×2): qty 1

## 2016-03-13 MED ORDER — METHYLPREDNISOLONE SODIUM SUCC 40 MG IJ SOLR
40.0000 mg | Freq: Four times a day (QID) | INTRAMUSCULAR | Status: DC
  Administered 2016-03-13 – 2016-03-19 (×23): 40 mg via INTRAVENOUS
  Filled 2016-03-13 (×25): qty 1

## 2016-03-13 MED ORDER — ALBUTEROL SULFATE (2.5 MG/3ML) 0.083% IN NEBU: 2.5000 mg | INHALATION_SOLUTION | RESPIRATORY_TRACT | Status: DC | PRN

## 2016-03-13 MED ORDER — ACETAMINOPHEN 650 MG RE SUPP: 650.0000 mg | Freq: Four times a day (QID) | RECTAL | Status: DC | PRN

## 2016-03-13 MED ORDER — SODIUM CHLORIDE 0.9 % IV SOLN
INTRAVENOUS | Status: DC
  Administered 2016-03-13 – 2016-03-15 (×4): via INTRAVENOUS

## 2016-03-13 NOTE — ED Triage Notes (Signed)
Pt with low back pain and blood in her urine. Low back pain for two weeks.

## 2016-03-13 NOTE — ED Notes (Signed)
Pt reports "vomiting blood" and urine today. Pt states she was dx with vasculitis recently and that she is also experiencing cough. Started on prednisone earlier in week.

## 2016-03-13 NOTE — Consult Note (Signed)
Name: Cassandra MathRhonda L [name] MRN: [id number] DOB: [birth year]    ADMISSION DATE:  03/13/2016 CONSULTATION DATE: 12/28  REFERRING MD :  Shaune PollackQing Chen  CHIEF COMPLAINT: Hematuria  And Hemoptysis  BRIEF PATIENT DESCRIPTION:  Cassandra PoliRhonda  [name] is a 60 year old female with PMH of lung disease, felt to be due to Vasculitis. Not compliant with outpatient steroids due to swelling of the tongue. Admitted to inpatient and ordered I/V solumedrol.  SIGNIFICANT EVENTS  12/28 >> Patient admitted to med-surg floor due hematuria and back pain possibly due to vasculitis. 01/11/16 NM HEPATO  W/ EJECT FRACT>>No scintigraphic evidence of acute cholecystitis or biliary Dyskinesia 10/9 US abdomen>>No scintigraphic evidence of acute cholecystitis or biliary dyskinesia  STUDIES:  12/14 CT Chest high resolution>>Extensive patchy confluent and vaguely nodular peribronchovascular ground-glass opacities throughout both lungswith associated mild patchy subpleural reticulation. This spectrumof findings is most consistent with pulmonary vasculitis given theclinical history, and has significantly worsened since the05/20/2015 high-resolution chest CT study. These findings are not consistent with usual interstitial pneumonia (UIP). 2. Aortic atherosclerosis.  One vessel coronary atherosclerosis. 3. Trace right pleural effusion. 4. Small hiatal hernia.  HISTORY OF PRESENT ILLNESS:   Cassandra [name] is a 60 yo female  With PMH significant for Wegner's disease, depression, GERD, sleep apnea.  The patient had back pain and hematuria for the past two weeks.  She also has persistent cough and has blood tinged cough for the past couple of days.  Dr. Kalman ShanMurali Ramaswamy is her pulmonologist and she states that he was placed her on increase dose of prednisone on 21st of December.  She states that she thinks the "dose was too high and her tongue was sizzling" so she decreased the dose 30 mg and she feels better.     PAST MEDICAL HISTORY :   has a  past medical history of Allergy; Anxiety; Complication of anesthesia; Depression; Dysrhythmia; GERD (gastroesophageal reflux disease); Heart murmur; Leukocytoclastic vasculitis (HCC); Migraines; Sleep apnea; SVT (supraventricular tachycardia) (HCC); and Wegner's disease (congenital syphilitic osteochondritis).  has a past surgical history that includes Tonsillectomy (08/2002); Carpal tunnel release (Right, 2002); Tubal ligation (1980); Total knee arthroplasty (Right); Bunionectomy (Bilateral); Joint replacement (Right); and Total knee arthroplasty (Left, 02/21/2014). Prior to Admission medications   Medication Sig Start Date End Date Taking? Authorizing Provider  esomeprazole (NEXIUM) 40 MG capsule Take 1 capsule (40 mg total) by mouth daily at 12 noon. 05/24/13  Yes Nyoka CowdenMichael B Wert, MD  sertraline (ZOLOFT) 100 MG tablet TAKE 1 TABLET BY MOUTH EVERY NIGHT AT BEDTIME 01/31/14  Yes Donita BrooksWarren T Pickard, MD  diltiazem (CARDIZEM) 30 MG tablet Take 1 tablet (30 mg total) by mouth as needed (for palpitations). Patient not taking: Reported on 03/13/2016 06/17/13   Eber HongBrian Miller, MD  predniSONE (DELTASONE) 20 MG tablet Take 60mg  daily x 2 weeks, then decrease to 40mg  daily until seen in office. Patient not taking: Reported on 03/13/2016 03/06/16   Bevelyn NgoSarah F Groce, NP   Allergies  Allergen Reactions  . Ampicillin     Rash   . Clarithromycin     unknown  . Codeine     Head feels like its crawling  . Effexor [Venlafaxine]     Hives   . Flagyl [Metronidazole]     unknown  . Fluoxetine Hcl Itching    Deep Itch   . Myrbetriq [Mirabegron]     Caused back pai, dry mouth  . Nsaids Other (See Comments)    Hypersensitivity vasculitis  . Paxil [Paroxetine]   .  Reglan [Metoclopramide]     Blister in mouth   . Sulfa Antibiotics     Stomach irritation  . Tetracycline     Severe stomach pain    FAMILY HISTORY:  family history includes Arthritis in her sister; Asthma in her cousin; Breast cancer in her sister;  Cervical cancer in her mother; Clotting disorder (age of onset: 3265) in her mother; Heart attack (age of onset: 5551) in her maternal grandmother. SOCIAL HISTORY:  reports that she has never smoked. She has never used smokeless tobacco. She reports that she does not drink alcohol or use drugs.  REVIEW OF SYSTEMS:   Constitutional: Negative for fever, chills, weight loss, malaise/fatigue and diaphoresis.  HENT: Negative for hearing loss, ear pain, nosebleeds, congestion, sore throat, neck pain, tinnitus and ear discharge.   Eyes: Negative for blurred vision, double vision, photophobia, pain, discharge and redness.  Respiratory: Negative for cough, hemoptysis, sputum production, shortness of breath, wheezing and stridor.   Cardiovascular: Negative for chest pain, palpitations, orthopnea, claudication, leg swelling and PND.  Gastrointestinal: Negative for heartburn, nausea, vomiting, abdominal pain, diarrhea, constipation, blood in stool and melena.  Genitourinary: Negative for dysuria, urgency, frequency, hematuria and flank pain.  Musculoskeletal: Negative for myalgias, back pain, joint pain and falls.  Skin: Negative for itching and rash.  Neurological: Negative for dizziness, tingling, tremors, sensory change, speech change, focal weakness, seizures, loss of consciousness, weakness and headaches.  Endo/Heme/Allergies: Negative for environmental allergies and polydipsia. Does not bruise/bleed easily.  SUBJECTIVE:   VITAL SIGNS: Temp:  [97.6 F (36.4 C)-97.7 F (36.5 C)] 97.7 F (36.5 C) (12/28 2129) Pulse Rate:  [74-87] 82 (12/28 2129) Resp:  [16-20] 20 (12/28 2129) BP: (116-134)/(51-74) 134/72 (12/28 2129) SpO2:  [96 %-99 %] 99 % (12/28 2129) Weight:  [109.3 kg (241 lb)-112.1 kg (247 lb 3 oz)] 112.1 kg (247 lb 3 oz) (12/28 2131)  PHYSICAL EXAMINATION: General:  Well built, well nourished white female in no acute distress. Neuro: Alert and oriented X 3 HEENT:  Atraumatic, normocephalic,  no JVD appreciated Cardiovascular:  S1S2,RRR, no MRG noted Lungs:  Clear bilaterally , no wheezes, crackles, rhonchi noted Abdomen: obese, soft, non tender, active bowel sounds Musculoskeletal:  No inflammation/deformity noted Skin:  Grossly intact   Recent Labs Lab 03/13/16 1837  NA 135  K 3.9  CL 97*  CO2 29  BUN 25*  CREATININE 0.99  GLUCOSE 106*    Recent Labs Lab 03/13/16 1837  HGB 12.9  HCT 37.8  WBC 14.7*  PLT 324   No results found.  ASSESSMENT / PLAN:   ANCA Vasculitis Leukocytosis possibly due to steroid use/reacton Hematuria Cough GERD   Plan Solumedrol 40 mg q6 hours CT chest on 12/14 consistent with pulmonary vasculitus Robitussin PRN Albuterol PRN  Follow CBC Follow up appointment with pulmonologist in 3-4 days Rest per primary      Leroy Trim,AG-ACNP Pulmonary and Critical Care Medicine [facility]   03/13/2016, 9:47 PM

## 2016-03-13 NOTE — ED Provider Notes (Signed)
[facility] Emergency Department Provider Note  ____________________________________________   First MD Initiated Contact with Patient 03/13/16 1828     (approximate)  I have reviewed the triage vital signs and the nursing notes.   HISTORY  Chief Complaint Back Pain and Hematuria    HPI Cassandra [name] is a 60 y.o. female with a complicated medical history that includes leukocytoclastic vasculitis and Wegener's disease who presents for evaluation of gradually worsening low back pain over the last 2 weeks with a persistent cough and the development over the last 2 days of hemoptysis.  She saw her pulmonologist about 2 weeks ago and he started her on prednisone 60 mg 2 weeks to be decreased to 40 mg after 2 weeks and continue on it until she is seen in the office in a couple of more weeks.  However the patient states that after about 1 week on the prednisone she felt like the dose was too high because "my tongue was sizzling" so she decreased to 30 mg and then has not had any today or yesterday.  She has also had some blood in her urine although that also has happened in the past.   The hemoptysis is new for her.  She states that she has had a persistent productive cough for a long time, more than a month, but that just recently she started noticing blood streaked in the sputum.  She showed me a paper towel in which there was blood tinged sputum.  She denies any large volume of blood but is concerned about this new development.   Past Medical History:  Diagnosis Date  . Allergy   . Anxiety   . Complication of anesthesia    difficulty waking up  . Depression   . Dysrhythmia    svt  . GERD (gastroesophageal reflux disease)   . Heart murmur    never seen cardiologist for this  . Leukocytoclastic vasculitis (HCC)    Dr. Marchelle Gearing and Dr. Corliss Skains  . Migraines    maybe one a year  . Sleep apnea    cpap  . SVT (supraventricular tachycardia) (HCC)   .  Wegner's disease (congenital syphilitic osteochondritis)     Patient Active Problem List   Diagnosis Date Noted  . History of gross hematuria 02/20/2016  . Primary osteoarthritis of left knee 02/21/2014  . Obesity 02/21/2014  . Primary osteoarthritis of knee 02/21/2014  . SVT (supraventricular tachycardia) (HCC) 02/17/2014  . Leukocytoclastic vasculitis (HCC)   . 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 Anxiety   . Depression   . GERD (gastroesophageal reflux disease)   . Dyspnea and respiratory abnormality 01/03/2009  . HEPATITIS C 12/07/2008  . VASCULITIS 12/07/2008  . MICROSCOPIC HEMATURIA 12/07/2008  . HEADACHE, CHRONIC 11/30/2008  . COUGH 11/30/2008    Past Surgical History:  Procedure Laterality Date  . BUNIONECTOMY Bilateral   . CARPAL TUNNEL RELEASE Right 2002  . JOINT REPLACEMENT Right    knee  . TONSILLECTOMY  08/2002  . TOTAL KNEE ARTHROPLASTY Right   . TOTAL KNEE ARTHROPLASTY Left 02/21/2014   Procedure: LEFT TOTAL KNEE ARTHROPLASTY;  Surgeon: Velna Ochs, MD;  Location: [facility] OR;  Service: Orthopedics;  Laterality: Left;  . TUBAL LIGATION  1980    Prior to Admission medications   Medication Sig Start Date End Date Taking? Authorizing Provider  calcium citrate (CALCITRATE - DOSED IN MG ELEMENTAL CALCIUM) 950 MG tablet Take 200 mg of elemental calcium by mouth daily.  Historical Provider, MD  cetirizine (ZYRTEC) 10 MG tablet Take 10 mg by mouth daily as needed for allergies.    Historical Provider, MD  diltiazem (CARDIZEM) 30 MG tablet Take 1 tablet (30 mg total) by mouth as needed (for palpitations). Patient not taking: Reported on 03/06/2016 06/17/13   Eber HongBrian Miller, MD  esomeprazole (NEXIUM) 40 MG capsule Take 1 capsule (40 mg total) by mouth daily at 12 noon. 05/24/13   Nyoka CowdenMichael B Wert, MD  potassium chloride (K-DUR,KLOR-CON) 10 MEQ tablet Take 10 mEq by mouth daily.    Historical Provider, MD  predniSONE (DELTASONE) 20 MG tablet Take 60mg  daily x 2  weeks, then decrease to 40mg  daily until seen in office. 03/06/16   Bevelyn NgoSarah F Groce, NP  Probiotic Product (PROBIOTIC DAILY PO) Take 1 tablet by mouth daily.     Historical Provider, MD  sertraline (ZOLOFT) 100 MG tablet TAKE 1 TABLET BY MOUTH EVERY NIGHT AT BEDTIME 01/31/14   Donita BrooksWarren T Pickard, MD    Allergies Ampicillin; Clarithromycin; Codeine; Effexor [venlafaxine]; Flagyl [metronidazole]; Fluoxetine hcl; Myrbetriq [mirabegron]; Nsaids; Paxil [paroxetine]; Reglan [metoclopramide]; Sulfa antibiotics; and Tetracycline  Family History  Problem Relation Age of Onset  . Clotting disorder Mother 465    Blood clot, foot amputation  . Cervical cancer Mother   . Arthritis Sister   . Heart attack Maternal Grandmother 51  . Breast cancer Sister   . Asthma Cousin     Social History Social History  Substance Use Topics  . Smoking status: Never Smoker  . Smokeless tobacco: Never Used  . Alcohol use No    Review of Systems Constitutional: No fever/chills Eyes: No visual changes. ENT: No sore throat. Cardiovascular: Chest pain from her coughing. Respiratory: Persistent productive cough now with blood-tinged sputum.  No difficulty breathing. Gastrointestinal: No abdominal pain.  Occasional nausea and vomiting.  No diarrhea.  No constipation. Genitourinary: Negative for dysuria.  Some blood in her urine Musculoskeletal: Persistent midline and bilateral lower back pain for 2 weeks Skin: Negative for rash. Neurological: Negative for headaches, focal weakness or numbness.  10-point ROS otherwise negative.  ____________________________________________   PHYSICAL EXAM:  VITAL SIGNS: ED Triage Vitals  Enc Vitals Group     BP 03/13/16 1725 (!) 116/51     Pulse Rate 03/13/16 1725 87     Resp 03/13/16 1725 18     Temp 03/13/16 1725 97.6 F (36.4 C)     Temp Source 03/13/16 1725 Oral     SpO2 03/13/16 1725 98 %     Weight 03/13/16 1725 241 lb (109.3 kg)     Height 03/13/16 1725 5\' 3"   (1.6 m)     Head Circumference --      Peak Flow --      Pain Score 03/13/16 1726 8     Pain Loc --      Pain Edu? --      Excl. in GC? --     Constitutional: Alert and oriented. Well appearing and in no acute distress. Eyes: Conjunctivae are normal. PERRL. EOMI. Head: Atraumatic. Nose: No congestion/rhinnorhea. Mouth/Throat: Mucous membranes are moist.  Oropharynx non-erythematous. Neck: No stridor.  No meningeal signs.   Cardiovascular: Normal rate, regular rhythm. Good peripheral circulation. Grossly normal heart sounds. Respiratory: Normal respiratory effort.  No retractions. Lungs CTAB. Gastrointestinal: Soft and nontender. No distention.  Musculoskeletal: No lower extremity tenderness nor edema. No gross deformities of extremities.No reproducible lower back tenderness to palpation and no CVA tenderness. Neurologic:  Normal speech  and language. No gross focal neurologic deficits are appreciated.  Skin:  Skin is warm, dry and intact. No rash noted. Psychiatric: Mood and affect are normal. Speech and behavior are normal.  ____________________________________________   LABS (all labs ordered are listed, but only abnormal results are displayed)  Labs Reviewed  URINALYSIS, COMPLETE (UACMP) WITH MICROSCOPIC - Abnormal; Notable for the following:       Result Value   Color, Urine YELLOW (*)    APPearance CLEAR (*)    Hgb urine dipstick MODERATE (*)    Protein, ur 100 (*)    Squamous Epithelial / LPF 0-5 (*)    All other components within normal limits  CBC WITH DIFFERENTIAL/PLATELET - Abnormal; Notable for the following:    WBC 14.7 (*)    Neutro Abs 11.8 (*)    All other components within normal limits  COMPREHENSIVE METABOLIC PANEL - Abnormal; Notable for the following:    Chloride 97 (*)    Glucose, Bld 106 (*)    BUN 25 (*)    Calcium 8.7 (*)    Albumin 3.2 (*)    All other components within normal limits  SEDIMENTATION RATE - Abnormal; Notable for the following:     Sed Rate 59 (*)    All other components within normal limits   ____________________________________________  EKG  None - EKG not ordered by ED physician ____________________________________________  RADIOLOGY   No results found.  ____________________________________________   PROCEDURES  Procedure(s) performed:   Procedures   Critical Care performed: No ____________________________________________   INITIAL IMPRESSION / ASSESSMENT AND PLAN / ED COURSE  Pertinent labs & imaging results that were available during my care of the patient were reviewed by me and considered in my medical decision making (see chart for details).   Clinical Course as of Mar 13 1937  Thu Mar 13, 2016  [id number] I reviewed the patient's medical records and see a note from 1 week ago from her pulmonology clinic where it was pointed out that she had a high resolution chest CT 2 weeks ago which was consistent with pulmonary vasculitis.  I see the note with the recommended that she started on 60 mg of prednisone daily 2 weeks followed by 40 mg until she follows up with Dr.Ramaswamy.  I have paged [facility] pulmonology to discuss the case and in the meantime and obtaining lab work.  I will defer to their recommendations if additional imaging is needed, if the patient requires admission, etc.  [CF]  1847 Vital signs stable.  [CF]  1932 I spoke by phone with Dr. Sung AmabileSimonds with pulmonary/CC.  We discussed the case extensively and his recommendation, given the new onset hemoptysis (although it is mild) is to admit the patient, start Solu-Medrol 40 mg IV every 6 hours, and obtain a pulmonary consult tomorrow.  He already put in the order for the consult.  I will discuss with the hospitalist.  I updated the patient and her sister.  [CF]    Clinical Course User Index [CF] Loleta Roseory Yunique Dearcos, MD    ____________________________________________  FINAL CLINICAL IMPRESSION(S) / ED DIAGNOSES  Final diagnoses:  Pulmonary  vasculitis (HCC)  Hemoptysis  Chronic bilateral low back pain without sciatica     MEDICATIONS GIVEN DURING THIS VISIT:  Medications  methylPREDNISolone sodium succinate (SOLU-MEDROL) 40 mg/mL injection 40 mg (not administered)     NEW OUTPATIENT MEDICATIONS STARTED DURING THIS VISIT:  New Prescriptions   No medications on file    Modified Medications  No medications on file    Discontinued Medications   BUSPIRONE (BUSPAR) 10 MG TABLET    Take 10 mg by mouth 2 (two) times daily.     Note:  This document was prepared using Dragon voice recognition software and may include unintentional dictation errors.    Loleta Rose, MD 03/13/16 [phone]

## 2016-03-13 NOTE — H&P (Signed)
[employer] [facility] [facility] [place] at [facility]   PATIENT NAME: Cassandra [name]    MR#:  [id number]  DATE OF BIRTH:  [birth year]  DATE OF ADMISSION:  03/13/2016  PRIMARY CARE PHYSICIAN: Illa Level, NP   REQUESTING/REFERRING PHYSICIAN: Loleta Rose, MD  CHIEF COMPLAINT:   Chief Complaint  Patient presents with  . Back Pain  . Hematuria   Hemoptysis 2 days HISTORY OF PRESENT ILLNESS:  Cassandra [name]  is a 60 y.o. female with a known history of Wegner's disease, allergy, anxiety and GERD. The patient has had back pain and hematuria for the past 2 weeks. She also has persistent cough and developed hematemesis for the past 2 days. She is taking prednisone which was prescribed by pulmonary physician. But she states that after about 1 week on the prednisone she felt like the dose was too high because "my tongue was sizzling" so she decreased to 30 mg and then has not had any today or yesterday. She also complains of one episode diarrhea with bloody stool yesterday. Dr. Sung Amabile suggested admitting patient and start IV Solu-Medrol. PAST MEDICAL HISTORY:   Past Medical History:  Diagnosis Date  . Allergy   . Anxiety   . Complication of anesthesia    difficulty waking up  . Depression   . Dysrhythmia    svt  . GERD (gastroesophageal reflux disease)   . Heart murmur    never seen cardiologist for this  . Leukocytoclastic vasculitis (HCC)    Dr. Marchelle Gearing and Dr. Corliss Skains  . Migraines    maybe one a year  . Sleep apnea    cpap  . SVT (supraventricular tachycardia) (HCC)   . Wegner's disease (congenital syphilitic osteochondritis)     PAST SURGICAL HISTORY:   Past Surgical History:  Procedure Laterality Date  . BUNIONECTOMY Bilateral   . CARPAL TUNNEL RELEASE Right 2002  . JOINT REPLACEMENT Right    knee  . TONSILLECTOMY  08/2002  . TOTAL KNEE ARTHROPLASTY Right   . TOTAL KNEE ARTHROPLASTY Left 02/21/2014   Procedure: LEFT TOTAL KNEE ARTHROPLASTY;  Surgeon: Velna Ochs, MD;  Location: [facility] OR;  Service: Orthopedics;  Laterality: Left;  . TUBAL LIGATION  1980    SOCIAL HISTORY:   Social History  Substance Use Topics  . Smoking status: Never Smoker  . Smokeless tobacco: Never Used  . Alcohol use No    FAMILY HISTORY:   Family History  Problem Relation Age of Onset  . Clotting disorder Mother 17    Blood clot, foot amputation  . Cervical cancer Mother   . Arthritis Sister   . Heart attack Maternal Grandmother 51  . Breast cancer Sister   . Asthma Cousin     DRUG ALLERGIES:   Allergies  Allergen Reactions  . Ampicillin     Rash   . Clarithromycin     unknown  . Codeine     Head feels like its crawling  . Effexor [Venlafaxine]     Hives   . Flagyl [Metronidazole]     unknown  . Fluoxetine Hcl Itching    Deep Itch   . Myrbetriq [Mirabegron]     Caused back pai, dry mouth  . Nsaids Other (See Comments)    Hypersensitivity vasculitis  . Paxil [Paroxetine]   . Reglan [Metoclopramide]     Blister in mouth   . Sulfa Antibiotics     Stomach irritation  . Tetracycline     Severe stomach pain  REVIEW OF SYSTEMS:   Review of Systems  Constitutional: Negative for chills, fever, malaise/fatigue and weight loss.  HENT: Positive for hearing loss. Negative for ear discharge, ear pain, nosebleeds and sinus pain.   Eyes: Negative for blurred vision and double vision.  Respiratory: Positive for cough, hemoptysis, sputum production and shortness of breath. Negative for wheezing and stridor.   Cardiovascular: Negative for chest pain, orthopnea and leg swelling.  Gastrointestinal: Positive for blood in stool. Negative for abdominal pain, diarrhea, melena, nausea and vomiting.  Genitourinary: Positive for hematuria. Negative for dysuria, flank pain, frequency and urgency.  Musculoskeletal: Positive for back pain. Negative for joint pain.  Skin: Negative for itching and rash.  Neurological: Negative for dizziness, focal weakness  and loss of consciousness.  Psychiatric/Behavioral: Negative for depression. The patient is not nervous/anxious.     MEDICATIONS AT HOME:   Prior to Admission medications   Medication Sig Start Date End Date Taking? Authorizing Provider  esomeprazole (NEXIUM) 40 MG capsule Take 1 capsule (40 mg total) by mouth daily at 12 noon. 05/24/13  Yes Nyoka CowdenMichael B Wert, MD  sertraline (ZOLOFT) 100 MG tablet TAKE 1 TABLET BY MOUTH EVERY NIGHT AT BEDTIME 01/31/14  Yes Donita BrooksWarren T Pickard, MD  diltiazem (CARDIZEM) 30 MG tablet Take 1 tablet (30 mg total) by mouth as needed (for palpitations). Patient not taking: Reported on 03/13/2016 06/17/13   Eber HongBrian Miller, MD  predniSONE (DELTASONE) 20 MG tablet Take 60mg  daily x 2 weeks, then decrease to 40mg  daily until seen in office. Patient not taking: Reported on 03/13/2016 03/06/16   Bevelyn NgoSarah F Groce, NP      VITAL SIGNS:  Blood pressure 125/74, pulse 74, temperature 97.6 F (36.4 C), temperature source Oral, resp. rate 16, height 5\' 3"  (1.6 m), weight 241 lb (109.3 kg), SpO2 96 %.  PHYSICAL EXAMINATION:  Physical Exam  GENERAL:  60 y.o.-year-old patient lying in the bed with no acute distress. Obese. EYES: Pupils equal, round, reactive to light and accommodation. No scleral icterus. Extraocular muscles intact.  HEENT: Head atraumatic, normocephalic. Oropharynx and nasopharynx clear. Moist oral mucosa. NECK:  Supple, no jugular venous distention. No thyroid enlargement, no tenderness.  LUNGS: Normal breath sounds bilaterally, no wheezing, rales,rhonchi or crepitation. No use of accessory muscles of respiration.  CARDIOVASCULAR: S1, S2 normal. No murmurs, rubs, or gallops.  ABDOMEN: Soft, nontender, nondistended. Bowel sounds present. No organomegaly or mass.  EXTREMITIES: No pedal edema, cyanosis, or clubbing.  NEUROLOGIC: Cranial nerves II through XII are intact. Muscle strength 5/5 in all extremities. Sensation intact. Gait not checked.  PSYCHIATRIC: The patient  is alert and oriented x 3.  SKIN: No obvious rash, lesion, or ulcer.   LABORATORY PANEL:   CBC  Recent Labs Lab 03/13/16 1837  WBC 14.7*  HGB 12.9  HCT 37.8  PLT 324   ------------------------------------------------------------------------------------------------------------------  Chemistries   Recent Labs Lab 03/13/16 1837  NA 135  K 3.9  CL 97*  CO2 29  GLUCOSE 106*  BUN 25*  CREATININE 0.99  CALCIUM 8.7*  AST 21  ALT 27  ALKPHOS 84  BILITOT 0.5   ------------------------------------------------------------------------------------------------------------------  Cardiac Enzymes No results for input(s): TROPONINI in the last 168 hours. ------------------------------------------------------------------------------------------------------------------  RADIOLOGY:  No results found.    IMPRESSION AND PLAN:   Pulmonary vasculitis with hematemesis, hematuria and bloody stool. The patient will be admitted to medical floor. Continue IV Solu-Medrol every 6 hours, NEB when necessary, follow-up pulmonary consult.  Leukocytosis. Possible due to reaction or steroid. Follow-up  CBC. Dehydration. Start IV fluid support and follow-up BMP.  All the records are reviewed and case discussed with ED provider. Management plans discussed with the patient,Her sister and they are in agreement.  CODE STATUS: Full code  TOTAL TIME TAKING CARE OF THIS PATIENT: 52 minutes.    Shaune Pollackhen, Leroy Pettway M.D on 03/13/2016 at 8:54 PM  Between 7am to 6pm - Pager - [phone]  After 6pm go to [email or web address] - Social research officer, governmentpassword EPAS [email or web address]  [facility] [place] [facility]  Office  [phone]  CC: Primary care physician; Illa LevelORNETTO, LINDSEY N, NP   Note: This dictation was prepared with Dragon dictation along with smaller phrase technology. Any transcriptional errors that result from this process are unintentional.

## 2016-03-14 ENCOUNTER — Inpatient Hospital Stay: Payer: Medicare HMO

## 2016-03-14 LAB — BASIC METABOLIC PANEL
ANION GAP: 5 (ref 5–15)
BUN: 24 mg/dL — ABNORMAL HIGH (ref 6–20)
CALCIUM: 8.6 mg/dL — AB (ref 8.9–10.3)
CO2: 28 mmol/L (ref 22–32)
Chloride: 106 mmol/L (ref 101–111)
Creatinine, Ser: 0.93 mg/dL (ref 0.44–1.00)
Glucose, Bld: 163 mg/dL — ABNORMAL HIGH (ref 65–99)
POTASSIUM: 4.7 mmol/L (ref 3.5–5.1)
SODIUM: 139 mmol/L (ref 135–145)

## 2016-03-14 LAB — CBC
HEMATOCRIT: 34.9 % — AB (ref 35.0–47.0)
HEMOGLOBIN: 12 g/dL (ref 12.0–16.0)
MCH: 30 pg (ref 26.0–34.0)
MCHC: 34.5 g/dL (ref 32.0–36.0)
MCV: 87 fL (ref 80.0–100.0)
Platelets: 289 10*3/uL (ref 150–440)
RBC: 4.01 MIL/uL (ref 3.80–5.20)
RDW: 13.8 % (ref 11.5–14.5)
WBC: 12.3 10*3/uL — AB (ref 3.6–11.0)

## 2016-03-14 LAB — PROTEIN / CREATININE RATIO, URINE
Creatinine, Urine: 62 mg/dL
PROTEIN CREATININE RATIO: 0.74 mg/mg{creat} — AB (ref 0.00–0.15)
Total Protein, Urine: 46 mg/dL

## 2016-03-14 NOTE — Progress Notes (Signed)
CH responded to an OR for an AD in room 124. Pt was awake and alert. Family member was bedside. CH educated the Pt on the AD. Pt wanted to look it over before completion. CH is available for follow up as needed.    03/14/16 0900  Clinical Encounter Type  Visited With Patient;Patient and family together  Visit Type Initial;Spiritual support  Referral From Nurse  Spiritual Encounters  Spiritual Needs Literature;Prayer

## 2016-03-14 NOTE — Progress Notes (Signed)
[employer] - [place] at [facility]   PATIENT NAME: Cassandra [name]    MR#:  [id number]  DATE OF BIRTH:  09-Jun-1955  SUBJECTIVE:  CHIEF COMPLAINT:   Chief Complaint  Patient presents with  . Back Pain  . Hematuria  feels about same, still having hemoptysis REVIEW OF SYSTEMS:  Review of Systems  Constitutional: Positive for malaise/fatigue. Negative for chills, fever and weight loss.  HENT: Negative for nosebleeds and sore throat.   Eyes: Negative for blurred vision.  Respiratory: Positive for hemoptysis. Negative for cough, shortness of breath and wheezing.   Cardiovascular: Negative for chest pain, orthopnea, leg swelling and PND.  Gastrointestinal: Negative for abdominal pain, constipation, diarrhea, heartburn, nausea and vomiting.  Genitourinary: Positive for hematuria. Negative for dysuria and urgency.  Musculoskeletal: Negative for back pain.  Skin: Negative for rash.  Neurological: Positive for weakness. Negative for dizziness, speech change, focal weakness and headaches.  Endo/Heme/Allergies: Does not bruise/bleed easily.  Psychiatric/Behavioral: Negative for depression.   DRUG ALLERGIES:   Allergies  Allergen Reactions  . Ampicillin     Rash   . Clarithromycin     unknown  . Codeine     Head feels like its crawling  . Effexor [Venlafaxine]     Hives   . Flagyl [Metronidazole]     unknown  . Fluoxetine Hcl Itching    Deep Itch   . Myrbetriq [Mirabegron]     Caused back pai, dry mouth  . Nsaids Other (See Comments)    Hypersensitivity vasculitis  . Paxil [Paroxetine]   . Reglan [Metoclopramide]     Blister in mouth   . Sulfa Antibiotics     Stomach irritation  . Tetracycline     Severe stomach pain   VITALS:  Blood pressure (!) 149/70, pulse (!) 54, temperature 97.9 F (36.6 C), temperature source Oral, resp. rate 20, height 5\' 3"  (1.6 m), weight 112.1 kg (247 lb 3 oz), SpO2 96 %. PHYSICAL EXAMINATION:  Physical Exam    Constitutional: She is oriented to person, place, and time and well-developed, well-nourished, and in no distress.  HENT:  Head: Normocephalic and atraumatic.  Eyes: Conjunctivae and EOM are normal. Pupils are equal, round, and reactive to light.  Neck: Normal range of motion. Neck supple. No tracheal deviation present. No thyromegaly present.  Cardiovascular: Normal rate, regular rhythm and normal heart sounds.   Pulmonary/Chest: Effort normal and breath sounds normal. No respiratory distress. She has no wheezes. She exhibits no tenderness.  Abdominal: Soft. Bowel sounds are normal. She exhibits no distension. There is no tenderness.  Musculoskeletal: Normal range of motion.  Neurological: She is alert and oriented to person, place, and time. No cranial nerve deficit.  Skin: Skin is warm and dry. No rash noted.  Psychiatric: Mood and affect normal.   LABORATORY PANEL:   CBC  Recent Labs Lab 03/14/16 0542  WBC 12.3*  HGB 12.0  HCT 34.9*  PLT 289   ------------------------------------------------------------------------------------------------------------------ Chemistries   Recent Labs Lab 03/13/16 1837 03/14/16 0542  NA 135 139  K 3.9 4.7  CL 97* 106  CO2 29 28  GLUCOSE 106* 163*  BUN 25* 24*  CREATININE 0.99 0.93  CALCIUM 8.7* 8.6*  AST 21  --   ALT 27  --   ALKPHOS 84  --   BILITOT 0.5  --    RADIOLOGY:  No results found. ASSESSMENT AND PLAN:  Cassandra PoliRhonda  [name] is a 60 year old female with k/h/o Wegner's disease,  depression, GERD, sleep apnea. Not compliant with outpatient steroids due to swelling of the tongue. Admitted to inpatient and ordered I/V solumedrol.  * Pulmonary ANCA vasculitis: with hematemesis, hematuria and bloody stool. - reports continued hemoptysis/hemetemsis. - Continue IV Solu-Medrol every 6 hours, NEB when necessary - Pulmo following - continue robitussion prn for cough   * Leukocytosis. Possible due to reaction or steroid. Monitor  CBC.  * Dehydration: continue IV fluid support and monitor BMP.  * GERD: continue protonix     All the records are reviewed and case discussed with Care Management/Social Worker. Management plans discussed with the patient, family and they are in agreement.  CODE STATUS: FULL CODE  TOTAL TIME TAKING CARE OF THIS PATIENT: 35 minutes.   More than 50% of the time was spent in counseling/coordination of care: YES  POSSIBLE D/C IN 1-2 DAYS, DEPENDING ON CLINICAL CONDITION. And pulmo eval    Delfino LovettVipul Xyon Lukasik M.D on 03/14/2016 at 8:36 AM  Between 7am to 6pm - Pager - [phone]  After 6pm go to [email or web address] - Social research officer, governmentpassword EPAS [email or web address]  [facility] [place] [facility]  Office  [phone]  CC: Primary care physician; Illa LevelORNETTO, LINDSEY N, NP  Note: This dictation was prepared with Dragon dictation along with smaller phrase technology. Any transcriptional errors that result from this process are unintentional.

## 2016-03-14 NOTE — Consult Note (Signed)
Reason for Consult: Pulmonary vasculitis  Referring Physician: Dr. Lorretta [name]  Cassandra [name]   HPI: 60 year old white female. Complicated story She had an illness in 2011. Some shortness of breath. Abnormal chest x-ray. 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 No sinus disease. No renal disease By her report she took 20 mg of prednisone never high doses. Improved. Intermittent follow-up for the next few years. Did see rheumatology in [place]. Had injectable methotrexate every other week and was told she was in remission. Came off of that when she lost insurance. The last 2 years she has been off and on prednisone. Has seen pulmonary. ANCA +1-160 with elevated MPO greater than 100. Recent chest CT shows UIP but also patchy infiltrates. Positive ANA titer 03-1278 but negative anti-DNA.  This summer she had flank pain. Some gross hematuria. Never was diagnosed with kidney stone. One month ago she started having some rash in her leg. Itching. Also had some low back pain. Worse when she coughed.  Recent wheezing, cough and shortness of breath with some hemoptysis and sweats. Sedimentation rate was 88. Started back on prednisone. Felt like she was on a high dose and that her tongue was covered. Sedimentation rate came down to 59. Breathing improved. Wheezing resolved. No gross hematuria but microscopic hematuria as well as proteinuria. Last creatinine normal at 0.9. Occasionally gets pain in left shoulder and hands  Not short of breath. Not on oxygen  PMH: Osteoarthritis. Sleep apnea.  SURGICAL HISTORY: Bilateral knee replacements  Family History: Negative for the connective tissue disease  Social History: No cigarettes or alcohol  Allergies:  Allergies  Allergen Reactions  . Ampicillin     Rash   . Clarithromycin     unknown  . Codeine     Head feels like its crawling  . Effexor [Venlafaxine]     Hives   . Flagyl  [Metronidazole]     unknown  . Fluoxetine Hcl Itching    Deep Itch   . Myrbetriq [Mirabegron]     Caused back pai, dry mouth  . Nsaids Other (See Comments)    Hypersensitivity vasculitis  . Paxil [Paroxetine]   . Reglan [Metoclopramide]     Blister in mouth   . Sulfa Antibiotics     Stomach irritation  . Tetracycline     Severe stomach pain    Medications:  Scheduled: . methylPREDNISolone (SOLU-MEDROL) injection  40 mg Intravenous Q6H  . pantoprazole  40 mg Oral Daily        ROS:No recent abdominal pain. No diarrhea. No numbness or tingling. No Raynaud's. Recent oral ulcer positive.No Alopecia.   PHYSICAL EXAM: Blood pressure 136/66, pulse 70, temperature 97.5 F (36.4 C), temperature source Oral, resp. rate 20, height 5\' 3"  (1.6 m), weight 112.1 kg (247 lb 3 oz), SpO2 96 %. Pleasant female. No acute distress. Several small nonblanching petechiae or macules in her lower extremities. None on her hands or her trunk. Sclera clear. Oropharynx is clear. Clear chest. No loud P2. No significant murmur. No visceromegaly. Nontender abdomen. Muscle skeletal: No significant synovitis wrists MCPs. Mild painful arc of left shoulder. Hips move well no knee effusions Neurologic; moves all extremities. 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 Never had biopsy. Course atypical in that she usually gets a low-dose prednisone taper with resolution. Now with recurrent hemoptysis  and skin rash but with new proteinuria and hematuria  Back pain. Cannot rule out compression fracture given her chronic steroids  Recent gross hematuria  Obesity  Sleep apnea  Recommendations: If she were sicker, the best course would be to go ahead and treat her as if she had a flare of microscopic polyangiitis with rituximab or Cytoxan.  However for course is been somewhat atypical and she's not currently toxic so it may be best to restage her. This seems to be what her current pulmonologist had wanted to do. Therefore recommend urine protein to creatinine ratio, 24-hour urine for protein and nephrology opinion.. Does she need renal biopsy?  Does she need bronchoscopy or lung biopsy for tissue diagnosis? Consider discussing with her current rheumatologist in [place].  A skin biopsy of her lesions to rule out leukocytoclastic vasculitis does not help us make a decision about the need for other immunosuppressive drugs  Continue IV steroids until a decision is made about adding a cytotoxic agent  Lumbar spine films. Rule out compression fracture  I put copy of current recommendations for GPA/MPA remission induction in her paper chart    Cassandra [name] 03/14/2016, 3:51 PM

## 2016-03-14 NOTE — Progress Notes (Signed)
Initial Nutrition Assessment  DOCUMENTATION CODES:   Obesity unspecified  INTERVENTION:  1. Monitor PO intake 2. Pt declined ONS at this time  NUTRITION DIAGNOSIS:   Inadequate oral intake related to poor appetite as evidenced by per patient/family report.  GOAL:   Patient will meet greater than or equal to 90% of their needs  MONITOR:   PO intake, I & O's, Labs, Weight trends, Skin  REASON FOR ASSESSMENT:   Malnutrition Screening Tool    ASSESSMENT:   Cassandra [name]  is a 60 y.o. female with a known history of Wegner's disease, allergy, anxiety and GERD. The patient has had back pain and hematuria for the past 2 weeks. She also has persistent cough and developed hematemesis for the past 2 days  Spoke with [name] at bedside She ate 100% of her breakfast this morning. Has some nausea, no vomiting Denies any issues chewing/swallowing Reports since she has been sick - only eating 1 meal per day, but normally does 2-3 meals per day, mainly consisting of "low carb" due to her sister being a diabetic.  She reports having lost 10# over 1 month related to her Wegner's Disease. Per chart, exhibits a 13#/5% insignificant wt loss over 5.5 months. Does exhibit a 5# wt gain over 1 week - and exhibits some fluid accumulation in lower extremities.  Nutrition-Focused physical exam completed. Findings are no fat depletion, no muscle depletion, and mild edema.   Labs and medications reviewed: Solumedrol NS @ 15600mL/hr  Diet Order:  Diet Heart Room service appropriate? Yes; Fluid consistency: Thin  Skin:  Reviewed, no issues  Last BM:  03/13/2016  Height:   Ht Readings from Last 1 Encounters:  03/13/16 5\' 3"  (1.6 m)    Weight:   Wt Readings from Last 1 Encounters:  03/13/16 247 lb 3 oz (112.1 kg)    Ideal Body Weight:  52.27 kg  BMI:  Body mass index is 43.79 kg/m.  Estimated Nutritional Needs:   Kcal:  1900-2000 calories  Protein:  112-135 gm  Fluid:  >/=  1.9L  EDUCATION NEEDS:   No education needs identified at this time  Cassandra AnoWilliam M. Rakayla Ricklefs, MS, RD LDN Inpatient Clinical Dietitian Pager [phone]

## 2016-03-15 DIAGNOSIS — R7689 Other specified abnormal immunological findings in serum: Secondary | ICD-10-CM

## 2016-03-15 DIAGNOSIS — R768 Other specified abnormal immunological findings in serum: Secondary | ICD-10-CM

## 2016-03-15 LAB — PROTEIN, URINE, 24 HOUR
COLLECTION INTERVAL-UPROT: 24 h
PROTEIN, URINE: 34 mg/dL
Protein, 24H Urine: 1343 mg/d — ABNORMAL HIGH (ref 50–100)
Urine Total Volume-UPROT: 3950 mL

## 2016-03-15 LAB — CBC
HCT: 33.9 % — ABNORMAL LOW (ref 35.0–47.0)
HEMOGLOBIN: 11.6 g/dL — AB (ref 12.0–16.0)
MCH: 30.2 pg (ref 26.0–34.0)
MCHC: 34.2 g/dL (ref 32.0–36.0)
MCV: 88.4 fL (ref 80.0–100.0)
Platelets: 274 10*3/uL (ref 150–440)
RBC: 3.83 MIL/uL (ref 3.80–5.20)
RDW: 14.1 % (ref 11.5–14.5)
WBC: 18.9 10*3/uL — ABNORMAL HIGH (ref 3.6–11.0)

## 2016-03-15 LAB — HEPATITIS B SURFACE ANTIGEN: Hepatitis B Surface Ag: NEGATIVE

## 2016-03-15 LAB — BASIC METABOLIC PANEL
Anion gap: 5 (ref 5–15)
BUN: 25 mg/dL — ABNORMAL HIGH (ref 6–20)
CHLORIDE: 108 mmol/L (ref 101–111)
CO2: 25 mmol/L (ref 22–32)
Calcium: 8.8 mg/dL — ABNORMAL LOW (ref 8.9–10.3)
Creatinine, Ser: 0.91 mg/dL (ref 0.44–1.00)
GFR calc non Af Amer: >60 mL/min (ref >60)
Glucose, Bld: 196 mg/dL — ABNORMAL HIGH (ref 65–99)
POTASSIUM: 4.5 mmol/L (ref 3.5–5.1)
SODIUM: 138 mmol/L (ref 135–145)

## 2016-03-15 LAB — HEPATITIS B SURFACE ANTIBODY,QUALITATIVE: Hep B S Ab: NONREACTIVE

## 2016-03-15 LAB — C3 COMPLEMENT: C3 Complement: 158 mg/dL (ref 82–167)

## 2016-03-15 LAB — C4 COMPLEMENT: COMPLEMENT C4, BODY FLUID: 21 mg/dL (ref 14–44)

## 2016-03-15 LAB — HEPATITIS B CORE ANTIBODY, IGM: Hep B C IgM: NEGATIVE

## 2016-03-15 LAB — ANA W/REFLEX: ANA: NEGATIVE

## 2016-03-15 LAB — HEPATITIS C ANTIBODY: HCV AB: 7.3 {s_co_ratio} — AB (ref 0.0–0.9)

## 2016-03-15 LAB — PROTIME-INR
INR: 1
PROTHROMBIN TIME: 13.2 s (ref 11.4–15.2)

## 2016-03-15 LAB — APTT: APTT: 26 s (ref 24–36)

## 2016-03-15 MED ORDER — SODIUM CHLORIDE 0.9% FLUSH
3.0000 mL | INTRAVENOUS | Status: DC | PRN
  Administered 2016-03-15 – 2016-03-16 (×2): 3 mL via INTRAVENOUS
  Filled 2016-03-15 (×2): qty 3

## 2016-03-15 MED ORDER — CYCLOPHOSPHAMIDE 25 MG PO TABS
150.0000 mg | ORAL_TABLET | Freq: Every day | ORAL | Status: DC
  Administered 2016-03-15 – 2016-03-19 (×5): 150 mg via ORAL
  Filled 2016-03-15 (×3): qty 6
  Filled 2016-03-15: qty 3
  Filled 2016-03-15: qty 6

## 2016-03-15 MED ORDER — SODIUM CHLORIDE 0.9% FLUSH
3.0000 mL | Freq: Two times a day (BID) | INTRAVENOUS | Status: DC
  Administered 2016-03-15 – 2016-03-19 (×9): 3 mL via INTRAVENOUS

## 2016-03-15 NOTE — Progress Notes (Addendum)
[facility] - [place] [facility]   PATIENT NAME: Cassandra [name] Copher    MR#:  [id number]  DATE OF BIRTH:  1956-03-07  SUBJECTIVE:  CHIEF COMPLAINT:   Chief Complaint  Patient presents with  . Back Pain  . Hematuria  Hemoptysis better but still see some. Denies any blood in her urine. Rash improved REVIEW OF SYSTEMS:  Review of Systems  Constitutional: Positive for malaise/fatigue. Negative for chills, fever and weight loss.  HENT: Negative for nosebleeds and sore throat.   Eyes: Negative for blurred vision.  Respiratory: Positive for hemoptysis. Negative for cough, shortness of breath and wheezing.   Cardiovascular: Negative for chest pain, orthopnea, leg swelling and PND.  Gastrointestinal: Negative for abdominal pain, constipation, diarrhea, heartburn, nausea and vomiting.  Genitourinary: Negative for dysuria, hematuria and urgency.  Musculoskeletal: Negative for back pain.  Skin: Negative for rash.  Neurological: Negative for dizziness, speech change, focal weakness, weakness and headaches.  Endo/Heme/Allergies: Does not bruise/bleed easily.  Psychiatric/Behavioral: Negative for depression.   DRUG ALLERGIES:   Allergies  Allergen Reactions  . Ampicillin     Rash   . Clarithromycin     unknown  . Codeine     Head feels like its crawling  . Effexor [Venlafaxine]     Hives   . Flagyl [Metronidazole]     unknown  . Fluoxetine Hcl Itching    Deep Itch   . Myrbetriq [Mirabegron]     Caused back pai, dry mouth  . Nsaids Other (See Comments)    Hypersensitivity vasculitis  . Paxil [Paroxetine]   . Reglan [Metoclopramide]     Blister in mouth   . Sulfa Antibiotics     Stomach irritation  . Tetracycline     Severe stomach pain   VITALS:  Blood pressure (!) 144/68, pulse (!) 55, temperature 98.6 F (37 C), temperature source Oral, resp. rate (!) 24, height 5\' 3"  (1.6 m), weight 112.1 kg (247 lb 3 oz), SpO2 99 %. PHYSICAL EXAMINATION:  Physical Exam   Constitutional: She is oriented to person, place, and time and well-developed, well-nourished, and in no distress.  HENT:  Head: Normocephalic and atraumatic.  Eyes: Conjunctivae and EOM are normal. Pupils are equal, round, and reactive to light.  Neck: Normal range of motion. Neck supple. No tracheal deviation present. No thyromegaly present.  Cardiovascular: Normal rate, regular rhythm and normal heart sounds.   Pulmonary/Chest: Effort normal and breath sounds normal. No respiratory distress. She has no wheezes. She exhibits no tenderness.  Abdominal: Soft. Bowel sounds are normal. She exhibits no distension. There is no tenderness.  Musculoskeletal: Normal range of motion.  Neurological: She is alert and oriented to person, place, and time. No cranial nerve deficit.  Skin: Skin is warm and dry. No rash noted.  Psychiatric: Mood and affect normal.   LABORATORY PANEL:   CBC  Recent Labs Lab 03/15/16 0559  WBC 18.9*  HGB 11.6*  HCT 33.9*  PLT 274   ------------------------------------------------------------------------------------------------------------------ Chemistries   Recent Labs Lab 03/13/16 1837  03/15/16 0559  NA 135  < > 138  K 3.9  < > 4.5  CL 97*  < > 108  CO2 29  < > 25  GLUCOSE 106*  < > 196*  BUN 25*  < > 25*  CREATININE 0.99  < > 0.91  CALCIUM 8.7*  < > 8.8*  AST 21  --   --   ALT 27  --   --  ALKPHOS 84  --   --   BILITOT 0.5  --   --   < > = values in this interval not displayed. RADIOLOGY:  Dg Lumbar Spine 2-3 Views  Result Date: 03/14/2016 CLINICAL DATA:  Two week history of non ring back pain without known injury. EXAM: LUMBAR SPINE - 2-3 VIEW COMPARISON:  None. FINDINGS: Two views study shows no fracture or evidence of subluxation. There is convex rightward thoracolumbar scoliosis. Degenerative facet disease is noted in the lower lumbar spine with trace spondylolisthesis at L5-S1. SI joints are unremarkable. IMPRESSION: Mild degenerative  changes lower lumbar spine. Electronically Signed   By: Kennith CenterEric  Mansell M.D.   On: 03/14/2016 18:02   [place] Renal  Result Date: 03/14/2016 CLINICAL DATA:  Hematuria.  Right flank pain for 2-3 weeks. EXAM: RENAL / URINARY TRACT ULTRASOUND COMPLETE COMPARISON:  12/24/2015 FINDINGS: Right Kidney: Length: 10.9 cm. Echogenicity within normal limits. No mass or hydronephrosis visualized. Left Kidney: Length: 10.6 cm. Echogenicity within normal limits. No mass or hydronephrosis visualized. Bladder: Appears normal for degree of bladder distention. IMPRESSION: Normal exam. Electronically Signed   By: Norva PavlovElizabeth  Brown M.D.   On: 03/14/2016 17:58   ASSESSMENT AND PLAN:  Cassandra [name]  Kujala is a 60 year old female with k/h/o Wegner's disease, depression, GERD, sleep apnea. Not compliant with outpatient steroids due to swelling of the tongue. Admitted to inpatient and ordered I/V solumedrol.  * Pulmonary ANCA vasculitis: with Hemoptysis, hematuria and rash - reports continued hemoptysis, but resolved hematuria and rash - Continue IV Solu-Medrol every 6 hours, NEB when necessary -Nephrology is considering renal biopsy on January 2 by Dr. Thedore MinsSingh, Cytoxan by mouth initiated. Appreciate nephrology and rheumatology recommendations - Pulmo following - continue robitussion prn for cough  -Hepatitis B surface antigen negative -Hep C antibody 7.3  * Leukocytosis. The wbc 12.3-18.9 Possible due to reaction or steroid. Monitor CBC.  * Dehydration: Improved with IV fluids and monitor BMP.  * GERD: continue protonix     All the records are reviewed and case discussed with Care Management/Social Worker. Management plans discussed with the patient, family and they are in agreement.  CODE STATUS: FULL CODE  TOTAL TIME TAKING CARE OF THIS PATIENT: 36 minutes.   More than 50% of the time was spent in counseling/coordination of care: YES  POSSIBLE D/C IN 1-2 DAYS, DEPENDING ON CLINICAL CONDITION. And pulmo eval     Ramonita LabGouru, Hermes Wafer M.D on 03/15/2016 at 1:12 PM  Between 7am to 6pm - Pager - [phone]  After 6pm go to [email or web address] - Social research officer, governmentpassword EPAS [facility]  [facility]  Office  [phone]  CC: Primary care physician; Illa LevelORNETTO, LINDSEY N, NP  Note: This dictation was prepared with Dragon dictation along with smaller phrase technology. Any transcriptional errors that result from this process are unintentional.

## 2016-03-15 NOTE — Progress Notes (Signed)
CH responded to OR for an AD for a Pt in Rm 124A. Pt was ready to complete the Ad. CH went over the AD with the Pt and then completed it.   03/15/16 1200  Clinical Encounter Type  Visited With Patient  Visit Type Initial;Follow-up;ED  Referral From Nurse  Consult/Referral To Chaplain  Spiritual Encounters  Spiritual Needs Other (Comment)

## 2016-03-15 NOTE — Progress Notes (Deleted)
Telemetry called RN reporting pulse ox in 80's. RN found 02 cannula lying in bed with pt stating she forgot to put it back on. No acute distress. 02 reapplied with pulse returning to normal. Pt education done to leave 02 on or ask for assistance.

## 2016-03-15 NOTE — Consult Note (Signed)
[facility]  CONSULT NOTE    Date: 03/15/2016                  Patient Name:  Cassandra [name]  MRN: [id number]  DOB: 1955/09/19  Age / Sex: 60 y.o., female         PCP: Illa Level, NP                 Service Requesting Consult: Dr. Gavin Potters                 Reason for Consult: Hematuria            History of Present Illness: Cassandra [name] is a 60 y.o. white female with sleep apnea, hypertension, GERD, depression, anxiety, who was admitted to [facility] on 03/13/2016 for Pulmonary vasculitis (HCC) [I28.8] Hemoptysis [R04.2] Chronic bilateral low back pain without sciatica [M54.5, G89.29]   Patient states in 2009-11 she was worked up for vasculitis where she was treated with steroids and methotrexate? Denies ever being on cytoxan, rituximab, mycophenolate, azothioprine or tacrolimus/cyclosporin.   She states she was told she was being treated for Wegener's and/or Lupus. Reports never getting a tissue biopsy.   Medications: Outpatient medications: Prescriptions Prior to Admission  Medication Sig Dispense Refill Last Dose  . esomeprazole (NEXIUM) 40 MG capsule Take 1 capsule (40 mg total) by mouth daily at 12 noon. 30 capsule 2 prn at prn  . sertraline (ZOLOFT) 100 MG tablet TAKE 1 TABLET BY MOUTH EVERY NIGHT AT BEDTIME 90 tablet 3 03/12/2016 at pm  . diltiazem (CARDIZEM) 30 MG tablet Take 1 tablet (30 mg total) by mouth as needed (for palpitations). (Patient not taking: Reported on 03/13/2016) 30 tablet 1 Not Taking at Unknown time  . predniSONE (DELTASONE) 20 MG tablet Take 60mg  daily x 2 weeks, then decrease to 40mg  daily until seen in office. (Patient not taking: Reported on 03/13/2016) 100 tablet 0 Not Taking at Unknown time    Current medications: Current Facility-Administered Medications  Medication Dose Route Frequency Provider Last Rate Last Dose  . acetaminophen (TYLENOL) tablet 650 mg  650 mg Oral Q6H PRN Shaune Pollack, MD       Or  .  acetaminophen (TYLENOL) suppository 650 mg  650 mg Rectal Q6H PRN Shaune Pollack, MD      . albuterol (PROVENTIL) (2.5 MG/3ML) 0.083% nebulizer solution 2.5 mg  2.5 mg Nebulization Q2H PRN Shaune Pollack, MD      . guaiFENesin (ROBITUSSIN) 100 MG/5ML solution 100 mg  5 mL Oral Q4H PRN Bincy S Varughese, NP      . methylPREDNISolone sodium succinate (SOLU-MEDROL) 40 mg/mL injection 40 mg  40 mg Intravenous Q6H Loleta Rose, MD   40 mg at 03/15/16 0906  . ondansetron (ZOFRAN) tablet 4 mg  4 mg Oral Q6H PRN Shaune Pollack, MD       Or  . ondansetron [facility]) injection 4 mg  4 mg Intravenous Q6H PRN Shaune Pollack, MD      . pantoprazole (PROTONIX) EC tablet 40 mg  40 mg Oral Daily Shaune Pollack, MD   40 mg at 03/15/16 0914  . sodium chloride flush (NS) 0.9 % injection 3 mL  3 mL Intravenous Q12H Lamont Dowdy, MD   3 mL at 03/15/16 0911      Allergies: Allergies  Allergen Reactions  . Ampicillin     Rash   . Clarithromycin     unknown  . Codeine  Head feels like its crawling  . Effexor [Venlafaxine]     Hives   . Flagyl [Metronidazole]     unknown  . Fluoxetine Hcl Itching    Deep Itch   . Myrbetriq [Mirabegron]     Caused back pai, dry mouth  . Nsaids Other (See Comments)    Hypersensitivity vasculitis  . Paxil [Paroxetine]   . Reglan [Metoclopramide]     Blister in mouth   . Sulfa Antibiotics     Stomach irritation  . Tetracycline     Severe stomach pain      Past Medical History: Past Medical History:  Diagnosis Date  . Allergy   . Anxiety   . Complication of anesthesia    difficulty waking up  . Depression   . Dysrhythmia    svt  . GERD (gastroesophageal reflux disease)   . Heart murmur    never seen cardiologist for this  . Leukocytoclastic vasculitis (HCC)    Dr. Marchelle Gearing and Dr. Corliss Skains  . Migraines    maybe one a year  . Sleep apnea    cpap  . SVT (supraventricular tachycardia) (HCC)   . Wegner's disease (congenital syphilitic osteochondritis)      Past Surgical  History: Past Surgical History:  Procedure Laterality Date  . BUNIONECTOMY Bilateral   . CARPAL TUNNEL RELEASE Right 2002  . JOINT REPLACEMENT Right    knee  . TONSILLECTOMY  08/2002  . TOTAL KNEE ARTHROPLASTY Right   . TOTAL KNEE ARTHROPLASTY Left 02/21/2014   Procedure: LEFT TOTAL KNEE ARTHROPLASTY;  Surgeon: Velna Ochs, MD;  Location: [facility] OR;  Service: Orthopedics;  Laterality: Left;  . TUBAL LIGATION  1980     Family History: Family History  Problem Relation Age of Onset  . Clotting disorder Mother 74    Blood clot, foot amputation  . Cervical cancer Mother   . Arthritis Sister   . Heart attack Maternal Grandmother 51  . Breast cancer Sister   . Asthma Cousin      Social History: Social History   Social History  . Marital status: Divorced    Spouse name: N/A  . Number of children: N/A  . Years of education: N/A   Occupational History  . Unemployed    Social History Main Topics  . Smoking status: Never Smoker  . Smokeless tobacco: Never Used  . Alcohol use No  . Drug use: No  . Sexual activity: Not on file   Other Topics Concern  . Not on file   Social History Narrative  . No narrative on file     Review of Systems: ROS  Vital Signs: Blood pressure (!) 155/76, pulse 64, temperature 98.6 F (37 C), temperature source Oral, resp. rate 16, height 5\' 3"  (1.6 m), weight 112.1 kg (247 lb 3 oz), SpO2 97 %.  Weight trends: Filed Weights   03/13/16 1725 03/13/16 2131  Weight: 109.3 kg (241 lb) 112.1 kg (247 lb 3 oz)    Physical Exam: General: NAD, laying in bed  Head: Normocephalic, atraumatic. Moist oral mucosal membranes  Eyes: Anicteric, PERRL  Neck: Supple, trachea midline  Lungs:  Clear to auscultation  Heart: Regular rate and rhythm  Abdomen:  Soft, nontender,   Extremities: no peripheral edema.  Neurologic: Nonfocal, moving all four extremities  Skin: No lesions        Lab results: Basic Metabolic Panel:  Recent Labs Lab  03/13/16 1837 03/14/16 0542 03/15/16 0559  NA 135 139 138  K 3.9 4.7 4.5  CL 97* 106 108  CO2 29 28 25   GLUCOSE 106* 163* 196*  BUN 25* 24* 25*  CREATININE 0.99 0.93 0.91  CALCIUM 8.7* 8.6* 8.8*    Liver Function Tests:  Recent Labs Lab 03/13/16 1837  AST 21  ALT 27  ALKPHOS 84  BILITOT 0.5  PROT 7.2  ALBUMIN 3.2*   No results for input(s): LIPASE, AMYLASE in the last 168 hours. No results for input(s): AMMONIA in the last 168 hours.  CBC:  Recent Labs Lab 03/13/16 1837 03/14/16 0542 03/15/16 0559  WBC 14.7* 12.3* 18.9*  NEUTROABS 11.8*  --   --   HGB 12.9 12.0 11.6*  HCT 37.8 34.9* 33.9*  MCV 87.6 87.0 88.4  PLT 324 289 274    Cardiac Enzymes: No results for input(s): CKTOTAL, CKMB, CKMBINDEX, TROPONINI in the last 168 hours.  BNP: Invalid input(s): POCBNP  CBG: No results for input(s): GLUCAP in the last 168 hours.  Microbiology: Results for orders placed or performed during the hospital encounter of 09/28/15  Urine culture     Status: Abnormal   Collection Time: 09/28/15  9:29 AM  Result Value Ref Range Status   Specimen Description URINE, CLEAN CATCH  Final   Special Requests NONE  Final   Culture >=100,000 COLONIES/mL ESCHERICHIA COLI (A)  Final   Report Status 09/30/2015 FINAL  Final   Organism ID, Bacteria ESCHERICHIA COLI (A)  Final      Susceptibility   Escherichia coli - MIC*    AMPICILLIN <=2 SENSITIVE Sensitive     CEFAZOLIN <=4 SENSITIVE Sensitive     CEFTRIAXONE <=1 SENSITIVE Sensitive     CIPROFLOXACIN <=0.25 SENSITIVE Sensitive     GENTAMICIN >=16 RESISTANT Resistant     IMIPENEM <=0.25 SENSITIVE Sensitive     NITROFURANTOIN <=16 SENSITIVE Sensitive     TRIMETH/SULFA <=20 SENSITIVE Sensitive     AMPICILLIN/SULBACTAM <=2 SENSITIVE Sensitive     PIP/TAZO <=4 SENSITIVE Sensitive     * >=100,000 COLONIES/mL ESCHERICHIA COLI    Coagulation Studies: No results for input(s): LABPROT, INR in the last 72  hours.  Urinalysis:  Recent Labs  03/13/16 1728  COLORURINE YELLOW*  LABSPEC 1.012  PHURINE 7.0  GLUCOSEU NEGATIVE  HGBUR MODERATE*  BILIRUBINUR NEGATIVE  KETONESUR NEGATIVE  PROTEINUR 100*  NITRITE NEGATIVE  LEUKOCYTESUR NEGATIVE      Imaging: Dg Lumbar Spine 2-3 Views  Result Date: 03/14/2016 CLINICAL DATA:  Two week history of non ring back pain without known injury. EXAM: LUMBAR SPINE - 2-3 VIEW COMPARISON:  None. FINDINGS: Two views study shows no fracture or evidence of subluxation. There is convex rightward thoracolumbar scoliosis. Degenerative facet disease is noted in the lower lumbar spine with trace spondylolisthesis at L5-S1. SI joints are unremarkable. IMPRESSION: Mild degenerative changes lower lumbar spine. Electronically Signed   By: Kennith CenterEric  Mansell M.D.   On: 03/14/2016 18:02   [place] Renal  Result Date: 03/14/2016 CLINICAL DATA:  Hematuria.  Right flank pain for 2-3 weeks. EXAM: RENAL / URINARY TRACT ULTRASOUND COMPLETE COMPARISON:  12/24/2015 FINDINGS: Right Kidney: Length: 10.9 cm. Echogenicity within normal limits. No mass or hydronephrosis visualized. Left Kidney: Length: 10.6 cm. Echogenicity within normal limits. No mass or hydronephrosis visualized. Bladder: Appears normal for degree of bladder distention. IMPRESSION: Normal exam. Electronically Signed   By: Norva PavlovElizabeth  Brown M.D.   On: 03/14/2016 17:58      Assessment & Plan: Ms. Nat MathRhonda L Doughty is a 60 y.o. white female  with , who [facility]2016 white female with sleep apnea, hypertension, GERD, depression, anxiety, who [facility]  1. Hemoptysis 2. Hematuria 3. Proteinuria 4. Positive P-ANCA 5. Positive ANA 6. Indetermined Hepatitis C status.   Concern for vasculitis. Normal complements. 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 Previously only treated with steroids. No biopsy. Never evaluated by nephrology as per patient.  Renal  ultrasound without lesions Plan for renal biopsy on Tuesday 1/2. Will plan on my partner, Dr. Thedore MinsSingh performing.  Pending serologic work up.  Continue IV systemic steroids.  Discussed use of immunomodulating agents. Will go ahead and start cytoxan 1.5mg /kg ~150mg  PO  LOS: 2 Danai Gotto 12/30/201710:01 AM

## 2016-03-15 NOTE — Progress Notes (Signed)
IVF's discontinued. Cytoxan po started with education done. Continues to collect 24 hour urine which will end at 2030 tonight.

## 2016-03-16 ENCOUNTER — Inpatient Hospital Stay: Payer: Medicare HMO

## 2016-03-16 DIAGNOSIS — R042 Hemoptysis: Secondary | ICD-10-CM

## 2016-03-16 LAB — HCV RNA QUANT: HCV QUANT: NOT DETECTED [IU]/mL (ref >50)

## 2016-03-16 NOTE — Consult Note (Signed)
Follow-up connective tissue disease No significant shortness of breath. Still having some brown reddish productive cough. No chest pain. No oxygen requirement 24-hour urine protein elevated. Has seen nephrology. Started on Cytoxan. For renal biopsy No new skin lesions. She feels like the lesions on her anterior shin are improving  Examination: Afebrile. Normal O2 sat. No new skin lesions right and left legs. Nonblanching petechial changes resolving. Faint crackles left base. Clears with cough. No loud P2. Trace edema. No synovitis  CBC creatinine stable. Normal complements. Repeat ANA negative. 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 No evidence of compression fracture. Component of spondylolisthesis without radiculopathy  Recommendations Appreciate nephrology input and plan. Recommend follow-up chest x-ray. Will recheck sedimentation rate

## 2016-03-16 NOTE — Progress Notes (Signed)
[facility] [place] [facility]   PATIENT NAME: Hartford PoliRhonda Mangus    MR#:  [id number]  DATE OF BIRTH:  November 22, 1955  SUBJECTIVE:  CHIEF COMPLAINT:   Chief Complaint  Patient presents with  . Back Pain  . Hematuria  Hemoptysis better but still see some. Denies any blood in her urine. Rash improved No new complaints. No overnight issues REVIEW OF SYSTEMS:  Review of Systems  Constitutional: Positive for malaise/fatigue. Negative for chills, fever and weight loss.  HENT: Negative for nosebleeds and sore throat.   Eyes: Negative for blurred vision.  Respiratory: Positive for hemoptysis. Negative for cough, shortness of breath and wheezing.   Cardiovascular: Negative for chest pain, orthopnea, leg swelling and PND.  Gastrointestinal: Negative for abdominal pain, constipation, diarrhea, heartburn, nausea and vomiting.  Genitourinary: Negative for dysuria, hematuria and urgency.  Musculoskeletal: Negative for back pain.  Skin: Negative for rash.  Neurological: Negative for dizziness, speech change, focal weakness, weakness and headaches.  Endo/Heme/Allergies: Does not bruise/bleed easily.  Psychiatric/Behavioral: Negative for depression.   DRUG ALLERGIES:   Allergies  Allergen Reactions  . Ampicillin     Rash   . Clarithromycin     unknown  . Codeine     Head feels like its crawling  . Effexor [Venlafaxine]     Hives   . Flagyl [Metronidazole]     unknown  . Fluoxetine Hcl Itching    Deep Itch   . Myrbetriq [Mirabegron]     Caused back pai, dry mouth  . Nsaids Other (See Comments)    Hypersensitivity vasculitis  . Paxil [Paroxetine]   . Reglan [Metoclopramide]     Blister in mouth   . Sulfa Antibiotics     Stomach irritation  . Tetracycline     Severe stomach pain   VITALS:  Blood pressure 132/74, pulse 62, temperature 98.2 F (36.8 C), temperature source Oral, resp. rate 20, height 5\' 3"  (1.6 m), weight 112.1 kg (247 lb 3 oz), SpO2 97  %. PHYSICAL EXAMINATION:  Physical Exam  Constitutional: She is oriented to person, place, and time and well-developed, well-nourished, and in no distress.  HENT:  Head: Normocephalic and atraumatic.  Eyes: Conjunctivae and EOM are normal. Pupils are equal, round, and reactive to light.  Neck: Normal range of motion. Neck supple. No tracheal deviation present. No thyromegaly present.  Cardiovascular: Normal rate, regular rhythm and normal heart sounds.   Pulmonary/Chest: Effort normal and breath sounds normal. No respiratory distress. She has no wheezes. She exhibits no tenderness.  Abdominal: Soft. Bowel sounds are normal. She exhibits no distension. There is no tenderness.  Musculoskeletal: Normal range of motion.  Neurological: She is alert and oriented to person, place, and time. No cranial nerve deficit.  Skin: Skin is warm and dry. No rash noted.  Psychiatric: Mood and affect normal.   LABORATORY PANEL:   CBC  Recent Labs Lab 03/15/16 0559  WBC 18.9*  HGB 11.6*  HCT 33.9*  PLT 274   ------------------------------------------------------------------------------------------------------------------ Chemistries   Recent Labs Lab 03/13/16 1837  03/15/16 0559  NA 135  < > 138  K 3.9  < > 4.5  CL 97*  < > 108  CO2 29  < > 25  GLUCOSE 106*  < > 196*  BUN 25*  < > 25*  CREATININE 0.99  < > 0.91  CALCIUM 8.7*  < > 8.8*  AST 21  --   --   ALT 27  --   --  ALKPHOS 84  --   --   BILITOT 0.5  --   --   < > = values in this interval not displayed. RADIOLOGY:  No results found. ASSESSMENT AND PLAN:  Hartford PoliRhonda  Garlington is a 60 year old female with k/h/o Wegner's disease, depression, GERD, sleep apnea. Not compliant with outpatient steroids due to swelling of the tongue. Admitted to inpatient and ordered I/V solumedrol.  * Pulmonary ANCA vasculitis: with Hemoptysis, hematuria and rash - reports continued hemoptysis, but resolved hematuria and rash - Continue IV Solu-Medrol every  6 hours, NEB when necessary -Nephrology is considering renal biopsy on January 2 by Dr. Thedore MinsSingh, Cytoxan by mouth initiatedOn 03/15/2016 - Appreciate nephrology and rheumatology recommendations - Pulmo following - continue robitussion prn for cough  -Hepatitis B surface antigen negative -Hep C antibody 7.3  * Leukocytosis. The wbc 12.3-18.9 Possible due to reaction or steroid. Monitor CBC.  * Dehydration: Improved with IV fluids and monitor BMP.  * GERD: continue protonix     All the records are reviewed and case discussed with Care Management/Social Worker. Management plans discussed with the patient, family and they are in agreement.  CODE STATUS: FULL CODE  TOTAL TIME TAKING CARE OF THIS PATIENT: 36 minutes.   More than 50% of the time was spent in counseling/coordination of care: YES  POSSIBLE D/C IN 1-2 DAYS, DEPENDING ON CLINICAL CONDITION. And pulmo eval    Ramonita LabGouru, Vanda Waskey M.D on 03/16/2016 at 1:49 PM  Between 7am to 6pm - Pager - [phone]  After 6pm go to [email or web address] - Social research officer, governmentpassword EPAS [facility]  [facility]  Office  [phone]  CC: Primary care physician; Illa LevelORNETTO, LINDSEY N, NP  Note: This dictation was prepared with Dragon dictation along with smaller phrase technology. Any transcriptional errors that result from this process are unintentional.

## 2016-03-16 NOTE — Progress Notes (Signed)
Quiet day. Reports "just alittle sore in my back"- refused prn meds. Up ad lib and tolerated well. Cytoxan po continued and tolerating well. Chest xray done. Pt reports she plans to have kidney biopsy on Tuesday.

## 2016-03-16 NOTE — Progress Notes (Signed)
[facility]  ROUNDING NOTE   Subjective:     Objective:  Vital signs in last 24 hours:  Temp:  [97.9 F (36.6 C)-98 F (36.7 C)] 98 F (36.7 C) (12/31 0549) Pulse Rate:  [55-63] 63 (12/31 0549) Resp:  [18] 18 (12/31 0549) BP: (144-157)/(68-75) 148/75 (12/31 0549) SpO2:  [97 %-99 %] 98 % (12/31 0549)  Weight change:  Filed Weights   03/13/16 1725 03/13/16 2131  Weight: 109.3 kg (241 lb) 112.1 kg (247 lb 3 oz)    Intake/Output: I/O last 3 completed shifts: In: 5374.3 [P.O.:960; I.V.:4414.3] Out: 3500 [Urine:3500]   Intake/Output this shift:  Total I/O In: 240 [P.O.:240] Out: -   Physical Exam: General: NAD,   Head: Normocephalic, atraumatic. Moist oral mucosal membranes  Eyes: Anicteric, PERRL  Neck: Supple, trachea midline  Lungs:  Clear to auscultation  Heart: Regular rate and rhythm  Abdomen:  Soft, nontender, obese  Extremities: trace peripheral edema.  Neurologic: Nonfocal, moving all four extremities  Skin: No lesions       Basic Metabolic Panel:  Recent Labs Lab 03/13/16 1837 03/14/16 0542 03/15/16 0559  NA 135 139 138  K 3.9 4.7 4.5  CL 97* 106 108  CO2 29 28 25   GLUCOSE 106* 163* 196*  BUN 25* 24* 25*  CREATININE 0.99 0.93 0.91  CALCIUM 8.7* 8.6* 8.8*    Liver Function Tests:  Recent Labs Lab 03/13/16 1837  AST 21  ALT 27  ALKPHOS 84  BILITOT 0.5  PROT 7.2  ALBUMIN 3.2*   No results for input(s): LIPASE, AMYLASE in the last 168 hours. No results for input(s): AMMONIA in the last 168 hours.  CBC:  Recent Labs Lab 03/13/16 1837 03/14/16 0542 03/15/16 0559  WBC 14.7* 12.3* 18.9*  NEUTROABS 11.8*  --   --   HGB 12.9 12.0 11.6*  HCT 37.8 34.9* 33.9*  MCV 87.6 87.0 88.4  PLT 324 289 274    Cardiac Enzymes: No results for input(s): CKTOTAL, CKMB, CKMBINDEX, TROPONINI in the last 168 hours.  BNP: Invalid input(s): POCBNP  CBG: No results for input(s): GLUCAP in the last 168  hours.  Microbiology: Results for orders placed or performed during the hospital encounter of 09/28/15  Urine culture     Status: Abnormal   Collection Time: 09/28/15  9:29 AM  Result Value Ref Range Status   Specimen Description URINE, CLEAN CATCH  Final   Special Requests NONE  Final   Culture >=100,000 COLONIES/mL ESCHERICHIA COLI (A)  Final   Report Status 09/30/2015 FINAL  Final   Organism ID, Bacteria ESCHERICHIA COLI (A)  Final      Susceptibility   Escherichia coli - MIC*    AMPICILLIN <=2 SENSITIVE Sensitive     CEFAZOLIN <=4 SENSITIVE Sensitive     CEFTRIAXONE <=1 SENSITIVE Sensitive     CIPROFLOXACIN <=0.25 SENSITIVE Sensitive     GENTAMICIN >=16 RESISTANT Resistant     IMIPENEM <=0.25 SENSITIVE Sensitive     NITROFURANTOIN <=16 SENSITIVE Sensitive     TRIMETH/SULFA <=20 SENSITIVE Sensitive     AMPICILLIN/SULBACTAM <=2 SENSITIVE Sensitive     PIP/TAZO <=4 SENSITIVE Sensitive     * >=100,000 COLONIES/mL ESCHERICHIA COLI    Coagulation Studies:  Recent Labs  03/15/16 1143  LABPROT 13.2  INR 1.00    Urinalysis:  Recent Labs  03/13/16 1728  COLORURINE YELLOW*  LABSPEC 1.012  PHURINE 7.0  GLUCOSEU NEGATIVE  HGBUR MODERATE*  BILIRUBINUR NEGATIVE  KETONESUR NEGATIVE  PROTEINUR  100*  NITRITE NEGATIVE  LEUKOCYTESUR NEGATIVE      Imaging: Dg Lumbar Spine 2-3 Views  Result Date: 03/14/2016 CLINICAL DATA:  Two week history of non ring back pain without known injury. EXAM: LUMBAR SPINE - 2-3 VIEW COMPARISON:  None. FINDINGS: Two views study shows no fracture or evidence of subluxation. There is convex rightward thoracolumbar scoliosis. Degenerative facet disease is noted in the lower lumbar spine with trace spondylolisthesis at L5-S1. SI joints are unremarkable. IMPRESSION: Mild degenerative changes lower lumbar spine. Electronically Signed   By: Kennith CenterEric  [name] M.D.   On: 03/14/2016 18:02   [place] Renal  Result Date: 03/14/2016 CLINICAL DATA:  Hematuria.  Right  flank pain for 2-3 weeks. EXAM: RENAL / URINARY TRACT ULTRASOUND COMPLETE COMPARISON:  12/24/2015 FINDINGS: Right Kidney: Length: 10.9 cm. Echogenicity within normal limits. No mass or hydronephrosis visualized. Left Kidney: Length: 10.6 cm. Echogenicity within normal limits. No mass or hydronephrosis visualized. Bladder: Appears normal for degree of bladder distention. IMPRESSION: Normal exam. Electronically Signed   By: Cassandra PavlovElizabeth  [name] M.D.   On: 03/14/2016 17:58     Medications:    . cyclophosphamide  150 mg Oral Daily  . methylPREDNISolone (SOLU-MEDROL) injection  40 mg Intravenous Q6H  . pantoprazole  40 mg Oral Daily  . sodium chloride flush  3 mL Intravenous Q12H   acetaminophen **OR** acetaminophen, albuterol, guaiFENesin, ondansetron **OR** ondansetron (ZOFRAN) IV, sodium chloride flush  Assessment/ Plan:  [name] is a 60 y.o. white female with sleep apnea, hypertension, GERD, depression, anxiety, who [facility]  1. Hemoptysis 2. Hematuria 3. Proteinuria 4. Positive P-ANCA 5. Positive ANA 6. Indetermined Hepatitis C status.   Concern for vasculitis. Normal complements. 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 Previously only treated with steroids. No biopsy.   Subnephrotic range proteinuria Renal ultrasound without lesions Plan for renal biopsy on Tuesday 1/2. Will plan on my partner, Dr. Thedore [name] performing.  Pending serologic work up.  Continue IV systemic steroids.  Cytoxan 150mg  PO daily. Discussed risks and benefits of this agent with patient.    LOS: 3 Cassandra [name] 12/31/201710:53 AM

## 2016-03-17 ENCOUNTER — Inpatient Hospital Stay
Admit: 2016-03-17 | Discharge: 2016-03-17 | Disposition: A | Discharge status: Home / Self Care | Payer: Medicare HMO | Attending: Internal Medicine | Admitting: Internal Medicine

## 2016-03-17 LAB — SEDIMENTATION RATE: SED RATE: 48 mm/h — AB (ref 0–30)

## 2016-03-17 NOTE — Progress Notes (Signed)
[facility] [place] at [facility]   PATIENT NAME: Hartford PoliRhonda Rosebrook    MR#:  [id number]  DATE OF BIRTH:  07-18-1955  SUBJECTIVE:  CHIEF COMPLAINT:   Chief Complaint  Patient presents with  . Back Pain  . Hematuria  Hemoptysis better but still see some. No new complaints. Awaiting for renal biopsy tomorrow REVIEW OF SYSTEMS:  Review of Systems  Constitutional: Positive for malaise/fatigue. Negative for chills, fever and weight loss.  HENT: Negative for nosebleeds and sore throat.   Eyes: Negative for blurred vision.  Respiratory: Positive for hemoptysis. Negative for cough, shortness of breath and wheezing.   Cardiovascular: Negative for chest pain, orthopnea, leg swelling and PND.  Gastrointestinal: Negative for abdominal pain, constipation, diarrhea, heartburn, nausea and vomiting.  Genitourinary: Negative for dysuria, hematuria and urgency.  Musculoskeletal: Negative for back pain.  Skin: Negative for rash.  Neurological: Negative for dizziness, speech change, focal weakness, weakness and headaches.  Endo/Heme/Allergies: Does not bruise/bleed easily.  Psychiatric/Behavioral: Negative for depression.   DRUG ALLERGIES:   Allergies  Allergen Reactions  . Ampicillin     Rash   . Clarithromycin     unknown  . Codeine     Head feels like its crawling  . Effexor [Venlafaxine]     Hives   . Flagyl [Metronidazole]     unknown  . Fluoxetine Hcl Itching    Deep Itch   . Myrbetriq [Mirabegron]     Caused back pai, dry mouth  . Nsaids Other (See Comments)    Hypersensitivity vasculitis  . Paxil [Paroxetine]   . Reglan [Metoclopramide]     Blister in mouth   . Sulfa Antibiotics     Stomach irritation  . Tetracycline     Severe stomach pain   VITALS:  Blood pressure (!) 156/77, pulse (!) 58, temperature 98.5 F (36.9 C), temperature source Oral, resp. rate 18, height 5\' 3"  (1.6 m), weight 112.1 kg (247 lb 3 oz), SpO2 97 %. PHYSICAL EXAMINATION:    Physical Exam  Constitutional: She is oriented to person, place, and time and well-developed, well-nourished, and in no distress.  HENT:  Head: Normocephalic and atraumatic.  Eyes: Conjunctivae and EOM are normal. Pupils are equal, round, and reactive to light.  Neck: Normal range of motion. Neck supple. No tracheal deviation present. No thyromegaly present.  Cardiovascular: Normal rate, regular rhythm and normal heart sounds.   Pulmonary/Chest: Effort normal and breath sounds normal. No respiratory distress. She has no wheezes. She exhibits no tenderness.  Abdominal: Soft. Bowel sounds are normal. She exhibits no distension. There is no tenderness.  Musculoskeletal: Normal range of motion.  Neurological: She is alert and oriented to person, place, and time. No cranial nerve deficit.  Skin: Skin is warm and dry. No rash noted.  Psychiatric: Mood and affect normal.   LABORATORY PANEL:   CBC  Recent Labs Lab 03/15/16 0559  WBC 18.9*  HGB 11.6*  HCT 33.9*  PLT 274   ------------------------------------------------------------------------------------------------------------------ Chemistries   Recent Labs Lab 03/13/16 1837  03/15/16 0559  NA 135  < > 138  K 3.9  < > 4.5  CL 97*  < > 108  CO2 29  < > 25  GLUCOSE 106*  < > 196*  BUN 25*  < > 25*  CREATININE 0.99  < > 0.91  CALCIUM 8.7*  < > 8.8*  AST 21  --   --   ALT 27  --   --  ALKPHOS 84  --   --   BILITOT 0.5  --   --   < > = values in this interval not displayed. RADIOLOGY:  No results found. ASSESSMENT AND PLAN:  Jaculin Rasmus is a 61 year old female with k/h/o Wegner's disease, depression, GERD, sleep apnea. Not compliant with outpatient steroids due to swelling of the tongue. Admitted to inpatient and ordered I/V solumedrol.  * Pulmonary ANCA vasculitis: with Hemoptysis, hematuria and rash - reports continued hemoptysis, but resolved hematuria and rash - Continue IV Solu-Medrol every 6 hours, NEB when  necessary -Nephrology is considering renal biopsy on January 2 by Dr. Thedore Mins, Cytoxan by mouth initiatedOn 03/15/2016 -Sedimentation rate 48 - Appreciate nephrology , pulmonology and rheumatology recommendations - continue robitussion prn for cough  -Hepatitis B surface antigen negative -Hep C virus not detected Repeat chest x-ray with mild interstitial edema and cardiomegaly  * Leukocytosis. The wbc 12.3-18.9 Possible due to reaction or steroid. Monitor CBC.  * Dehydration: Improved with IV fluids and monitor BMP.  * GERD: continue protonix     All the records are reviewed and case discussed with Care Management/Social Worker. Management plans discussed with the patient, family and they are in agreement.  CODE STATUS: FULL CODE  TOTAL TIME TAKING CARE OF THIS PATIENT: 36 minutes.   More than 50% of the time was spent in counseling/coordination of care: YES  POSSIBLE D/C IN 1-2 DAYS, DEPENDING ON CLINICAL CONDITION. And pulmo eval    Ramonita Lab M.D on 03/17/2016 at 4:19 PM  Between 7am to 6pm - Pager - [phone]  After 6pm go to [email or web address] - Social research officer, government  [facility]  Office  [phone]  CC: Primary care physician; Illa Level, NP  Note: This dictation was prepared with Dragon dictation along with smaller phrase technology. Any transcriptional errors that result from this process are unintentional.

## 2016-03-17 NOTE — Progress Notes (Signed)
Patient was complaining of mild pain and cough  but refused to take cough medication for the cough and pain. No acute distress noted.  Patient is resting at this time  with her eyes closed, respirations even and unlabored. Will continue to monitor.

## 2016-03-17 NOTE — Progress Notes (Signed)
*  PRELIMINARY RESULTS* Echocardiogram 2D Echocardiogram has been performed.  Cassandra [name] 03/17/2016, 2:24 PM 

## 2016-03-17 NOTE — Progress Notes (Signed)
[facility]  ROUNDING NOTE   Subjective:   Resting comfortably. Reports no more hemoptysis.   Objective:  Vital signs in last 24 hours:  Temp:  [97.5 F (36.4 C)-98.2 F (36.8 C)] 97.5 F (36.4 C) (01/01 0450) Pulse Rate:  [56-67] 56 (01/01 0450) Resp:  [18-20] 18 (01/01 0450) BP: (132-151)/(66-79) 150/79 (01/01 0450) SpO2:  [97 %-98 %] 97 % (01/01 0450)  Weight change:  Filed Weights   03/13/16 1725 03/13/16 2131  Weight: 109.3 kg (241 lb) 112.1 kg (247 lb 3 oz)    Intake/Output: I/O last 3 completed shifts: In: 480 [P.O.:480] Out: 1200 [Urine:1200]   Intake/Output this shift:  Total I/O In: 240 [P.O.:240] Out: -   Physical Exam: General: NAD,   Head: Normocephalic, atraumatic. Moist oral mucosal membranes  Eyes: Anicteric, PERRL  Neck: Supple, trachea midline  Lungs:  Clear to auscultation  Heart: Regular rate and rhythm  Abdomen:  Soft, nontender, obese  Extremities: trace peripheral edema.  Neurologic: Nonfocal, moving all four extremities  Skin: No lesions       Basic Metabolic Panel:  Recent Labs Lab 03/13/16 1837 03/14/16 0542 03/15/16 0559  NA 135 139 138  K 3.9 4.7 4.5  CL 97* 106 108  CO2 29 28 25   GLUCOSE 106* 163* 196*  BUN 25* 24* 25*  CREATININE 0.99 0.93 0.91  CALCIUM 8.7* 8.6* 8.8*    Liver Function Tests:  Recent Labs Lab 03/13/16 1837  AST 21  ALT 27  ALKPHOS 84  BILITOT 0.5  PROT 7.2  ALBUMIN 3.2*   No results for input(s): LIPASE, AMYLASE in the last 168 hours. No results for input(s): AMMONIA in the last 168 hours.  CBC:  Recent Labs Lab 03/13/16 1837 03/14/16 0542 03/15/16 0559  WBC 14.7* 12.3* 18.9*  NEUTROABS 11.8*  --   --   HGB 12.9 12.0 11.6*  HCT 37.8 34.9* 33.9*  MCV 87.6 87.0 88.4  PLT 324 289 274    Cardiac Enzymes: No results for input(s): CKTOTAL, CKMB, CKMBINDEX, TROPONINI in the last 168 hours.  BNP: Invalid input(s): POCBNP  CBG: No results for input(s): GLUCAP in  the last 168 hours.  Microbiology: Results for orders placed or performed during the hospital encounter of 09/28/15  Urine culture     Status: Abnormal   Collection Time: 09/28/15  9:29 AM  Result Value Ref Range Status   Specimen Description URINE, CLEAN CATCH  Final   Special Requests NONE  Final   Culture >=100,000 COLONIES/mL ESCHERICHIA COLI (A)  Final   Report Status 09/30/2015 FINAL  Final   Organism ID, Bacteria ESCHERICHIA COLI (A)  Final      Susceptibility   Escherichia coli - MIC*    AMPICILLIN <=2 SENSITIVE Sensitive     CEFAZOLIN <=4 SENSITIVE Sensitive     CEFTRIAXONE <=1 SENSITIVE Sensitive     CIPROFLOXACIN <=0.25 SENSITIVE Sensitive     GENTAMICIN >=16 RESISTANT Resistant     IMIPENEM <=0.25 SENSITIVE Sensitive     NITROFURANTOIN <=16 SENSITIVE Sensitive     TRIMETH/SULFA <=20 SENSITIVE Sensitive     AMPICILLIN/SULBACTAM <=2 SENSITIVE Sensitive     PIP/TAZO <=4 SENSITIVE Sensitive     * >=100,000 COLONIES/mL ESCHERICHIA COLI    Coagulation Studies:  Recent Labs  03/15/16 1143  LABPROT 13.2  INR 1.00    Urinalysis: No results for input(s): COLORURINE, LABSPEC, PHURINE, GLUCOSEU, HGBUR, BILIRUBINUR, KETONESUR, PROTEINUR, UROBILINOGEN, NITRITE, LEUKOCYTESUR in the last 72 hours.  Invalid input(s): APPERANCEUR  Imaging: Dg Chest 2 View  Result Date: 03/16/2016 CLINICAL DATA:  Productive cough and chest tightness. EXAM: CHEST  2 VIEW COMPARISON:  June 17, 2013 FINDINGS: The mediastinal contour is normal. The heart size is mildly enlarged. There is mild diffuse increased bilateral pulmonary interstitium. There is no pleural effusion. The visualized skeletal structures are stable. IMPRESSION: Cardiomegaly with mild interstitial edema. Electronically Signed   By: Sherian ReinWei-Chen  Lin M.D.   On: 03/16/2016 15:25     Medications:    . cyclophosphamide  150 mg Oral Daily  . methylPREDNISolone (SOLU-MEDROL) injection  40 mg Intravenous Q6H  . pantoprazole  40  mg Oral Daily  . sodium chloride flush  3 mL Intravenous Q12H   acetaminophen **OR** acetaminophen, albuterol, guaiFENesin, ondansetron **OR** ondansetron (ZOFRAN) IV, sodium chloride flush  Assessment/ Plan:  [name] is a 61 y.o. white female with sleep apnea, hypertension, GERD, depression, anxiety, who [facility]  1. Hemoptysis 2. Hematuria 3. Proteinuria 4. Positive P-ANCA 5. Positive ANA 6. Indetermined Hepatitis C status.   Concern for vasculitis. Normal complements. 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 Previously only treated with steroids. No biopsy.   Subnephrotic range proteinuria Renal ultrasound without lesions Plan for renal biopsy on Tuesday 1/2. Will plan on my partner, Dr. Thedore MinsSingh performing.  Pending serologic work up. So far Positive ANA and positive myeloperoxidase Continue IV systemic steroids.  Cytoxan 150mg  PO daily. Discussed risks and benefits of this agent with patient.    LOS: 4 Cassandra [name] 1/1/201810:00 AM

## 2016-03-18 ENCOUNTER — Inpatient Hospital Stay: Payer: Medicare HMO

## 2016-03-18 LAB — PROTEIN ELECTRO, RANDOM URINE
ALPHA-2-GLOBULIN, U: 3.3 %
Albumin ELP, Urine: 69.4 %
Alpha-1-Globulin, U: 3 %
BETA GLOBULIN, U: 14 %
GAMMA GLOBULIN, U: 10.3 %
PDF: 0
TOTAL PROTEIN, URINE-UPE24: 48.6 mg/dL

## 2016-03-18 LAB — PROTEIN ELECTROPHORESIS, SERUM
A/G Ratio: 0.8 (ref 0.7–1.7)
ALBUMIN ELP: 2.9 g/dL (ref 2.9–4.4)
ALPHA-1-GLOBULIN: 0.3 g/dL (ref 0.0–0.4)
ALPHA-2-GLOBULIN: 1 g/dL (ref 0.4–1.0)
BETA GLOBULIN: 0.8 g/dL (ref 0.7–1.3)
GAMMA GLOBULIN: 1.7 g/dL (ref 0.4–1.8)
Globulin, Total: 3.8 g/dL (ref 2.2–3.9)
PDF: 0
Total Protein ELP: 6.7 g/dL (ref 6.0–8.5)

## 2016-03-18 LAB — URINALYSIS, ROUTINE W REFLEX MICROSCOPIC
Bacteria, UA: NONE SEEN
Bilirubin Urine: NEGATIVE
GLUCOSE, UA: 50 mg/dL — AB
Ketones, ur: NEGATIVE mg/dL
Leukocytes, UA: NEGATIVE
Nitrite: NEGATIVE
Protein, ur: 30 mg/dL — AB
SPECIFIC GRAVITY, URINE: 1.013 (ref 1.005–1.030)
pH: 6 (ref 5.0–8.0)

## 2016-03-18 LAB — PROTIME-INR
INR: 1.03
PROTHROMBIN TIME: 13.5 s (ref 11.4–15.2)

## 2016-03-18 LAB — CBC
HCT: 37.2 % (ref 35.0–47.0)
HEMOGLOBIN: 12.5 g/dL (ref 12.0–16.0)
MCH: 29.7 pg (ref 26.0–34.0)
MCHC: 33.7 g/dL (ref 32.0–36.0)
MCV: 88.2 fL (ref 80.0–100.0)
PLATELETS: 256 10*3/uL (ref 150–440)
RBC: 4.22 MIL/uL (ref 3.80–5.20)
RDW: 14.3 % (ref 11.5–14.5)
WBC: 15.6 10*3/uL — AB (ref 3.6–11.0)

## 2016-03-18 LAB — RENAL FUNCTION PANEL
ANION GAP: 9 (ref 5–15)
Albumin: 3.1 g/dL — ABNORMAL LOW (ref 3.5–5.0)
BUN: 32 mg/dL — ABNORMAL HIGH (ref 6–20)
CHLORIDE: 97 mmol/L — AB (ref 101–111)
CO2: 28 mmol/L (ref 22–32)
CREATININE: 1.05 mg/dL — AB (ref 0.44–1.00)
Calcium: 8.9 mg/dL (ref 8.9–10.3)
GFR, EST NON AFRICAN AMERICAN: 56 mL/min — AB (ref >60)
Glucose, Bld: 249 mg/dL — ABNORMAL HIGH (ref 65–99)
Phosphorus: 3.1 mg/dL (ref 2.5–4.6)
Potassium: 4.8 mmol/L (ref 3.5–5.1)
Sodium: 134 mmol/L — ABNORMAL LOW (ref 135–145)

## 2016-03-18 LAB — HEMOGLOBIN: HEMOGLOBIN: 12.7 g/dL (ref 12.0–16.0)

## 2016-03-18 LAB — GLUCOSE, CAPILLARY
GLUCOSE-CAPILLARY: 219 mg/dL — AB (ref 65–99)
Glucose-Capillary: 237 mg/dL — ABNORMAL HIGH (ref 65–99)

## 2016-03-18 LAB — GLOMERULAR BASEMENT MEMBRANE ANTIBODIES: GBM AB: 4 U (ref 0–20)

## 2016-03-18 LAB — ECHOCARDIOGRAM COMPLETE
Height: 63 in
WEIGHTICAEL: 3955 [oz_av]

## 2016-03-18 LAB — MPO/PR-3 (ANCA) ANTIBODIES
ANCA Proteinase 3: 3.9 U/mL — ABNORMAL HIGH (ref 0.0–3.5)
Myeloperoxidase Abs: >100 U/mL — ABNORMAL HIGH (ref 0.0–9.0)

## 2016-03-18 MED ORDER — SODIUM CHLORIDE 0.9 % IV SOLN
INTRAVENOUS | Status: DC
Start: 2016-03-18 — End: 2016-03-19
  Administered 2016-03-18: 08:00:00 via INTRAVENOUS

## 2016-03-18 MED ORDER — INSULIN ASPART 100 UNIT/ML ~~LOC~~ SOLN
0.0000 [IU] | Freq: Every day | SUBCUTANEOUS | Status: DC
  Administered 2016-03-18: 21:00:00 2 [IU] via SUBCUTANEOUS
  Filled 2016-03-18: qty 2

## 2016-03-18 MED ORDER — INSULIN ASPART 100 UNIT/ML ~~LOC~~ SOLN
0.0000 [IU] | Freq: Three times a day (TID) | SUBCUTANEOUS | Status: DC
  Administered 2016-03-18 – 2016-03-19 (×3): 3 [IU] via SUBCUTANEOUS
  Filled 2016-03-18 (×3): qty 3

## 2016-03-18 MED ORDER — HYDROCORTISONE ACETATE 25 MG RE SUPP
25.0000 mg | Freq: Two times a day (BID) | RECTAL | Status: DC
  Administered 2016-03-18 – 2016-03-19 (×2): 25 mg via RECTAL
  Filled 2016-03-18 (×4): qty 1

## 2016-03-18 NOTE — Progress Notes (Signed)
Inpatient Diabetes Program Recommendations  AACE/ADA: New Consensus Statement on Inpatient Glycemic Control (2015)  Target Ranges:  Prepandial:   less than 140 mg/dL      Peak postprandial:   less than 180 mg/dL (1-2 hours)      Critically ill patients:  140 - 180 mg/dL   Results for Cassandra [name], Cassandra [name] (MRN [id number]) as of 03/18/2016 10:52  Ref. Range 03/15/2016 05:59 03/18/2016 06:10  Glucose Latest Ref Range: 65 - 99 mg/dL 469196 (H) 629249 (H)    Admit with: Back Pain/ Hematuria  No History of DM noted.     MD- Note patient receiving Solumedrol 40 mg Q6 hours.  Elevated lab glucose this AM.  Please consider placing order for CBG checks TID ac + hs.  If CBGs elevated, please consider starting Novolog Sensitive Correction Scale/ SSI (0-9 units) TID AC + HS while patient getting steroids      --Will follow patient during hospitalization--  Ambrose FinlandJeannine [name] Cassandra Mancini RN, MSN, CDE Diabetes Coordinator Inpatient Glycemic Control Team Team Pager: [phone] (8a-5p)

## 2016-03-18 NOTE — Care Management Important Message (Signed)
Important Message  Patient Details  Name: Cassandra [name] MRN: [id number] Date of Birth: [birth year]   Medicare Important Message Given:  Yes    Gwenette GreetBrenda S Berdella Bacot, RN 03/18/2016, 8:29 AM

## 2016-03-18 NOTE — Procedures (Signed)
PROCEDURE: Informed written consent was obtained from the patient after a discussion of the risks, benefits and alternatives to treatment. The patient understands and consents the procedure. A timeout was performed prior to the initiation of the procedure.  Ultrasound scanning was performed of the bilateral flanks. The inferior pole of the LEFT  kidney was selected for biopsy due to location and sonographic window. The procedure was planned. The operative site was prepped and draped in the usual sterile fashion. The overlying soft tissues were anesthetized with 10 mL of 1% lidocaine-Epi.  A 18 gauge core needle biopsy device was advanced into the inferior cortex of the LEFT kidney and 2 core biopsies were obtained under direct ultrasound guidance. Real time pathologic review confirmed adequate tissue acquisition. Images were saved for documentation purposes. The biopsy device was removed and hemostasis was obtained with manual compression. Post procedural scanning was negative for significant post procedural hemorrhage or additional complication. A dressing was placed. The patient tolerated the procedure well without immediate post procedural complication.   T

## 2016-03-18 NOTE — Progress Notes (Signed)
Subjective:   Doing fair at present No acute c/o No hemoptysis Reports greenish sputum Some discomfort over rt lower lungs  Objective:  Vital signs in last 24 hours:  Temp:  [97.1 F (36.2 C)-98.5 F (36.9 C)] 97.3 F (36.3 C) (01/02 0422) Pulse Rate:  [56-59] 56 (01/02 0422) Resp:  [18-20] 18 (01/02 0422) BP: (136-156)/(71-80) 153/71 (01/02 0422) SpO2:  [97 %] 97 % (01/02 0422)  Weight change:  Filed Weights   03/13/16 1725 03/13/16 2131  Weight: 109.3 kg (241 lb) 112.1 kg (247 lb 3 oz)    Intake/Output:    Intake/Output Summary (Last 24 hours) at 03/18/16 1025 Last data filed at 03/17/16 1700  Gross per 24 hour  Intake              480 ml  Output                0 ml  Net              480 ml     Physical Exam: General: NAD, sitting up in bed  HEENT Moist oral mucus membranes  Neck supple  Pulm/lungs Clear b/l, normal effort on room air  CVS/Heart Regular, no rub  Abdomen:  Soft, NT  Extremities: No edema  Neurologic: Alert, oreinted  Skin: No acute rash          Basic Metabolic Panel:   Recent Labs Lab 03/13/16 1837 03/14/16 0542 03/15/16 0559 03/18/16 0610  NA 135 139 138 134*  K 3.9 4.7 4.5 4.8  CL 97* 106 108 97*  CO2 29 28 25 28   GLUCOSE 106* 163* 196* 249*  BUN 25* 24* 25* 32*  CREATININE 0.99 0.93 0.91 1.05*  CALCIUM 8.7* 8.6* 8.8* 8.9  PHOS  --   --   --  3.1     CBC:  Recent Labs Lab 03/13/16 1837 03/14/16 0542 03/15/16 0559 03/18/16 0610  WBC 14.7* 12.3* 18.9* 15.6*  NEUTROABS 11.8*  --   --   --   HGB 12.9 12.0 11.6* 12.5  HCT 37.8 34.9* 33.9* 37.2  MCV 87.6 87.0 88.4 88.2  PLT 324 289 274 256      Microbiology:  No results found for this or any previous visit (from the past 720 hour(s)).  Coagulation Studies:  Recent Labs  03/15/16 1143 03/18/16 0610  LABPROT 13.2 13.5  INR 1.00 1.03    Urinalysis: No results for input(s): COLORURINE, LABSPEC, PHURINE, GLUCOSEU, HGBUR, BILIRUBINUR, KETONESUR,  PROTEINUR, UROBILINOGEN, NITRITE, LEUKOCYTESUR in the last 72 hours.  Invalid input(s): APPERANCEUR    Imaging: Dg Chest 2 View  Result Date: 03/16/2016 CLINICAL DATA:  Productive cough and chest tightness. EXAM: CHEST  2 VIEW COMPARISON:  June 17, 2013 FINDINGS: The mediastinal contour is normal. The heart size is mildly enlarged. There is mild diffuse increased bilateral pulmonary interstitium. There is no pleural effusion. The visualized skeletal structures are stable. IMPRESSION: Cardiomegaly with mild interstitial edema. Electronically Signed   By: Abelardo Diesel M.D.   On: 03/16/2016 15:25     Medications:   . sodium chloride 50 mL/hr at 03/18/16 0746   . cyclophosphamide  150 mg Oral Daily  . hydrocortisone  25 mg Rectal BID  . methylPREDNISolone (SOLU-MEDROL) injection  40 mg Intravenous Q6H  . pantoprazole  40 mg Oral Daily  . sodium chloride flush  3 mL Intravenous Q12H   acetaminophen **OR** acetaminophen, albuterol, guaiFENesin, ondansetron **OR** ondansetron (ZOFRAN) IV, sodium chloride flush  Assessment/ Plan:  61 y.o. female  with sleep apnea, hypertension, GERD, depression, anxiety, who was admitted to [facility] on 03/13/2016  1. Hemoptysis 2. Hematuria 3. Proteinuria 4. Positive P-ANCA 5. 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  Pending serologic work up.  Currently getting IV systemic steroids- will change to oral later today Cytoxan 150mg  PO daily. Discussed risks and benefits of this agent with patient.   Results for Cassandra, [name] (MRN [id number]) as of 03/18/2016 09:25  Ref. Range 02/20/2016 17:02 03/14/2016 17:01  Anit Nuclear Antibody(ANA) Latest Ref Range: Negative  POS (A) Negative  ANA Pattern 1 Unknown HOMOGENEOUS (A)   ANA Titer 1 Latest Units: titer  >=2:4401 (H)   Cyclic Citrullin Peptide Ab Latest Units: Units <16   ds DNA Ab Latest Units: IU/mL 2   GBM Ab Interp Latest Ref Range: <1.0 AI  <1.0   Myeloperoxidase Ab Latest Ref Range: 0.0 - 9.0 U/mL >100.0 (H)   Serine Protease 3 Latest Ref Range: <1.0 AI  <1.0   RA Latex Turbid. Latest Ref Range: <14 IU/mL <14   P-ANCA Latest Ref Range: <1:20  1:160 (H)   C3 Complement Latest Ref Range: 82 - 167 mg/dL  158  Complement C4, Body Fluid Latest Ref Range: 14 - 44 mg/dL  21  Scleroderma (Scl-70) (ENA) Antibody, IgG Latest Ref Range: <1.0 NEG AI  <1.0 NEG      LOS: 5 Cassandra [name] 1/2/20189:22 AM

## 2016-03-18 NOTE — Progress Notes (Signed)
[employer] - [place] at [facility]   PATIENT NAME: Cassandra [name]    MR#:  [id number]  DATE OF BIRTH:  Apr 11, 1955  SUBJECTIVE:  CHIEF COMPLAINT:   Chief Complaint  Patient presents with  . Back Pain  . Hematuria  Hemoptysis better but still see some. No new complaints,  for renal biopsy today REVIEW OF SYSTEMS:  Review of Systems  Constitutional: Positive for malaise/fatigue. Negative for chills, fever and weight loss.  HENT: Negative for nosebleeds and sore throat.   Eyes: Negative for blurred vision.  Respiratory: Positive for hemoptysis. Negative for cough, shortness of breath and wheezing.   Cardiovascular: Negative for chest pain, orthopnea, leg swelling and PND.  Gastrointestinal: Negative for abdominal pain, constipation, diarrhea, heartburn, nausea and vomiting.  Genitourinary: Negative for dysuria, hematuria and urgency.  Musculoskeletal: Negative for back pain.  Skin: Negative for rash.  Neurological: Negative for dizziness, speech change, focal weakness, weakness and headaches.  Endo/Heme/Allergies: Does not bruise/bleed easily.  Psychiatric/Behavioral: Negative for depression.   DRUG ALLERGIES:   Allergies  Allergen Reactions  . Ampicillin     Rash   . Clarithromycin     unknown  . Codeine     Head feels like its crawling  . Effexor [Venlafaxine]     Hives   . Flagyl [Metronidazole]     unknown  . Fluoxetine Hcl Itching    Deep Itch   . Myrbetriq [Mirabegron]     Caused back pai, dry mouth  . Nsaids Other (See Comments)    Hypersensitivity vasculitis  . Paxil [Paroxetine]   . Reglan [Metoclopramide]     Blister in mouth   . Sulfa Antibiotics     Stomach irritation  . Tetracycline     Severe stomach pain   VITALS:  Blood pressure 123/85, pulse 68, temperature 97.3 F (36.3 C), temperature source Oral, resp. rate 18, height 5\' 3"  (1.6 m), weight 112.1 kg (247 lb 3 oz), SpO2 97 %. PHYSICAL EXAMINATION:  Physical Exam    Constitutional: She is oriented to person, place, and time and well-developed, well-nourished, and in no distress.  HENT:  Head: Normocephalic and atraumatic.  Eyes: Conjunctivae and EOM are normal. Pupils are equal, round, and reactive to light.  Neck: Normal range of motion. Neck supple. No tracheal deviation present. No thyromegaly present.  Cardiovascular: Normal rate, regular rhythm and normal heart sounds.   Pulmonary/Chest: Effort normal and breath sounds normal. No respiratory distress. She has no wheezes. She exhibits no tenderness.  Abdominal: Soft. Bowel sounds are normal. She exhibits no distension. There is no tenderness.  Musculoskeletal: Normal range of motion.  Neurological: She is alert and oriented to person, place, and time. No cranial nerve deficit.  Skin: Skin is warm and dry. No rash noted.  Psychiatric: Mood and affect normal.   LABORATORY PANEL:   CBC  Recent Labs Lab 03/18/16 0610  WBC 15.6*  HGB 12.5  HCT 37.2  PLT 256   ------------------------------------------------------------------------------------------------------------------ Chemistries   Recent Labs Lab 03/13/16 1837  03/18/16 0610  NA 135  < > 134*  K 3.9  < > 4.8  CL 97*  < > 97*  CO2 29  < > 28  GLUCOSE 106*  < > 249*  BUN 25*  < > 32*  CREATININE 0.99  < > 1.05*  CALCIUM 8.7*  < > 8.9  AST 21  --   --   ALT 27  --   --   ALKPHOS 84  --   --  BILITOT 0.5  --   --   < > = values in this interval not displayed. RADIOLOGY:  [place] Biopsy-no Radiologist  Result Date: 03/18/2016 Cassandra PigeonHarmeet Singh, MD     03/18/2016 11:05 AM PROCEDURE: Informed written consent was obtained from the patient after a discussion of the risks, benefits and alternatives to treatment. The patient understands and consents the procedure. A timeout was performed prior to the initiation of the procedure.  Ultrasound scanning was performed of the bilateral flanks. The inferior pole of the LEFT  kidney was selected for  biopsy due to location and sonographic window. The procedure was planned. The operative site was prepped and draped in the usual sterile fashion. The overlying soft tissues were anesthetized with 10 mL of 1% lidocaine-Epi.  A 18 gauge core needle biopsy device was advanced into the inferior cortex of the LEFT kidney and 2 core biopsies were obtained under direct ultrasound guidance. Real time pathologic review confirmed adequate tissue acquisition. Images were saved for documentation purposes. The biopsy device was removed and hemostasis was obtained with manual compression. Post procedural scanning was negative for significant post procedural hemorrhage or additional complication. A dressing was placed. The patient tolerated the procedure well without immediate post procedural complication. T   ASSESSMENT AND PLAN:  Cassandra PoliRhonda  [name] is a 61 year old female with k/h/o Wegner's disease, depression, GERD, sleep apnea. Not compliant with outpatient steroids due to swelling of the tongue. Admitted to inpatient and ordered I/V solumedrol.  * Pulmonary ANCA vasculitis: with Hemoptysis, hematuria and rash - reports continued hemoptysis, but resolved hematuria and rash - Continue IV Solu-Medrol every 6 hours, NEB when necessary -Patient had left kidney lower pole core biopsy at 2 places by Dr. Thedore MinsSingh 03/18/2016. Patient tolerated the procedure well Cytoxan by mouth initiatedOn 03/15/2016 -Sedimentation rate 48 - Appreciate nephrology , pulmonology and rheumatology recommendations - continue robitussion prn for cough  -Hepatitis B surface antigen negative -Hep C virus not detected Repeat chest x-ray with mild interstitial edema and cardiomegaly  * Leukocytosis. The wbc 12.3-18.9 Possible due to reaction or steroid. Monitor CBC.  * Dehydration: Improved with IV fluids and monitor BMP.  * GERD: continue protonix     All the records are reviewed and case discussed with Care Management/Social  Worker. Management plans discussed with the patient, family and they are in agreement.  CODE STATUS: FULL CODE  TOTAL TIME TAKING CARE OF THIS PATIENT: 36 minutes.   More than 50% of the time was spent in counseling/coordination of care: YES  POSSIBLE D/C IN a.m. DAYS, DEPENDING ON CLINICAL CONDITION. And pulmo eval    Ramonita LabGouru, Katrell Milhorn M.D on 03/18/2016 at 2:14 PM  Between 7am to 6pm - Pager - [phone]  After 6pm go to [email or web address] - Social research officer, [email or web address] [email or web address]  [facility]  Office  [phone]  CC: Primary care physician; Illa LevelORNETTO, LINDSEY N, NP  Note: This dictation was prepared with Dragon dictation along with smaller phrase technology. Any transcriptional errors that result from this process are unintentional.

## 2016-03-19 ENCOUNTER — Ambulatory Visit: Payer: Medicare HMO | Admitting: Acute Care

## 2016-03-19 LAB — GLUCOSE, CAPILLARY
Glucose-Capillary: 207 mg/dL — ABNORMAL HIGH (ref 65–99)
Glucose-Capillary: 231 mg/dL — ABNORMAL HIGH (ref 65–99)

## 2016-03-19 LAB — CBC
HEMATOCRIT: 36.1 % (ref 35.0–47.0)
Hemoglobin: 12.2 g/dL (ref 12.0–16.0)
MCH: 29.8 pg (ref 26.0–34.0)
MCHC: 33.8 g/dL (ref 32.0–36.0)
MCV: 88 fL (ref 80.0–100.0)
Platelets: 243 10*3/uL (ref 150–440)
RBC: 4.1 MIL/uL (ref 3.80–5.20)
RDW: 14.3 % (ref 11.5–14.5)
WBC: 17.4 10*3/uL — AB (ref 3.6–11.0)

## 2016-03-19 LAB — BASIC METABOLIC PANEL
ANION GAP: 6 (ref 5–15)
BUN: 40 mg/dL — ABNORMAL HIGH (ref 6–20)
CALCIUM: 8.8 mg/dL — AB (ref 8.9–10.3)
CO2: 28 mmol/L (ref 22–32)
Chloride: 100 mmol/L — ABNORMAL LOW (ref 101–111)
Creatinine, Ser: 1.16 mg/dL — ABNORMAL HIGH (ref 0.44–1.00)
GFR calc Af Amer: 58 mL/min — ABNORMAL LOW (ref >60)
GFR calc non Af Amer: 50 mL/min — ABNORMAL LOW (ref >60)
GLUCOSE: 209 mg/dL — AB (ref 65–99)
Potassium: 4.8 mmol/L (ref 3.5–5.1)
Sodium: 134 mmol/L — ABNORMAL LOW (ref 135–145)

## 2016-03-19 MED ORDER — METFORMIN HCL 500 MG PO TABS: 500.0000 mg | ORAL_TABLET | Freq: Two times a day (BID) | ORAL | 0 refills | Status: DC

## 2016-03-19 MED ORDER — HYDROXYZINE HCL 25 MG PO TABS
25.0000 mg | ORAL_TABLET | Freq: Three times a day (TID) | ORAL | Status: DC | PRN
  Administered 2016-03-19: 25 mg via ORAL
  Filled 2016-03-19: qty 1

## 2016-03-19 MED ORDER — PREDNISOLONE 5 MG PO TABS: 20.0000 mg | ORAL_TABLET | Freq: Three times a day (TID) | ORAL | 0 refills | Status: DC

## 2016-03-19 MED ORDER — HYDROCORTISONE ACETATE 25 MG RE SUPP: 25.0000 mg | Freq: Two times a day (BID) | RECTAL | 0 refills | Status: DC

## 2016-03-19 MED ORDER — HYDROXYZINE HCL 25 MG PO TABS: 25.0000 mg | ORAL_TABLET | Freq: Three times a day (TID) | ORAL | 0 refills | Status: DC | PRN

## 2016-03-19 MED ORDER — GUAIFENESIN 100 MG/5ML PO SOLN: 10.0000 mL | Freq: Four times a day (QID) | ORAL | 0 refills | Status: DC | PRN

## 2016-03-19 MED ORDER — ACETAMINOPHEN 325 MG PO TABS: 650.0000 mg | ORAL_TABLET | Freq: Four times a day (QID) | ORAL | Status: DC | PRN

## 2016-03-19 NOTE — Progress Notes (Signed)
Subjective:   Doing fair at present No acute c/o No hemoptysis Slight soreness over biopsy site but no pain Denies gross hematuria Labs reviewed   Objective:  Vital signs in last 24 hours:  Temp:  [97.5 F (36.4 C)-98 F (36.7 C)] 97.6 F (36.4 C) (01/03 0426) Pulse Rate:  [56-70] 56 (01/03 0426) Resp:  [16-20] 20 (01/03 0426) BP: (132-156)/(62-106) 156/82 (01/03 0426) SpO2:  [95 %-97 %] 95 % (01/03 0426)  Weight change:  Filed Weights   03/13/16 1725 03/13/16 2131  Weight: 109.3 kg (241 lb) 112.1 kg (247 lb 3 oz)    Intake/Output:    Intake/Output Summary (Last 24 hours) at 03/19/16 1142 Last data filed at 03/19/16 0800  Gross per 24 hour  Intake           644.17 ml  Output              200 ml  Net           444.17 ml     Physical Exam: General: NAD, sitting up in bed  HEENT Moist oral mucus membranes  Neck supple  Pulm/lungs Clear b/l, normal effort on room air  CVS/Heart Regular, no rub  Abdomen:  Soft, NT  Extremities: No edema  Neurologic: Alert, oreinted  Skin: No acute rash          Basic Metabolic Panel:   Recent Labs Lab 03/13/16 1837 03/14/16 0542 03/15/16 0559 03/18/16 0610 03/19/16 0615  NA 135 139 138 134* 134*  K 3.9 4.7 4.5 4.8 4.8  CL 97* 106 108 97* 100*  CO2 29 28 25 28 28   GLUCOSE 106* 163* 196* 249* 209*  BUN 25* 24* 25* 32* 40*  CREATININE 0.99 0.93 0.91 1.05* 1.16*  CALCIUM 8.7* 8.6* 8.8* 8.9 8.8*  PHOS  --   --   --  3.1  --      CBC:  Recent Labs Lab 03/13/16 1837 03/14/16 0542 03/15/16 0559 03/18/16 0610 03/18/16 1650 03/19/16 0615  WBC 14.7* 12.3* 18.9* 15.6*  --  17.4*  NEUTROABS 11.8*  --   --   --   --   --   HGB 12.9 12.0 11.6* 12.5 12.7 12.2  HCT 37.8 34.9* 33.9* 37.2  --  36.1  MCV 87.6 87.0 88.4 88.2  --  88.0  PLT 324 289 274 256  --  243      Microbiology:  No results found for this or any previous visit (from the past 720 hour(s)).  Coagulation Studies:  Recent Labs   03/18/16 0610  LABPROT 13.5  INR 1.03    Urinalysis:  Recent Labs  03/18/16 1932  COLORURINE STRAW*  LABSPEC 1.013  PHURINE 6.0  GLUCOSEU 50*  HGBUR MODERATE*  BILIRUBINUR NEGATIVE  KETONESUR NEGATIVE  PROTEINUR 30*  NITRITE NEGATIVE  LEUKOCYTESUR NEGATIVE      Imaging: US Biopsy-no Radiologist  Result Date: 03/18/2016 CLINICAL DATA:  PROTEINURIA, VASCULITIS EXAM: ULTRASOUND GUIDED RENAL BIOPSY COMPARISON:  Previous exam(s). FINDINGS: Informed written consent was obtained from the patient by Dr. Candiss Norse after a thorough discussion of the procedural risks, benefits and alternatives. All questions were addressed. Maximal Sterile Barrier Technique was utilized including caps, mask, sterile gowns, sterile gloves, sterile drape, hand hygiene and skin antiseptic. A timeout was performed prior to the initiation of the procedure. Left renal core biopsy was performed without a radiologist present. 4 core biopsies were obtained of the lower pole of the left kidney. No significant perinephric hematoma.  IMPRESSION: Left renal core biopsy without a radiologist present. No significant perinephric hemorrhage. Electronically Signed   By: Kathreen Devoid   On: 03/18/2016 10:59     Medications:    . hydrocortisone  25 mg Rectal BID  . insulin aspart  0-5 Units Subcutaneous QHS  . insulin aspart  0-9 Units Subcutaneous TID WC  . pantoprazole  40 mg Oral Daily  . sodium chloride flush  3 mL Intravenous Q12H   acetaminophen **OR** acetaminophen, albuterol, guaiFENesin, hydrOXYzine, ondansetron **OR** ondansetron (ZOFRAN) IV, sodium chloride flush  Assessment/ Plan:  61 y.o. female  with sleep apnea, hypertension, GERD, depression, anxiety, who was admitted to [facility] on 03/13/2016  1. Hemoptysis 2. Hematuria 3. Proteinuria 4. Positive P-ANCA 5. 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 Hyperglycemia from steroids  Renal biopsy was done on  1/2 Results awaited Discussed case with Dr. Jefm Bryant. Decided to hold off further cytoxan at present and await biopsy report. Also assess response to steroids. Prednisone 20 mg TID. Discussed with Dr Margaretmary Eddy to start Metformin 500 BID for blood sugar control. Further follow up as outpatient. Consider ACE-I or ARB as outpatient   Results for Cassandra [name], Cassandra [name] (MRN [id number]) as of 03/18/2016 09:25  Ref. Range 02/20/2016 17:02 03/14/2016 17:01  Anit Nuclear Antibody(ANA) Latest Ref Range: Negative  POS (A) Negative  ANA Pattern 1 Unknown HOMOGENEOUS (A)   ANA Titer 1 Latest Units: titer >=2:3557 (H)   Cyclic Citrullin Peptide Ab Latest Units: Units <16   ds DNA Ab Latest Units: IU/mL 2   GBM Ab Interp Latest Ref Range: <1.0 AI  <1.0   Myeloperoxidase Ab Latest Ref Range: 0.0 - 9.0 U/mL >100.0 (H)   Serine Protease 3 Latest Ref Range: <1.0 AI  <1.0   RA Latex Turbid. Latest Ref Range: <14 IU/mL <14   P-ANCA Latest Ref Range: <1:20  1:160 (H)   C3 Complement Latest Ref Range: 82 - 167 mg/dL  158  Complement C4, Body Fluid Latest Ref Range: 14 - 44 mg/dL  21  Scleroderma (Scl-70) (ENA) Antibody, IgG Latest Ref Range: <1.0 NEG AI  <1.0 NEG      LOS: 6 Cassandra [name] 1/3/201811:42 AM

## 2016-03-19 NOTE — Discharge Summary (Addendum)
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 Allergy   . Anxiety   . Complication of anesthesia    difficulty waking up  . Depression   . Dysrhythmia    svt  . GERD (gastroesophageal reflux disease)   . Heart murmur    never seen cardiologist for this  . Leukocytoclastic vasculitis (HCC)    Dr. Marchelle Gearing and Dr. Corliss Skains  . Migraines    maybe one a year  . Sleep apnea    cpap  . SVT (supraventricular tachycardia) (HCC)   . Wegner's disease (congenital syphilitic osteochondritis)     HOSPITAL COURSE:  Brief history and physical: Roosevelt Bisher  is a 61 y.o. female with a known history of Wegner's disease, allergy, anxiety and GERD. The patient has had back pain and hematuria for the past 2 weeks. She also has persistent cough and developed hematemesis for the past 2 days. She is taking prednisone which was prescribed by pulmonary physician. But she states that after about 1 week on the prednisone she felt like the dose was too high because "my tongue was sizzling" so she decreased to 30 mg and then has not had any today or yesterday. She also complains of one episode diarrhea with bloody stool yesterday. Dr. Sung Amabile suggested admitting patient and start IV Solu-Medrol. Please review history and physical for complete details  Hospital course  * Pulmonary ANCA vasculitis: with Hemoptysis, hematuria and rash -  reports improved hemoptysis, but resolved hematuria and rash - Patient was given IV Solu-Medrol every 6 hours, NEBwhen necessary during the hospital course. We will discharge her home with the by mouth prednisone 20 mg 3 times a day -Patient had left kidney lower pole core biopsy at 2 places by Dr. Thedore Mins 03/18/2016. Patient tolerated the procedure well. Hemoglobin remained the same Cytoxan by mouth initiatedOn 03/15/2016 during the hospital course. This will be discontinued at the time of discharge until we get the biopsy results and patient is seen by rheumatology and nephrology -Sedimentation rate 48 - Appreciate nephrology , pulmonology and rheumatology recommendations - continue robitussion prn for cough  -Hepatitis B surface antigen negative -Hep C virus not detected Repeat chest x-ray with mild interstitial edema and cardiomegaly  * steroid induced hyperglycemia- pt is started on metformin, nephro to monitor renal function  * Leukocytosis. The wbc 12.3-18.9 Possible due to reactive and  steroid. Monitor CBC.  * Dehydration: Improved with IV fluids and monitor BMP.  * GERD: continue protonix     DISCHARGE CONDITIONS:   Stable  CONSULTS OBTAINED:     PROCEDURES Left kidney renal biopsy on 03/18/2016  DRUG ALLERGIES:   Allergies  Allergen Reactions  . Ampicillin     Rash   . Clarithromycin     unknown  . Codeine     Head feels like its crawling  . Effexor [Venlafaxine]     Hives   . Flagyl [Metronidazole]  unknown  . Fluoxetine Hcl Itching    Deep Itch   . Myrbetriq [Mirabegron]     Caused back pai, dry mouth  . Nsaids Other (See Comments)    Hypersensitivity vasculitis  . Paxil [Paroxetine]   . Reglan [Metoclopramide]     Blister in mouth   . Sulfa Antibiotics     Stomach irritation  . Tetracycline     Severe stomach pain    DISCHARGE MEDICATIONS:   Current Discharge Medication List    START taking these medications   Details   acetaminophen (TYLENOL) 325 MG tablet Take 2 tablets (650 mg total) by mouth every 6 (six) hours as needed for mild pain (or Fever >/= 101).    guaiFENesin (ROBITUSSIN) 100 MG/5ML SOLN Take 10 mLs (200 mg total) by mouth every 6 (six) hours as needed for cough or to loosen phlegm. Qty: 1200 mL, Refills: 0    hydrocortisone (ANUSOL-HC) 25 MG suppository Place 1 suppository (25 mg total) rectally 2 (two) times daily. Qty: 12 suppository, Refills: 0    hydrOXYzine (ATARAX/VISTARIL) 25 MG tablet Take 1 tablet (25 mg total) by mouth 3 (three) times daily as needed for itching. Qty: 30 tablet, Refills: 0    metFORMIN (GLUCOPHAGE) 500 MG tablet Take 1 tablet (500 mg total) by mouth 2 (two) times daily with a meal. Qty: 60 tablet, Refills: 0    prednisoLONE 5 MG TABS tablet Take 4 tablets (20 mg total) by mouth 3 (three) times daily. take 4 tablets by mouth 3 times a day Qty: 100 tablet, Refills: 0      CONTINUE these medications which have NOT CHANGED   Details  esomeprazole (NEXIUM) 40 MG capsule Take 1 capsule (40 mg total) by mouth daily at 12 noon. Qty: 30 capsule, Refills: 2    sertraline (ZOLOFT) 100 MG tablet TAKE 1 TABLET BY MOUTH EVERY NIGHT AT BEDTIME Qty: 90 tablet, Refills: 3      STOP taking these medications     diltiazem (CARDIZEM) 30 MG tablet      predniSONE (DELTASONE) 20 MG tablet          DISCHARGE INSTRUCTIONS:   Follow-up with nephrology Dr. Vennie Homans 2 on 03/25/2016 at 9:30 AM Follow-up with Dr. Jodie Echevaria rheumatology in one week Follow-up with primary care physician in a week   DIET:  Regular diet  DISCHARGE CONDITION:  Stable  ACTIVITY:  Activity as tolerated  OXYGEN:  Home Oxygen: No.   Oxygen Delivery: room air  DISCHARGE LOCATION:  home   If you experience worsening of your admission symptoms, develop shortness of breath, life threatening emergency, suicidal or homicidal thoughts you must seek medical attention immediately by calling  911 or calling your MD immediately  if symptoms less severe.  You Must read complete instructions/literature along with all the possible adverse reactions/side effects for all the Medicines you take and that have been prescribed to you. Take any new Medicines after you have completely understood and accpet all the possible adverse reactions/side effects.   Please note  You were cared for by a hospitalist during your hospital stay. If you have any questions about your discharge medications or the care you received while you were in the hospital after you are discharged, you can call the unit and asked to speak with the hospitalist on call if the hospitalist that took care of you is not available. Once you are discharged, your primary care physician will handle any further medical issues. Please note that NO  REFILLS for any discharge medications will be authorized once you are discharged, as it is imperative that you return to your primary care physician (or establish a relationship with a primary care physician if you do not have one) for your aftercare needs so that they can reassess your need for medications and monitor your lab values.     Today  Chief Complaint  Patient presents with  . Back Pain  . Hematuria   Patient is doing fine. Denies any pain in the left flank area where patient had kidney biopsy   ROS:  CONSTITUTIONAL: Denies fevers, chills. Denies any fatigue, weakness.  EYES: Denies blurry vision, double vision, eye pain. EARS, NOSE, THROAT: Denies tinnitus, ear pain, hearing loss. RESPIRATORY: Denies cough, wheeze, shortness of breath.  CARDIOVASCULAR: Denies chest pain, palpitations, edema.  GASTROINTESTINAL: Denies nausea, vomiting, diarrhea, abdominal pain. Denies bright red blood per rectum. GENITOURINARY: Denies dysuria, hematuria. ENDOCRINE: Denies nocturia or thyroid problems. HEMATOLOGIC AND LYMPHATIC: Denies easy bruising or bleeding. SKIN: Denies rash or  lesion. MUSCULOSKELETAL: Denies pain in neck, back, shoulder, knees, hips or arthritic symptoms.  NEUROLOGIC: Denies paralysis, paresthesias.  PSYCHIATRIC: Denies anxiety or depressive symptoms.   VITAL SIGNS:  Blood pressure (!) 156/82, pulse (!) 56, temperature 97.6 F (36.4 C), temperature source Oral, resp. rate 20, height 5\' 3"  (1.6 m), weight 112.1 kg (247 lb 3 oz), SpO2 95 %.  I/O:    Intake/Output Summary (Last 24 hours) at 03/19/16 1035 Last data filed at 03/19/16 0800  Gross per 24 hour  Intake           644.17 ml  Output              200 ml  Net           444.17 ml    PHYSICAL EXAMINATION:  GENERAL:  61 y.o.-year-old patient lying in the bed with no acute distress.  EYES: Pupils equal, round, reactive to light and accommodation. No scleral icterus. Extraocular muscles intact.  HEENT: Head atraumatic, normocephalic. Oropharynx and nasopharynx clear.  NECK:  Supple, no jugular venous distention. No thyroid enlargement, no tenderness.  LUNGS: Normal breath sounds bilaterally, no wheezing, rales,rhonchi or crepitation. No use of accessory muscles of respiration.  CARDIOVASCULAR: S1, S2 normal. No murmurs, rubs, or gallops.  ABDOMEN: Soft, non-tender, non-distended. Bowel sounds present. No organomegaly or mass. Left flank area status post renal biopsy, clean dressing EXTREMITIES: No pedal edema, cyanosis, or clubbing.  NEUROLOGIC: Cranial nerves II through XII are intact. Muscle strength 5/5 in all extremities. Sensation intact. Gait not checked.  PSYCHIATRIC: The patient is alert and oriented x 3.  SKIN: No obvious rash, lesion, or ulcer.   DATA REVIEW:   CBC  Recent Labs Lab 03/19/16 0615  WBC 17.4*  HGB 12.2  HCT 36.1  PLT 243    Chemistries   Recent Labs Lab 03/13/16 1837  03/19/16 0615  NA 135  < > 134*  K 3.9  < > 4.8  CL 97*  < > 100*  CO2 29  < > 28  GLUCOSE 106*  < > 209*  BUN 25*  < > 40*  CREATININE 0.99  < > 1.16*  CALCIUM 8.7*  < > 8.8*   AST 21  --   --   ALT 27  --   --   ALKPHOS 84  --   --   BILITOT 0.5  --   --   < > = values in this interval not  displayed.  Cardiac Enzymes No results for input(s): TROPONINI in the last 168 hours.  Microbiology Results  Results for orders placed or performed during the hospital encounter of 09/28/15  Urine culture     Status: Abnormal   Collection Time: 09/28/15  9:29 AM  Result Value Ref Range Status   Specimen Description URINE, CLEAN CATCH  Final   Special Requests NONE  Final   Culture >=100,000 COLONIES/mL ESCHERICHIA COLI (A)  Final   Report Status 09/30/2015 FINAL  Final   Organism ID, Bacteria ESCHERICHIA COLI (A)  Final      Susceptibility   Escherichia coli - MIC*    AMPICILLIN <=2 SENSITIVE Sensitive     CEFAZOLIN <=4 SENSITIVE Sensitive     CEFTRIAXONE <=1 SENSITIVE Sensitive     CIPROFLOXACIN <=0.25 SENSITIVE Sensitive     GENTAMICIN >=16 RESISTANT Resistant     IMIPENEM <=0.25 SENSITIVE Sensitive     NITROFURANTOIN <=16 SENSITIVE Sensitive     TRIMETH/SULFA <=20 SENSITIVE Sensitive     AMPICILLIN/SULBACTAM <=2 SENSITIVE Sensitive     PIP/TAZO <=4 SENSITIVE Sensitive     * >=100,000 COLONIES/mL ESCHERICHIA COLI    RADIOLOGY:  Dg Chest 2 View  Result Date: 03/16/2016 CLINICAL DATA:  Productive cough and chest tightness. EXAM: CHEST  2 VIEW COMPARISON:  June 17, 2013 FINDINGS: The mediastinal contour is normal. The heart size is mildly enlarged. There is mild diffuse increased bilateral pulmonary interstitium. There is no pleural effusion. The visualized skeletal structures are stable. IMPRESSION: Cardiomegaly with mild interstitial edema. Electronically Signed   By: Sherian ReinWei-Chen  Lin M.D.   On: 03/16/2016 15:25   [place] Biopsy-no Radiologist  Result Date: 03/18/2016 CLINICAL DATA:  PROTEINURIA, VASCULITIS EXAM: ULTRASOUND GUIDED RENAL BIOPSY COMPARISON:  Previous exam(s). FINDINGS: Informed written consent was obtained from the patient by Dr. Thedore MinsSingh after a thorough  discussion of the procedural risks, benefits and alternatives. All questions were addressed. Maximal Sterile Barrier Technique was utilized including caps, mask, sterile gowns, sterile gloves, sterile drape, hand hygiene and skin antiseptic. A timeout was performed prior to the initiation of the procedure. Left renal core biopsy was performed without a radiologist present. 4 core biopsies were obtained of the lower pole of the left kidney. No significant perinephric hematoma. IMPRESSION: Left renal core biopsy without a radiologist present. No significant perinephric hemorrhage. Electronically Signed   By: Elige KoHetal  Patel   On: 03/18/2016 10:59    EKG:   Orders placed or performed in visit on 02/17/14  . EKG 12-Lead      Management plans discussed with the patient, family and they are in agreement.  CODE STATUS:     Code Status Orders        Start     Ordered   03/13/16 2141  Full code  Continuous     03/13/16 2140    Code Status History    Date Active Date Inactive Code Status Order ID Comments User Context   02/21/2014  3:58 PM 02/24/2014  7:14 PM Full Code [id number]  Drema HalonAndrew Paul Nida, PA-C Inpatient      TOTAL TIME TAKING CARE OF THIS PATIENT: 45 minutes.   Note: This dictation was prepared with Dragon dictation along with smaller phrase technology. Any transcriptional errors that result from this process are unintentional.   @MEC @  on 03/19/2016 at 10:35 AM  Between 7am to 6pm - Pager - [phone]  After 6pm go to [email or web address] - password [email or web address]  Office  [phone]  CC: Primary care physician; Illa Level, NP

## 2016-03-19 NOTE — Progress Notes (Signed)
Received MD order to discharge patient to home, reviewed discharge instructions home meds and prescriptions with patient and she verbalized understanding discharged to home in wheelchair with nursing staff

## 2016-03-19 NOTE — Discharge Instructions (Signed)
Follow-up with nephrology Dr. Vennie Homansalderwood 2 on 03/25/2016 at 9:30 AM Follow-up with Dr. Jodie EchevariaKernodel rheumatology in one week Follow-up with primary care physician in a week

## 2016-03-25 LAB — SURGICAL PATHOLOGY

## 2016-04-01 ENCOUNTER — Ambulatory Visit: Payer: Medicare HMO | Admitting: Internal Medicine

## 2016-04-07 ENCOUNTER — Encounter: Payer: Self-pay | Admitting: Nephrology

## 2016-04-15 ENCOUNTER — Encounter (HOSPITAL_COMMUNITY): Payer: Self-pay | Admitting: Emergency Medicine

## 2016-04-15 ENCOUNTER — Emergency Department (HOSPITAL_COMMUNITY)
Admission: EM | Admit: 2016-04-15 | Discharge: 2016-04-16 | Disposition: A | Discharge status: Home / Self Care | Payer: Medicare HMO | Attending: Emergency Medicine | Admitting: Emergency Medicine

## 2016-04-15 DIAGNOSIS — Z79899 Other long term (current) drug therapy: Secondary | ICD-10-CM | POA: Diagnosis not present

## 2016-04-15 DIAGNOSIS — Z96653 Presence of artificial knee joint, bilateral: Secondary | ICD-10-CM | POA: Insufficient documentation

## 2016-04-15 DIAGNOSIS — I471 Supraventricular tachycardia: Secondary | ICD-10-CM | POA: Insufficient documentation

## 2016-04-15 DIAGNOSIS — R002 Palpitations: Secondary | ICD-10-CM | POA: Diagnosis present

## 2016-04-15 LAB — BASIC METABOLIC PANEL
ANION GAP: 18 — AB (ref 5–15)
BUN: 17 mg/dL (ref 6–20)
CO2: 19 mmol/L — ABNORMAL LOW (ref 22–32)
Calcium: 9.3 mg/dL (ref 8.9–10.3)
Chloride: 100 mmol/L — ABNORMAL LOW (ref 101–111)
Creatinine, Ser: 1.14 mg/dL — ABNORMAL HIGH (ref 0.44–1.00)
GFR calc Af Amer: 59 mL/min — ABNORMAL LOW (ref >60)
GFR, EST NON AFRICAN AMERICAN: 51 mL/min — AB (ref >60)
Glucose, Bld: 269 mg/dL — ABNORMAL HIGH (ref 65–99)
POTASSIUM: 4.3 mmol/L (ref 3.5–5.1)
SODIUM: 137 mmol/L (ref 135–145)

## 2016-04-15 LAB — CBC
HEMATOCRIT: 36.5 % (ref 36.0–46.0)
Hemoglobin: 12.2 g/dL (ref 12.0–15.0)
MCH: 29.3 pg (ref 26.0–34.0)
MCHC: 33.4 g/dL (ref 30.0–36.0)
MCV: 87.7 fL (ref 78.0–100.0)
Platelets: 175 10*3/uL (ref 150–400)
RBC: 4.16 MIL/uL (ref 3.87–5.11)
RDW: 16.4 % — AB (ref 11.5–15.5)
WBC: 11.6 10*3/uL — AB (ref 4.0–10.5)

## 2016-04-15 LAB — CBG MONITORING, ED: Glucose-Capillary: 272 mg/dL — ABNORMAL HIGH (ref 65–99)

## 2016-04-15 LAB — I-STAT TROPONIN, ED: Troponin i, poc: 0.02 ng/mL (ref 0.00–0.08)

## 2016-04-15 LAB — TSH: TSH: 1.161 u[IU]/mL (ref 0.350–4.500)

## 2016-04-15 LAB — I-STAT CG4 LACTIC ACID, ED: Lactic Acid, Venous: 5.39 mmol/L (ref 0.5–1.9)

## 2016-04-15 MED ORDER — ADENOSINE 6 MG/2ML IV SOLN
6.0000 mg | Freq: Once | INTRAVENOUS | Status: AC
  Administered 2016-04-15: 6 mg via INTRAVENOUS

## 2016-04-15 MED ORDER — SODIUM CHLORIDE 0.9 % IV BOLUS (SEPSIS)
1000.0000 mL | Freq: Once | INTRAVENOUS | Status: AC
  Administered 2016-04-15: 1000 mL via INTRAVENOUS

## 2016-04-15 MED ORDER — METOPROLOL TARTRATE 25 MG PO TABS: ORAL_TABLET | ORAL | Status: DC

## 2016-04-15 MED ORDER — ADENOSINE 6 MG/2ML IV SOLN: 6.0000 mg | Freq: Once | INTRAVENOUS | Status: DC

## 2016-04-15 MED ORDER — METOPROLOL TARTRATE 5 MG/5ML IV SOLN
INTRAVENOUS | Status: AC
  Filled 2016-04-15: qty 5

## 2016-04-15 MED ORDER — METOPROLOL TARTRATE 5 MG/5ML IV SOLN
5.0000 mg | Freq: Once | INTRAVENOUS | Status: AC
  Administered 2016-04-15: 5 mg via INTRAVENOUS

## 2016-04-15 NOTE — Discharge Instructions (Signed)
Stop lisinopril and take metoprolol

## 2016-04-15 NOTE — ED Triage Notes (Signed)
Pt was near syncope when sister got to visit her on her house pt very diaphoretic, started feeling hot and nauseated with CP, prior hx of SVT. Denies any pain on triage arrival

## 2016-04-15 NOTE — ED Provider Notes (Signed)
[facility]-EMERGENCY DEPT Provider Note   CSN: [id number] Arrival date & time: 04/15/16  2226     History   Chief Complaint Chief Complaint  Patient presents with  . Near Syncope  . Palpitations    HPI Cassandra [name] is a 61 y.o. female.  Pt presents to the ED tonight with near syncope.  Pt has a hx of prior SVT associated with prednisone.  Pt was hospitalized with pulmonary ANCA vasculitis in December.  She was placed on cytoxan and prednisone then, and has continued on it until July.  The pt feels like she is in SVT.      Past Medical History:  Diagnosis Date  . Allergy   . Anxiety   . Complication of anesthesia    difficulty waking up  . Depression   . Dysrhythmia    svt  . GERD (gastroesophageal reflux disease)   . Heart murmur    never seen cardiologist for this  . Leukocytoclastic vasculitis (HCC)    Dr. Marchelle Gearing and Dr. Corliss Skains  . Migraines    maybe one a year  . Sleep apnea    cpap  . SVT (supraventricular tachycardia) (HCC)   . Wegner's disease (congenital syphilitic osteochondritis)     Patient Active Problem List   Diagnosis Date Noted  . Hemoptysis 03/16/2016  . Positive ANA (antinuclear antibody) 03/15/2016  . 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 History of gross hematuria 02/20/2016  . Primary osteoarthritis of left knee 02/21/2014  . Obesity 02/21/2014  . Primary osteoarthritis of knee 02/21/2014  . SVT (supraventricular tachycardia) (HCC) 02/17/2014  . Leukocytoclastic vasculitis (HCC)   . 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 Anxiety   . Depression   . GERD (gastroesophageal reflux disease)   . Dyspnea and respiratory abnormality 01/03/2009  . HEPATITIS C 12/07/2008  . VASCULITIS 12/07/2008  . MICROSCOPIC HEMATURIA 12/07/2008  . HEADACHE, CHRONIC 11/30/2008  . COUGH 11/30/2008    Past Surgical History:  Procedure Laterality Date  . BUNIONECTOMY Bilateral   . CARPAL TUNNEL RELEASE Right 2002  . JOINT REPLACEMENT Right     knee  . TONSILLECTOMY  08/2002  . TOTAL KNEE ARTHROPLASTY Right   . TOTAL KNEE ARTHROPLASTY Left 02/21/2014   Procedure: LEFT TOTAL KNEE ARTHROPLASTY;  Surgeon: Velna Ochs, MD;  Location: [facility] OR;  Service: Orthopedics;  Laterality: Left;  . TUBAL LIGATION  1980    OB History    No data available       Home Medications    Prior to Admission medications   Medication Sig Start Date End Date Taking? Authorizing Provider  cyclophosphamide (CYTOXAN) 50 MG capsule Take 150 mg by mouth daily. 03/27/16  Yes Historical Provider, MD  esomeprazole (NEXIUM) 40 MG capsule Take 1 capsule (40 mg total) by mouth daily at 12 noon. 05/24/13  Yes Nyoka Cowden, MD  metFORMIN (GLUCOPHAGE) 500 MG tablet Take 1 tablet (500 mg total) by mouth 2 (two) times daily with a meal. 03/19/16  Yes Ramonita Lab, MD  predniSONE (DELTASONE) 10 MG tablet Take 60 mg by mouth daily. 04/01/16  Yes Historical Provider, MD  sertraline (ZOLOFT) 100 MG tablet TAKE 1 TABLET BY MOUTH EVERY NIGHT AT BEDTIME 01/31/14  Yes Donita Brooks, MD  VESICARE 5 MG tablet Take 5 mg by mouth daily. 04/14/16  Yes Historical Provider, MD  acetaminophen (TYLENOL) 325 MG tablet Take 2 tablets (650 mg total) by mouth every 6 (six) hours as needed for mild pain (or Fever >/=  101). Patient not taking: Reported on 04/15/2016 03/19/16   Ramonita LabAruna Gouru, MD  guaiFENesin (ROBITUSSIN) 100 MG/5ML SOLN Take 10 mLs (200 mg total) by mouth every 6 (six) hours as needed for cough or to loosen phlegm. Patient not taking: Reported on 04/15/2016 03/19/16   Ramonita LabAruna Gouru, MD  hydrocortisone (ANUSOL-HC) 25 MG suppository Place 1 suppository (25 mg total) rectally 2 (two) times daily. Patient not taking: Reported on 04/15/2016 03/19/16   Ramonita LabAruna Gouru, MD  hydrOXYzine (ATARAX/VISTARIL) 25 MG tablet Take 1 tablet (25 mg total) by mouth 3 (three) times daily as needed for itching. Patient not taking: Reported on 04/15/2016 03/19/16   Ramonita LabAruna Gouru, MD  metoprolol (LOPRESSOR) 25 MG  tablet Take 1 tablet twice a day 04/15/16   Jacalyn LefevreJulie Karel Turpen, MD  prednisoLONE 5 MG TABS tablet Take 4 tablets (20 mg total) by mouth 3 (three) times daily. take 4 tablets by mouth 3 times a day Patient not taking: Reported on 04/15/2016 03/19/16   Ramonita LabAruna Gouru, MD    Family History Family History  Problem Relation Age of Onset  . Clotting disorder Mother 2965    Blood clot, foot amputation  . Cervical cancer Mother   . Arthritis Sister   . Heart attack Maternal Grandmother 51  . Breast cancer Sister   . Asthma Cousin     Social History Social History  Substance Use Topics  . Smoking status: Never Smoker  . Smokeless tobacco: Never Used  . Alcohol use No     Allergies   Ampicillin; Clarithromycin; Codeine; Effexor [venlafaxine]; Flagyl [metronidazole]; Fluoxetine hcl; Myrbetriq [mirabegron]; Nsaids; Paxil [paroxetine]; Reglan [metoclopramide]; Sulfa antibiotics; and Tetracycline   Review of Systems Review of Systems  Cardiovascular: Positive for palpitations.  All other systems reviewed and are negative.    Physical Exam Updated Vital Signs BP 110/70   Pulse 88   Temp 99.3 F (37.4 C) (Rectal)   Resp 24   Ht 5\' 2"  (1.575 m)   Wt 230 lb (104.3 kg)   SpO2 99%   BMI 42.07 kg/m   Physical Exam  Constitutional: She is oriented to person, place, and time. She appears well-developed. She appears distressed.  HENT:  Head: Normocephalic and atraumatic.  Right Ear: External ear normal.  Left Ear: External ear normal.  Nose: Nose normal.  Mouth/Throat: Oropharynx is clear and moist.  Eyes: Conjunctivae and EOM are normal. Pupils are equal, round, and reactive to light.  Neck: Normal range of motion. Neck supple.  Cardiovascular: Regular rhythm, normal heart sounds and intact distal pulses.  Tachycardia present.   Pulmonary/Chest: Effort normal and breath sounds normal.  Abdominal: Soft. Bowel sounds are normal.  Musculoskeletal: Normal range of motion.  Neurological: She  is alert and oriented to person, place, and time.  Skin: Skin is warm and dry.  Psychiatric: She has a normal mood and affect. Her behavior is normal. Judgment and thought content normal.  Nursing note and vitals reviewed.    ED Treatments / Results  Labs (all labs ordered are listed, but only abnormal results are displayed) Labs Reviewed  BASIC METABOLIC PANEL - Abnormal; Notable for the following:       Result Value   Chloride 100 (*)    CO2 19 (*)    Glucose, Bld 269 (*)    Creatinine, Ser 1.14 (*)    GFR calc non Af Amer 51 (*)    GFR calc Af Amer 59 (*)    Anion gap 18 (*)    All  other components within normal limits  CBC - Abnormal; Notable for the following:    WBC 11.6 (*)    RDW 16.4 (*)    All other components within normal limits  CBG MONITORING, ED - Abnormal; Notable for the following:    Glucose-Capillary 272 (*)    All other components within normal limits  I-STAT CG4 LACTIC ACID, ED - Abnormal; Notable for the following:    Lactic Acid, Venous 5.39 (*)    All other components within normal limits  URINALYSIS, ROUTINE W REFLEX MICROSCOPIC  TSH  I-STAT TROPOININ, ED    EKG  EKG Interpretation  Date/Time:  Tuesday April 15 2016 22:51:36 EST Ventricular Rate:  105 PR Interval:    QRS Duration: 67 QT Interval:  303 QTC Calculation: 401 R Axis:   13 Text Interpretation:  Sinus tachycardia Abnormal R-wave progression, early transition LVH by voltage after 6 mg of adenosine given Confirmed by Taraya Steward MD, Dnyla Antonetti ([id number]) on 04/15/2016 10:57:42 PM       Radiology No results found.  Procedures Procedures (including critical care time)  Medications Ordered in ED Medications  adenosine (ADENOCARD) 6 MG/2ML injection 6 mg (6 mg Intravenous Given 04/15/16 2251)  metoprolol (LOPRESSOR) injection 5 mg (5 mg Intravenous Given 04/15/16 2254)  sodium chloride 0.9 % bolus 1,000 mL (1,000 mLs Intravenous New Bag/Given 04/15/16 2257)     Initial Impression /  Assessment and Plan / ED Course  I have reviewed the triage vital signs and the nursing notes.  Pertinent labs & imaging results that were available during my care of the patient were reviewed by me and considered in my medical decision making (see chart for details).    SVT broke with 6 mg of adenosine.  Pt also given 5 mg of lopressor Iv.  She had an elevated lactic acid, but that is due to the elevation in hr.  She was given IVFs.  Pt's bp med will be changed from lisinopril to lopressor.  She is given the number of cards to f/u.  She knows to return if worse.  Final Clinical Impressions(s) / ED Diagnoses   Final diagnoses:  SVT (supraventricular tachycardia) (HCC)    New Prescriptions New Prescriptions   METOPROLOL (LOPRESSOR) 25 MG TABLET    Take 1 tablet twice a day     Jacalyn Lefevre, MD 04/15/16 2349

## 2016-05-07 DIAGNOSIS — E1129 Type 2 diabetes mellitus with other diabetic kidney complication: Secondary | ICD-10-CM | POA: Insufficient documentation

## 2016-05-07 DIAGNOSIS — N057 Unspecified nephritic syndrome with diffuse crescentic glomerulonephritis: Secondary | ICD-10-CM | POA: Insufficient documentation

## 2016-05-07 DIAGNOSIS — N058 Unspecified nephritic syndrome with other morphologic changes: Secondary | ICD-10-CM

## 2016-05-07 HISTORY — DX: Type 2 diabetes mellitus with other diabetic kidney complication: E11.29

## 2016-06-30 ENCOUNTER — Encounter: Payer: Self-pay | Admitting: Cardiovascular Disease

## 2016-06-30 ENCOUNTER — Ambulatory Visit (INDEPENDENT_AMBULATORY_CARE_PROVIDER_SITE_OTHER): Payer: Medicare HMO | Admitting: Cardiovascular Disease

## 2016-06-30 ENCOUNTER — Encounter (INDEPENDENT_AMBULATORY_CARE_PROVIDER_SITE_OTHER): Payer: Self-pay

## 2016-06-30 VITALS — BP 110/80 | HR 85 | Ht 62.0 in | Wt 237.1 lb

## 2016-06-30 DIAGNOSIS — I471 Supraventricular tachycardia: Secondary | ICD-10-CM | POA: Diagnosis not present

## 2016-06-30 NOTE — Patient Instructions (Signed)
Medication Instructions:  Your physician recommends that you continue on your current medications as directed. Please refer to the Current Medication list given to you today.   Labwork: None Ordered   Testing/Procedures: None Ordered   Follow-Up: Your physician wants you to follow-up in: 6 months with Dr. [name].  You will receive a reminder letter in the mail two months in advance. If you don't receive a letter, please call our office to schedule the follow-up appointment.   If you need a refill on your cardiac medications before your next appointment, please call your pharmacy.   Thank you for choosing [facility]! Paeton Studer, RN [phone]    

## 2016-06-30 NOTE — Progress Notes (Signed)
[name] Date of Birth  26-Nov-1955       [facility] Office [address]  [address] [place], [place]  [address]   [place], [place]  [address] [phone]     [phone]   Fax  [phone]     Fax [phone]  Problem List: 1. Palpitations 2. Knee pain - needs L knee replacement.   Has had right TKA .   History of Present Illness:  Cassandra [name] is a 40 with hx of SVT.  She had a prolonged episode of SVT in April , 2015 requiring a visit to the ED .  She received IV adenosine with resolution of the SVT.  She now takes PRN Diltiazem. She has not had any severe episodes since then .    Non smoker No ETOH, Fhx:   Maternal grandparents died of MI.   Jul 24, 2016:  Cassandra [name] is seen today for a follow up for a recent ER visit for SVT.  Presented to the ER with SVT - HR of 236.   She had tunnel vision and near syncope.   Resolved with IV adenosine  She has felt short of breath since that time .  She was started on Metoprolol 25 mg BID and has been feeling better   Current Outpatient Prescriptions on File Prior to Visit  Medication Sig Dispense Refill  . cyclophosphamide (CYTOXAN) 50 MG capsule Take 150 mg by mouth daily.    . hydrocortisone (ANUSOL-HC) 25 MG suppository Place 1 suppository (25 mg total) rectally 2 (two) times daily. 12 suppository 0  . hydrOXYzine (ATARAX/VISTARIL) 25 MG tablet Take 1 tablet (25 mg total) by mouth 3 (three) times daily as needed for itching. 30 tablet 0  . metFORMIN (GLUCOPHAGE) 500 MG tablet Take 1 tablet (500 mg total) by mouth 2 (two) times daily with a meal. 60 tablet 0  . metoprolol (LOPRESSOR) 25 MG tablet Take 1 tablet twice a day 60 tablet o  . prednisoLONE 5 MG TABS tablet Take 4 tablets (20 mg total) by mouth 3 (three) times daily. take 4 tablets by mouth 3 times a day 100 tablet 0  . predniSONE (DELTASONE) 10 MG tablet Take 60 mg by mouth daily.    . sertraline (ZOLOFT) 100 MG  tablet TAKE 1 TABLET BY MOUTH EVERY NIGHT AT BEDTIME 90 tablet 3  . VESICARE 5 MG tablet Take 5 mg by mouth daily.     No current facility-administered medications on file prior to visit.     Allergies  Allergen Reactions  . Ampicillin     Rash   . Clarithromycin     unknown  . Codeine     Head feels like its crawling  . Effexor [Venlafaxine]     Hives   . Flagyl [Metronidazole]     unknown  . Fluoxetine Hcl Itching    Deep Itch   . Myrbetriq [Mirabegron]     Caused back pai, dry mouth  . Nsaids Other (See Comments)    Hypersensitivity vasculitis  . Paxil [Paroxetine]   . Reglan [Metoclopramide]     Blister in mouth   . Sulfa Antibiotics     Stomach irritation  . Tetracycline     Severe stomach pain    Past Medical History:  Diagnosis Date  . Allergy   . Anxiety   . Complication of anesthesia    difficulty waking up  . Depression   .  Dysrhythmia    svt  . GERD (gastroesophageal reflux disease)   . Heart murmur    never seen cardiologist for this  . Leukocytoclastic vasculitis (HCC)    Dr. Marchelle Gearing and Dr. Corliss Skains  . Migraines    maybe one a year  . Sleep apnea    cpap  . SVT (supraventricular tachycardia) (HCC)   . Wegner's disease (congenital syphilitic osteochondritis)     Past Surgical History:  Procedure Laterality Date  . BUNIONECTOMY Bilateral   . CARPAL TUNNEL RELEASE Right 2002  . JOINT REPLACEMENT Right    knee  . TONSILLECTOMY  08/2002  . TOTAL KNEE ARTHROPLASTY Right   . TOTAL KNEE ARTHROPLASTY Left 02/21/2014   Procedure: LEFT TOTAL KNEE ARTHROPLASTY;  Surgeon: Velna Ochs, MD;  Location: [facility] OR;  Service: Orthopedics;  Laterality: Left;  . TUBAL LIGATION  1980    History  Smoking Status  . Never Smoker  Smokeless Tobacco  . Never Used    History  Alcohol Use No    Family History  Problem Relation Age of Onset  . Clotting disorder Mother 81    Blood clot, foot amputation  . Cervical cancer Mother   . Arthritis  Sister   . Heart attack Maternal Grandmother 51  . Breast cancer Sister   . Asthma Cousin     Reviw of Systems:  Reviewed in the HPI.  All other systems are negative.  Physical Exam: Blood pressure 110/80, pulse 85, height  (1.575 m), weight 237 lb 1.9 oz (107.6 kg), SpO2 90 %. Wt Readings from Last 3 Encounters:  06/30/16 237 lb 1.9 oz (107.6 kg)  04/15/16 230 lb (104.3 kg)  03/13/16 247 lb 3 oz (112.1 kg)     General: Well developed, well nourished, in no acute distress.  Head: Normocephalic, atraumatic, sclera non-icteric, mucus membranes are moist,   Neck: Supple. Carotids are 2 + without bruits. No JVD   Lungs: Clear   Heart: RR, normal S1S2, no significant murmurs  Abdomen: Soft, non-tender, non-distended with normal bowel sounds.obese   Msk:  Strength and tone are normal   Extremities: No clubbing or cyanosis. No edema.  Distal pedal pulses are 2+ and equal    Neuro: CN II - XII intact.  Alert and oriented X 3. , walking is difficult because of her knee pain.   Psych:  Normal   ECG: June 30, 2016 NSR at 85.   NS ST abn.   A/P  1. SVT : Cassandra [name] seems to be doing quite a bit better on the metoprolol 25 mg twice a day. Her SVT was very rapid-236 bpm. If she has recurrent episodes of SVT, I would have her very low threshold to refer her for SVT ablation. At present she seems to be fairly stable and does not want have any procedures at this time.     Cassandra Miss, MD  06/30/2016 10:16 AM    [facility] [address] [place],  [address] [place], [place]  [address] Pager [phone] Phone: [phone]; Fax: [phone]

## 2016-07-08 ENCOUNTER — Encounter (HOSPITAL_COMMUNITY): Payer: Self-pay

## 2016-07-08 ENCOUNTER — Inpatient Hospital Stay (HOSPITAL_COMMUNITY)
Admission: EM | Admit: 2016-07-08 | Discharge: 2016-07-23 | DRG: 270 | Disposition: A | Discharge status: Home Health | Payer: Medicare HMO | Attending: Internal Medicine | Admitting: Internal Medicine

## 2016-07-08 ENCOUNTER — Emergency Department (HOSPITAL_COMMUNITY): Payer: Medicare HMO

## 2016-07-08 DIAGNOSIS — D696 Thrombocytopenia, unspecified: Secondary | ICD-10-CM

## 2016-07-08 DIAGNOSIS — F329 Major depressive disorder, single episode, unspecified: Secondary | ICD-10-CM | POA: Diagnosis present

## 2016-07-08 DIAGNOSIS — N179 Acute kidney failure, unspecified: Secondary | ICD-10-CM | POA: Diagnosis present

## 2016-07-08 DIAGNOSIS — I471 Supraventricular tachycardia: Secondary | ICD-10-CM | POA: Diagnosis present

## 2016-07-08 DIAGNOSIS — Z794 Long term (current) use of insulin: Secondary | ICD-10-CM

## 2016-07-08 DIAGNOSIS — I2699 Other pulmonary embolism without acute cor pulmonale: Secondary | ICD-10-CM | POA: Diagnosis not present

## 2016-07-08 DIAGNOSIS — G4733 Obstructive sleep apnea (adult) (pediatric): Secondary | ICD-10-CM | POA: Diagnosis present

## 2016-07-08 DIAGNOSIS — I4581 Long QT syndrome: Secondary | ICD-10-CM | POA: Diagnosis present

## 2016-07-08 DIAGNOSIS — R768 Other specified abnormal immunological findings in serum: Secondary | ICD-10-CM | POA: Diagnosis not present

## 2016-07-08 DIAGNOSIS — R509 Fever, unspecified: Secondary | ICD-10-CM

## 2016-07-08 DIAGNOSIS — Z96653 Presence of artificial knee joint, bilateral: Secondary | ICD-10-CM | POA: Diagnosis present

## 2016-07-08 DIAGNOSIS — N183 Chronic kidney disease, stage 3 (moderate): Secondary | ICD-10-CM | POA: Diagnosis present

## 2016-07-08 DIAGNOSIS — R9431 Abnormal electrocardiogram [ECG] [EKG]: Secondary | ICD-10-CM | POA: Diagnosis present

## 2016-07-08 DIAGNOSIS — I82411 Acute embolism and thrombosis of right femoral vein: Secondary | ICD-10-CM | POA: Diagnosis not present

## 2016-07-08 DIAGNOSIS — I82441 Acute embolism and thrombosis of right tibial vein: Secondary | ICD-10-CM | POA: Diagnosis present

## 2016-07-08 DIAGNOSIS — I129 Hypertensive chronic kidney disease with stage 1 through stage 4 chronic kidney disease, or unspecified chronic kidney disease: Secondary | ICD-10-CM | POA: Diagnosis present

## 2016-07-08 DIAGNOSIS — Z6841 Body Mass Index (BMI) 40.0 and over, adult: Secondary | ICD-10-CM

## 2016-07-08 DIAGNOSIS — R0602 Shortness of breath: Secondary | ICD-10-CM

## 2016-07-08 DIAGNOSIS — Z7952 Long term (current) use of systemic steroids: Secondary | ICD-10-CM

## 2016-07-08 DIAGNOSIS — R5383 Other fatigue: Secondary | ICD-10-CM

## 2016-07-08 DIAGNOSIS — M7989 Other specified soft tissue disorders: Secondary | ICD-10-CM | POA: Diagnosis not present

## 2016-07-08 DIAGNOSIS — E860 Dehydration: Secondary | ICD-10-CM | POA: Diagnosis present

## 2016-07-08 DIAGNOSIS — D649 Anemia, unspecified: Secondary | ICD-10-CM | POA: Diagnosis present

## 2016-07-08 DIAGNOSIS — K219 Gastro-esophageal reflux disease without esophagitis: Secondary | ICD-10-CM | POA: Diagnosis present

## 2016-07-08 DIAGNOSIS — I82409 Acute embolism and thrombosis of unspecified deep veins of unspecified lower extremity: Secondary | ICD-10-CM

## 2016-07-08 DIAGNOSIS — T451X5A Adverse effect of antineoplastic and immunosuppressive drugs, initial encounter: Secondary | ICD-10-CM | POA: Diagnosis not present

## 2016-07-08 DIAGNOSIS — M3131 Wegener's granulomatosis with renal involvement: Secondary | ICD-10-CM | POA: Diagnosis present

## 2016-07-08 DIAGNOSIS — F419 Anxiety disorder, unspecified: Secondary | ICD-10-CM | POA: Diagnosis present

## 2016-07-08 DIAGNOSIS — I82431 Acute embolism and thrombosis of right popliteal vein: Secondary | ICD-10-CM | POA: Diagnosis present

## 2016-07-08 DIAGNOSIS — I82421 Acute embolism and thrombosis of right iliac vein: Secondary | ICD-10-CM | POA: Diagnosis present

## 2016-07-08 DIAGNOSIS — Z79899 Other long term (current) drug therapy: Secondary | ICD-10-CM

## 2016-07-08 DIAGNOSIS — R7689 Other specified abnormal immunological findings in serum: Secondary | ICD-10-CM | POA: Diagnosis present

## 2016-07-08 DIAGNOSIS — D61811 Other drug-induced pancytopenia: Secondary | ICD-10-CM | POA: Diagnosis not present

## 2016-07-08 DIAGNOSIS — E876 Hypokalemia: Secondary | ICD-10-CM | POA: Diagnosis not present

## 2016-07-08 DIAGNOSIS — R0902 Hypoxemia: Secondary | ICD-10-CM

## 2016-07-08 HISTORY — DX: Essential (primary) hypertension: I10

## 2016-07-08 HISTORY — DX: Disorder of kidney and ureter, unspecified: N28.9

## 2016-07-08 LAB — URINALYSIS, ROUTINE W REFLEX MICROSCOPIC
BACTERIA UA: NONE SEEN
Bilirubin Urine: NEGATIVE
Glucose, UA: NEGATIVE mg/dL
KETONES UR: NEGATIVE mg/dL
Leukocytes, UA: NEGATIVE
Nitrite: NEGATIVE
Protein, ur: 30 mg/dL — AB
Specific Gravity, Urine: 1.033 — ABNORMAL HIGH (ref 1.005–1.030)
pH: 5 (ref 5.0–8.0)

## 2016-07-08 LAB — CBC
HEMATOCRIT: 30.8 % — AB (ref 36.0–46.0)
Hemoglobin: 10.9 g/dL — ABNORMAL LOW (ref 12.0–15.0)
MCH: 34.8 pg — AB (ref 26.0–34.0)
MCHC: 35.4 g/dL (ref 30.0–36.0)
MCV: 98.4 fL (ref 78.0–100.0)
PLATELETS: 113 10*3/uL — AB (ref 150–400)
RBC: 3.13 MIL/uL — ABNORMAL LOW (ref 3.87–5.11)
RDW: 15 % (ref 11.5–15.5)
WBC: 6.4 10*3/uL (ref 4.0–10.5)

## 2016-07-08 LAB — BASIC METABOLIC PANEL
Anion gap: 14 (ref 5–15)
BUN: 21 mg/dL — AB (ref 6–20)
CHLORIDE: 102 mmol/L (ref 101–111)
CO2: 21 mmol/L — AB (ref 22–32)
CREATININE: 1.6 mg/dL — AB (ref 0.44–1.00)
Calcium: 9.1 mg/dL (ref 8.9–10.3)
GFR calc Af Amer: 39 mL/min — ABNORMAL LOW (ref >60)
GFR calc non Af Amer: 34 mL/min — ABNORMAL LOW (ref >60)
Glucose, Bld: 154 mg/dL — ABNORMAL HIGH (ref 65–99)
POTASSIUM: 3.8 mmol/L (ref 3.5–5.1)
Sodium: 137 mmol/L (ref 135–145)

## 2016-07-08 LAB — BRAIN NATRIURETIC PEPTIDE: B NATRIURETIC PEPTIDE 5: 42 pg/mL (ref 0.0–100.0)

## 2016-07-08 LAB — I-STAT TROPONIN, ED: Troponin i, poc: 0.01 ng/mL (ref 0.00–0.08)

## 2016-07-08 MED ORDER — IOPAMIDOL (ISOVUE-370) INJECTION 76%
INTRAVENOUS | Status: AC
  Administered 2016-07-08: 80 mL
  Filled 2016-07-08: qty 100

## 2016-07-08 MED ORDER — SODIUM CHLORIDE 0.9 % IV BOLUS (SEPSIS)
1000.0000 mL | Freq: Once | INTRAVENOUS | Status: AC
  Administered 2016-07-08: 1000 mL via INTRAVENOUS

## 2016-07-08 MED ORDER — SODIUM CHLORIDE 0.9 % IV BOLUS (SEPSIS)
1000.0000 mL | Freq: Once | INTRAVENOUS | Status: AC
  Administered 2016-07-09: 1000 mL via INTRAVENOUS

## 2016-07-08 NOTE — ED Notes (Signed)
Patient returned from CT

## 2016-07-08 NOTE — H&P (Signed)
History and Physical    Cassandra [name] CBU:[id number] DOB: 1955-10-03 DOA: 07/08/2016  Referring MD/NP/PA: Esaw Grandchild, MD(resident) PCP: No PCP Per Patient  Patient coming from: Home  Chief Complaint: Right leg swelling and shortness of breath  HPI: Cassandra [name] is a 61 y.o. 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 Over the last week patient notes that she's been more fatigued than usual reporting decreased overall po intake. This morning noted right leg swelling noting that it was significantly bigger than her left. Associated symptoms included intermittent chills, nonproductive cough, loose stools for the last 2 days, and decreased energy. At baseline patient notes that she's not very mobile. Denies any significant redness of skin, rash, fever, calf pain, chest pain, recent antibiotics, travel, or blood in stool/urine. The only other change is noted was Dr. Milinda Antis of rheumatology was decreasing the amount of prednisone monthly. This month she decreased from 40 mg prednisone daily to 30.   ED Course: Upon admission into the emergency department patient was seen to be afebrile with respirations 15-24, and blood pressures low normal for the patient at 91/66. Labs reveal hemoglobin 10.9, platelets 113, BUN 21, creatinine 1.6. patient's urinalysis did not appear significant for signs of infection.  Review of Systems: As per HPI otherwise 10 point review of systems negative.   Past Medical History:  Diagnosis Date  . Allergy   . Anxiety   . Complication of anesthesia    difficulty waking up  . Depression   . Dysrhythmia    svt  . GERD (gastroesophageal reflux disease)   . Heart murmur    never seen cardiologist for this  . Hypertension   . Leukocytoclastic vasculitis (HCC)    Dr.  Chase Caller and Dr. Estanislado Pandy  . Migraines    maybe one a year  . Renal disorder   . Renal insufficiency   . Sleep apnea    cpap  . SVT (supraventricular tachycardia) ([place])   . Wegner's disease (congenital syphilitic osteochondritis)     Past Surgical History:  Procedure Laterality Date  . BUNIONECTOMY Bilateral   . CARPAL TUNNEL RELEASE Right 2002  . JOINT REPLACEMENT Right    knee  . TONSILLECTOMY  08/2002  . TOTAL KNEE ARTHROPLASTY Right   . TOTAL KNEE ARTHROPLASTY Left 02/21/2014   Procedure: LEFT TOTAL KNEE ARTHROPLASTY;  Surgeon: Hessie Dibble, MD;  Location: [facility];  Service: Orthopedics;  Laterality: Left;  . TUBAL LIGATION  1980     reports that she has never smoked. She has never used smokeless tobacco. She reports that she does not drink alcohol or use drugs.  Allergies  Allergen Reactions  . Ampicillin     Rash   . Clarithromycin     unknown  . Codeine     Head feels like its crawling  . Effexor [Venlafaxine]     Hives   . Flagyl [Metronidazole]     unknown  . Fluoxetine Hcl Itching    Deep Itch   . Myrbetriq [Mirabegron]     Caused back pai, dry mouth  . Nsaids Other (See Comments)    Hypersensitivity vasculitis  . Paxil [Paroxetine]   . Reglan [Metoclopramide]     Blister in mouth   . Sulfa Antibiotics     Stomach irritation  . Tetracycline  Severe stomach pain    Family History  Problem Relation Age of Onset  . Clotting disorder Mother 20    Blood clot, foot amputation  . Cervical cancer Mother   . Arthritis Sister   . Heart attack Maternal Grandmother 51  . Breast cancer Sister   . Asthma Cousin     Prior to Admission medications   Medication Sig Start Date End Date Taking? Authorizing Provider  cyclophosphamide (CYTOXAN) 50 MG capsule Take 150 mg by mouth daily. 03/27/16  Yes Historical Provider, MD  DEXILANT 30 MG capsule Take 30 mg by mouth daily. 06/25/16  Yes Historical Provider, MD  furosemide (LASIX) 20 MG tablet Take 20 mg  by mouth daily. 06/25/16  Yes Historical Provider, MD  lisinopril (PRINIVIL,ZESTRIL) 5 MG tablet Take 5 mg by mouth daily.   Yes Historical Provider, MD  metoprolol (LOPRESSOR) 25 MG tablet Take 1 tablet twice a day Patient taking differently: Take 25 mg by mouth 2 (two) times daily.  04/15/16  Yes Isla Pence, MD  predniSONE (DELTASONE) 10 MG tablet Take 30 mg by mouth daily.  04/01/16  Yes Historical Provider, MD  sertraline (ZOLOFT) 100 MG tablet TAKE 1 TABLET BY MOUTH EVERY NIGHT AT BEDTIME 01/31/14  Yes Susy Frizzle, MD  VESICARE 5 MG tablet Take 5 mg by mouth daily. 04/14/16  Yes Historical Provider, MD  hydrocortisone (ANUSOL-HC) 25 MG suppository Place 1 suppository (25 mg total) rectally 2 (two) times daily. Patient not taking: Reported on 07/08/2016 03/19/16   Nicholes Mango, MD  hydrOXYzine (ATARAX/VISTARIL) 25 MG tablet Take 1 tablet (25 mg total) by mouth 3 (three) times daily as needed for itching. Patient not taking: Reported on 07/08/2016 03/19/16   Nicholes Mango, MD  metFORMIN (GLUCOPHAGE) 500 MG tablet Take 1 tablet (500 mg total) by mouth 2 (two) times daily with a meal. Patient not taking: Reported on 07/08/2016 03/19/16   Nicholes Mango, MD  prednisoLONE 5 MG TABS tablet Take 4 tablets (20 mg total) by mouth 3 (three) times daily. take 4 tablets by mouth 3 times a day Patient not taking: Reported on 07/08/2016 03/19/16   Nicholes Mango, MD    Physical Exam:  Constitutional: NAD, calm, comfortable Vitals:   07/08/16 1930 07/08/16 2000 07/08/16 2015 07/08/16 2045  BP: 91/66 106/67 (!) 98/58 (!) 96/57  Pulse: 66 64 67 71  Resp: _0 (!) 24  Temp:      TempSrc:      SpO2: 95% 97% 96% 96%  Weight:      Height:       Eyes: PERRL, lids and conjunctivae normal ENMT: Mucous membranes are dry. Posterior pharynx clear of any exudate or lesions.  Neck: normal, supple, no masses, no thyromegaly Respiratory: clear to auscultation bilaterally, no wheezing, no crackles. Normal respiratory  effort. No accessory muscle use.  Cardiovascular: Regular rate and rhythm, no murmurs / rubs / gallops. 2+ pitting edema of the right lower extremity and no lower extremity edema noted on the left. 2+ pedal pulses. No carotid bruits.  Abdomen: no tenderness, no masses palpated. No hepatosplenomegaly. Bowel sounds positive.  Musculoskeletal: no clubbing / cyanosis. No joint deformity upper and lower extremities. Good ROM, no contractures. Normal muscle tone.  Skin: no rashes, lesions, ulcers. No induration Neurologic: CN 2-12 grossly intact. Sensation intact, DTR normal. Strength 5/5 in all 4.  Psychiatric: Normal judgment and insight. Alert and oriented x 3. Normal mood.     Labs on Admission: I have personally  reviewed following labs and imaging studies  CBC:  Recent Labs Lab 07/08/16 1920  WBC 6.4  HGB 10.9*  HCT 30.8*  MCV 98.4  PLT 657*   Basic Metabolic Panel:  Recent Labs Lab 07/08/16 1920  NA 137  K 3.8  CL 102  CO2 21*  GLUCOSE 154*  BUN 21*  CREATININE 1.60*  CALCIUM 9.1   GFR: Estimated Creatinine Clearance: 42.6 mL/min (A) (by C-G formula based on SCr of 1.6 mg/dL (H)). Liver Function Tests: No results for input(s): AST, ALT, ALKPHOS, BILITOT, PROT, ALBUMIN in the last 168 hours. No results for input(s): LIPASE, AMYLASE in the last 168 hours. No results for input(s): AMMONIA in the last 168 hours. Coagulation Profile: No results for input(s): INR, PROTIME in the last 168 hours. Cardiac Enzymes: No results for input(s): CKTOTAL, CKMB, CKMBINDEX, TROPONINI in the last 168 hours. BNP (last 3 results) No results for input(s): PROBNP in the last 8760 hours. HbA1C: No results for input(s): HGBA1C in the last 72 hours. CBG: No results for input(s): GLUCAP in the last 168 hours. Lipid Profile: No results for input(s): CHOL, HDL, LDLCALC, TRIG, CHOLHDL, LDLDIRECT in the last 72 hours. Thyroid Function Tests: No results for input(s): TSH, T4TOTAL, FREET4,  T3FREE, THYROIDAB in the last 72 hours. Anemia Panel: No results for input(s): VITAMINB12, FOLATE, FERRITIN, TIBC, IRON, RETICCTPCT in the last 72 hours. Urine analysis:    Component Value Date/Time   COLORURINE YELLOW 07/08/2016 2146   APPEARANCEUR HAZY (A) 07/08/2016 2146   LABSPEC 1.033 (H) 07/08/2016 2146   PHURINE 5.0 07/08/2016 2146   GLUCOSEU NEGATIVE 07/08/2016 2146   GLUCOSEU NEGATIVE 11/30/2008 1522   HGBUR MODERATE (A) 07/08/2016 2146   BILIRUBINUR NEGATIVE 07/08/2016 2146   [place] 07/08/2016 2146   PROTEINUR 30 (A) 07/08/2016 2146   UROBILINOGEN 1 06/02/2013 1524   NITRITE NEGATIVE 07/08/2016 2146   LEUKOCYTESUR NEGATIVE 07/08/2016 2146   Sepsis Labs: No results found for this or any previous visit (from the past 240 hour(s)).   Radiological Exams on Admission: Ct Angio Chest Pe W And/or Wo Contrast  Result Date: 07/08/2016 CLINICAL DATA:  Shortness of breath for 6 months. RIGHT lower extremity swelling beginning today. History of stage 3 kidney disease, hypertension, pulmonary vasculitis. EXAM: CT ANGIOGRAPHY CHEST WITH CONTRAST TECHNIQUE: Multidetector CT imaging of the chest was performed using the standard protocol during bolus administration of intravenous contrast. Multiplanar CT image reconstructions and MIPs were obtained to evaluate the vascular anatomy. CONTRAST:  75 cc Isovue 370 COMPARISON:  Chest CT February 28, 2016 FINDINGS: Mild respiratory motion degraded examination CARDIOVASCULAR: Adequate contrast opacification of the pulmonary artery's. Main pulmonary artery is not enlarged. No pulmonary arterial filling defects to the level of the segmental branches. Respiratory motion limits assessment for subsegmental emboli. Heart size is mildly enlarged. Mild coronary artery calcifications. No pericardial effusion. Thoracic aorta is normal course and caliber, LEFT vertebral artery arises directly from the aortic arch, normal variant. MEDIASTINUM/NODES: No  lymphadenopathy by CT size criteria. LUNGS/PLEURA: Tracheobronchial tree is patent, no pneumothorax. Scattered, increasing ground-glass opacities with heterogeneous lung attenuation. No pleural effusion or focal consolidation. UPPER ABDOMEN: Included view of the abdomen is nonacute. Small to moderate hiatal hernia. MUSCULOSKELETAL: Visualized soft tissues and included osseous structures are nonacute. Mild degenerative change of the spine. Review of the MIP images confirms the above findings. IMPRESSION: No acute pulmonary embolism on this respiratory motion degraded examination. Heterogeneous lung attenuation and ground-glass opacities compatible with small airway disease, in part  attributable to patient's known pulmonary vasculopathy. Electronically Signed   By: Elon Alas M.D.   On: 07/08/2016 22:15    EKG: Independently reviewed. Sinus rhythm with low voltage noted in the precordial leads and QTC noted to be 605 which is new since last tracing performed 8 days ago.   Assessment/Pla si Acute kidney injury on chronic kidney disease stage III: Patient's baseline Cr 0.93-1.16, but presents with  creatinine 1.6 and BUN 21. Urinalysis shows high specific gravity to suggest dehydration.  - IVF NS at 100 ml/hr - Monitor I&O  - recheck BMP in a.m.  Dehydration - continue IV fluids  Right lower extremity swelling  - Check vascular Doppler ultrasound of lower extremity in a.m.   Essential hypertension - Hold lisinopril and furosemide 2/2 AKI  Prolonged QTC: Patient's initial QTC is noted to be 605 - Avoid further QT prolonging medications. - Recheck EKG in a.m.  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 Baseline hemoglobin previously within normal limits. - Continue to monitor  Thrombocytopenia: Acute. Platelet count dropped to 113. No previous history of low platelets counts previously noted in the past. - Continue  to monitor recheck platelet count in a.m. - Added on fibrinogen, d-dimer, and peripheral smear just to make sure that there are no signs of DIC  [name] - held pharmacy substitution of Protonix 2/2 prolonged QTC  DVT prophylaxis: Lovenox Code Status: Full  Family Communication: No family present at bedside  Disposition Plan: Likely discharge home once medically stable Consults called: none Admission status: Observation  Norval Morton MD [facility] Pager [phone]  If 7PM-7AM, please contact night-coverage [email or web address] Password [email or web address]  07/08/2016, 11:18 PM

## 2016-07-08 NOTE — ED Provider Notes (Signed)
[facility]-EMERGENCY DEPT Provider Note   CSN: [id number] Arrival date & time: 07/08/16  1842     History   Chief Complaint Chief Complaint  Patient presents with  . Weakness  . Shortness of Breath    HPI Cassandra [name] is a 61 y.o. female.   Shortness of Breath  This is a new problem. The problem occurs continuously.The current episode started 6 to 12 hours ago. The problem has been gradually worsening. Associated symptoms include cough and leg swelling. Pertinent negatives include no fever, no headaches, no coryza, no rhinorrhea, no sore throat, no swollen glands, no ear pain, no neck pain, no sputum production, no hemoptysis, no wheezing, no chest pain, no syncope, no vomiting, no abdominal pain, no rash and no leg pain. It is unknown what precipitated the problem. She has tried nothing for the symptoms. The treatment provided no relief. She has had prior hospitalizations. Associated medical issues include chronic lung disease. Associated medical issues do not include DVT or recent surgery.    Past Medical History:  Diagnosis Date  . Allergy   . Anxiety   . Complication of anesthesia    difficulty waking up  . Depression   . Dysrhythmia    svt  . GERD (gastroesophageal reflux disease)   . Heart murmur    never seen cardiologist for this  . Hypertension   . Leukocytoclastic vasculitis (HCC)    Dr. Marchelle Gearing and Dr. Corliss Skains  . Migraines    maybe one a year  . Renal disorder   . Renal insufficiency   . Sleep apnea    cpap  . SVT (supraventricular tachycardia) (HCC)   . Wegner's disease (congenital syphilitic osteochondritis)     Patient Active Problem List   Diagnosis Date Noted  . Hemoptysis 03/16/2016  . Positive ANA (antinuclear antibody) 03/15/2016  . 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 History of gross hematuria 02/20/2016  . Primary osteoarthritis of left knee 02/21/2014  . Obesity 02/21/2014  . Primary osteoarthritis of knee 02/21/2014  . SVT  (supraventricular tachycardia) (HCC) 02/17/2014  . Leukocytoclastic vasculitis (HCC)   . 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 Anxiety   . Depression   . GERD (gastroesophageal reflux disease)   . Dyspnea and respiratory abnormality 01/03/2009  . HEPATITIS C 12/07/2008  . VASCULITIS 12/07/2008  . MICROSCOPIC HEMATURIA 12/07/2008  . HEADACHE, CHRONIC 11/30/2008  . COUGH 11/30/2008    Past Surgical History:  Procedure Laterality Date  . BUNIONECTOMY Bilateral   . CARPAL TUNNEL RELEASE Right 2002  . JOINT REPLACEMENT Right    knee  . TONSILLECTOMY  08/2002  . TOTAL KNEE ARTHROPLASTY Right   . TOTAL KNEE ARTHROPLASTY Left 02/21/2014   Procedure: LEFT TOTAL KNEE ARTHROPLASTY;  Surgeon: Velna Ochs, MD;  Location: [facility] OR;  Service: Orthopedics;  Laterality: Left;  . TUBAL LIGATION  1980    OB History    No data available       Home Medications    Prior to Admission medications   Medication Sig Start Date End Date Taking? Authorizing Provider  cyclophosphamide (CYTOXAN) 50 MG capsule Take 150 mg by mouth daily. 03/27/16  Yes Historical Provider, MD  DEXILANT 30 MG capsule Take 30 mg by mouth daily. 06/25/16  Yes Historical Provider, MD  furosemide (LASIX) 20 MG tablet Take 20 mg by mouth daily. 06/25/16  Yes Historical Provider, MD  lisinopril (PRINIVIL,ZESTRIL) 5 MG tablet Take 5 mg by mouth daily.   Yes Historical Provider, MD  metoprolol (  LOPRESSOR) 25 MG tablet Take 1 tablet twice a day Patient taking differently: Take 25 mg by mouth 2 (two) times daily.  04/15/16  Yes Jacalyn Lefevre, MD  predniSONE (DELTASONE) 10 MG tablet Take 30 mg by mouth daily.  04/01/16  Yes Historical Provider, MD  sertraline (ZOLOFT) 100 MG tablet TAKE 1 TABLET BY MOUTH EVERY NIGHT AT BEDTIME 01/31/14  Yes Donita Brooks, MD  VESICARE 5 MG tablet Take 5 mg by mouth daily. 04/14/16  Yes Historical Provider, MD  hydrocortisone (ANUSOL-HC) 25 MG suppository Place 1 suppository (25 mg total)  rectally 2 (two) times daily. Patient not taking: Reported on 07/08/2016 03/19/16   Ramonita Lab, MD  hydrOXYzine (ATARAX/VISTARIL) 25 MG tablet Take 1 tablet (25 mg total) by mouth 3 (three) times daily as needed for itching. Patient not taking: Reported on 07/08/2016 03/19/16   Ramonita Lab, MD  metFORMIN (GLUCOPHAGE) 500 MG tablet Take 1 tablet (500 mg total) by mouth 2 (two) times daily with a meal. Patient not taking: Reported on 07/08/2016 03/19/16   Ramonita Lab, MD  prednisoLONE 5 MG TABS tablet Take 4 tablets (20 mg total) by mouth 3 (three) times daily. take 4 tablets by mouth 3 times a day Patient not taking: Reported on 07/08/2016 03/19/16   Ramonita Lab, MD    Family History Family History  Problem Relation Age of Onset  . Clotting disorder Mother 62    Blood clot, foot amputation  . Cervical cancer Mother   . Arthritis Sister   . Heart attack Maternal Grandmother 51  . Breast cancer Sister   . Asthma Cousin     Social History Social History  Substance Use Topics  . Smoking status: Never Smoker  . Smokeless tobacco: Never Used  . Alcohol use No     Allergies   Ampicillin; Clarithromycin; Codeine; Effexor [venlafaxine]; Flagyl [metronidazole]; Fluoxetine hcl; Myrbetriq [mirabegron]; Nsaids; Paxil [paroxetine]; Reglan [metoclopramide]; Sulfa antibiotics; and Tetracycline   Review of Systems Review of Systems  Constitutional: Positive for fatigue. Negative for fever.  HENT: Negative for ear pain, rhinorrhea and sore throat.   Eyes: Negative for pain.  Respiratory: Positive for cough and shortness of breath. Negative for hemoptysis, sputum production and wheezing.   Cardiovascular: Positive for leg swelling. Negative for chest pain and syncope.  Gastrointestinal: Positive for diarrhea. Negative for abdominal pain and vomiting.  Genitourinary: Negative for dysuria.  Musculoskeletal: Negative for neck pain.  Skin: Negative for rash.  Neurological: Negative for headaches.    Psychiatric/Behavioral: Negative for confusion.     Physical Exam Updated Vital Signs BP 104/72   Pulse 67   Temp 97.4 F (36.3 C) (Oral)   Resp 18   Ht  (1.575 m)   Wt 107.5 kg   SpO2 96%   BMI 43.35 kg/m   Physical Exam  Constitutional: She appears well-developed and well-nourished. She has a sickly appearance. No distress.  HENT:  Head: Normocephalic and atraumatic.  Mouth/Throat: Mucous membranes are dry.  Eyes: Conjunctivae are normal.  Neck: Neck supple.  Cardiovascular: Normal rate, regular rhythm and intact distal pulses.   No murmur heard. Pulmonary/Chest: Effort normal and breath sounds normal. No respiratory distress.  Abdominal: Soft. There is no tenderness.  Musculoskeletal: She exhibits no edema.  Neurological: She is alert. No cranial nerve deficit or sensory deficit. GCS eye subscore is 4. GCS verbal subscore is 5. GCS motor subscore is 6.  Skin: Skin is warm and dry. No rash noted.  Psychiatric: She has a normal mood and affect. Her speech is normal.  Nursing note and vitals reviewed.    ED Treatments / Results  Labs (all labs ordered are listed, but only abnormal results are displayed) Labs Reviewed  BASIC METABOLIC PANEL - Abnormal; Notable for the following:       Result Value   CO2 21 (*)    Glucose, Bld 154 (*)    BUN 21 (*)    Creatinine, Ser 1.60 (*)    GFR calc non Af Amer 34 (*)    GFR calc Af Amer 39 (*)    All other components within normal limits  CBC - Abnormal; Notable for the following:    RBC 3.13 (*)    Hemoglobin 10.9 (*)    HCT 30.8 (*)    MCH 34.8 (*)    Platelets 113 (*)    All other components within normal limits  URINALYSIS, ROUTINE W REFLEX MICROSCOPIC - Abnormal; Notable for the following:    APPearance HAZY (*)    Specific Gravity, Urine 1.033 (*)    Hgb urine dipstick MODERATE (*)    Protein, ur 30 (*)    Squamous Epithelial / LPF 0-5 (*)    Non Squamous Epithelial 0-5 (*)    All other components  within normal limits  BRAIN NATRIURETIC PEPTIDE  SEDIMENTATION RATE  C-REACTIVE PROTEIN  PROTIME-INR  APTT  I-STAT TROPOININ, ED  I-STAT TROPOININ, ED    EKG  EKG Interpretation  Date/Time:  Tuesday July 08 2016 18:48:32 EDT Ventricular Rate:  74 PR Interval:    QRS Duration: 65 QT Interval:  545 QTC Calculation: 605 R Axis:   10 Text Interpretation:  Sinus rhythm Low voltage, precordial leads Abnormal R-wave progression, early transition Nonspecific T abnrm, anterolateral leads Prolonged QT interval Since last tracing rate slower Confirmed by KNAPP  MD-J, JON ([id number]) on 07/08/2016 6:54:09 PM       Radiology Ct Angio Chest Pe W And/or Wo Contrast  Result Date: 07/08/2016 CLINICAL DATA:  Shortness of breath for 6 months. RIGHT lower extremity swelling beginning today. History of stage 3 kidney disease, hypertension, pulmonary vasculitis. EXAM: CT ANGIOGRAPHY CHEST WITH CONTRAST TECHNIQUE: Multidetector CT imaging of the chest was performed using the standard protocol during bolus administration of intravenous contrast. Multiplanar CT image reconstructions and MIPs were obtained to evaluate the vascular anatomy. CONTRAST:  75 cc Isovue 370 COMPARISON:  Chest CT February 28, 2016 FINDINGS: Mild respiratory motion degraded examination CARDIOVASCULAR: Adequate contrast opacification of the pulmonary artery's. Main pulmonary artery is not enlarged. No pulmonary arterial filling defects to the level of the segmental branches. Respiratory motion limits assessment for subsegmental emboli. Heart size is mildly enlarged. Mild coronary artery calcifications. No pericardial effusion. Thoracic aorta is normal course and caliber, LEFT vertebral artery arises directly from the aortic arch, normal variant. MEDIASTINUM/NODES: No lymphadenopathy by CT size criteria. LUNGS/PLEURA: Tracheobronchial tree is patent, no pneumothorax. Scattered, increasing ground-glass opacities with heterogeneous lung  attenuation. No pleural effusion or focal consolidation. UPPER ABDOMEN: Included view of the abdomen is nonacute. Small to moderate hiatal hernia. MUSCULOSKELETAL: Visualized soft tissues and included osseous structures are nonacute. Mild degenerative change of the spine. Review of the MIP images confirms the above findings. IMPRESSION: No acute pulmonary embolism on this respiratory motion degraded examination. Heterogeneous lung attenuation and ground-glass opacities compatible with small airway disease, in part attributable to patient's known pulmonary vasculopathy. Electronically Signed   By: Awilda Metro M.D.   On: 07/08/2016  22:15    Procedures Procedures (including critical care time)  Medications Ordered in ED Medications  sodium chloride 0.9 % bolus 1,000 mL (not administered)  sodium chloride 0.9 % bolus 1,000 mL (0 mLs Intravenous Stopped 07/08/16 2334)  iopamidol (ISOVUE-370) 76 % injection (80 mLs  Contrast Given 07/08/16 2125)     Initial Impression / Assessment and Plan / ED Course  I have reviewed the triage vital signs and the nursing notes.  Pertinent labs & imaging results that were available during my care of the patient were reviewed by me and considered in my medical decision making (see chart for details).     61 year old female with history of pink associated vasculitis presents in the setting of fatigue, leg swelling, shortness of breath. Patient reports today she awoke with significant fatigue and noted she had swelling to right lower extremity. Patient does have mild abrasion on leg from recent cat scratch but no obvious signs of cellulitis or infection. Patient additionally reports shortness of breath and decreased by mouth intake.  On arrival patient hemodynamically stable and afebrile. Patient with low normal blood pressures. Patient given IV fluids as she appears dehydrated on examination. I have concern for possible DVT of right lower additionally however  unable to obtain ultrasound at this time of night. CT/PE study obtained without signs of pulmonary embolus or other acute abnormality of lungs. Laboratory analysis revealed acute kidney injury and this is likely related to dehydration.  Patient will require admission to hospital for further management of AKI and need for DVT study. We'll defer anticoagulation recommendation to inpatient team. Patient stable at time of transfer of care.  Final Clinical Impressions(s) / ED Diagnoses   Final diagnoses:  Leg swelling  AKI (acute kidney injury) (HCC)  SOB (shortness of breath)  Other fatigue    New Prescriptions New Prescriptions   No medications on file     Stacy Gardner, MD 07/08/16 2352    Linwood Dibbles, MD 07/10/16 1016

## 2016-07-08 NOTE — ED Notes (Signed)
admitting Provider at bedside. 

## 2016-07-08 NOTE — ED Notes (Signed)
ED Provider at bedside. States ok to eat and drink

## 2016-07-08 NOTE — ED Notes (Signed)
Patient transported to CT 

## 2016-07-08 NOTE — ED Triage Notes (Signed)
Per [place] pt has been having on going generalized weakness x months with Right leg weakness that began today and shob x 2 months. Pt is in kidney failure and is on chemo medication to help kidneys. 98% on 2L, BP 98/60, RR 20, HR 70 NSR. Pt alert and oriented x 4.

## 2016-07-09 ENCOUNTER — Observation Stay (HOSPITAL_BASED_OUTPATIENT_CLINIC_OR_DEPARTMENT_OTHER): Payer: Medicare HMO

## 2016-07-09 DIAGNOSIS — N179 Acute kidney failure, unspecified: Secondary | ICD-10-CM | POA: Diagnosis present

## 2016-07-09 DIAGNOSIS — I82409 Acute embolism and thrombosis of unspecified deep veins of unspecified lower extremity: Secondary | ICD-10-CM

## 2016-07-09 DIAGNOSIS — E860 Dehydration: Secondary | ICD-10-CM | POA: Diagnosis not present

## 2016-07-09 DIAGNOSIS — M7989 Other specified soft tissue disorders: Secondary | ICD-10-CM | POA: Diagnosis not present

## 2016-07-09 DIAGNOSIS — R9431 Abnormal electrocardiogram [ECG] [EKG]: Secondary | ICD-10-CM | POA: Diagnosis not present

## 2016-07-09 DIAGNOSIS — I82421 Acute embolism and thrombosis of right iliac vein: Secondary | ICD-10-CM | POA: Diagnosis not present

## 2016-07-09 DIAGNOSIS — I82401 Acute embolism and thrombosis of unspecified deep veins of right lower extremity: Secondary | ICD-10-CM | POA: Diagnosis not present

## 2016-07-09 DIAGNOSIS — D649 Anemia, unspecified: Secondary | ICD-10-CM | POA: Diagnosis present

## 2016-07-09 LAB — PROTIME-INR
INR: 1.16
PROTHROMBIN TIME: 14.9 s (ref 11.4–15.2)

## 2016-07-09 LAB — BASIC METABOLIC PANEL
ANION GAP: 11 (ref 5–15)
BUN: 16 mg/dL (ref 6–20)
CO2: 21 mmol/L — ABNORMAL LOW (ref 22–32)
Calcium: 8.3 mg/dL — ABNORMAL LOW (ref 8.9–10.3)
Chloride: 104 mmol/L (ref 101–111)
Creatinine, Ser: 1.34 mg/dL — ABNORMAL HIGH (ref 0.44–1.00)
GFR, EST AFRICAN AMERICAN: 48 mL/min — AB (ref >60)
GFR, EST NON AFRICAN AMERICAN: 42 mL/min — AB (ref >60)
Glucose, Bld: 111 mg/dL — ABNORMAL HIGH (ref 65–99)
POTASSIUM: 3.2 mmol/L — AB (ref 3.5–5.1)
SODIUM: 136 mmol/L (ref 135–145)

## 2016-07-09 LAB — CBC
HEMATOCRIT: 26.8 % — AB (ref 36.0–46.0)
HEMOGLOBIN: 9.3 g/dL — AB (ref 12.0–15.0)
MCH: 34.8 pg — ABNORMAL HIGH (ref 26.0–34.0)
MCHC: 34.7 g/dL (ref 30.0–36.0)
MCV: 100.4 fL — ABNORMAL HIGH (ref 78.0–100.0)
Platelets: 102 10*3/uL — ABNORMAL LOW (ref 150–400)
RBC: 2.67 MIL/uL — ABNORMAL LOW (ref 3.87–5.11)
RDW: 15.1 % (ref 11.5–15.5)
WBC: 4.8 10*3/uL (ref 4.0–10.5)

## 2016-07-09 LAB — SAVE SMEAR

## 2016-07-09 LAB — SEDIMENTATION RATE: Sed Rate: 30 mm/hr — ABNORMAL HIGH (ref 0–22)

## 2016-07-09 LAB — MAGNESIUM: MAGNESIUM: 2.1 mg/dL (ref 1.7–2.4)

## 2016-07-09 LAB — D-DIMER, QUANTITATIVE (NOT AT ARMC)

## 2016-07-09 LAB — C-REACTIVE PROTEIN: CRP: 8.2 mg/dL — ABNORMAL HIGH (ref <1.0)

## 2016-07-09 LAB — APTT: aPTT: 20 seconds — ABNORMAL LOW (ref 24–36)

## 2016-07-09 LAB — FIBRINOGEN: Fibrinogen: 288 mg/dL (ref 210–475)

## 2016-07-09 MED ORDER — ACETAMINOPHEN 650 MG RE SUPP
650.0000 mg | Freq: Four times a day (QID) | RECTAL | Status: DC | PRN
Start: 2016-07-09 — End: 2016-07-23

## 2016-07-09 MED ORDER — PANTOPRAZOLE SODIUM 40 MG PO TBEC
40.0000 mg | DELAYED_RELEASE_TABLET | Freq: Every day | ORAL | Status: DC
  Administered 2016-07-10 – 2016-07-23 (×12): 40 mg via ORAL
  Filled 2016-07-09 (×13): qty 1

## 2016-07-09 MED ORDER — RIVAROXABAN 15 MG PO TABS
15.0000 mg | ORAL_TABLET | Freq: Two times a day (BID) | ORAL | Status: DC
  Administered 2016-07-09 – 2016-07-10 (×2): 15 mg via ORAL
  Filled 2016-07-09 (×2): qty 1

## 2016-07-09 MED ORDER — SODIUM CHLORIDE 0.9 % IV SOLN
INTRAVENOUS | Status: DC
  Administered 2016-07-09 (×2): via INTRAVENOUS

## 2016-07-09 MED ORDER — RIVAROXABAN (XARELTO) EDUCATION KIT FOR DVT/PE PATIENTS
PACK | Freq: Once | Status: AC
  Administered 2016-07-09: 21:00:00
  Filled 2016-07-09: qty 1

## 2016-07-09 MED ORDER — ENOXAPARIN SODIUM 120 MG/0.8ML ~~LOC~~ SOLN
1.0000 mg/kg | Freq: Two times a day (BID) | SUBCUTANEOUS | Status: DC
  Administered 2016-07-09 (×2): 110 mg via SUBCUTANEOUS
  Filled 2016-07-09 (×2): qty 0.8

## 2016-07-09 MED ORDER — ALBUTEROL SULFATE (2.5 MG/3ML) 0.083% IN NEBU: 2.5000 mg | INHALATION_SOLUTION | RESPIRATORY_TRACT | Status: DC | PRN

## 2016-07-09 MED ORDER — ACETAMINOPHEN 325 MG PO TABS
650.0000 mg | ORAL_TABLET | Freq: Four times a day (QID) | ORAL | Status: DC | PRN
  Administered 2016-07-13 – 2016-07-16 (×4): 650 mg via ORAL
  Filled 2016-07-09 (×4): qty 2

## 2016-07-09 MED ORDER — PROCHLORPERAZINE EDISYLATE 5 MG/ML IJ SOLN
10.0000 mg | Freq: Four times a day (QID) | INTRAMUSCULAR | Status: DC | PRN
  Filled 2016-07-09: qty 2

## 2016-07-09 MED ORDER — PREDNISONE 20 MG PO TABS
30.0000 mg | ORAL_TABLET | Freq: Every day | ORAL | Status: DC
  Administered 2016-07-09 – 2016-07-13 (×5): 30 mg via ORAL
  Administered 2016-07-15: 20 mg via ORAL
  Administered 2016-07-16 – 2016-07-23 (×7): 30 mg via ORAL
  Filled 2016-07-09 (×14): qty 1

## 2016-07-09 MED ORDER — POTASSIUM CHLORIDE CRYS ER 20 MEQ PO TBCR
40.0000 meq | EXTENDED_RELEASE_TABLET | ORAL | Status: AC
  Administered 2016-07-09: 40 meq via ORAL
  Filled 2016-07-09: qty 2

## 2016-07-09 MED ORDER — CYCLOPHOSPHAMIDE 50 MG PO CAPS
150.0000 mg | ORAL_CAPSULE | Freq: Every day | ORAL | Status: DC
  Administered 2016-07-09 – 2016-07-15 (×6): 150 mg via ORAL
  Filled 2016-07-09 (×7): qty 3

## 2016-07-09 NOTE — Care Management Note (Signed)
Case Management Note Donn Pierini RN, BSN Unit 2W-Case Manager [phone]  Patient Details  Name: Cassandra [name] MRN: [id number] Date of Birth: June 18, 1955  Subjective/Objective:  Pt presented with DVT and AKI                  Action/Plan: PTA pt lived at home, independent- per MD pt to start on Xarelto- insurance check completed - copay $8.25- spoke with pt at bedside coverage info shared- 30 day free card given to pt for use on discharge- along with info on where to fill starter pack if prescribed at discharge- ([facility] or [facility])  Expected Discharge Date:                  Expected Discharge Plan:  Home/Self Care  In-House Referral:     Discharge planning Services  CM Consult, Medication Assistance  Post Acute Care Choice:    Choice offered to:     DME Arranged:    DME Agency:     HH Arranged:    HH Agency:     Status of Service:  Completed, signed off  If discussed at [employer], dates discussed:    Additional Comments:  Darrold Span, RN 07/09/2016, 10:22 PM

## 2016-07-09 NOTE — Progress Notes (Signed)
Per insurance check on Xarelto vs Eliquis # 3. S/W GLADYS  @ [employer] # [phone] OPT- 2   1. XARELTO 20 MG DAILY  COVER- YES  CO-PAY- $ 8.35  TIER- 3 DRUG  PRIOR APPROVAL- NO   2. ELIQUIS 5 MG BID   COVER- YES  CO-PAY- $ 8.35  TIER- 3 DRUG  PRIOR APPROVAL- NO   PHARMACY : [facility] AND [facility]

## 2016-07-09 NOTE — Discharge Instructions (Addendum)
Information on my medicine - XARELTO (rivaroxaban)  This medication education was reviewed with me or my healthcare representative as part of my discharge preparation.  WHY WAS XARELTO PRESCRIBED FOR YOU? Xarelto was prescribed to treat blood clots that may have been found in the veins of your legs (deep vein thrombosis) or in your lungs (pulmonary embolism) and to reduce the risk of them occurring again.  What do you need to know about Xarelto? The starting dose is one 15 mg tablet taken TWICE daily with food for the FIRST 21 DAYS then on (enter date)  08/08/2016  the dose is changed to one 20 mg tablet taken ONCE A DAY with your evening meal.  DO NOT stop taking Xarelto without talking to the health care provider who prescribed the medication.  Refill your prescription for 20 mg tablets before you run out.  After discharge, you should have regular check-up appointments with your healthcare provider that is prescribing your Xarelto.  In the future your dose may need to be changed if your kidney function changes by a significant amount.  What do you do if you miss a dose? If you are taking Xarelto TWICE DAILY and you miss a dose, take it as soon as you remember. You may take two 15 mg tablets (total 30 mg) at the same time then resume your regularly scheduled 15 mg twice daily the next day.  If you are taking Xarelto ONCE DAILY and you miss a dose, take it as soon as you remember on the same day then continue your regularly scheduled once daily regimen the next day. Do not take two doses of Xarelto at the same time.   Important Safety Information Xarelto is a blood thinner medicine that can cause bleeding. You should call your healthcare provider right away if you experience any of the following: ? Bleeding from an injury or your nose that does not stop. ? Unusual colored urine (red or dark brown) or unusual colored stools (red or black). ? Unusual bruising for unknown reasons. ? A  serious fall or if you hit your head (even if there is no bleeding).  Some medicines may interact with Xarelto and might increase your risk of bleeding while on Xarelto. To help avoid this, consult your healthcare provider or pharmacist prior to using any new prescription or non-prescription medications, including herbals, vitamins, non-steroidal anti-inflammatory drugs (NSAIDs) and supplements.  This website has more information on Xarelto: VisitDestination.com.br.

## 2016-07-09 NOTE — Progress Notes (Signed)
Patient received from ED and oriented to room. Tele monitoring and skin assessment complete. Stated that she was tired and wanted to do the admission tomorrow. Call light within reach

## 2016-07-09 NOTE — Care Management Obs Status (Signed)
MEDICARE OBSERVATION STATUS NOTIFICATION   Patient Details  Name: Cassandra [name] MRN: [id number] Date of Birth: [birth year]   Medicare Observation Status Notification Given:  Yes    Darrold Span, RN 07/09/2016, 5:12 PM

## 2016-07-09 NOTE — Progress Notes (Signed)
PROGRESS NOTE    Cassandra [name]  QQP:[id number] DOB: Nov 20, 1955 DOA: 07/08/2016 PCP: No PCP Per Patient   Outpatient Specialists:    Brief Narrative:  Cassandra [name] is a 61 y.o. 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 Over the last week patient notes that she's been more fatigued than usual reporting decreased overall po intake. This morning noted right leg swelling noting that it was significantly bigger than her left. Associated symptoms included intermittent chills, nonproductive cough, loose stools for the last 2 days, and decreased energy. At baseline patient notes that she's not very mobile. Denies any significant redness of skin, rash, fever, calf pain, chest pain, recent antibiotics, travel, or blood in stool/urine. The only other change is noted was Dr. Milinda Antis of rheumatology was decreasing the amount of prednisone monthly. This month she decreased from 40 mg prednisone daily to 30.   Assessment & Plan:   Principal Problem:   AKI (acute kidney injury) ([place]) Active Problems:   GERD (gastroesophageal reflux disease)   Positive ANA (antinuclear antibody)   Right leg swelling   Dehydration   Anemia   Prolonged QT interval  DVT of RLE -extensive swelling on right leg -vascular checks -elevate leg -lovenox full dose, plan to transition to xarelto./elquis depending on insurance coverage  Acute kidney injury on chronic kidney disease stage III:  -Urinalysis shows high specific gravity to suggest dehydration.  - IVF NS at 100 ml/hr -BMP in AM  Dehydration - continue IV fluids -Cr improving  Hypokalemia -replete PO -check Mg  Essential hypertension - Hold lisinopril and furosemide 2/2 AKI  Prolonged QTC: Patient's initial QTC is noted to be 605 - Avoid further QT  prolonging medications. - improved on AM EKG to < 450  P-ANCA/Wagner granulomatosis - Check ESR- lower than baseline - Continue prednisone  Anemia: Hemoglobin 10.9 on admission - Continue to monitor  Thrombocytopenia: Acute. Platelet count dropped to 113. No previous history of low platelets counts previously noted in the past. - monitor -? Medication side effect?  [name] - protonix    DVT prophylaxis:  Fully anticoagulated   Code Status: Full Code   Family Communication:   Disposition Plan:    Consultants:        Subjective: c/o nausea  Objective: Vitals:   07/09/16 0015 07/09/16 0030 07/09/16 0140 07/09/16 0500  BP: (!) 105/58 107/61 131/62 (!) 127/57  Pulse: 66 68 (!) 58 79  Resp: 20 19 20 20   Temp:   97.1 F (36.2 C) 97.5 F (36.4 C)  TempSrc:   Oral Oral  SpO2: 95% 96% 98% 94%  Weight:   106.3 kg (234 lb 6.4 oz)   Height:   5' 2"  (1.575 m)     Intake/Output Summary (Last 24 hours) at 07/09/16 1127 Last data filed at 07/09/16 0500  Gross per 24 hour  Intake              360 ml  Output              350 ml  Net               10 ml   Filed Weights   07/08/16 1847 07/09/16 0140  Weight: 107.5 kg (237 lb) 106.3 kg (234 lb 6.4 oz)    Examination:  General exam: obese,  uncomfortable appearing Respiratory system: Clear to auscultation. Respiratory effort normal. Cardiovascular system: S1 & S2 heard, RRR. No JVD, murmurs, rubs, gallops or clicks.. Gastrointestinal system: Abdomen is nondistended, soft and nontender. No organomegaly or masses felt. Normal bowel sounds heard. Central nervous system: Alert and oriented. No focal neurological deficits. Right leg swollen, cooler than left with decreased pulses    Data Reviewed: I have personally reviewed following labs and imaging studies  CBC:  Recent Labs Lab 07/08/16 1920 07/09/16 0824  WBC 6.4 4.8  HGB 10.9* 9.3*  HCT 30.8* 26.8*  MCV 98.4 100.4*  PLT 113* 102*   Basic  Metabolic Panel:  Recent Labs Lab 07/08/16 1920 07/09/16 0244  NA 137 136  K 3.8 3.2*  CL 102 104  CO2 21* 21*  GLUCOSE 154* 111*  BUN 21* 16  CREATININE 1.60* 1.34*  CALCIUM 9.1 8.3*   GFR: Estimated Creatinine Clearance: 50.5 mL/min (A) (by C-G formula based on SCr of 1.34 mg/dL (H)). Liver Function Tests: No results for input(s): AST, ALT, ALKPHOS, BILITOT, PROT, ALBUMIN in the last 168 hours. No results for input(s): LIPASE, AMYLASE in the last 168 hours. No results for input(s): AMMONIA in the last 168 hours. Coagulation Profile:  Recent Labs Lab 07/09/16 0008  INR 1.16   Cardiac Enzymes: No results for input(s): CKTOTAL, CKMB, CKMBINDEX, TROPONINI in the last 168 hours. BNP (last 3 results) No results for input(s): PROBNP in the last 8760 hours. HbA1C: No results for input(s): HGBA1C in the last 72 hours. CBG: No results for input(s): GLUCAP in the last 168 hours. Lipid Profile: No results for input(s): CHOL, HDL, LDLCALC, TRIG, CHOLHDL, LDLDIRECT in the last 72 hours. Thyroid Function Tests: No results for input(s): TSH, T4TOTAL, FREET4, T3FREE, THYROIDAB in the last 72 hours. Anemia Panel: No results for input(s): VITAMINB12, FOLATE, FERRITIN, TIBC, IRON, RETICCTPCT in the last 72 hours. Urine analysis:    Component Value Date/Time   COLORURINE YELLOW 07/08/2016 2146   APPEARANCEUR HAZY (A) 07/08/2016 2146   LABSPEC 1.033 (H) 07/08/2016 2146   PHURINE 5.0 07/08/2016 2146   GLUCOSEU NEGATIVE 07/08/2016 2146   GLUCOSEU NEGATIVE 11/30/2008 1522   HGBUR MODERATE (A) 07/08/2016 2146   BILIRUBINUR NEGATIVE 07/08/2016 2146   [place] 07/08/2016 2146   PROTEINUR 30 (A) 07/08/2016 2146   UROBILINOGEN 1 06/02/2013 1524   NITRITE NEGATIVE 07/08/2016 2146   LEUKOCYTESUR NEGATIVE 07/08/2016 2146     )No results found for this or any previous visit (from the past 240 hour(s)).    Anti-infectives    None       Radiology Studies: Ct Angio Chest  Pe W And/or Wo Contrast  Result Date: 07/08/2016 CLINICAL DATA:  Shortness of breath for 6 months. RIGHT lower extremity swelling beginning today. History of stage 3 kidney disease, hypertension, pulmonary vasculitis. EXAM: CT ANGIOGRAPHY CHEST WITH CONTRAST TECHNIQUE: Multidetector CT imaging of the chest was performed using the standard protocol during bolus administration of intravenous contrast. Multiplanar CT image reconstructions and MIPs were obtained to evaluate the vascular anatomy. CONTRAST:  75 cc Isovue 370 COMPARISON:  Chest CT February 28, 2016 FINDINGS: Mild respiratory motion degraded examination CARDIOVASCULAR: Adequate contrast opacification of the pulmonary artery's. Main pulmonary artery is not enlarged. No pulmonary arterial filling defects to the level of the segmental branches. Respiratory motion limits assessment for subsegmental emboli. Heart size is mildly enlarged. Mild coronary artery calcifications. No pericardial effusion. Thoracic aorta is normal course and caliber, LEFT vertebral artery arises directly from the  aortic arch, normal variant. MEDIASTINUM/NODES: No lymphadenopathy by CT size criteria. LUNGS/PLEURA: Tracheobronchial tree is patent, no pneumothorax. Scattered, increasing ground-glass opacities with heterogeneous lung attenuation. No pleural effusion or focal consolidation. UPPER ABDOMEN: Included view of the abdomen is nonacute. Small to moderate hiatal hernia. MUSCULOSKELETAL: Visualized soft tissues and included osseous structures are nonacute. Mild degenerative change of the spine. Review of the MIP images confirms the above findings. IMPRESSION: No acute pulmonary embolism on this respiratory motion degraded examination. Heterogeneous lung attenuation and ground-glass opacities compatible with small airway disease, in part attributable to patient's known pulmonary vasculopathy. Electronically Signed   By: Elon Alas M.D.   On: 07/08/2016 22:15         Scheduled Meds: . cyclophosphamide  150 mg Oral Daily  . enoxaparin (LOVENOX) injection  1 mg/kg Subcutaneous Q12H  . predniSONE  30 mg Oral Daily   Continuous Infusions: . sodium chloride 100 mL/hr at 07/09/16 0154     LOS: 0 days    Time spent: 25 min    [place], DO [facility] Pager [phone]  If 7PM-7AM, please contact night-coverage [email or web address] Password [facility] 07/09/2016, 11:27 AM

## 2016-07-09 NOTE — Progress Notes (Signed)
ANTICOAGULATION CONSULT NOTE - Initial Consult  Pharmacy Consult for Lovenox  Indication: Rule out DVT  Patient Measurements: Height:  (157.5 cm) Weight: 237 lb (107.5 kg) IBW/kg (Calculated) : 50.1  Vital Signs: Temp: 97.4 F (36.3 C) (04/24 1850) Temp Source: Oral (04/24 1850) BP: 107/61 (04/25 0030) Pulse Rate: 68 (04/25 0030)  Labs:  Recent Labs  07/08/16 1920 07/09/16 0008  HGB 10.9*  --   HCT 30.8*  --   PLT 113*  --   LABPROT  --  14.9  INR  --  1.16  CREATININE 1.60*  --     Estimated Creatinine Clearance: 42.6 mL/min (A) (by C-G formula based on SCr of 1.6 mg/dL (H)).   Medical History: Past Medical History:  Diagnosis Date  . Allergy   . Anxiety   . Complication of anesthesia    difficulty waking up  . Depression   . Dysrhythmia    svt  . GERD (gastroesophageal reflux disease)   . Heart murmur    never seen cardiologist for this  . Hypertension   . Leukocytoclastic vasculitis (HCC)    Dr. Marchelle Gearing and Dr. Corliss Skains  . Migraines    maybe one a year  . Renal disorder   . Renal insufficiency   . Sleep apnea    cpap  . SVT (supraventricular tachycardia) (HCC)   . Wegner's disease (congenital syphilitic osteochondritis)     Assessment: 61 y/o F with shortness of breath/leg swelling. CT is negative for PE. Will need DVT study in AM. Starting Lovenox. Hgb 10.9, Plts on the low side at 113 (watch), noted renal dysfunction.   Goal of Therapy:  Monitor platelets by anticoagulation protocol: Yes   Plan:  -Lovenox 1 mg/kg subcutaneous q12h -Daily CBC -Monitor for bleeding  -F/U DVT study  Abran Duke 07/09/2016,12:51 AM

## 2016-07-09 NOTE — Progress Notes (Signed)
ANTICOAGULATION CONSULT NOTE - Follow Up Consult  Pharmacy Consult for Lovenox -> Xarelto Indication: DVT  Allergies  Allergen Reactions  . Ampicillin     Rash   . Clarithromycin     unknown  . Codeine     Head feels like its crawling  . Effexor [Venlafaxine]     Hives   . Flagyl [Metronidazole]     unknown  . Fluoxetine Hcl Itching    Deep Itch   . Myrbetriq [Mirabegron]     Caused back pai, dry mouth  . Nsaids Other (See Comments)    Hypersensitivity vasculitis  . Paxil [Paroxetine]   . Reglan [Metoclopramide]     Blister in mouth   . Sulfa Antibiotics     Stomach irritation  . Tetracycline     Severe stomach pain    Patient Measurements: Height:  (157.5 cm) Weight: 234 lb 6.4 oz (106.3 kg) IBW/kg (Calculated) : 50.1  Vital Signs: Temp: 97.7 F (36.5 C) (04/25 1243) Temp Source: Axillary (04/25 1243) BP: 113/55 (04/25 1243) Pulse Rate: 84 (04/25 1243)  Labs:  Recent Labs  07/08/16 1920 07/09/16 0008 07/09/16 0244 07/09/16 0824  HGB 10.9*  --   --  9.3*  HCT 30.8*  --   --  26.8*  PLT 113*  --   --  102*  APTT  --  20*  --   --   LABPROT  --  14.9  --   --   INR  --  1.16  --   --   CREATININE 1.60*  --  1.34*  --     Estimated Creatinine Clearance: 50.5 mL/min (A) (by C-G formula based on SCr of 1.34 mg/dL (H)).  Assessment:   61 yr old female with new right leg DVT.  To transition from Lovenox to Xarelto.  Last Lovenox 110 mg sq dose given ~10am this morning.  Thrombocytopenia. Could be related to Cytoxan, which began ~ 1/11 2018. Platelet count 243 03/18/2016, 175K 04/15/16. No bleeding noted.  Goal of Therapy:  appropriate Xarelto dose for indication Monitor platelets by anticoagulation protocol: Yes   Plan:    Xarelto 15 mg BID x 21 days, then 20 mg daily with supper.   Xarelto to begin at 8pm tonight, about 2 hours before next Lovenox dose was due.   CBC daily for now. Watch platelet count, monitor for any bleeding.  Dennie Fetters, RPh Pager: [phone] 07/09/2016,1:54 PM

## 2016-07-09 NOTE — Progress Notes (Signed)
Preliminary results by tech - Venous Duplex Lower Ext. Completed. Right leg, positive for acute deep vein thrombosis involving the right  iliac veins, common femoral vein, femoral vein, popliteal vein and posterior tibial veins. The peroneal veins and IVC were not visualized due to body habitus. Left leg, negative for deep and superficial vein thrombosis in the LLE and iliac veins.  Results given to patient's nurse, Rosey Bath.  Marilynne Halsted, BS, RDMS, RVT

## 2016-07-10 DIAGNOSIS — T451X5A Adverse effect of antineoplastic and immunosuppressive drugs, initial encounter: Secondary | ICD-10-CM | POA: Diagnosis not present

## 2016-07-10 DIAGNOSIS — Z79899 Other long term (current) drug therapy: Secondary | ICD-10-CM | POA: Diagnosis not present

## 2016-07-10 DIAGNOSIS — F419 Anxiety disorder, unspecified: Secondary | ICD-10-CM | POA: Diagnosis present

## 2016-07-10 DIAGNOSIS — I340 Nonrheumatic mitral (valve) insufficiency: Secondary | ICD-10-CM | POA: Diagnosis not present

## 2016-07-10 DIAGNOSIS — I82421 Acute embolism and thrombosis of right iliac vein: Secondary | ICD-10-CM | POA: Diagnosis present

## 2016-07-10 DIAGNOSIS — I129 Hypertensive chronic kidney disease with stage 1 through stage 4 chronic kidney disease, or unspecified chronic kidney disease: Secondary | ICD-10-CM | POA: Diagnosis present

## 2016-07-10 DIAGNOSIS — D61811 Other drug-induced pancytopenia: Secondary | ICD-10-CM | POA: Diagnosis not present

## 2016-07-10 DIAGNOSIS — E876 Hypokalemia: Secondary | ICD-10-CM | POA: Diagnosis not present

## 2016-07-10 DIAGNOSIS — I82401 Acute embolism and thrombosis of unspecified deep veins of right lower extremity: Secondary | ICD-10-CM | POA: Diagnosis not present

## 2016-07-10 DIAGNOSIS — M7989 Other specified soft tissue disorders: Secondary | ICD-10-CM | POA: Diagnosis not present

## 2016-07-10 DIAGNOSIS — I82411 Acute embolism and thrombosis of right femoral vein: Secondary | ICD-10-CM | POA: Diagnosis present

## 2016-07-10 DIAGNOSIS — N183 Chronic kidney disease, stage 3 (moderate): Secondary | ICD-10-CM | POA: Diagnosis present

## 2016-07-10 DIAGNOSIS — Z7952 Long term (current) use of systemic steroids: Secondary | ICD-10-CM | POA: Diagnosis not present

## 2016-07-10 DIAGNOSIS — Z96653 Presence of artificial knee joint, bilateral: Secondary | ICD-10-CM | POA: Diagnosis present

## 2016-07-10 DIAGNOSIS — I82491 Acute embolism and thrombosis of other specified deep vein of right lower extremity: Secondary | ICD-10-CM | POA: Diagnosis not present

## 2016-07-10 DIAGNOSIS — R0602 Shortness of breath: Secondary | ICD-10-CM | POA: Diagnosis not present

## 2016-07-10 DIAGNOSIS — Z6841 Body Mass Index (BMI) 40.0 and over, adult: Secondary | ICD-10-CM | POA: Diagnosis not present

## 2016-07-10 DIAGNOSIS — K219 Gastro-esophageal reflux disease without esophagitis: Secondary | ICD-10-CM | POA: Diagnosis not present

## 2016-07-10 DIAGNOSIS — M3131 Wegener's granulomatosis with renal involvement: Secondary | ICD-10-CM | POA: Diagnosis present

## 2016-07-10 DIAGNOSIS — G4733 Obstructive sleep apnea (adult) (pediatric): Secondary | ICD-10-CM | POA: Diagnosis present

## 2016-07-10 DIAGNOSIS — I2699 Other pulmonary embolism without acute cor pulmonale: Secondary | ICD-10-CM | POA: Diagnosis not present

## 2016-07-10 DIAGNOSIS — R9431 Abnormal electrocardiogram [ECG] [EKG]: Secondary | ICD-10-CM | POA: Diagnosis not present

## 2016-07-10 DIAGNOSIS — R0902 Hypoxemia: Secondary | ICD-10-CM | POA: Diagnosis not present

## 2016-07-10 DIAGNOSIS — F329 Major depressive disorder, single episode, unspecified: Secondary | ICD-10-CM | POA: Diagnosis present

## 2016-07-10 DIAGNOSIS — N179 Acute kidney failure, unspecified: Secondary | ICD-10-CM | POA: Diagnosis not present

## 2016-07-10 DIAGNOSIS — Z794 Long term (current) use of insulin: Secondary | ICD-10-CM | POA: Diagnosis not present

## 2016-07-10 DIAGNOSIS — I4581 Long QT syndrome: Secondary | ICD-10-CM | POA: Diagnosis present

## 2016-07-10 DIAGNOSIS — E860 Dehydration: Secondary | ICD-10-CM | POA: Diagnosis not present

## 2016-07-10 DIAGNOSIS — I471 Supraventricular tachycardia: Secondary | ICD-10-CM | POA: Diagnosis present

## 2016-07-10 LAB — CBC
HCT: 25.3 % — ABNORMAL LOW (ref 36.0–46.0)
HEMOGLOBIN: 8.6 g/dL — AB (ref 12.0–15.0)
MCH: 34 pg (ref 26.0–34.0)
MCHC: 34 g/dL (ref 30.0–36.0)
MCV: 100 fL (ref 78.0–100.0)
Platelets: 124 10*3/uL — ABNORMAL LOW (ref 150–400)
RBC: 2.53 MIL/uL — AB (ref 3.87–5.11)
RDW: 14.8 % (ref 11.5–15.5)
WBC: 3.1 10*3/uL — AB (ref 4.0–10.5)

## 2016-07-10 LAB — BASIC METABOLIC PANEL
Anion gap: 7 (ref 5–15)
BUN: 12 mg/dL (ref 6–20)
CALCIUM: 8.5 mg/dL — AB (ref 8.9–10.3)
CHLORIDE: 107 mmol/L (ref 101–111)
CO2: 23 mmol/L (ref 22–32)
CREATININE: 0.97 mg/dL (ref 0.44–1.00)
GFR calc non Af Amer: >60 mL/min (ref >60)
Glucose, Bld: 139 mg/dL — ABNORMAL HIGH (ref 65–99)
POTASSIUM: 4.7 mmol/L (ref 3.5–5.1)
SODIUM: 137 mmol/L (ref 135–145)

## 2016-07-10 LAB — HEPARIN LEVEL (UNFRACTIONATED): Heparin Unfractionated: >2.2 IU/mL — ABNORMAL HIGH (ref 0.30–0.70)

## 2016-07-10 LAB — APTT: aPTT: 38 seconds — ABNORMAL HIGH (ref 24–36)

## 2016-07-10 MED ORDER — WARFARIN VIDEO: Freq: Once | Status: DC

## 2016-07-10 MED ORDER — DARIFENACIN HYDROBROMIDE ER 7.5 MG PO TB24
7.5000 mg | ORAL_TABLET | Freq: Every day | ORAL | Status: DC
  Administered 2016-07-10 – 2016-07-21 (×10): 7.5 mg via ORAL
  Filled 2016-07-10 (×10): qty 1

## 2016-07-10 MED ORDER — METOPROLOL TARTRATE 25 MG PO TABS
25.0000 mg | ORAL_TABLET | Freq: Two times a day (BID) | ORAL | Status: DC
  Administered 2016-07-10 – 2016-07-23 (×21): 25 mg via ORAL
  Filled 2016-07-10: qty 2
  Filled 2016-07-10 (×22): qty 1

## 2016-07-10 MED ORDER — WARFARIN - PHARMACIST DOSING INPATIENT: Freq: Every day | Status: DC

## 2016-07-10 MED ORDER — SODIUM CHLORIDE 0.9 % IV SOLN
INTRAVENOUS | Status: DC
  Administered 2016-07-10 – 2016-07-15 (×6): via INTRAVENOUS

## 2016-07-10 MED ORDER — COUMADIN BOOK
Freq: Once | Status: DC
  Filled 2016-07-10: qty 1

## 2016-07-10 MED ORDER — SERTRALINE HCL 100 MG PO TABS
100.0000 mg | ORAL_TABLET | Freq: Every day | ORAL | Status: DC
  Administered 2016-07-10 – 2016-07-22 (×13): 100 mg via ORAL
  Filled 2016-07-10 (×13): qty 1

## 2016-07-10 MED ORDER — HEPARIN (PORCINE) IN NACL 100-0.45 UNIT/ML-% IJ SOLN
1450.0000 [IU]/h | INTRAMUSCULAR | Status: DC
  Administered 2016-07-10: 1300 [IU]/h via INTRAVENOUS
  Filled 2016-07-10 (×2): qty 250

## 2016-07-10 MED ORDER — WARFARIN SODIUM 7.5 MG PO TABS
7.5000 mg | ORAL_TABLET | Freq: Once | ORAL | Status: DC
Start: 2016-07-10 — End: 2016-07-10

## 2016-07-10 NOTE — Progress Notes (Addendum)
ANTICOAGULATION CONSULT NOTE  Pharmacy Consult for Xarelto > heparin Indication: DVT  Heparin Dosing Weight: 75.7 kg   Assessment: 60 yof with new DVT. CTA negative for PE. Originally started on Lovenox and transitioned to Xarelto on 4/25. Now Pharmacy consulted to transition to heparin and appears Vascular to see patient. Xarelto on hold for now. Hg trending down to 8.6, plt up 124. No bleed documented.  Last dose of Xarelto given at ~0800 this AM. Will schedule heparin  to start tonight when next dose would be due. Will follow aPTTs while Xarelto expected to influence heparin levels.  Goal of Therapy:  Heparin level 0.3-0.7 units/ml aPTT 66-102 seconds Monitor platelets by anticoagulation protocol: Yes   Plan:  Baseline aPTT/heparin level Start heparin at 1300 units/h at 2000 tonight (no bolus) 6h aPTT Daily heparin level/aPTT/CBC Monitor for s/sx bleeding Xarelto on hold for now   Babs Bertin, PharmD, BCPS Clinical Pharmacist 07/10/2016 12:56 PM

## 2016-07-10 NOTE — Progress Notes (Addendum)
PROGRESS NOTE    Cassandra [name]  PJA:[id number] DOB: May 30, 1955 DOA: 07/08/2016 PCP: No PCP Per Patient   Outpatient Specialists:    Brief Narrative:  Cassandra [name] is a 61 y.o. 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 Over the last week patient notes that she's been more fatigued than usual reporting decreased overall po intake. This morning noted right leg swelling noting that it was significantly bigger than her left. Associated symptoms included intermittent chills, nonproductive cough, loose stools for the last 2 days, and decreased energy. At baseline patient notes that she's not very mobile. Denies any significant redness of skin, rash, fever, calf pain, chest pain, recent antibiotics, travel, or blood in stool/urine. The only other change is noted was Dr. Milinda Antis of rheumatology was decreasing the amount of prednisone monthly. This month she decreased from 40 mg prednisone daily to 30.   Assessment & Plan:   Principal Problem:   AKI (acute kidney injury) ([place]) Active Problems:   GERD (gastroesophageal reflux disease)   Positive ANA (antinuclear antibody)   Right leg swelling   Dehydration   Anemia   Prolonged QT interval   DVT (deep venous thrombosis) (HCC)  DVT of RLE -extensive swelling on right leg -vascular checks -elevate leg -heparin gtt and Dr. Donzetta Matters will see in AM to see if thrombolytics would be beneficial -CTA negative for PE -pain worsened with movement and HR went up to 140s  Acute kidney injury on chronic kidney disease stage III:  -Urinalysis shows high specific gravity to suggest dehydration.  - IVF NS at 100 ml/hr -BMP in AM  Dehydration -Cr improving -resolved with IVF  Hypokalemia -replete PO -check Mg  Essential hypertension - Hold lisinopril  and furosemide 2/2 AKI  Prolonged QTC: Patient's initial QTC is noted to be 605 - Avoid further QT prolonging medications. - improved on AM EKG to < 450  P-ANCA/Wagner granulomatosis - Check ESR- lower than baseline - Continue prednisone  Anemia: Hemoglobin 10.9 on admission - Continue to monitor  Thrombocytopenia: Acute. Platelet count dropped to 113. No previous history of low platelets counts previously noted in the past. - monitor -? Medication side effect?  [name] - protonix  Patient had episode of SVT in the PM of 4/26-- resolved with vagal maneuver (blowing in straw)-- added back PRN BB if BP can tolerate   DVT prophylaxis:  Fully anticoagulated- heparin gtt to be seen by Dr. Donzetta Matters   Code Status: Full Code   Family Communication:   Disposition Plan:    Consultants:   vascular     Subjective: Nausea better -HR went to 140s with exertion  Objective: Vitals:   07/09/16 0500 07/09/16 1243 07/09/16 2115 07/10/16 0431  BP: (!) 127/57 (!) 113/55 126/65 114/73  Pulse: 79 84 74 78  Resp: _0 Temp: 97.5 F (36.4 C) 97.7 F (36.5 C) 97.7 F (36.5 C) 97.4 F (36.3 C)  TempSrc: Oral Axillary Oral Oral  SpO2: 94% 100% 95% 97%  Weight:    109.5 kg (241 lb 6.4 oz)  Height:        Intake/Output Summary (Last 24 hours) at 07/10/16 1231 Last data filed at 07/10/16 0830  Gross per 24 hour  Intake             3730 ml  Output             1300 ml  Net             2430 ml   Filed Weights   07/08/16 1847 07/09/16 0140 07/10/16 0431  Weight: 107.5 kg (237 lb) 106.3 kg (234 lb 6.4 oz) 109.5 kg (241 lb 6.4 oz)    Examination:  General exam: in bed, eating breakfast Respiratory system: Clear to auscultation. Respiratory effort normal. Cardiovascular system: S1 & S2 heard, RRR. No JVD, murmurs, rubs, gallops or clicks.. Gastrointestinal system: Abdomen is nondistended, soft and nontender. No organomegaly or masses felt. Normal bowel sounds  heard. Central nervous system: Alert and oriented. No focal neurological deficits. Right leg swollen, pulses palpable, + edema but warmth same as other leg    Data Reviewed: I have personally reviewed following labs and imaging studies  CBC:  Recent Labs Lab 07/08/16 1920 07/09/16 0824 07/10/16 0354  WBC 6.4 4.8 3.1*  HGB 10.9* 9.3* 8.6*  HCT 30.8* 26.8* 25.3*  MCV 98.4 100.4* 100.0  PLT 113* 102* 124*   Basic Metabolic Panel:  Recent Labs Lab 07/08/16 1920 07/09/16 0244 07/10/16 0354  NA 137 136 137  K 3.8 3.2* 4.7  CL 102 104 107  CO2 21* 21* 23  GLUCOSE 154* 111* 139*  BUN 21* 16 12  CREATININE 1.60* 1.34* 0.97  CALCIUM 9.1 8.3* 8.5*  MG  --  2.1  --    GFR: Estimated Creatinine Clearance: 71.1 mL/min (by C-G formula based on SCr of 0.97 mg/dL). Liver Function Tests: No results for input(s): AST, ALT, ALKPHOS, BILITOT, PROT, ALBUMIN in the last 168 hours. No results for input(s): LIPASE, AMYLASE in the last 168 hours. No results for input(s): AMMONIA in the last 168 hours. Coagulation Profile:  Recent Labs Lab 07/09/16 0008  INR 1.16   Cardiac Enzymes: No results for input(s): CKTOTAL, CKMB, CKMBINDEX, TROPONINI in the last 168 hours. BNP (last 3 results) No results for input(s): PROBNP in the last 8760 hours. HbA1C: No results for input(s): HGBA1C in the last 72 hours. CBG: No results for input(s): GLUCAP in the last 168 hours. Lipid Profile: No results for input(s): CHOL, HDL, LDLCALC, TRIG, CHOLHDL, LDLDIRECT in the last 72 hours. Thyroid Function Tests: No results for input(s): TSH, T4TOTAL, FREET4, T3FREE, THYROIDAB in the last 72 hours. Anemia Panel: No results for input(s): VITAMINB12, FOLATE, FERRITIN, TIBC, IRON, RETICCTPCT in the last 72 hours. Urine analysis:    Component Value Date/Time   COLORURINE YELLOW 07/08/2016 2146   APPEARANCEUR HAZY (A) 07/08/2016 2146   LABSPEC 1.033 (H) 07/08/2016 2146   PHURINE 5.0 07/08/2016 2146    GLUCOSEU NEGATIVE 07/08/2016 2146   GLUCOSEU NEGATIVE 11/30/2008 1522   HGBUR MODERATE (A) 07/08/2016 2146   BILIRUBINUR NEGATIVE 07/08/2016 2146   KETONESUR NEGATIVE 07/08/2016 2146   PROTEINUR 30 (A) 07/08/2016 2146   UROBILINOGEN 1 06/02/2013 1524   NITRITE NEGATIVE 07/08/2016 2146   LEUKOCYTESUR NEGATIVE 07/08/2016 2146     )No results found for this or any previous visit (from the past 240 hour(s)).    Anti-infectives    None       Radiology Studies: Ct Angio Chest Pe W And/or Wo Contrast  Result Date: 07/08/2016 CLINICAL DATA:  Shortness of breath for 6 months. RIGHT lower extremity swelling beginning today. History of stage 3 kidney disease, hypertension, pulmonary vasculitis. EXAM: CT ANGIOGRAPHY CHEST WITH CONTRAST TECHNIQUE: Multidetector CT imaging of the chest was performed using the standard   protocol during bolus administration of intravenous contrast. Multiplanar CT image reconstructions and MIPs were obtained to evaluate the vascular anatomy. CONTRAST:  75 cc Isovue 370 COMPARISON:  Chest CT February 28, 2016 FINDINGS: Mild respiratory motion degraded examination CARDIOVASCULAR: Adequate contrast opacification of the pulmonary artery's. Main pulmonary artery is not enlarged. No pulmonary arterial filling defects to the level of the segmental branches. Respiratory motion limits assessment for subsegmental emboli. Heart size is mildly enlarged. Mild coronary artery calcifications. No pericardial effusion. Thoracic aorta is normal course and caliber, LEFT vertebral artery arises directly from the aortic arch, normal variant. MEDIASTINUM/NODES: No lymphadenopathy by CT size criteria. LUNGS/PLEURA: Tracheobronchial tree is patent, no pneumothorax. Scattered, increasing ground-glass opacities with heterogeneous lung attenuation. No pleural effusion or focal consolidation. UPPER ABDOMEN: Included view of the abdomen is nonacute. Small to moderate hiatal hernia. MUSCULOSKELETAL:  Visualized soft tissues and included osseous structures are nonacute. Mild degenerative change of the spine. Review of the MIP images confirms the above findings. IMPRESSION: No acute pulmonary embolism on this respiratory motion degraded examination. Heterogeneous lung attenuation and ground-glass opacities compatible with small airway disease, in part attributable to patient's known pulmonary vasculopathy. Electronically Signed   By: Elon Alas M.D.   On: 07/08/2016 22:15        Scheduled Meds: . cyclophosphamide  150 mg Oral Daily  . pantoprazole  40 mg Oral Daily  . predniSONE  30 mg Oral Daily  . Rivaroxaban  15 mg Oral BID WC   Continuous Infusions: . sodium chloride Stopped (07/10/16 0800)     LOS: 0 days    Time spent: 25 min    [place], DO [facility] Pager [phone]  If 7PM-7AM, please contact night-coverage [email or web address] Password [facility] 07/10/2016, 12:31 PM

## 2016-07-11 DIAGNOSIS — I82401 Acute embolism and thrombosis of unspecified deep veins of right lower extremity: Secondary | ICD-10-CM

## 2016-07-11 LAB — BASIC METABOLIC PANEL
ANION GAP: 8 (ref 5–15)
BUN: 13 mg/dL (ref 6–20)
CALCIUM: 8.5 mg/dL — AB (ref 8.9–10.3)
CO2: 23 mmol/L (ref 22–32)
CREATININE: 0.88 mg/dL (ref 0.44–1.00)
Chloride: 108 mmol/L (ref 101–111)
Glucose, Bld: 120 mg/dL — ABNORMAL HIGH (ref 65–99)
Potassium: 3.9 mmol/L (ref 3.5–5.1)
SODIUM: 139 mmol/L (ref 135–145)

## 2016-07-11 LAB — APTT
APTT: 101 s — AB (ref 24–36)
APTT: 51 s — AB (ref 24–36)
APTT: 168 s — AB (ref 24–36)

## 2016-07-11 LAB — CBC
HCT: 25.9 % — ABNORMAL LOW (ref 36.0–46.0)
Hemoglobin: 8.8 g/dL — ABNORMAL LOW (ref 12.0–15.0)
MCH: 34 pg (ref 26.0–34.0)
MCHC: 34 g/dL (ref 30.0–36.0)
MCV: 100 fL (ref 78.0–100.0)
PLATELETS: 124 10*3/uL — AB (ref 150–400)
RBC: 2.59 MIL/uL — AB (ref 3.87–5.11)
RDW: 14.8 % (ref 11.5–15.5)
WBC: 3.6 10*3/uL — AB (ref 4.0–10.5)

## 2016-07-11 LAB — HEPARIN LEVEL (UNFRACTIONATED): HEPARIN UNFRACTIONATED: 1.6 [IU]/mL — AB (ref 0.30–0.70)

## 2016-07-11 LAB — CORTISOL: Cortisol, Plasma: 3.8 ug/dL

## 2016-07-11 MED ORDER — CLONAZEPAM 1 MG PO TABS
1.0000 mg | ORAL_TABLET | Freq: Two times a day (BID) | ORAL | Status: DC | PRN
Start: 2016-07-11 — End: 2016-07-23
  Administered 2016-07-11 – 2016-07-15 (×5): 1 mg via ORAL
  Filled 2016-07-11 (×5): qty 1

## 2016-07-11 NOTE — Progress Notes (Signed)
Received call from lab with critical aPTT of 168. Pt is on Heparin gtt and pharmacy is dosing. Called pharmacy and advised Tammy Sours of the critical aPTT. He said they will review the labs and will advise if Heparin rate needs to be decreased.

## 2016-07-11 NOTE — Progress Notes (Signed)
ANTICOAGULATION CONSULT NOTE - Follow Up Consult  Pharmacy Consult for Heparin (Xarelto on hold) Indication: DVT   Patient Measurements: Height:  (157.5 cm) Weight: 241 lb 6.4 oz (109.5 kg) IBW/kg (Calculated) : 50.1 Vital Signs:  Temp: 97.6 F (36.4 C) (04/27 0322) Temp Source: Oral (04/27 0322) BP: 131/55 (04/27 0322) Pulse Rate: 63 (04/27 0322)  Labs:  Recent Labs  07/09/16 0008 07/09/16 0244 07/09/16 0824 07/10/16 0354 07/10/16 1429 07/11/16 0444  HGB  --   --  9.3* 8.6*  --  8.8*  HCT  --   --  26.8* 25.3*  --  25.9*  PLT  --   --  102* 124*  --  124*  APTT 20*  --   --   --  38* 101*  LABPROT 14.9  --   --   --   --   --   INR 1.16  --   --   --   --   --   HEPARINUNFRC  --   --   --   --  >2.20*  --   CREATININE  --  1.34*  --  0.97  --  0.88    Estimated Creatinine Clearance: 78.3 mL/min (by C-G formula based on SCr of 0.88 mg/dL).   Assessment: On heparin for DVT, got some Xarelto 4/26 so using aPTT to dose for now given Xarelto influence on anti-Xa levels, aPTT is theraeutic this AM.   Goal of Therapy:  Heparin level 0.3-0.7 units/ml aPTT 66-102 seconds Monitor platelets by anticoagulation protocol: Yes   Plan:  -Cont heparin 1300 units/hr -1300 aPTT  Abran Duke 07/11/2016,5:47 AM

## 2016-07-11 NOTE — Consult Note (Signed)
Hospital Consult    Reason for Consult:  rle dvt Referring Physician:  Dr. Benjamine Mola MRN #:  [id number]  History of Present Illness: This is a 61 y.o. female with history of Wegner's and an ankle vasculitis. She presented with a one-day history of right lower extremity DVT that she states started Tuesday of this week. She does not have a history of DVT. She was having extensive pain but that has improved with heparin. She still has significant swelling that is causing her right lower extremity pain. She has not been hospitalized recently but hasn't been feeling generally unwell being treated with steroids and Cytoxan for her Wegner's. She denies any history of stroke or MI. She does not have any recent surgery and does not have any recent bleeding although on presentation her platelets have been low as has her hematocrit. She did have an episode of hemoptysis in January but that has resolved. Other than right lower extremity pain she has no complaints today.  Past Medical History:  Diagnosis Date  . Allergy   . Anxiety   . Complication of anesthesia    difficulty waking up  . Depression   . Dysrhythmia    svt  . GERD (gastroesophageal reflux disease)   . Heart murmur    never seen cardiologist for this  . Hypertension   . Leukocytoclastic vasculitis (HCC)    Dr. Marchelle Gearing and Dr. Corliss Skains  . Migraines    maybe one a year  . Renal disorder   . Renal insufficiency   . Sleep apnea    cpap  . SVT (supraventricular tachycardia) (HCC)   . Wegner's disease (congenital syphilitic osteochondritis)     Past Surgical History:  Procedure Laterality Date  . BUNIONECTOMY Bilateral   . CARPAL TUNNEL RELEASE Right 2002  . JOINT REPLACEMENT Right    knee  . TONSILLECTOMY  08/2002  . TOTAL KNEE ARTHROPLASTY Right   . TOTAL KNEE ARTHROPLASTY Left 02/21/2014   Procedure: LEFT TOTAL KNEE ARTHROPLASTY;  Surgeon: Velna Ochs, MD;  Location: [facility] OR;  Service: Orthopedics;  Laterality: Left;  .  TUBAL LIGATION  1980    Allergies  Allergen Reactions  . Ampicillin     Rash   . Clarithromycin     unknown  . Codeine     Head feels like its crawling  . Effexor [Venlafaxine]     Hives   . Flagyl [Metronidazole]     unknown  . Fluoxetine Hcl Itching    Deep Itch   . Myrbetriq [Mirabegron]     Caused back pai, dry mouth  . Nsaids Other (See Comments)    Hypersensitivity vasculitis  . Paxil [Paroxetine]   . Reglan [Metoclopramide]     Blister in mouth   . Sulfa Antibiotics     Stomach irritation  . Tetracycline     Severe stomach pain    Prior to Admission medications   Medication Sig Start Date End Date Taking? Authorizing Provider  cyclophosphamide (CYTOXAN) 50 MG capsule Take 150 mg by mouth daily. 03/27/16  Yes Historical Provider, MD  DEXILANT 30 MG capsule Take 30 mg by mouth daily. 06/25/16  Yes Historical Provider, MD  furosemide (LASIX) 20 MG tablet Take 20 mg by mouth daily. 06/25/16  Yes Historical Provider, MD  lisinopril (PRINIVIL,ZESTRIL) 5 MG tablet Take 5 mg by mouth daily.   Yes Historical Provider, MD  metoprolol (LOPRESSOR) 25 MG tablet Take 1 tablet twice a day Patient taking differently: Take 25 mg by  mouth 2 (two) times daily.  04/15/16  Yes Jacalyn Lefevre, MD  predniSONE (DELTASONE) 10 MG tablet Take 30 mg by mouth daily.  04/01/16  Yes Historical Provider, MD  sertraline (ZOLOFT) 100 MG tablet TAKE 1 TABLET BY MOUTH EVERY NIGHT AT BEDTIME 01/31/14  Yes Donita Brooks, MD  VESICARE 5 MG tablet Take 5 mg by mouth daily. 04/14/16  Yes Historical Provider, MD  hydrocortisone (ANUSOL-HC) 25 MG suppository Place 1 suppository (25 mg total) rectally 2 (two) times daily. Patient not taking: Reported on 07/08/2016 03/19/16   Ramonita Lab, MD  hydrOXYzine (ATARAX/VISTARIL) 25 MG tablet Take 1 tablet (25 mg total) by mouth 3 (three) times daily as needed for itching. Patient not taking: Reported on 07/08/2016 03/19/16   Ramonita Lab, MD  metFORMIN (GLUCOPHAGE) 500 MG  tablet Take 1 tablet (500 mg total) by mouth 2 (two) times daily with a meal. Patient not taking: Reported on 07/08/2016 03/19/16   Ramonita Lab, MD  prednisoLONE 5 MG TABS tablet Take 4 tablets (20 mg total) by mouth 3 (three) times daily. take 4 tablets by mouth 3 times a day Patient not taking: Reported on 07/08/2016 03/19/16   Ramonita Lab, MD    Social History   Social History  . Marital status: Divorced    Spouse name: N/A  . Number of children: N/A  . Years of education: N/A   Occupational History  . Unemployed    Social History Main Topics  . Smoking status: Never Smoker  . Smokeless tobacco: Never Used  . Alcohol use No  . Drug use: No  . Sexual activity: Not on file   Other Topics Concern  . Not on file   Social History Narrative  . No narrative on file     Family History  Problem Relation Age of Onset  . Clotting disorder Mother 56    Blood clot, foot amputation  . Cervical cancer Mother   . Arthritis Sister   . Heart attack Maternal Grandmother 51  . Breast cancer Sister   . Asthma Cousin     ROS:  Positive    Negative    All sytems reviewed and are negative  Cardiovascular:  chest pain/pressure  palpitations  SOB lying flat  DOE  pain in legs while walking  pain in legs at rest  pain in legs at night  non-healing ulcers  hx of DVT  swelling in legs  Pulmonary:  productive cough  asthma/wheezing  home O2  Neurologic:  weakness in  arms  legs  numbness in  arms  legs  hx of CVA  mini stroke difficulty speaking or slurred speech  temporary loss of vision in one eye  dizziness  Hematologic:  hx of cancer  bleeding problems  problems with blood clotting easily  Endocrine:    diabetes  thyroid disease  GI  vomiting blood  blood in stool  GU:  CKD/renal failure  HD--[]  M/W/F or  T/T/S  burning with urination  blood in urine  Psychiatric:   anxiety  depression  Musculoskeletal:  arthritis  joint pain  Integumentary:  rashes  ulcers  Constitutional:  fever  chills   Physical Examination  Vitals:   07/10/16 2021 07/11/16 0322  BP: (!) 101/53 (!) 131/55  Pulse: 74 63  Resp: 18 18  Temp: 98 F (36.7 C) 97.6 F (36.4 C)   Body mass index is 44.15 kg/m.  General:  WDWN in NAD Gait: Not observed HENT: WNL,  normocephalic Pulmonary: normal non-labored breathing Cardiac: palpable pedal and upper extremity pulses Abdomen: soft, NT/ND, no masses Skin: without rashes Extremities: rle with non-pitting edema throughout Musculoskeletal: no muscle wasting or atrophy  Neurologic: A&O X 3; Appropriate Affect ; SENSATION: normal; MOTOR FUNCTION:  moving all extremities equally. Speech is fluent/normal Psychiatric: appropriate mood and affect  CBC    Component Value Date/Time   WBC 3.6 (L) 07/11/2016 0444   RBC 2.59 (L) 07/11/2016 0444   HGB 8.8 (L) 07/11/2016 0444   HCT 25.9 (L) 07/11/2016 0444   PLT 124 (L) 07/11/2016 0444   MCV 100.0 07/11/2016 0444   MCH 34.0 07/11/2016 0444   MCHC 34.0 07/11/2016 0444   RDW 14.8 07/11/2016 0444   LYMPHSABS 1.9 03/13/2016 1837   MONOABS 0.8 03/13/2016 1837   EOSABS 0.1 03/13/2016 1837   BASOSABS 0.1 03/13/2016 1837    BMET    Component Value Date/Time   NA 139 07/11/2016 0444   K 3.9 07/11/2016 0444   CL 108 07/11/2016 0444   CO2 23 07/11/2016 0444   GLUCOSE 120 (H) 07/11/2016 0444   BUN 13 07/11/2016 0444   CREATININE 0.88 07/11/2016 0444   CREATININE 0.76 06/02/2013 1510   CALCIUM 8.5 (L) 07/11/2016 0444   GFRNONAA >60 07/11/2016 0444   GFRNONAA 87 06/02/2013 1510   GFRAA >60 07/11/2016 0444   GFRAA >89 06/02/2013 1510    COAGS: Lab Results  Component Value Date   INR 1.16 07/09/2016   INR 1.03 03/18/2016   INR 1.00 03/15/2016     Non-Invasive Vascular Imaging:   Summary:  - Findings consistent with acute deep vein thrombosis  involving the   right iliac vein, common femoral vein, right femoral vein, right   popliteal vein, and right posterior tibial veins. - No evidence of deep vein thrombosis involving the left lower   extremity.   ASSESSMENT/PLAN: This is a 61 y.o. female with extensive right lower extremity DVT without risk factors other than recent overall illness at home. She does not have a history of DVT nor does she have a history of radiation or surgery to her abdomen. I discussed with her proceeding with pharmacal mechanical thrombectomy with possible lysis possible stenting femoral-popliteal approach of her right lower extremity. She seemed to have good understanding of this including the risks of stroke bleeding pulmonary embolism renal failure. The goal this would to be alleviate her current symptoms as well as prevent post-thrombotic syndrome which she would have a real risk of in the next 2 years. She demonstrates good understanding. She should maintain on heparin drip over the weekend and we will watch her labs closely to make sure she does not have any further bleeding and that her platelets rebounded from her admission. She should also remain on IV fluids for hydration over the weekend. We can rediscuss the risk and benefits with her Monday morning and plan for procedure Monday afternoon. Thank you for allowing [place] to participate in the care of Ms. Barna.  Rachard Isidro C. Randie Heinz, MD [facility] Office: [phone] Pager: [phone]

## 2016-07-11 NOTE — Progress Notes (Signed)
ANTICOAGULATION CONSULT NOTE - Follow Up Consult  Pharmacy Consult for Heparin Indication: DVT  Patient Measurements: Height:  (157.5 cm) Weight: 241 lb 6.4 oz (109.5 kg) IBW/kg (Calculated) : 50.1 Heparin Dosing Weight: 78 kg  Vital Signs: Temp: 99.3 F (37.4 C) (04/27 1324) Temp Source: Oral (04/27 1324) BP: 96/45 (04/27 1324) Pulse Rate: 102 (04/27 1324)  Labs:  Recent Labs  07/09/16 0008 07/09/16 0244 07/09/16 0824 07/10/16 0354 07/10/16 1429 07/11/16 0444 07/11/16 1018  HGB  --   --  9.3* 8.6*  --  8.8*  --   HCT  --   --  26.8* 25.3*  --  25.9*  --   PLT  --   --  102* 124*  --  124*  --   APTT 20*  --   --   --  38* 101* 51*  LABPROT 14.9  --   --   --   --   --   --   INR 1.16  --   --   --   --   --   --   HEPARINUNFRC  --   --   --   --  >2.20* 1.60*  --   CREATININE  --  1.34*  --  0.97  --  0.88  --     Estimated Creatinine Clearance: 78.3 mL/min (by C-G formula based on SCr of 0.88 mg/dL).  Assessment:  21 yof with right leg DVT. CTA negative for PE. Originally started on Lovenox and transitioned to Xarelto on 4/25. Received 2 dose of Xarelto, then changed to IV heparin on 4/26. For mechanical thrombectomy and possible lysis/stenting on 4/30 in [facility].   Initial aPTT was therapeutic (101 seconds) on 1300 units/hr, but aPTT down to 51 second on recheck ~6 hr later. No known infusion problems. Using aPTTs for heparin monitoring while recent Xarelto doses effecting heparin levels  Goal of Therapy:  Heparin level 0.3-0.7 units/ml aPTT 66-102 seconds Monitor platelets by anticoagulation protocol: Yes   Plan:   Increase heparin drip to 1450 units/hr.  aPTT ~6 hrs after dose change.  Daily aPTT, heparin level and CBC.  Dennie Fetters, [place] Pager: [phone] 07/11/2016,2:16 PM

## 2016-07-11 NOTE — Progress Notes (Addendum)
PROGRESS NOTE    Cassandra [name]  NKN:[id number] DOB: [birth year] DOA: 07/08/2016 PCP: No PCP Per Patient   Outpatient Specialists:    Brief Narrative:  Cassandra [name] is a 61 y.o. 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  . The only other change is noted was Dr. Milinda Antis of rheumatology was decreasing the amount of prednisone monthly. This month she decreased from 40 mg prednisone daily to 30.She presented with a one-day history of right lower extremity DVT .   Assessment & Plan:   Principal Problem:   AKI (acute kidney injury) (May) Active Problems:   GERD (gastroesophageal reflux disease)   Positive ANA (antinuclear antibody)   Right leg swelling   Dehydration   Anemia   Prolonged QT interval   DVT (deep venous thrombosis) (HCC)  DVT of RLE -extensive swelling on right leg Vascular surgery consulted-plan is to proceed with pharmacal mechanical thrombectomy  with possible lysis possible stenting femoral-popliteal approach of her right lower extremity Continue heparin gtt , appreciate Dr. Donzetta Matters recommendations Procedure likely Monday afternoon  Acute kidney injury on chronic kidney disease stage III: Resolved -Urinalysis shows high specific gravity to suggest dehydration.  - IVF NS at 100 ml/hr Creatinine down to 0.88   Hypokalemia Repleted  Essential hypertension - Hold lisinopril and furosemide 2/2 AKI  Prolonged QTC: Patient's initial QTC is noted to be 605 - Avoid further QT prolonging medications. - improved on AM EKG to < 450  P-ANCA/Wagner granulomatosis - Check ESR- lower than baseline - Continue prednisone, may need stress dose steroids  Anemia: Hemoglobin 10.9 on admission, now 8.8 Will need to be transfuse for hemoglobin less than 8.0 Check  FOBT  Thrombocytopenia: Acute. Platelet count dropped to 113. No previous history of low platelets counts previously noted in the past. Previously her platelet count has been between 175-243    [name] - protonix  Sinus tachycardia/SVT on 4/26-- resolved with vagal maneuver (blowing in straw)-- added back PRN BB if BP can tolerate, blood pressure soft Klonopin for anxiety   DVT prophylaxis: Heparin drip     Code Status: Full Code   Family Communication:   Disposition Plan: Vascular procedure next week   Consultants:   vascular     Subjective: Anxious about procedure on Monday   Objective: Vitals:   07/10/16 1421 07/10/16 1445 07/10/16 2021 07/11/16 0322  BP: 101/66 (!) 115/58 (!) 101/53 (!) 131/55  Pulse: 92 99 74 63  Resp:   18 18  Temp:   98 F (36.7 C) 97.6 F (36.4 C)  TempSrc:   Oral Oral  SpO2:   100% 99%  Weight:      Height:        Intake/Output Summary (Last 24 hours) at 07/11/16 0928 Last data filed at 07/11/16 0901  Gross per 24 hour  Intake           980.67 ml  Output             2750 ml  Net         -1769.33 ml   Filed Weights   07/08/16 1847 07/09/16 0140 07/10/16 0431  Weight: 107.5 kg (237 lb) 106.3 kg (234 lb 6.4 oz) 109.5 kg (241 lb 6.4 oz)    Examination:  General exam: in bed, eating breakfast Respiratory system: Clear  to auscultation. Respiratory effort normal. Cardiovascular system: S1 & S2 heard, RRR. No JVD, murmurs, rubs, gallops or clicks.. Gastrointestinal system: Abdomen is nondistended, soft and nontender. No organomegaly or masses felt. Normal bowel sounds heard. Central nervous system: Alert and oriented. No focal neurological deficits. Right leg swollen, pulses palpable, + edema but warmth same as other leg    Data Reviewed: I have personally reviewed following labs and imaging studies  CBC:  Recent Labs Lab 07/08/16 1920 07/09/16 0824 07/10/16 0354 07/11/16 0444  WBC 6.4 4.8 3.1* 3.6*  HGB 10.9* 9.3*  8.6* 8.8*  HCT 30.8* 26.8* 25.3* 25.9*  MCV 98.4 100.4* 100.0 100.0  PLT 113* 102* 124* 734*   Basic Metabolic Panel:  Recent Labs Lab 07/08/16 1920 07/09/16 0244 07/10/16 0354 07/11/16 0444  NA 137 136 137 139  K 3.8 3.2* 4.7 3.9  CL 102 104 107 108  CO2 21* 21* 23 23  GLUCOSE 154* 111* 139* 120*  BUN 21* _0 CREATININE 1.60* 1.34* 0.97 0.88  CALCIUM 9.1 8.3* 8.5* 8.5*  MG  --  2.1  --   --    GFR: Estimated Creatinine Clearance: 78.3 mL/min (by C-G formula based on SCr of 0.88 mg/dL). Liver Function Tests: No results for input(s): AST, ALT, ALKPHOS, BILITOT, PROT, ALBUMIN in the last 168 hours. No results for input(s): LIPASE, AMYLASE in the last 168 hours. No results for input(s): AMMONIA in the last 168 hours. Coagulation Profile:  Recent Labs Lab 07/09/16 0008  INR 1.16   Cardiac Enzymes: No results for input(s): CKTOTAL, CKMB, CKMBINDEX, TROPONINI in the last 168 hours. BNP (last 3 results) No results for input(s): PROBNP in the last 8760 hours. HbA1C: No results for input(s): HGBA1C in the last 72 hours. CBG: No results for input(s): GLUCAP in the last 168 hours. Lipid Profile: No results for input(s): CHOL, HDL, LDLCALC, TRIG, CHOLHDL, LDLDIRECT in the last 72 hours. Thyroid Function Tests: No results for input(s): TSH, T4TOTAL, FREET4, T3FREE, THYROIDAB in the last 72 hours. Anemia Panel: No results for input(s): VITAMINB12, FOLATE, FERRITIN, TIBC, IRON, RETICCTPCT in the last 72 hours. Urine analysis:    Component Value Date/Time   COLORURINE YELLOW 07/08/2016 2146   APPEARANCEUR HAZY (A) 07/08/2016 2146   LABSPEC 1.033 (H) 07/08/2016 2146   PHURINE 5.0 07/08/2016 2146   GLUCOSEU NEGATIVE 07/08/2016 2146   GLUCOSEU NEGATIVE 11/30/2008 1522   HGBUR MODERATE (A) 07/08/2016 2146   BILIRUBINUR NEGATIVE 07/08/2016 2146   [place] 07/08/2016 2146   PROTEINUR 30 (A) 07/08/2016 2146   UROBILINOGEN 1 06/02/2013 1524   NITRITE NEGATIVE  07/08/2016 2146   LEUKOCYTESUR NEGATIVE 07/08/2016 2146     )No results found for this or any previous visit (from the past 240 hour(s)).    Anti-infectives    None       Radiology Studies: No results found.      Scheduled Meds: . cyclophosphamide  150 mg Oral Daily  . darifenacin  7.5 mg Oral Daily  . metoprolol tartrate  25 mg Oral BID  . pantoprazole  40 mg Oral Daily  . predniSONE  30 mg Oral Daily  . sertraline  100 mg Oral QHS   Continuous Infusions: . sodium chloride 100 mL/hr at 07/10/16 1447  . heparin 1,300 Units/hr (07/10/16 1936)     LOS: 1 day    Time spent: 25 min    Reyne Dumas, DO [facility] Pager [phone]  If 7PM-7AM, please contact night-coverage [email or web address] Password  [email or web address] 07/11/2016, 9:28 AM

## 2016-07-12 LAB — COMPREHENSIVE METABOLIC PANEL
ALBUMIN: 2.9 g/dL — AB (ref 3.5–5.0)
ALT: 33 U/L (ref 14–54)
ANION GAP: 6 (ref 5–15)
AST: 36 U/L (ref 15–41)
Alkaline Phosphatase: 56 U/L (ref 38–126)
BUN: 13 mg/dL (ref 6–20)
CO2: 23 mmol/L (ref 22–32)
Calcium: 8.5 mg/dL — ABNORMAL LOW (ref 8.9–10.3)
Chloride: 109 mmol/L (ref 101–111)
Creatinine, Ser: 0.87 mg/dL (ref 0.44–1.00)
GFR calc non Af Amer: >60 mL/min (ref >60)
GLUCOSE: 121 mg/dL — AB (ref 65–99)
POTASSIUM: 3.9 mmol/L (ref 3.5–5.1)
SODIUM: 138 mmol/L (ref 135–145)
TOTAL PROTEIN: 5.3 g/dL — AB (ref 6.5–8.1)
Total Bilirubin: 0.7 mg/dL (ref 0.3–1.2)

## 2016-07-12 LAB — CBC
HEMATOCRIT: 26.1 % — AB (ref 36.0–46.0)
Hemoglobin: 8.8 g/dL — ABNORMAL LOW (ref 12.0–15.0)
MCH: 33.8 pg (ref 26.0–34.0)
MCHC: 33.7 g/dL (ref 30.0–36.0)
MCV: 100.4 fL — ABNORMAL HIGH (ref 78.0–100.0)
Platelets: 134 10*3/uL — ABNORMAL LOW (ref 150–400)
RBC: 2.6 MIL/uL — ABNORMAL LOW (ref 3.87–5.11)
RDW: 14.8 % (ref 11.5–15.5)
WBC: 3.7 10*3/uL — ABNORMAL LOW (ref 4.0–10.5)

## 2016-07-12 LAB — HEPARIN LEVEL (UNFRACTIONATED): Heparin Unfractionated: 1.11 IU/mL — ABNORMAL HIGH (ref 0.30–0.70)

## 2016-07-12 LAB — APTT
APTT: 127 s — AB (ref 24–36)
APTT: 114 s — AB (ref 24–36)

## 2016-07-12 MED ORDER — HEPARIN (PORCINE) IN NACL 100-0.45 UNIT/ML-% IJ SOLN
1050.0000 [IU]/h | INTRAMUSCULAR | Status: DC
  Administered 2016-07-14: 1050 [IU]/h via INTRAVENOUS
  Filled 2016-07-12 (×2): qty 250

## 2016-07-12 MED ORDER — HEPARIN (PORCINE) IN NACL 100-0.45 UNIT/ML-% IJ SOLN: 1300.0000 [IU]/h | INTRAMUSCULAR | Status: DC

## 2016-07-12 MED ORDER — HEPARIN (PORCINE) IN NACL 100-0.45 UNIT/ML-% IJ SOLN
1150.0000 [IU]/h | INTRAMUSCULAR | Status: DC
  Filled 2016-07-12: qty 250

## 2016-07-12 NOTE — Progress Notes (Addendum)
ANTICOAGULATION CONSULT NOTE - Follow Up Consult  Pharmacy Consult for Heparin Indication: DVT  Patient Measurements: Height:  (157.5 cm) Weight: 241 lb 6.4 oz (109.5 kg) IBW/kg (Calculated) : 50.1 Heparin Dosing Weight: 78 kg  Vital Signs: Temp: 97.8 F (36.6 C) (04/28 0657) Temp Source: Oral (04/28 0657) BP: 128/61 (04/28 0657) Pulse Rate: 66 (04/28 0657)  Labs:  Recent Labs  07/10/16 0354  07/10/16 1429 07/11/16 0444 07/11/16 1018 07/11/16 2238 07/12/16 0338 07/12/16 0843  HGB 8.6*  --   --  8.8*  --   --  8.8*  --   HCT 25.3*  --   --  25.9*  --   --  26.1*  --   PLT 124*  --   --  124*  --   --  134*  --   APTT  --   < > 38* 101* 51* 168*  --  127*  HEPARINUNFRC  --   --  >2.20* 1.60*  --   --   --  1.11*  CREATININE 0.97  --   --  0.88  --   --  0.87  --   < > = values in this interval not displayed.  Estimated Creatinine Clearance: 79.2 mL/min (by C-G formula based on SCr of 0.87 mg/dL).   Medications: Heparin @ 1300 units/hr  Assessment: 60 yof with right leg DVT. CTA negative for PE. Originally started on Lovenox and transitioned to Xarelto on 4/25. Received 2 doses of Xarelto, then changed to IV heparin on 4/26. For mechanical thrombectomy and possible lysis/stenting on 4/30 in [facility].  Heparin infusion held this morning for an hour and then resumed at a lower rate. Heparin level and aPTT remain elevated but trending down at 1.11 and 127 seconds, respectively. CBC stable. No bleeding.   Goal of Therapy:  Heparin level 0.3-0.7 units/ml aPTT 66-102 seconds Monitor platelets by anticoagulation protocol: Yes   Plan:  1) Decrease heparin to 1150 units/hr 2) Check 6 hour aPTT  Fredrik Rigger, PharmD 07/12/2016,11:28 AM   ADDENDUM: APTT remains elevated but trending down to 114 seconds. Decrease heparin further to 1050 units/hr and follow up daily aPTT and heparin level.  Fredrik Rigger, PharmD 07/12/2016, 8:09 PM

## 2016-07-12 NOTE — Progress Notes (Signed)
PROGRESS NOTE    Cassandra [name]  CXK:[id number] DOB: 07-Mar-1956 DOA: 07/08/2016 PCP: No PCP Per Patient   Outpatient Specialists:    Brief Narrative:  Cassandra [name] is a 61 y.o. 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  . The only other change is noted was Dr. Milinda Antis of rheumatology was decreasing the amount of prednisone monthly. This month she decreased from 40 mg prednisone daily to 30.She presented with a one-day history of right lower extremity DVT .   Assessment & Plan:   Principal Problem:   AKI (acute kidney injury) ([place]) Active Problems:   GERD (gastroesophageal reflux disease)   Positive ANA (antinuclear antibody)   Right leg swelling   Dehydration   Anemia   Prolonged QT interval   DVT (deep venous thrombosis) (HCC)  DVT of RLE -extensive swelling on right leg Vascular surgery consulted-plan is to proceed with pharmacal mechanical thrombectomy  with possible lysis possible stenting femoral-popliteal approach of her right lower extremity Continue heparin gtt , appreciate Dr. Donzetta Matters recommendations Procedure likely Monday afternoon  Acute kidney injury on chronic kidney disease stage III: Resolved -Urinalysis shows high specific gravity to suggest dehydration.  - IVF NS at 100 ml/hr Creatinine down to 0.88 She should also remain on IV fluids for hydration over the weekend   Hypokalemia Repleted  Essential hypertension - Hold lisinopril and furosemide 2/2 AKI  Prolonged QTC: Patient's initial QTC is noted to be 605 - Avoid further QT prolonging medications. 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- lower than baseline  Continue prednisone, may need stress dose steroids prior to procedure 4/30   Anemia: Hemoglobin 10.9 on  admission, now 8.8 Will need to be transfuse for hemoglobin less than 8.0 Check FOBT  Thrombocytopenia:   Improving, now 134 k   No previous history of low platelets counts previously noted in the past. Previously her platelet count has been between 175-243    Cassandra [name] - protonix  Sinus tachycardia/SVT on 4/26-- resolved with vagal maneuver (blowing in straw)-- added back PRN BB if BP can tolerate, blood pressure soft Klonopin for anxiety   DVT prophylaxis: Heparin drip     Code Status: Full Code   Family Communication:   Disposition Plan: Vascular procedure next week   Consultants:   vascular     Subjective: Anxious about procedure on Monday   Objective: Vitals:   07/11/16 1324 07/11/16 2052 07/11/16 2204 07/12/16 0657  BP: (!) 96/45 (!) 100/58  128/61  Pulse: (!) 102 63 (!) 56 66  Resp: 18 16  18   Temp: 99.3 F (37.4 C) 98 F (36.7 C)  97.8 F (36.6 C)  TempSrc: Oral Oral  Oral  SpO2: 96% 97%  97%  Weight:      Height:        Intake/Output Summary (Last 24 hours) at 07/12/16 0854 Last data filed at 07/11/16 2223  Gross per 24 hour  Intake              779 ml  Output             2400 ml  Net            -1621 ml   Filed Weights   07/08/16 1847 07/09/16 0140 07/10/16 0431  Weight: 107.5  kg (237 lb) 106.3 kg (234 lb 6.4 oz) 109.5 kg (241 lb 6.4 oz)    Examination:  General exam: in bed, eating breakfast Respiratory system: Clear to auscultation. Respiratory effort normal. Cardiovascular system: S1 & S2 heard, RRR. No JVD, murmurs, rubs, gallops or clicks.. Gastrointestinal system: Abdomen is nondistended, soft and nontender. No organomegaly or masses felt. Normal bowel sounds heard. Central nervous system: Alert and oriented. No focal neurological deficits. Right leg swollen, pulses palpable, + edema but warmth same as other leg    Data Reviewed: I have personally reviewed following labs and imaging studies  CBC:  Recent Labs Lab  07/08/16 1920 07/09/16 0824 07/10/16 0354 07/11/16 0444 07/12/16 0338  WBC 6.4 4.8 3.1* 3.6* 3.7*  HGB 10.9* 9.3* 8.6* 8.8* 8.8*  HCT 30.8* 26.8* 25.3* 25.9* 26.1*  MCV 98.4 100.4* 100.0 100.0 100.4*  PLT 113* 102* 124* 124* 254*   Basic Metabolic Panel:  Recent Labs Lab 07/08/16 1920 07/09/16 0244 07/10/16 0354 07/11/16 0444 07/12/16 0338  NA 137 136 137 139 138  K 3.8 3.2* 4.7 3.9 3.9  CL 102 104 107 108 109  CO2 21* 21* 23 23 23   GLUCOSE 154* 111* 139* 120* 121*  BUN 21* 16 12 13 13   CREATININE 1.60* 1.34* 0.97 0.88 0.87  CALCIUM 9.1 8.3* 8.5* 8.5* 8.5*  MG  --  2.1  --   --   --    GFR: Estimated Creatinine Clearance: 79.2 mL/min (by C-G formula based on SCr of 0.87 mg/dL). Liver Function Tests:  Recent Labs Lab 07/12/16 0338  AST 36  ALT 33  ALKPHOS 56  BILITOT 0.7  PROT 5.3*  ALBUMIN 2.9*   No results for input(s): LIPASE, AMYLASE in the last 168 hours. No results for input(s): AMMONIA in the last 168 hours. Coagulation Profile:  Recent Labs Lab 07/09/16 0008  INR 1.16   Cardiac Enzymes: No results for input(s): CKTOTAL, CKMB, CKMBINDEX, TROPONINI in the last 168 hours. BNP (last 3 results) No results for input(s): PROBNP in the last 8760 hours. HbA1C: No results for input(s): HGBA1C in the last 72 hours. CBG: No results for input(s): GLUCAP in the last 168 hours. Lipid Profile: No results for input(s): CHOL, HDL, LDLCALC, TRIG, CHOLHDL, LDLDIRECT in the last 72 hours. Thyroid Function Tests: No results for input(s): TSH, T4TOTAL, FREET4, T3FREE, THYROIDAB in the last 72 hours. Anemia Panel: No results for input(s): VITAMINB12, FOLATE, FERRITIN, TIBC, IRON, RETICCTPCT in the last 72 hours. Urine analysis:    Component Value Date/Time   COLORURINE YELLOW 07/08/2016 2146   APPEARANCEUR HAZY (A) 07/08/2016 2146   LABSPEC 1.033 (H) 07/08/2016 2146   PHURINE 5.0 07/08/2016 2146   GLUCOSEU NEGATIVE 07/08/2016 2146   GLUCOSEU NEGATIVE  11/30/2008 1522   HGBUR MODERATE (A) 07/08/2016 2146   BILIRUBINUR NEGATIVE 07/08/2016 2146   [place] 07/08/2016 2146   PROTEINUR 30 (A) 07/08/2016 2146   UROBILINOGEN 1 06/02/2013 1524   NITRITE NEGATIVE 07/08/2016 2146   LEUKOCYTESUR NEGATIVE 07/08/2016 2146     )No results found for this or any previous visit (from the past 240 hour(s)).    Anti-infectives    None       Radiology Studies: No results found.      Scheduled Meds: . cyclophosphamide  150 mg Oral Daily  . darifenacin  7.5 mg Oral Daily  . metoprolol tartrate  25 mg Oral BID  . pantoprazole  40 mg Oral Daily  . predniSONE  30 mg Oral  Daily  . sertraline  100 mg Oral QHS   Continuous Infusions: . sodium chloride 100 mL/hr at 07/12/16 0745  . heparin 1,300 Units/hr (07/12/16 0110)     LOS: 2 days    Time spent: 25 min    Reyne Dumas, DO [facility] Pager [phone]  If 7PM-7AM, please contact night-coverage [email or web address] Password [facility] 07/12/2016, 8:54 AM

## 2016-07-12 NOTE — Progress Notes (Signed)
ANTICOAGULATION CONSULT NOTE - Follow Up Consult  Pharmacy Consult for Heparin (Xarelto on hold) Indication: DVT   Patient Measurements: Height:  (157.5 cm) Weight: 241 lb 6.4 oz (109.5 kg) IBW/kg (Calculated) : 50.1 Vital Signs:  Temp: 98 F (36.7 C) (04/27 2052) Temp Source: Oral (04/27 2052) BP: 100/58 (04/27 2052) Pulse Rate: 56 (04/27 2204)  Labs:  Recent Labs  07/09/16 0008 07/09/16 0244  07/09/16 0824 07/10/16 0354 07/10/16 1429 07/11/16 0444 07/11/16 1018 07/11/16 2238  HGB  --   --   < > 9.3* 8.6*  --  8.8*  --   --   HCT  --   --   --  26.8* 25.3*  --  25.9*  --   --   PLT  --   --   --  102* 124*  --  124*  --   --   APTT 20*  --   --   --   --  38* 101* 51* 168*  LABPROT 14.9  --   --   --   --   --   --   --   --   INR 1.16  --   --   --   --   --   --   --   --   HEPARINUNFRC  --   --   --   --   --  >2.20* 1.60*  --   --   CREATININE  --  1.34*  --   --  0.97  --  0.88  --   --   < > = values in this interval not displayed.  Estimated Creatinine Clearance: 78.3 mL/min (by C-G formula based on SCr of 0.88 mg/dL).   Assessment: On heparin for DVT, got some Xarelto 4/26 so using aPTT to dose for now given Xarelto influence on anti-Xa levels, aPTT is supra-therapeutic tonight, confirmed with RN that lab was drawn from opposite arm.   Goal of Therapy:  Heparin level 0.3-0.7 units/ml aPTT 66-102 seconds Monitor platelets by anticoagulation protocol: Yes   Plan:  -Hold heparin x 1 hr -Re-start heparin at 1300 units/hr at 0100 -0900 aPTT/HL  Cassandra [name] 07/12/2016,12:04 AM

## 2016-07-13 ENCOUNTER — Encounter (HOSPITAL_COMMUNITY): Payer: Self-pay | Admitting: Certified Registered Nurse Anesthetist

## 2016-07-13 ENCOUNTER — Inpatient Hospital Stay (HOSPITAL_COMMUNITY): Payer: Medicare HMO

## 2016-07-13 LAB — CBC
HCT: 24.6 % — ABNORMAL LOW (ref 36.0–46.0)
Hemoglobin: 8.5 g/dL — ABNORMAL LOW (ref 12.0–15.0)
MCH: 34.7 pg — ABNORMAL HIGH (ref 26.0–34.0)
MCHC: 34.6 g/dL (ref 30.0–36.0)
MCV: 100.4 fL — ABNORMAL HIGH (ref 78.0–100.0)
PLATELETS: 124 10*3/uL — AB (ref 150–400)
RBC: 2.45 MIL/uL — ABNORMAL LOW (ref 3.87–5.11)
RDW: 15.2 % (ref 11.5–15.5)
WBC: 2.5 10*3/uL — AB (ref 4.0–10.5)

## 2016-07-13 LAB — HEPARIN LEVEL (UNFRACTIONATED): HEPARIN UNFRACTIONATED: 0.68 [IU]/mL (ref 0.30–0.70)

## 2016-07-13 LAB — URINALYSIS, ROUTINE W REFLEX MICROSCOPIC
BILIRUBIN URINE: NEGATIVE
Bacteria, UA: NONE SEEN
GLUCOSE, UA: NEGATIVE mg/dL
KETONES UR: NEGATIVE mg/dL
Leukocytes, UA: NEGATIVE
Nitrite: NEGATIVE
PROTEIN: NEGATIVE mg/dL
RBC / HPF: NONE SEEN RBC/hpf (ref 0–5)
SPECIFIC GRAVITY, URINE: 1.002 — AB (ref 1.005–1.030)
Squamous Epithelial / LPF: NONE SEEN
pH: 6 (ref 5.0–8.0)

## 2016-07-13 LAB — APTT: APTT: 84 s — AB (ref 24–36)

## 2016-07-13 NOTE — Progress Notes (Signed)
ANTICOAGULATION CONSULT NOTE - Follow Up Consult  Pharmacy Consult for Heparin Indication: DVT  Patient Measurements: Height:  (157.5 cm) Weight: 241 lb 6.4 oz (109.5 kg) IBW/kg (Calculated) : 50.1 Heparin Dosing Weight: 78 kg  Vital Signs: Temp: 99.1 F (37.3 C) (04/29 0545) Temp Source: Oral (04/29 0545) BP: 118/50 (04/29 0545) Pulse Rate: 69 (04/29 0545)  Labs:  Recent Labs  07/11/16 0444  07/12/16 0338 07/12/16 0843 07/12/16 1810 07/13/16 0400  HGB 8.8*  --  8.8*  --   --  8.5*  HCT 25.9*  --  26.1*  --   --  24.6*  PLT 124*  --  134*  --   --  124*  APTT 101*  < >  --  127* 114* 84*  HEPARINUNFRC 1.60*  --   --  1.11*  --  0.68  CREATININE 0.88  --  0.87  --   --   --   < > = values in this interval not displayed.  Estimated Creatinine Clearance: 79.2 mL/min (by C-G formula based on SCr of 0.87 mg/dL).   Medications: Heparin @ 1050 units/hr  Assessment: 60 yof with right leg DVT. CTA negative for PE. Originally started on Lovenox and transitioned to Xarelto on 4/25. Received 2 doses of Xarelto, then changed to IV heparin on 4/26. For mechanical thrombectomy and possible lysis/stenting on 4/30 in [facility].  Heparin level and aPTT are both therapeutic and correlating at 0.68 and 84 seconds. Will use just heparin levels to monitor from now on. CBC stable. No bleeding.   Goal of Therapy:  Heparin level 0.3-0.7 units/ml aPTT 66-102 seconds Monitor platelets by anticoagulation protocol: Yes   Plan:  1) Continue heparin at 1050 units/hr 2) Daily heparin level, CBC 3) Follow up after procedure tomorrow  Fredrik Rigger, PharmD, BCPS 07/13/2016,10:47 AM

## 2016-07-13 NOTE — Progress Notes (Signed)
   VASCULAR SURGERY ASSESSMENT & PLAN:   Scheduled for Right LE venogram, possible lysis and intervention for Right LE DVT by Dr. Randie Heinz tomorrow.   CRT yesterday was 0.87.  All of her questions were answered this AM. She is ready to proceed.   Continue heparin.   SUBJECTIVE:   No complaints.   PHYSICAL EXAM:   Vitals:   07/12/16 0657 07/12/16 1500 07/12/16 2101 07/13/16 0545  BP: 128/61 90/75 (!) 111/54 (!) 118/50  Pulse: 66 69 62 69  Resp: Temp: 97.8 F (36.6 C) 97.7 F (36.5 C) 98 F (36.7 C) 99.1 F (37.3 C)  TempSrc: Oral Oral Oral Oral  SpO2: 97% 95% 98% 96%  Weight:      Height:       Right LE swollen.   LABS:   Lab Results  Component Value Date   WBC 2.5 (L) 07/13/2016   HGB 8.5 (L) 07/13/2016   HCT 24.6 (L) 07/13/2016   MCV 100.4 (H) 07/13/2016   PLT 124 (L) 07/13/2016   Lab Results  Component Value Date   CREATININE 0.87 07/12/2016   Lab Results  Component Value Date   INR 1.16 07/09/2016   DUPLEX: Findings consistent with acute deep vein thrombosis involving the   right iliac vein, common femoral vein, right femoral vein, right popliteal vein, and right posterior tibial veins.   PROBLEM LIST:    Principal Problem:   AKI (acute kidney injury) ([facility]) Active Problems:   GERD (gastroesophageal reflux disease)   Positive ANA (antinuclear antibody)   Right leg swelling   Dehydration   Anemia   Prolonged QT interval   DVT (deep venous thrombosis) ([facility])   CURRENT MEDS:   . cyclophosphamide  150 mg Oral Daily  . darifenacin  7.5 mg Oral Daily  . metoprolol tartrate  25 mg Oral BID  . pantoprazole  40 mg Oral Daily  . predniSONE  30 mg Oral Daily  . sertraline  100 mg Oral QHS    Cari Caraway Beeper: [phone] Office: [phone] 07/13/2016

## 2016-07-13 NOTE — Progress Notes (Signed)
PROGRESS NOTE    Cassandra [name]  VOJ:[id number] DOB: [birth year] DOA: 07/08/2016 PCP: No PCP Per Patient   Outpatient Specialists:    Brief Narrative:  Cassandra [name] is a 61 y.o. 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  . The only other change is noted was Dr. Milinda Antis of rheumatology was decreasing the amount of prednisone monthly. This month she decreased from 40 mg prednisone daily to 30.She presented with a one-day history of right lower extremity DVT .   Assessment & Plan:   Principal Problem:   AKI (acute kidney injury) ([place]) Active Problems:   GERD (gastroesophageal reflux disease)   Positive ANA (antinuclear antibody)   Right leg swelling   Dehydration   Anemia   Prolonged QT interval   DVT (deep venous thrombosis) ([facility])   DVT of RLE -extensive swelling on right leg Vascular surgery consulted-plan is to proceed with pharmacal mechanical thrombectomy  with possible lysis possible stenting femoral-popliteal approach of her right lower extremity Continue heparin gtt , appreciate Dr. Donzetta Matters recommendations Procedure likely Monday afternoon  Acute kidney injury on chronic kidney disease stage III: Resolved -Urinalysis shows high specific gravity to suggest dehydration.  - IVF NS at 100 ml/hr Creatinine down to 0.87 She should also remain on IV fluids for hydration over the weekend   Hypokalemia Repleted  Essential hypertension - Hold lisinopril and furosemide 2/2 AKI  Prolonged QTC: Patient's initial QTC is noted to be 605 - Avoid further QT prolonging medications. 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- lower than baseline  Continue prednisone, may need stress dose steroids prior to procedure 4/30   Anemia: Hemoglobin 10.9 on  admission, now 8.8 Will need to be transfuse for hemoglobin less than 8.0 Check FOBT  Thrombocytopenia:   Improving, now 134 k >124k  No previous history of low platelets counts previously noted in the past. Previously her platelet count has been between 175-243    Cassandra [name] - protonix  Sinus tachycardia/SVT on 4/26-- resolved with vagal maneuver (blowing in straw)-- added back PRN BB if BP can tolerate, blood pressure soft Klonopin for anxiety   DVT prophylaxis: Heparin drip     Code Status: Full Code   Family Communication: by bedside    Disposition Plan: Vascular procedure  4/30   Consultants:   vascular     Subjective: Anxious about procedure on Monday   Objective: Vitals:   07/12/16 0657 07/12/16 1500 07/12/16 2101 07/13/16 0545  BP: 128/61 90/75 (!) 111/54 (!) 118/50  Pulse: 66 69 62 69  Resp: 18 19 18 18   Temp: 97.8 F (36.6 C) 97.7 F (36.5 C) 98 F (36.7 C) 99.1 F (37.3 C)  TempSrc: Oral Oral Oral Oral  SpO2: 97% 95% 98% 96%  Weight:      Height:        Intake/Output Summary (Last 24 hours) at 07/13/16 0923 Last data filed at 07/13/16 0656  Gross per 24 hour  Intake          1928.98 ml  Output              700 ml  Net          1228.98 ml   Filed Weights   07/08/16 1847 07/09/16 0140 07/10/16 0431  Weight: 107.5  kg (237 lb) 106.3 kg (234 lb 6.4 oz) 109.5 kg (241 lb 6.4 oz)    Examination:  General exam: in bed, eating breakfast Respiratory system: Clear to auscultation. Respiratory effort normal. Cardiovascular system: S1 & S2 heard, RRR. No JVD, murmurs, rubs, gallops or clicks.. Gastrointestinal system: Abdomen is nondistended, soft and nontender. No organomegaly or masses felt. Normal bowel sounds heard. Central nervous system: Alert and oriented. No focal neurological deficits. Right leg swollen, pulses palpable, + edema but warmth same as other leg    Data Reviewed: I have personally reviewed following labs and imaging  studies  CBC:  Recent Labs Lab 07/09/16 0824 07/10/16 0354 07/11/16 0444 07/12/16 0338 07/13/16 0400  WBC 4.8 3.1* 3.6* 3.7* 2.5*  HGB 9.3* 8.6* 8.8* 8.8* 8.5*  HCT 26.8* 25.3* 25.9* 26.1* 24.6*  MCV 100.4* 100.0 100.0 100.4* 100.4*  PLT 102* 124* 124* 134* 882*   Basic Metabolic Panel:  Recent Labs Lab 07/08/16 1920 07/09/16 0244 07/10/16 0354 07/11/16 0444 07/12/16 0338  NA 137 136 137 139 138  K 3.8 3.2* 4.7 3.9 3.9  CL 102 104 107 108 109  CO2 21* 21* 23 23 23   GLUCOSE 154* 111* 139* 120* 121*  BUN 21* 16 12 13 13   CREATININE 1.60* 1.34* 0.97 0.88 0.87  CALCIUM 9.1 8.3* 8.5* 8.5* 8.5*  MG  --  2.1  --   --   --    GFR: Estimated Creatinine Clearance: 79.2 mL/min (by C-G formula based on SCr of 0.87 mg/dL). Liver Function Tests:  Recent Labs Lab 07/12/16 0338  AST 36  ALT 33  ALKPHOS 56  BILITOT 0.7  PROT 5.3*  ALBUMIN 2.9*   No results for input(s): LIPASE, AMYLASE in the last 168 hours. No results for input(s): AMMONIA in the last 168 hours. Coagulation Profile:  Recent Labs Lab 07/09/16 0008  INR 1.16   Cardiac Enzymes: No results for input(s): CKTOTAL, CKMB, CKMBINDEX, TROPONINI in the last 168 hours. BNP (last 3 results) No results for input(s): PROBNP in the last 8760 hours. HbA1C: No results for input(s): HGBA1C in the last 72 hours. CBG: No results for input(s): GLUCAP in the last 168 hours. Lipid Profile: No results for input(s): CHOL, HDL, LDLCALC, TRIG, CHOLHDL, LDLDIRECT in the last 72 hours. Thyroid Function Tests: No results for input(s): TSH, T4TOTAL, FREET4, T3FREE, THYROIDAB in the last 72 hours. Anemia Panel: No results for input(s): VITAMINB12, FOLATE, FERRITIN, TIBC, IRON, RETICCTPCT in the last 72 hours. Urine analysis:    Component Value Date/Time   COLORURINE YELLOW 07/08/2016 2146   APPEARANCEUR HAZY (A) 07/08/2016 2146   LABSPEC 1.033 (H) 07/08/2016 2146   PHURINE 5.0 07/08/2016 2146   GLUCOSEU NEGATIVE  07/08/2016 2146   GLUCOSEU NEGATIVE 11/30/2008 1522   HGBUR MODERATE (A) 07/08/2016 2146   BILIRUBINUR NEGATIVE 07/08/2016 2146   [place] 07/08/2016 2146   PROTEINUR 30 (A) 07/08/2016 2146   UROBILINOGEN 1 06/02/2013 1524   NITRITE NEGATIVE 07/08/2016 2146   LEUKOCYTESUR NEGATIVE 07/08/2016 2146     )No results found for this or any previous visit (from the past 240 hour(s)).    Anti-infectives    None       Radiology Studies: No results found.      Scheduled Meds: . cyclophosphamide  150 mg Oral Daily  . darifenacin  7.5 mg Oral Daily  . metoprolol tartrate  25 mg Oral BID  . pantoprazole  40 mg Oral Daily  . predniSONE  30 mg Oral  Daily  . sertraline  100 mg Oral QHS   Continuous Infusions: . sodium chloride 100 mL/hr at 07/13/16 0555  . heparin 1,050 Units/hr (07/12/16 2016)     LOS: 3 days    Time spent: 25 min    Reyne Dumas, DO [facility] Pager [phone]  If 7PM-7AM, please contact night-coverage [email or web address] Password [facility] 07/13/2016, 9:23 AM

## 2016-07-14 ENCOUNTER — Encounter (HOSPITAL_COMMUNITY)
Admission: EM | Disposition: A | Discharge status: Home Health | Payer: Self-pay | Source: Home / Self Care | Attending: Internal Medicine

## 2016-07-14 DIAGNOSIS — I82491 Acute embolism and thrombosis of other specified deep vein of right lower extremity: Secondary | ICD-10-CM

## 2016-07-14 HISTORY — PX: LOWER EXTREMITY VENOGRAPHY: CATH118253

## 2016-07-14 LAB — CBC
HEMATOCRIT: 24.3 % — AB (ref 36.0–46.0)
HEMOGLOBIN: 8.2 g/dL — AB (ref 12.0–15.0)
MCH: 34.2 pg — AB (ref 26.0–34.0)
MCHC: 33.7 g/dL (ref 30.0–36.0)
MCV: 101.3 fL — AB (ref 78.0–100.0)
Platelets: 135 10*3/uL — ABNORMAL LOW (ref 150–400)
RBC: 2.4 MIL/uL — ABNORMAL LOW (ref 3.87–5.11)
RDW: 15 % (ref 11.5–15.5)
WBC: 2.4 10*3/uL — ABNORMAL LOW (ref 4.0–10.5)

## 2016-07-14 LAB — COMPREHENSIVE METABOLIC PANEL
ALT: 30 U/L (ref 14–54)
AST: 24 U/L (ref 15–41)
Albumin: 2.7 g/dL — ABNORMAL LOW (ref 3.5–5.0)
Alkaline Phosphatase: 49 U/L (ref 38–126)
Anion gap: 7 (ref 5–15)
BUN: 9 mg/dL (ref 6–20)
CHLORIDE: 110 mmol/L (ref 101–111)
CO2: 22 mmol/L (ref 22–32)
CREATININE: 0.94 mg/dL (ref 0.44–1.00)
Calcium: 8.5 mg/dL — ABNORMAL LOW (ref 8.9–10.3)
GFR calc Af Amer: >60 mL/min (ref >60)
Glucose, Bld: 109 mg/dL — ABNORMAL HIGH (ref 65–99)
Potassium: 3.8 mmol/L (ref 3.5–5.1)
Sodium: 139 mmol/L (ref 135–145)
Total Bilirubin: 0.4 mg/dL (ref 0.3–1.2)
Total Protein: 4.9 g/dL — ABNORMAL LOW (ref 6.5–8.1)

## 2016-07-14 LAB — HEPARIN LEVEL (UNFRACTIONATED): Heparin Unfractionated: 0.5 IU/mL (ref 0.30–0.70)

## 2016-07-14 SURGERY — LOWER EXTREMITY VENOGRAPHY
Anesthesia: LOCAL | Laterality: Right

## 2016-07-14 SURGERY — VENOGRAM
Anesthesia: Choice | Laterality: Right

## 2016-07-14 MED ORDER — IODIXANOL 320 MG/ML IV SOLN
INTRAVENOUS | Status: DC | PRN
  Administered 2016-07-14: 45 mL via INTRAVENOUS

## 2016-07-14 MED ORDER — LIDOCAINE HCL 1 % IJ SOLN
INTRAMUSCULAR | Status: AC
  Filled 2016-07-14: qty 20

## 2016-07-14 MED ORDER — HEPARIN (PORCINE) IN NACL 2-0.9 UNIT/ML-% IJ SOLN
INTRAMUSCULAR | Status: AC
  Filled 2016-07-14: qty 1000

## 2016-07-14 MED ORDER — MIDAZOLAM HCL 2 MG/2ML IJ SOLN
INTRAMUSCULAR | Status: DC | PRN
Start: 2016-07-14 — End: 2016-07-14
  Administered 2016-07-14: 2 mg via INTRAVENOUS

## 2016-07-14 MED ORDER — SODIUM CHLORIDE 0.9 % IV SOLN
1.0000 mg/h | INTRAVENOUS | Status: DC
  Filled 2016-07-14 (×2): qty 10

## 2016-07-14 MED ORDER — LIDOCAINE HCL (PF) 1 % IJ SOLN
INTRAMUSCULAR | Status: DC | PRN
Start: 2016-07-14 — End: 2016-07-14
  Administered 2016-07-14: 45 mL

## 2016-07-14 MED ORDER — FENTANYL CITRATE (PF) 100 MCG/2ML IJ SOLN
INTRAMUSCULAR | Status: DC | PRN
  Administered 2016-07-14: 25 ug via INTRAVENOUS
  Administered 2016-07-14: 50 ug via INTRAVENOUS

## 2016-07-14 MED ORDER — FENTANYL CITRATE (PF) 100 MCG/2ML IJ SOLN
INTRAMUSCULAR | Status: AC
  Filled 2016-07-14: qty 2

## 2016-07-14 MED ORDER — MIDAZOLAM HCL 2 MG/2ML IJ SOLN
INTRAMUSCULAR | Status: AC
  Filled 2016-07-14: qty 2

## 2016-07-14 MED ORDER — CEFAZOLIN SODIUM-DEXTROSE 2-4 GM/100ML-% IV SOLN: 2.0000 g | INTRAVENOUS | Status: DC

## 2016-07-14 MED ORDER — HEPARIN (PORCINE) IN NACL 100-0.45 UNIT/ML-% IJ SOLN
1200.0000 [IU]/h | INTRAMUSCULAR | Status: AC
  Administered 2016-07-15 – 2016-07-17 (×3): 1050 [IU]/h via INTRAVENOUS
  Filled 2016-07-14 (×3): qty 250

## 2016-07-14 SURGICAL SUPPLY — 14 items
CATH ANGIO 5F BER2 100CM (CATHETERS) ×2 IMPLANT
COVER PRB 48X5XTLSCP FOLD TPE (BAG) ×1 IMPLANT
COVER PROBE 5X48 (BAG) ×1
DEVICE TORQUE .025-.038 (MISCELLANEOUS) ×2 IMPLANT
GUIDEWIRE ANGLED .035X260CM (WIRE) ×2 IMPLANT
KIT MICROINTRODUCER STIFF 5F (SHEATH) ×2 IMPLANT
KIT PV (KITS) ×2 IMPLANT
SHEATH PINNACLE 5F 10CM (SHEATH) ×2 IMPLANT
SYR MEDRAD MARK V 150ML (SYRINGE) IMPLANT
TRANSDUCER W/STOPCOCK (MISCELLANEOUS) ×2 IMPLANT
TRAY PV CATH (CUSTOM PROCEDURE TRAY) ×2 IMPLANT
WIRE BENTSON .035X145CM (WIRE) ×6 IMPLANT
WIRE J 3MM .035X145CM (WIRE) ×2 IMPLANT
WIRE TORQFLEX AUST .018X40CM (WIRE) ×6 IMPLANT

## 2016-07-14 NOTE — Care Management Important Message (Signed)
Important Message  Patient Details  Name: Cassandra [name] MRN: [id number] Date of Birth: Oct 03, 1955   Medicare Important Message Given:  Yes    Kyla Balzarine 07/14/2016, 11:27 PM

## 2016-07-14 NOTE — Progress Notes (Signed)
PROGRESS NOTE    [name]  JWJ:[id number] DOB: 08-11-1955 DOA: 07/08/2016 PCP: No PCP Per Patient   Outpatient Specialists:    Brief Narrative:  Cassandra [name] is a 61 y.o. 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  . The only other change is noted was Dr. Milinda Antis of rheumatology was decreasing the amount of prednisone monthly. This month she decreased from 40 mg prednisone daily to 30.She presented with a one-day history of right lower extremity DVT .   Assessment & Plan:   Principal Problem:   AKI (acute kidney injury) ([place]) Active Problems:   GERD (gastroesophageal reflux disease)   Positive ANA (antinuclear antibody)   Right leg swelling   Dehydration   Anemia   Prolonged QT interval   DVT (deep venous thrombosis) (H[facility])  Fever Low-grade fever yesterday, none today, chest x-ray, UA negative  DVT of RLE extensive swelling on right leg Vascular surgery consulted-plan is to proceed with pharmacal mechanical thrombectomy  with possible lysis possible stenting femoral-popliteal approach of her right lower extremity 4/30 in PM  Continue heparin gtt , appreciate Dr. Donzetta Matters recommendations    Acute kidney injury on chronic kidney disease stage III: Resolved -Urinalysis shows high specific gravity to suggest dehydration.   IVF NS at 100 ml/hr Creatinine down to 0.94     Hypokalemia Repleted  Essential hypertension - Hold lisinopril and furosemide 2/2 AKI  Prolonged QTC: Patient's initial QTC is noted to be 605 - Avoid further QT prolonging medications. 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- lower than baseline  Continue prednisone, may need stress dose steroids prior to procedure 4/30   Anemia: Hemoglobin 10.9 on admission, now  8.8>8.2, follow post op  Will need to be transfuse for hemoglobin less than 8.0 Check FOBT  Thrombocytopenia:   Improving, now 134 k >124k>135 K   No previous history of low platelets counts previously noted in the past. Previously her platelet count has been between 175-243    [name] - protonix  Sinus tachycardia/SVT on 4/26-- resolved with vagal maneuver (blowing in straw)-- added back PRN BB if BP can tolerate, blood pressure soft Klonopin for anxiety   DVT prophylaxis: Heparin drip     Code Status: Full Code   Family Communication:  patient appears to understand plan of care   Disposition Plan: Vascular procedure  4/30   Consultants:   vascular     Subjective: Afebrile overnight  Objective: Vitals:   07/13/16 1200 07/13/16 1450 07/13/16 2012 07/14/16 0629  BP:  (!) 100/59 (!) 107/55 (!) 130/56  Pulse:  61 (!) 52 70  Resp:  17 16 20   Temp: 97.4 F (36.3 C) 98.2 F (36.8 C) 97.5 F (36.4 C) 98.4 F (36.9 C)  TempSrc: Oral Oral Oral Oral  SpO2:  96% 98% 99%  Weight:      Height:        Intake/Output Summary (Last 24 hours) at 07/14/16 0931 Last data filed at 07/14/16 0506  Gross per 24 hour  Intake          2809.43 ml  Output              500 ml  Net          2309.43 ml   [employer]  07/08/16 1847 07/09/16 0140 07/10/16 0431  Weight: 107.5 kg (237 lb) 106.3 kg (234 lb 6.4 oz) 109.5 kg (241 lb 6.4 oz)    Examination:  General exam: in bed, eating breakfast Respiratory system: Clear to auscultation. Respiratory effort normal. Cardiovascular system: S1 & S2 heard, RRR. No JVD, murmurs, rubs, gallops or clicks.. Gastrointestinal system: Abdomen is nondistended, soft and nontender. No organomegaly or masses felt. Normal bowel sounds heard. Central nervous system: Alert and oriented. No focal neurological deficits. Right leg swollen, pulses palpable, + edema but warmth same as other leg    Data Reviewed: I have personally reviewed  following labs and imaging studies  CBC:  Recent Labs Lab 07/10/16 0354 07/11/16 0444 07/12/16 0338 07/13/16 0400 07/14/16 0228  WBC 3.1* 3.6* 3.7* 2.5* 2.4*  HGB 8.6* 8.8* 8.8* 8.5* 8.2*  HCT 25.3* 25.9* 26.1* 24.6* 24.3*  MCV 100.0 100.0 100.4* 100.4* 101.3*  PLT 124* 124* 134* 124* 409*   Basic Metabolic Panel:  Recent Labs Lab 07/09/16 0244 07/10/16 0354 07/11/16 0444 07/12/16 0338 07/14/16 0228  NA 136 137 139 138 139  K 3.2* 4.7 3.9 3.9 3.8  CL 104 107 108 109 110  CO2 21* 23 23 23 22   GLUCOSE 111* 139* 120* 121* 109*  BUN 16 12 13 13 9   CREATININE 1.34* 0.97 0.88 0.87 0.94  CALCIUM 8.3* 8.5* 8.5* 8.5* 8.5*  MG 2.1  --   --   --   --    GFR: Estimated Creatinine Clearance: 73.3 mL/min (by C-G formula based on SCr of 0.94 mg/dL). Liver Function Tests:  Recent Labs Lab 07/12/16 0338 07/14/16 0228  AST 36 24  ALT 33 30  ALKPHOS 56 49  BILITOT 0.7 0.4  PROT 5.3* 4.9*  ALBUMIN 2.9* 2.7*   No results for input(s): LIPASE, AMYLASE in the last 168 hours. No results for input(s): AMMONIA in the last 168 hours. Coagulation Profile:  Recent Labs Lab 07/09/16 0008  INR 1.16   Cardiac Enzymes: No results for input(s): CKTOTAL, CKMB, CKMBINDEX, TROPONINI in the last 168 hours. BNP (last 3 results) No results for input(s): PROBNP in the last 8760 hours. HbA1C: No results for input(s): HGBA1C in the last 72 hours. CBG: No results for input(s): GLUCAP in the last 168 hours. Lipid Profile: No results for input(s): CHOL, HDL, LDLCALC, TRIG, CHOLHDL, LDLDIRECT in the last 72 hours. Thyroid Function Tests: No results for input(s): TSH, T4TOTAL, FREET4, T3FREE, THYROIDAB in the last 72 hours. Anemia Panel: No results for input(s): VITAMINB12, FOLATE, FERRITIN, TIBC, IRON, RETICCTPCT in the last 72 hours. Urine analysis:    Component Value Date/Time   COLORURINE COLORLESS (A) 07/13/2016 1651   APPEARANCEUR CLEAR 07/13/2016 1651   LABSPEC 1.002 (L)  07/13/2016 1651   PHURINE 6.0 07/13/2016 1651   GLUCOSEU NEGATIVE 07/13/2016 1651   GLUCOSEU NEGATIVE 11/30/2008 1522   HGBUR SMALL (A) 07/13/2016 1651   BILIRUBINUR NEGATIVE 07/13/2016 1651   KETONESUR NEGATIVE 07/13/2016 1651   PROTEINUR NEGATIVE 07/13/2016 1651   UROBILINOGEN 1 06/02/2013 1524   NITRITE NEGATIVE 07/13/2016 1651   LEUKOCYTESUR NEGATIVE 07/13/2016 1651     )No results found for this or any previous visit (from the past 240 hour(s)).    Anti-infectives    Start     Dose/Rate Route Frequency Ordered Stop   07/14/16 0900  ceFAZolin (ANCEF) IVPB 2g/100 mL premix    Comments:  Send with pt to OR I am aware of her Ampicillin allergy thanks   2  g 200 mL/hr over 30 Minutes Intravenous To Short Stay 07/14/16 0528 07/15/16 0900       Radiology Studies: Dg Chest 2 View  Result Date: 07/13/2016 CLINICAL DATA:  Fever starting today. Hx of heart murmur, hypertension, supraventricular tachycardia and wegner's disease. Pt c/o off and on cough that started the other day. EXAM: CHEST  2 VIEW COMPARISON:  03/16/2016 FINDINGS: Cardiac silhouette is mildly enlarged. No mediastinal or hilar masses. No evidence of adenopathy. Clear lungs.  No pleural effusion or pneumothorax. Skeletal structures are demineralized but intact. IMPRESSION: No acute cardiopulmonary disease. Electronically Signed   By: Lajean Manes M.D.   On: 07/13/2016 11:45        Scheduled Meds: . cyclophosphamide  150 mg Oral Daily  . darifenacin  7.5 mg Oral Daily  . metoprolol tartrate  25 mg Oral BID  . pantoprazole  40 mg Oral Daily  . predniSONE  30 mg Oral Daily  . sertraline  100 mg Oral QHS   Continuous Infusions: . sodium chloride 100 mL/hr at 07/13/16 0555  .  ceFAZolin (ANCEF) IV    . heparin 1,050 Units/hr (07/12/16 2016)     LOS: 4 days    Time spent: 25 min    Reyne Dumas, DO [facility] Pager [phone]  If 7PM-7AM, please contact  night-coverage [email or web address] Password [facility] 07/14/2016, 9:31 AM

## 2016-07-14 NOTE — Op Note (Signed)
    Patient name: Cassandra [name] MRN: [id number] DOB: 11-04-1955 Sex: female  07/14/2016 Pre-operative Diagnosis: extensive right lower extremity dvt Post-operative diagnosis:  Same Surgeon:  Luanna Salk. Randie Heinz, MD Procedure Performed:  1. US guided access right popliteal vein 2. Moderate sedation for 51 minutes with fentanyl and versed  Indications:  61 year old female with a history of extensive right lower extremity DVT. We have discussed the risk and benefits of focal mechanical lysis with possible stenting and she agrees to proceed.  Findings: Patient had a nerve around the popliteal vein that made access very difficult. We were able to access the popliteal vein on a few separate occasions but could not get wires pass or demonstrate intraluminal access on venography. We will ultimately aborted the procedure pressure was held for a period of 5 minutes and at completion she had palpable pedal pulses.   Procedure:  The patient was identified in the holding area and taken to room 8.  The patient was then placed supine on the table and prepped and draped in the usual sterile fashion.  A time out was called.  Ultrasound was used to evaluate the popliteal vein which was noted to be large and noncompressible. We instilled lidocaine 1% without epinephrine overlying the vein and attempts to cannulated first with 18-gauge needle this caused significant pain shooting down her leg consistent with nerve pain. We then attempted to cannulate with micropuncture needle and were able to get access but a wire would not pass. Patient was given further lidocaine as well as Versed and fentanyl. This time we were able to get cannulation of the popliteal vein further caudal on the leg. We ultimately got a 5 French sheath in but venography did not demonstrate any flow. Ultimately we elected to terminate the procedure and all pressure for a total of 5 minutes. Will discuss with patient possible repeat intervention in the very  near future.  Contrast: 45cc   Cassandra Kretschmer C. Randie Heinz, MD [facility] [place] Office: [phone] Pager: [phone]

## 2016-07-14 NOTE — Progress Notes (Signed)
ANTICOAGULATION CONSULT NOTE - Follow Up Consult  Pharmacy Consult for Heparin Indication: RLE DVT  Patient Measurements: Height:  (157.5 cm) Weight: 241 lb 6.4 oz (109.5 kg) IBW/kg (Calculated) : 50.1 Heparin Dosing Weight: 78 kg  Vital Signs: Temp: 100.3 F (37.9 C) (04/30 1956) Temp Source: Oral (04/30 1956) BP: 83/68 (04/30 1956) Pulse Rate: 87 (04/30 1956)  Labs:  Recent Labs  07/12/16 0338 07/12/16 0843 07/12/16 1810 07/13/16 0400 07/14/16 0228  HGB 8.8*  --   --  8.5* 8.2*  HCT 26.1*  --   --  24.6* 24.3*  PLT 134*  --   --  124* 135*  APTT  --  127* 114* 84*  --   HEPARINUNFRC  --  1.11*  --  0.68 0.50  CREATININE 0.87  --   --   --  0.94    Estimated Creatinine Clearance: 73.3 mL/min (by C-G formula based on SCr of 0.94 mg/dL).   Medications: Heparin @ 1050 units/hr  Assessment: 61 yo F with right leg DVT. CTA negative for PE. Originally started on Lovenox and transitioned to Xarelto on 4/25. Received 2 doses of Xarelto, then changed to IV heparin on 4/26 with plans for mechanical thrombectomy and possible lysis/stenting in [facility] Lab on 4/30.  This procedure was unsuccessful.  Pharmacy was asked to restart heparin at 2100 4/30.  Pt was previously therapeutic on 1050 units/hr.   Goal of Therapy:  Heparin level 0.3-0.7 Monitor platelets by anticoagulation protocol: Yes   Plan:  1) Restart heparin at 1050 units/hr (at 9pm tonight) 2) Daily heparin level, CBC 3) Follow up plans for oral anticoagulation  Sherryann Frese, Pharm.D., BCPS Clinical Pharmacist Pager: [phone] Clinical phone for 07/14/2016 from 3:30-11:00 is [phone]. After 11pm, please call Main [facility] [phone]) for assistance. 07/14/2016 8:37 PM

## 2016-07-14 NOTE — Progress Notes (Signed)
  Progress Note    07/14/2016 7:53 AM * No surgery date entered *  Subjective: no acute issues  Vitals:   07/13/16 2012 07/14/16 0629  BP: (!) 107/55 (!) 130/56  Pulse: (!) 52 70  Resp: 16 20  Temp: 97.5 F (36.4 C) 98.4 F (36.9 C)    Physical Exam: aaox3 Non labored respirations Abdomen is soft Right leg with 2+ non-pitting edema  CBC    Component Value Date/Time   WBC 2.4 (L) 07/14/2016 0228   RBC 2.40 (L) 07/14/2016 0228   HGB 8.2 (L) 07/14/2016 0228   HCT 24.3 (L) 07/14/2016 0228   PLT 135 (L) 07/14/2016 0228   MCV 101.3 (H) 07/14/2016 0228   MCH 34.2 (H) 07/14/2016 0228   MCHC 33.7 07/14/2016 0228   RDW 15.0 07/14/2016 0228   LYMPHSABS 1.9 03/13/2016 1837   MONOABS 0.8 03/13/2016 1837   EOSABS 0.1 03/13/2016 1837   BASOSABS 0.1 03/13/2016 1837    BMET    Component Value Date/Time   NA 139 07/14/2016 0228   K 3.8 07/14/2016 0228   CL 110 07/14/2016 0228   CO2 22 07/14/2016 0228   GLUCOSE 109 (H) 07/14/2016 0228   BUN 9 07/14/2016 0228   CREATININE 0.94 07/14/2016 0228   CREATININE 0.76 06/02/2013 1510   CALCIUM 8.5 (L) 07/14/2016 0228   GFRNONAA >60 07/14/2016 0228   GFRNONAA 87 06/02/2013 1510   GFRAA >60 07/14/2016 0228   GFRAA >89 06/02/2013 1510    INR    Component Value Date/Time   INR 1.16 07/09/2016 0008     Intake/Output Summary (Last 24 hours) at 07/14/16 0753 Last data filed at 07/14/16 0506  Gross per 24 hour  Intake          3169.43 ml  Output             1500 ml  Net          1669.43 ml     Assessment:  61 y.o. female is here with extensive right lower extremity dvt  Plan: Plan for rle lysis, possible pharmacomechanical thrombectomy with stenting. Discussed risks and benefits and will plan to proceed today. Keep npo for p.m procedure.   Eternity Dexter C. Randie Heinz, MD [facility] [place] Office: [phone] Pager: [phone]  07/14/2016 7:53 AM

## 2016-07-14 NOTE — Progress Notes (Signed)
ANTICOAGULATION CONSULT NOTE - Follow Up Consult  Pharmacy Consult for Heparin Indication: DVT  Patient Measurements: Height:  (157.5 cm) Weight: 241 lb 6.4 oz (109.5 kg) IBW/kg (Calculated) : 50.1 Heparin Dosing Weight: 78 kg  Vital Signs: Temp: 98.4 F (36.9 C) (04/30 0629) Temp Source: Oral (04/30 0629) BP: 130/56 (04/30 0629) Pulse Rate: 70 (04/30 0629)  Labs:  Recent Labs  07/12/16 0338 07/12/16 0843 07/12/16 1810 07/13/16 0400 07/14/16 0228  HGB 8.8*  --   --  8.5* 8.2*  HCT 26.1*  --   --  24.6* 24.3*  PLT 134*  --   --  124* 135*  APTT  --  127* 114* 84*  --   HEPARINUNFRC  --  1.11*  --  0.68 0.50  CREATININE 0.87  --   --   --  0.94    Estimated Creatinine Clearance: 73.3 mL/min (by C-G formula based on SCr of 0.94 mg/dL).   Medications: Heparin @ 1050 units/hr  Assessment: 60 yof with right leg DVT. CTA negative for PE. Originally started on Lovenox and transitioned to Xarelto on 4/25. Received 2 doses of Xarelto, then changed to IV heparin on 4/26. For mechanical thrombectomy and possible lysis/stenting this afternoon in PV Lab.  Heparin level is therapeutic at 0.5 on 1050 units/hr. CBC stable. No bleeding.   Goal of Therapy:  Heparin level 0.3-0.7 units/ml aPTT 66-102 seconds Monitor platelets by anticoagulation protocol: Yes   Plan:  1) Continue heparin at 1050 units/hr 2) Daily heparin level, CBC 3) Follow up after procedure  Riki Rusk, PharmD, BCPS 07/14/2016,7:52 AM

## 2016-07-15 ENCOUNTER — Encounter (HOSPITAL_COMMUNITY): Payer: Self-pay | Admitting: Vascular Surgery

## 2016-07-15 LAB — BASIC METABOLIC PANEL
ANION GAP: 5 (ref 5–15)
BUN: 8 mg/dL (ref 6–20)
CALCIUM: 8.2 mg/dL — AB (ref 8.9–10.3)
CO2: 25 mmol/L (ref 22–32)
CREATININE: 0.94 mg/dL (ref 0.44–1.00)
Chloride: 108 mmol/L (ref 101–111)
GFR calc Af Amer: >60 mL/min (ref >60)
GFR calc non Af Amer: >60 mL/min (ref >60)
GLUCOSE: 123 mg/dL — AB (ref 65–99)
Potassium: 3.2 mmol/L — ABNORMAL LOW (ref 3.5–5.1)
Sodium: 138 mmol/L (ref 135–145)

## 2016-07-15 LAB — CBC
HCT: 23.5 % — ABNORMAL LOW (ref 36.0–46.0)
HEMOGLOBIN: 7.8 g/dL — AB (ref 12.0–15.0)
MCH: 33.9 pg (ref 26.0–34.0)
MCHC: 33.2 g/dL (ref 30.0–36.0)
MCV: 102.2 fL — ABNORMAL HIGH (ref 78.0–100.0)
Platelets: 130 10*3/uL — ABNORMAL LOW (ref 150–400)
RBC: 2.3 MIL/uL — ABNORMAL LOW (ref 3.87–5.11)
RDW: 15.4 % (ref 11.5–15.5)
WBC: 1.5 10*3/uL — AB (ref 4.0–10.5)

## 2016-07-15 LAB — MAGNESIUM: MAGNESIUM: 1.6 mg/dL — AB (ref 1.7–2.4)

## 2016-07-15 LAB — HEPARIN LEVEL (UNFRACTIONATED)
Heparin Unfractionated: 0.22 IU/mL — ABNORMAL LOW (ref 0.30–0.70)
Heparin Unfractionated: 0.34 IU/mL (ref 0.30–0.70)

## 2016-07-15 MED ORDER — SODIUM CHLORIDE 0.9 % IV SOLN
INTRAVENOUS | Status: DC
  Administered 2016-07-15: via INTRAVENOUS
  Administered 2016-07-15: 75 mL/h via INTRAVENOUS

## 2016-07-15 MED ORDER — POTASSIUM CHLORIDE CRYS ER 20 MEQ PO TBCR
40.0000 meq | EXTENDED_RELEASE_TABLET | Freq: Two times a day (BID) | ORAL | Status: AC
  Administered 2016-07-15 – 2016-07-16 (×3): 40 meq via ORAL
  Filled 2016-07-15 (×3): qty 2

## 2016-07-15 MED ORDER — SODIUM CHLORIDE 0.9 % IV SOLN: Freq: Once | INTRAVENOUS | Status: DC

## 2016-07-15 MED FILL — Heparin Sodium (Porcine) 2 Unit/ML in Sodium Chloride 0.9%: INTRAMUSCULAR | Qty: 1000 | Status: AC

## 2016-07-15 NOTE — Progress Notes (Signed)
  Progress Note    07/15/2016 9:52 AM 1 Day Post-Op  Subjective:  Right leg remains edematous  Vitals:   07/14/16 2218 07/15/16 0611  BP: (!) 94/49 (!) 141/59  Pulse: 97 100  Resp:  18  Temp:  100.3 F (37.9 C)    Physical Exam: aaox3 Non labored respirations Abdomen is soft Right leg with non pitting edema, tight Palpable dp on right  CBC    Component Value Date/Time   WBC 1.5 (L) 07/15/2016 0224   RBC 2.30 (L) 07/15/2016 0224   HGB 7.8 (L) 07/15/2016 0224   HCT 23.5 (L) 07/15/2016 0224   PLT 130 (L) 07/15/2016 0224   MCV 102.2 (H) 07/15/2016 0224   MCH 33.9 07/15/2016 0224   MCHC 33.2 07/15/2016 0224   RDW 15.4 07/15/2016 0224   LYMPHSABS 1.9 03/13/2016 1837   MONOABS 0.8 03/13/2016 1837   EOSABS 0.1 03/13/2016 1837   BASOSABS 0.1 03/13/2016 1837    BMET    Component Value Date/Time   NA 138 07/15/2016 0224   K 3.2 (L) 07/15/2016 0224   CL 108 07/15/2016 0224   CO2 25 07/15/2016 0224   GLUCOSE 123 (H) 07/15/2016 0224   BUN 8 07/15/2016 0224   CREATININE 0.94 07/15/2016 0224   CREATININE 0.76 06/02/2013 1510   CALCIUM 8.2 (L) 07/15/2016 0224   GFRNONAA >60 07/15/2016 0224   GFRNONAA 87 06/02/2013 1510   GFRAA >60 07/15/2016 0224   GFRAA >89 06/02/2013 1510    INR    Component Value Date/Time   INR 1.16 07/09/2016 0008     Intake/Output Summary (Last 24 hours) at 07/15/16 0952 Last data filed at 07/15/16 1610  Gross per 24 hour  Intake            85.93 ml  Output              625 ml  Net          -539.07 ml     Assessment:  61 y.o. female is here with extensive rle dvt, failed endovascular intervention yesterday 2/2 anatomic factors and patient discomfort  Plan: I discussed continued anticoagulation vs repeat attempt at endovascular intervention that could be performed either this week or within the next 2 weeks at least. Given her level of discomfort she wants to proceed this week and I will schedule for Thursday in the OR to allow her  to be more comfortable. I am unsure if she could be discharged given the current level of disability with her right leg.    Brandon C. Randie Heinz, MD [facility] [place] Office: [phone] Pager: [phone]  07/15/2016 9:52 AM

## 2016-07-15 NOTE — Progress Notes (Signed)
ANTICOAGULATION CONSULT NOTE - Follow Up Consult  Pharmacy Consult for Heparin Indication: RLE DVT  Patient Measurements: Height:  (157.5 cm) Weight: 241 lb 6.4 oz (109.5 kg) IBW/kg (Calculated) : 50.1 Heparin Dosing Weight: 78 kg  Vital Signs: Temp: 100.3 F (37.9 C) (05/01 0611) Temp Source: Oral (05/01 0611) BP: 141/59 (05/01 0611) Pulse Rate: 100 (05/01 0611)  Labs:  Recent Labs  07/12/16 1810  07/13/16 0400 07/14/16 0228 07/15/16 0224 07/15/16 0946  HGB  --   < > 8.5* 8.2* 7.8*  --   HCT  --   --  24.6* 24.3* 23.5*  --   PLT  --   --  124* 135* 130*  --   APTT 114*  --  84*  --   --   --   HEPARINUNFRC  --   < > 0.68 0.50 0.22* 0.34  CREATININE  --   --   --  0.94 0.94  --   < > = values in this interval not displayed.  Estimated Creatinine Clearance: 73.3 mL/min (by C-G formula based on SCr of 0.94 mg/dL).   Medications: Heparin @ 1050 units/hr  Assessment: 61 yo F with right leg DVT. CTA negative for PE. Originally started on Lovenox and transitioned to Xarelto on 4/25. Received 2 doses of Xarelto, then changed to IV heparin on 4/26 for mechanical thrombectomy on 4/30. This procedure was unsuccessful, will repeat intervention on 5/3.   Heparin was restarted last night. Heparin level 0.43, therapeutic on 1050 units/hr  Goal of Therapy:  Heparin level 0.3-0.7 Monitor platelets by anticoagulation protocol: Yes   Plan:  Continue heparin at current rate f/u daily heparin level f/u plan for repeat endovascular intervention on thursday  Bayard Hugger, PharmD, BCPS  Clinical Pharmacist  Pager: [phone]   07/15/2016 11:12 AM

## 2016-07-15 NOTE — Progress Notes (Signed)
ANTICOAGULATION CONSULT NOTE Pharmacy Consult for Heparin Indication: RLE DVT  Patient Measurements: Height:  (157.5 cm) Weight: 241 lb 6.4 oz (109.5 kg) IBW/kg (Calculated) : 50.1 Heparin Dosing Weight: 78 kg  Vital Signs: Temp: 98.6 F (37 C) (04/30 2216) Temp Source: Oral (04/30 2216) BP: 94/49 (04/30 2218) Pulse Rate: 97 (04/30 2218)  Labs:  Recent Labs  07/12/16 0843 07/12/16 1810  07/13/16 0400 07/14/16 0228 07/15/16 0224  HGB  --   --   < > 8.5* 8.2* 7.8*  HCT  --   --   --  24.6* 24.3* 23.5*  PLT  --   --   --  124* 135* 130*  APTT 127* 114*  --  84*  --   --   HEPARINUNFRC 1.11*  --   --  0.68 0.50 0.22*  CREATININE  --   --   --   --  0.94 0.94  < > = values in this interval not displayed.  Estimated Creatinine Clearance: 73.3 mL/min (by C-G formula based on SCr of 0.94 mg/dL).   Assessment: 61 yo F with right leg DVT for heparin.  Heparin level this morning drawn just 5 hours after restarted  Goal of Therapy:  Heparin level 0.3-0.7 Monitor platelets by anticoagulation protocol: Yes   Plan:  As patient was previously therapeutic at current rate, expect level to increase with time following restart.  No change to heparin for now, will recheck level later this morning  Geannie Risen, PharmD, BCPS   07/15/2016 4:34 AM

## 2016-07-15 NOTE — Progress Notes (Signed)
PROGRESS NOTE    Cassandra [name]  OHY:[id number] DOB: [birth year] DOA: 07/08/2016 PCP: No PCP Per Patient   Outpatient Specialists:    Brief Narrative:  Cassandra [name] is a 61 y.o. 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  . The only other change is noted was Dr. Milinda Antis of rheumatology was decreasing the amount of prednisone monthly. This month she decreased from 40 mg prednisone daily to 30.She presented with a one-day history of right lower extremity DVT .   Assessment & Plan:   Principal Problem:   AKI (acute kidney injury) ([facility]) Active Problems:   GERD (gastroesophageal reflux disease)   Positive ANA (antinuclear antibody)   Right leg swelling   Dehydration   Anemia   Prolonged QT interval   DVT (deep venous thrombosis) (HCC)    Fever Low-grade fever yesterday,  , chest x-ray, UA negative Will start incentive spirometry Likely due to dvt   DVT of RLE extensive swelling on right leg Vascular surgery consulted-failed endovascular intervention  4/30,repeat attempt at endovascular intervention  thursday Continue heparin gtt , appreciate Dr. Donzetta Matters recommendations    Acute kidney injury on chronic kidney disease stage III: Resolved -Urinalysis shows high specific gravity to suggest dehydration.   IVF NS at 100 ml/hr Creatinine down to 0.94     Hypokalemia Replete   Essential hypertension - Hold lisinopril and furosemide 2/2 AKI  Prolonged QTC: Patient's initial QTC is noted to be 605 - Avoid further QT prolonging medications. 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- lower than baseline  Continue prednisone, may need stress dose steroids prior to procedure 4/30   Anemia,leukopenia  Check ANC   Hemoglobin 10.9 on admission, now  8.8>8.2>7.8, may need transfusion before her procedure thursday Will need to be transfuse for hemoglobin less than 8.0 Check FOBT  Thrombocytopenia:   Improving, now 134 k >124k>135 K   No previous history of low platelets counts previously noted in the past. Previously her platelet count has been between 175-243    [name] - protonix  Sinus tachycardia/SVT on 4/26-- resolved with vagal maneuver (blowing in straw)-- added back PRN BB if BP can tolerate, blood pressure soft Klonopin for anxiety   DVT prophylaxis: Heparin drip     Code Status: Full Code   Family Communication:  patient appears to understand plan of care   Disposition Plan: Vascular procedure 5/3   Consultants:   vascular     Subjective: Febrile, no cough  Objective: Vitals:   07/14/16 2033 07/14/16 2216 07/14/16 2218 07/15/16 0611  BP: (!) 114/57  (!) 94/49 (!) 141/59  Pulse: (!) 107  97 100  Resp:    18  Temp: 99.2 F (37.3 C) 98.6 F (37 C)  100.3 F (37.9 C)  TempSrc: Oral Oral  Oral  SpO2: 92%  93% 96%  Weight:      Height:        Intake/Output Summary (Last 24 hours) at 07/15/16 1054 Last data filed at 07/15/16 0823  Gross per 24 hour  Intake            85.93 ml  Output              625 ml  Net          -539.07  ml   Filed Weights   07/08/16 1847 07/09/16 0140 07/10/16 0431  Weight: 107.5 kg (237 lb) 106.3 kg (234 lb 6.4 oz) 109.5 kg (241 lb 6.4 oz)    Examination:  General exam: in bed, eating breakfast Respiratory system: Clear to auscultation. Respiratory effort normal. Cardiovascular system: S1 & S2 heard, RRR. No JVD, murmurs, rubs, gallops or clicks.. Gastrointestinal system: Abdomen is nondistended, soft and nontender. No organomegaly or masses felt. Normal bowel sounds heard. Central nervous system: Alert and oriented. No focal neurological deficits. Right leg swollen, pulses palpable, + edema but warmth same as other leg    Data Reviewed: I have personally  reviewed following labs and imaging studies  CBC:  Recent Labs Lab 07/11/16 0444 07/12/16 0338 07/13/16 0400 07/14/16 0228 07/15/16 0224  WBC 3.6* 3.7* 2.5* 2.4* 1.5*  HGB 8.8* 8.8* 8.5* 8.2* 7.8*  HCT 25.9* 26.1* 24.6* 24.3* 23.5*  MCV 100.0 100.4* 100.4* 101.3* 102.2*  PLT 124* 134* 124* 135* 767*   Basic Metabolic Panel:  Recent Labs Lab 07/09/16 0244 07/10/16 0354 07/11/16 0444 07/12/16 0338 07/14/16 0228 07/15/16 0224  NA 136 137 139 138 139 138  K 3.2* 4.7 3.9 3.9 3.8 3.2*  CL 104 107 108 109 110 108  CO2 21* 23 23 23 22 25   GLUCOSE 111* 139* 120* 121* 109* 123*  BUN 16 12 13 13 9 8   CREATININE 1.34* 0.97 0.88 0.87 0.94 0.94  CALCIUM 8.3* 8.5* 8.5* 8.5* 8.5* 8.2*  MG 2.1  --   --   --   --   --    GFR: Estimated Creatinine Clearance: 73.3 mL/min (by C-G formula based on SCr of 0.94 mg/dL). Liver Function Tests:  Recent Labs Lab 07/12/16 0338 07/14/16 0228  AST 36 24  ALT 33 30  ALKPHOS 56 49  BILITOT 0.7 0.4  PROT 5.3* 4.9*  ALBUMIN 2.9* 2.7*   No results for input(s): LIPASE, AMYLASE in the last 168 hours. No results for input(s): AMMONIA in the last 168 hours. Coagulation Profile:  Recent Labs Lab 07/09/16 0008  INR 1.16   Cardiac Enzymes: No results for input(s): CKTOTAL, CKMB, CKMBINDEX, TROPONINI in the last 168 hours. BNP (last 3 results) No results for input(s): PROBNP in the last 8760 hours. HbA1C: No results for input(s): HGBA1C in the last 72 hours. CBG: No results for input(s): GLUCAP in the last 168 hours. Lipid Profile: No results for input(s): CHOL, HDL, LDLCALC, TRIG, CHOLHDL, LDLDIRECT in the last 72 hours. Thyroid Function Tests: No results for input(s): TSH, T4TOTAL, FREET4, T3FREE, THYROIDAB in the last 72 hours. Anemia Panel: No results for input(s): VITAMINB12, FOLATE, FERRITIN, TIBC, IRON, RETICCTPCT in the last 72 hours. Urine analysis:    Component Value Date/Time   COLORURINE COLORLESS (A) 07/13/2016 1651    APPEARANCEUR CLEAR 07/13/2016 1651   LABSPEC 1.002 (L) 07/13/2016 1651   PHURINE 6.0 07/13/2016 1651   GLUCOSEU NEGATIVE 07/13/2016 1651   GLUCOSEU NEGATIVE 11/30/2008 1522   HGBUR SMALL (A) 07/13/2016 1651   BILIRUBINUR NEGATIVE 07/13/2016 1651   KETONESUR NEGATIVE 07/13/2016 1651   PROTEINUR NEGATIVE 07/13/2016 1651   UROBILINOGEN 1 06/02/2013 1524   NITRITE NEGATIVE 07/13/2016 1651   LEUKOCYTESUR NEGATIVE 07/13/2016 1651     ) Recent Results (from the past 240 hour(s))  Culture, blood (routine x 2)     Status: None (Preliminary result)   Collection Time: 07/13/16 10:47 AM  Result Value Ref Range Status   Specimen Description BLOOD BLOOD RIGHT HAND  Final   Special Requests IN PEDIATRIC BOTTLE Blood Culture adequate volume  Final   Culture NO GROWTH 2 DAYS  Final   Report Status PENDING  Incomplete  Culture, blood (routine x 2)     Status: None (Preliminary result)   Collection Time: 07/13/16 10:55 AM  Result Value Ref Range Status   Specimen Description BLOOD BLOOD LEFT HAND  Final   Special Requests IN PEDIATRIC BOTTLE Blood Culture adequate volume  Final   Culture NO GROWTH 2 DAYS  Final   Report Status PENDING  Incomplete      Anti-infectives    Start     Dose/Rate Route Frequency Ordered Stop   07/14/16 0900  ceFAZolin (ANCEF) IVPB 2g/100 mL premix  Status:  Discontinued    Comments:  Send with pt to OR I am aware of her Ampicillin allergy thanks   2 g 200 mL/hr over 30 Minutes Intravenous To Short Stay 07/14/16 0528 07/14/16 1721       Radiology Studies: Dg Chest 2 View  Result Date: 07/13/2016 CLINICAL DATA:  Fever starting today. Hx of heart murmur, hypertension, supraventricular tachycardia and wegner's disease. Pt c/o off and on cough that started the other day. EXAM: CHEST  2 VIEW COMPARISON:  03/16/2016 FINDINGS: Cardiac silhouette is mildly enlarged. No mediastinal or hilar masses. No evidence of adenopathy. Clear lungs.  No pleural effusion or  pneumothorax. Skeletal structures are demineralized but intact. IMPRESSION: No acute cardiopulmonary disease. Electronically Signed   By: Lajean Manes M.D.   On: 07/13/2016 11:45        Scheduled Meds: . cyclophosphamide  150 mg Oral Daily  . darifenacin  7.5 mg Oral Daily  . metoprolol tartrate  25 mg Oral BID  . pantoprazole  40 mg Oral Daily  . potassium chloride  40 mEq Oral BID  . predniSONE  30 mg Oral Daily  . sertraline  100 mg Oral QHS   Continuous Infusions: . sodium chloride    . heparin 1,050 Units/hr (07/15/16 0705)     LOS: 5 days    Time spent: 25 min    Reyne Dumas, DO [facility] Pager [phone]  If 7PM-7AM, please contact night-coverage [email or web address] Password [email or web address] 07/15/2016, 10:54 AM

## 2016-07-16 LAB — CBC WITH DIFFERENTIAL/PLATELET
BASOS ABS: 0 10*3/uL (ref 0.0–0.1)
BASOS ABS: 0 10*3/uL (ref 0.0–0.1)
BASOS PCT: 1 %
Basophils Relative: 1 %
EOS ABS: 0 10*3/uL (ref 0.0–0.7)
EOS PCT: 2 %
Eosinophils Absolute: 0 10*3/uL (ref 0.0–0.7)
Eosinophils Relative: 2 %
HCT: 24.7 % — ABNORMAL LOW (ref 36.0–46.0)
HEMATOCRIT: 23.7 % — AB (ref 36.0–46.0)
Hemoglobin: 8 g/dL — ABNORMAL LOW (ref 12.0–15.0)
Hemoglobin: 8.1 g/dL — ABNORMAL LOW (ref 12.0–15.0)
LYMPHS ABS: 0.1 10*3/uL — AB (ref 0.7–4.0)
LYMPHS PCT: 6 %
Lymphocytes Relative: 5 %
Lymphs Abs: 0.1 10*3/uL — ABNORMAL LOW (ref 0.7–4.0)
MCH: 34.2 pg — AB (ref 26.0–34.0)
MCH: 33.2 pg (ref 26.0–34.0)
MCHC: 33.8 g/dL (ref 30.0–36.0)
MCHC: 32.8 g/dL (ref 30.0–36.0)
MCV: 101.3 fL — ABNORMAL HIGH (ref 78.0–100.0)
MCV: 101.2 fL — ABNORMAL HIGH (ref 78.0–100.0)
MONOS PCT: 3 %
Monocytes Absolute: 0 10*3/uL — ABNORMAL LOW (ref 0.1–1.0)
Monocytes Absolute: 0.1 10*3/uL (ref 0.1–1.0)
Monocytes Relative: 2 %
NEUTROS ABS: 1.6 10*3/uL — AB (ref 1.7–7.7)
NEUTROS PCT: 89 %
Neutro Abs: 1.9 10*3/uL (ref 1.7–7.7)
Neutrophils Relative %: 89 %
Platelets: 135 10*3/uL — ABNORMAL LOW (ref 150–400)
Platelets: 128 10*3/uL — ABNORMAL LOW (ref 150–400)
RBC: 2.34 MIL/uL — AB (ref 3.87–5.11)
RBC: 2.44 MIL/uL — ABNORMAL LOW (ref 3.87–5.11)
RDW: 14.8 % (ref 11.5–15.5)
RDW: 15 % (ref 11.5–15.5)
WBC: 1.8 10*3/uL — AB (ref 4.0–10.5)
WBC: 2.2 10*3/uL — ABNORMAL LOW (ref 4.0–10.5)

## 2016-07-16 LAB — COMPREHENSIVE METABOLIC PANEL
ALK PHOS: 47 U/L (ref 38–126)
ALT: 27 U/L (ref 14–54)
AST: 28 U/L (ref 15–41)
Albumin: 2.5 g/dL — ABNORMAL LOW (ref 3.5–5.0)
Anion gap: 8 (ref 5–15)
BILIRUBIN TOTAL: 0.5 mg/dL (ref 0.3–1.2)
BUN: 7 mg/dL (ref 6–20)
CALCIUM: 8.5 mg/dL — AB (ref 8.9–10.3)
CHLORIDE: 106 mmol/L (ref 101–111)
CO2: 24 mmol/L (ref 22–32)
CREATININE: 0.9 mg/dL (ref 0.44–1.00)
GFR calc non Af Amer: >60 mL/min (ref >60)
Glucose, Bld: 99 mg/dL (ref 65–99)
Potassium: 3.8 mmol/L (ref 3.5–5.1)
Sodium: 138 mmol/L (ref 135–145)
TOTAL PROTEIN: 4.8 g/dL — AB (ref 6.5–8.1)

## 2016-07-16 LAB — HEPARIN LEVEL (UNFRACTIONATED): HEPARIN UNFRACTIONATED: 0.43 [IU]/mL (ref 0.30–0.70)

## 2016-07-16 LAB — PREPARE RBC (CROSSMATCH)

## 2016-07-16 MED ORDER — MAGNESIUM SULFATE 2 GM/50ML IV SOLN
2.0000 g | Freq: Once | INTRAVENOUS | Status: AC
  Administered 2016-07-16: 2 g via INTRAVENOUS
  Filled 2016-07-16: qty 50

## 2016-07-16 NOTE — Progress Notes (Signed)
ANTICOAGULATION CONSULT NOTE - Follow Up Consult  Pharmacy Consult for Heparin Indication: RLE DVT  Patient Measurements: Height:  (157.5 cm) Weight: 241 lb 6.4 oz (109.5 kg) IBW/kg (Calculated) : 50.1 Heparin Dosing Weight: 78 kg  Vital Signs: Temp: 98.8 F (37.1 C) (05/02 0436) Temp Source: Oral (05/02 0436) BP: 118/53 (05/02 0436) Pulse Rate: 75 (05/02 0436)  Labs:  Recent Labs  07/14/16 0228 07/15/16 0224 07/15/16 0946 07/16/16 0437  HGB 8.2* 7.8*  --  8.0*  HCT 24.3* 23.5*  --  23.7*  PLT 135* 130*  --  135*  HEPARINUNFRC 0.50 0.22* 0.34 0.43  CREATININE 0.94 0.94  --  0.90    Estimated Creatinine Clearance: 76.6 mL/min (by C-G formula based on SCr of 0.9 mg/dL).   Medications: Heparin @ 1050 units/hr  Assessment: 61 yo F with right leg DVT. CTA negative for PE. Originally started on Lovenox and transitioned to Xarelto on 4/25. Received 2 doses of Xarelto, then changed to IV heparin on 4/26 for mechanical thrombectomy on 4/30. This procedure was unsuccessful, heparin was resumed, and plan for repeat intervention on 5/3. Heparin level is therapeutic at 0.43. Hgb low but stable at 8.0.  Goal of Therapy:  Heparin level 0.3-0.7 Monitor platelets by anticoagulation protocol: Yes   Plan:  1) Continue heparin at 1050 units/hr 2) Daily heparin level and CBC 3) Repeat intervention tomorrow  Louie Casa, PharmD, BCPS 07/16/2016 8:38 AM

## 2016-07-16 NOTE — Progress Notes (Addendum)
PROGRESS NOTE    Cassandra [name]  CHY:[id number] DOB: August 30, 1955 DOA: 07/08/2016 PCP: No PCP Per Patient   Outpatient Specialists:    Brief Narrative:  Cassandra [name] is a 61 y.o. 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  . The only other change is noted was Dr. Milinda Antis of rheumatology was decreasing the amount of prednisone monthly. This month she decreased from 40 mg prednisone daily to 30.She presented with a one-day history of right lower extremity DVT .   Assessment & Plan:   Principal Problem:   AKI (acute kidney injury) ([facility]) Active Problems:   GERD (gastroesophageal reflux disease)   Positive ANA (antinuclear antibody)   Right leg swelling   Dehydration   Anemia   Prolonged QT interval   DVT (deep venous thrombosis) ([facility])    Fever Low-grade fever yesterday,  , chest x-ray, UA negative Will start incentive spirometry Likely due to dvt   DVT of RLE extensive swelling on right leg Vascular surgery consulted-failed endovascular intervention  4/30,repeat attempt at endovascular intervention  thursday Continue heparin gtt , appreciate Dr. Donzetta Matters recommendations    Acute kidney injury on chronic kidney disease stage III: Resolved Urinalysis shows high specific gravity to suggest dehydration.  IVF NS at 75  ml/hr Creatinine down to 0.94     Hypokalemia Replete   Essential hypertension - Hold lisinopril and furosemide 2/2 AKI  Prolonged QTC: Patient's initial QTC is noted to be 605 - Avoid further QT prolonging medications. 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- lower than baseline  Continue prednisone, may need stress dose steroids prior to procedure 4/30   Anemia,leukopenia  ANC greater than 1000  Hemoglobin 10.9 on admission,  now 8.8>8.2>7.8>8.0, may need transfusion before her procedure thursday Pancytopenia could be secondary to Cytoxan, Cytoxan on hold Will need to be transfuse for hemoglobin less than 8.0 Check FOBT Patient is followed by Dr Lavonia Dana , nephrology in [place], who prescribes to Cytoxan  Hypomagnesemia Replete  Thrombocytopenia:   Improving, now 134 k >124k>135 K   No previous history of low platelets counts previously noted in the past. Previously her platelet count has been between Carter - protonix  Sinus tachycardia/SVT on 4/26-- resolved with vagal maneuver (blowing in straw)-- added back PRN BB if BP can tolerate, blood pressure soft Klonopin for anxiety   DVT prophylaxis: Heparin drip     Code Status: Full Code   Family Communication:  patient appears to understand plan of care   Disposition Plan: Vascular procedure 5/3   Consultants:   vascular     Subjective: Appears comfortable,  afebrile overnight  Objective: Vitals:   07/15/16 1139 07/15/16 1417 07/15/16 2221 07/16/16 0436  BP: 128/69 117/61 106/61 (!) 118/53  Pulse: 84 80 68 75  Resp:  _0 Temp:  97.9 F (36.6 C) 98.4 F (36.9 C) 98.8 F (37.1 C)  TempSrc:  Oral Oral Oral  SpO2:  93% 95% 93%  Weight:      Height:        Intake/Output Summary (Last 24 hours) at 07/16/16 0829 Last data filed at 07/16/16 8786  Gross per 24 hour  Intake          1673.48 ml  Output  1025 ml  Net           648.48 ml   Filed Weights   07/08/16 1847 07/09/16 0140 07/10/16 0431  Weight: 107.5 kg (237 lb) 106.3 kg (234 lb 6.4 oz) 109.5 kg (241 lb 6.4 oz)    Examination:  General exam: in bed, eating breakfast Respiratory system: Clear to auscultation. Respiratory effort normal. Cardiovascular system: S1 & S2 heard, RRR. No JVD, murmurs, rubs, gallops or clicks.. Gastrointestinal system: Abdomen is nondistended, soft and nontender. No organomegaly or masses felt. Normal  bowel sounds heard. Central nervous system: Alert and oriented. No focal neurological deficits. Right leg swollen, pulses palpable, + edema but warmth same as other leg    Data Reviewed: I have personally reviewed following labs and imaging studies  CBC:  Recent Labs Lab 07/12/16 0338 07/13/16 0400 07/14/16 0228 07/15/16 0224 07/16/16 0437  WBC 3.7* 2.5* 2.4* 1.5* 1.8*  NEUTROABS  --   --   --   --  1.6*  HGB 8.8* 8.5* 8.2* 7.8* 8.0*  HCT 26.1* 24.6* 24.3* 23.5* 23.7*  MCV 100.4* 100.4* 101.3* 102.2* 101.3*  PLT 134* 124* 135* 130* 135*   Basic Metabolic Panel:  Recent Labs Lab 07/11/16 0444 07/12/16 0338 07/14/16 0228 07/15/16 0224 07/15/16 1102 07/16/16 0437  NA 139 138 139 138  --  138  K 3.9 3.9 3.8 3.2*  --  3.8  CL 108 109 110 108  --  106  CO2 23 23 22 25  --  24  GLUCOSE 120* 121* 109* 123*  --  99  BUN 13 13 9 8  --  7  CREATININE 0.88 0.87 0.94 0.94  --  0.90  CALCIUM 8.5* 8.5* 8.5* 8.2*  --  8.5*  MG  --   --   --   --  1.6*  --    GFR: Estimated Creatinine Clearance: 76.6 mL/min (by C-G formula based on SCr of 0.9 mg/dL). Liver Function Tests:  Recent Labs Lab 07/12/16 0338 07/14/16 0228 07/16/16 0437  AST 36 24 28  ALT 33 30 27  ALKPHOS 56 49 47  BILITOT 0.7 0.4 0.5  PROT 5.3* 4.9* 4.8*  ALBUMIN 2.9* 2.7* 2.5*   No results for input(s): LIPASE, AMYLASE in the last 168 hours. No results for input(s): AMMONIA in the last 168 hours. Coagulation Profile: No results for input(s): INR, PROTIME in the last 168 hours. Cardiac Enzymes: No results for input(s): CKTOTAL, CKMB, CKMBINDEX, TROPONINI in the last 168 hours. BNP (last 3 results) No results for input(s): PROBNP in the last 8760 hours. HbA1C: No results for input(s): HGBA1C in the last 72 hours. CBG: No results for input(s): GLUCAP in the last 168 hours. Lipid Profile: No results for input(s): CHOL, HDL, LDLCALC, TRIG, CHOLHDL, LDLDIRECT in the last 72 hours. Thyroid Function  Tests: No results for input(s): TSH, T4TOTAL, FREET4, T3FREE, THYROIDAB in the last 72 hours. Anemia Panel: No results for input(s): VITAMINB12, FOLATE, FERRITIN, TIBC, IRON, RETICCTPCT in the last 72 hours. Urine analysis:    Component Value Date/Time   COLORURINE COLORLESS (A) 07/13/2016 1651   APPEARANCEUR CLEAR 07/13/2016 1651   LABSPEC 1.002 (L) 07/13/2016 1651   PHURINE 6.0 07/13/2016 1651   GLUCOSEU NEGATIVE 07/13/2016 1651   GLUCOSEU NEGATIVE 11/30/2008 1522   HGBUR SMALL (A) 07/13/2016 1651   BILIRUBINUR NEGATIVE 07/13/2016 1651   KETONESUR NEGATIVE 07/13/2016 1651   PROTEINUR NEGATIVE 07/13/2016 1651   UROBILINOGEN 1 06/02/2013 1524   NITRITE NEGATIVE 07/13/2016   1651   LEUKOCYTESUR NEGATIVE 07/13/2016 1651     ) Recent Results (from the past 240 hour(s))  Culture, blood (routine x 2)     Status: None (Preliminary result)   Collection Time: 07/13/16 10:47 AM  Result Value Ref Range Status   Specimen Description BLOOD BLOOD RIGHT HAND  Final   Special Requests IN PEDIATRIC BOTTLE Blood Culture adequate volume  Final   Culture NO GROWTH 2 DAYS  Final   Report Status PENDING  Incomplete  Culture, blood (routine x 2)     Status: None (Preliminary result)   Collection Time: 07/13/16 10:55 AM  Result Value Ref Range Status   Specimen Description BLOOD BLOOD LEFT HAND  Final   Special Requests IN PEDIATRIC BOTTLE Blood Culture adequate volume  Final   Culture NO GROWTH 2 DAYS  Final   Report Status PENDING  Incomplete      Anti-infectives    Start     Dose/Rate Route Frequency Ordered Stop   07/14/16 0900  ceFAZolin (ANCEF) IVPB 2g/100 mL premix  Status:  Discontinued    Comments:  Send with pt to OR I am aware of her Ampicillin allergy thanks   2 g 200 mL/hr over 30 Minutes Intravenous To Short Stay 07/14/16 0528 07/14/16 1721       Radiology Studies: No results found.      Scheduled Meds: . darifenacin  7.5 mg Oral Daily  . metoprolol tartrate  25 mg  Oral BID  . pantoprazole  40 mg Oral Daily  . potassium chloride  40 mEq Oral BID  . predniSONE  30 mg Oral Daily  . sertraline  100 mg Oral QHS   Continuous Infusions: . sodium chloride 75 mL/hr at 07/15/16 2345  . sodium chloride    . heparin 1,050 Units/hr (07/15/16 1848)  . magnesium sulfate 1 - 4 g bolus IVPB       LOS: 6 days    Time spent: 25 min     , DO [facility] Pager [phone]  If 7PM-7AM, please contact night-coverage [email or web address] Password [email or web address] 07/16/2016, 8:29 AM   

## 2016-07-16 NOTE — Progress Notes (Addendum)
[facility]  Subjective  - Doing OK.     Objective (!) 118/53 75 98.8 F (37.1 C) (Oral) 20 93%  Intake/Output Summary (Last 24 hours) at 07/16/16 0756 Last data filed at 07/16/16 0454  Gross per 24 hour  Intake          1673.48 ml  Output             1150 ml  Net           523.48 ml    Right LE significant edema without skin changes Right LE active ranage of motion intact and sensation intact  Assessment/Planning: S/P attempted endovascular intervention for right LE DVT Repeat attempt at endovascular intervention planned for Thursday 07/17/2016 by Dr. Randie Heinz On IV heparin NPO past MN   Clinton Gallant [facility] 07/16/2016 7:56 AM --  Laboratory Lab Results:  Recent Labs  07/15/16 0224 07/16/16 0437  WBC 1.5* 1.8*  HGB 7.8* 8.0*  HCT 23.5* 23.7*  PLT 130* 135*   BMET  Recent Labs  07/15/16 0224 07/16/16 0437  NA 138 138  K 3.2* 3.8  CL 108 106  CO2 25 24  GLUCOSE 123* 99  BUN 8 7  CREATININE 0.94 0.90  CALCIUM 8.2* 8.5*    COAG Lab Results  Component Value Date   INR 1.16 07/09/2016   INR 1.03 03/18/2016   INR 1.00 03/15/2016   No results found for: PTT  I have independently interviewed patient and agree with PA assessment and plan above. No overt signs of bleeding with pancytopenia. Would like to delay procedure but patient has signficant disability from current edema. Npo at midnight and will retry tomorrow with general anesthesia.   Kahlil Cowans C. Randie Heinz, MD [facility] Office: [phone] Pager: [phone]

## 2016-07-17 ENCOUNTER — Inpatient Hospital Stay (HOSPITAL_COMMUNITY): Payer: Medicare HMO | Admitting: Anesthesiology

## 2016-07-17 ENCOUNTER — Encounter (HOSPITAL_COMMUNITY): Payer: Self-pay | Admitting: Anesthesiology

## 2016-07-17 ENCOUNTER — Encounter (HOSPITAL_COMMUNITY)
Admission: EM | Disposition: A | Discharge status: Home Health | Payer: Self-pay | Source: Home / Self Care | Attending: Internal Medicine

## 2016-07-17 HISTORY — PX: PERCUTANEOUS VENOUS THROMBECTOMY,LYSIS WITH INTRAVASCULAR ULTRASOUND (IVUS): SHX6751

## 2016-07-17 HISTORY — PX: VENOGRAM: SHX5497

## 2016-07-17 HISTORY — PX: ULTRASOUND GUIDANCE FOR VASCULAR ACCESS: SHX6516

## 2016-07-17 LAB — HEPARIN LEVEL (UNFRACTIONATED): Heparin Unfractionated: 0.47 IU/mL (ref 0.30–0.70)

## 2016-07-17 LAB — BASIC METABOLIC PANEL
ANION GAP: 8 (ref 5–15)
BUN: 9 mg/dL (ref 6–20)
CALCIUM: 8.6 mg/dL — AB (ref 8.9–10.3)
CO2: 24 mmol/L (ref 22–32)
CREATININE: 0.86 mg/dL (ref 0.44–1.00)
Chloride: 104 mmol/L (ref 101–111)
Glucose, Bld: 113 mg/dL — ABNORMAL HIGH (ref 65–99)
Potassium: 4 mmol/L (ref 3.5–5.1)
SODIUM: 136 mmol/L (ref 135–145)

## 2016-07-17 LAB — PROTIME-INR
INR: 1.1
PROTHROMBIN TIME: 14.3 s (ref 11.4–15.2)

## 2016-07-17 SURGERY — VENOGRAM
Anesthesia: General | Site: Leg Upper | Laterality: Right

## 2016-07-17 MED ORDER — SODIUM CHLORIDE 0.9 % IV SOLN
INTRAVENOUS | Status: DC | PRN
  Administered 2016-07-17: 1000 mL via INTRACORONARY

## 2016-07-17 MED ORDER — PHENYLEPHRINE HCL 10 MG/ML IJ SOLN
INTRAMUSCULAR | Status: DC | PRN
  Administered 2016-07-17: 80 ug via INTRAVENOUS
  Administered 2016-07-17 (×2): 120 ug via INTRAVENOUS
  Administered 2016-07-17: 80 ug via INTRAVENOUS

## 2016-07-17 MED ORDER — DEXAMETHASONE SODIUM PHOSPHATE 10 MG/ML IJ SOLN
INTRAMUSCULAR | Status: AC
  Filled 2016-07-17: qty 1

## 2016-07-17 MED ORDER — PHENYLEPHRINE HCL 10 MG/ML IJ SOLN
INTRAVENOUS | Status: DC | PRN
  Administered 2016-07-17: 30 ug/min via INTRAVENOUS

## 2016-07-17 MED ORDER — ONDANSETRON HCL 4 MG/2ML IJ SOLN
INTRAMUSCULAR | Status: AC
  Filled 2016-07-17: qty 2

## 2016-07-17 MED ORDER — HEPARIN SODIUM (PORCINE) 1000 UNIT/ML IJ SOLN
INTRAMUSCULAR | Status: DC | PRN
  Administered 2016-07-17: 6000 [IU] via INTRAVENOUS
  Administered 2016-07-17: 3000 [IU] via INTRAVENOUS

## 2016-07-17 MED ORDER — PROPOFOL 10 MG/ML IV BOLUS
INTRAVENOUS | Status: AC
  Filled 2016-07-17: qty 20

## 2016-07-17 MED ORDER — SUGAMMADEX SODIUM 500 MG/5ML IV SOLN
INTRAVENOUS | Status: DC | PRN
  Administered 2016-07-17: 220 mg via INTRAVENOUS

## 2016-07-17 MED ORDER — PROPOFOL 10 MG/ML IV BOLUS
INTRAVENOUS | Status: DC | PRN
  Administered 2016-07-17: 40 mg via INTRAVENOUS
  Administered 2016-07-17: 110 mg via INTRAVENOUS

## 2016-07-17 MED ORDER — 0.9 % SODIUM CHLORIDE (POUR BTL) OPTIME
TOPICAL | Status: DC | PRN
  Administered 2016-07-17: 1000 mL

## 2016-07-17 MED ORDER — ONDANSETRON HCL 4 MG/2ML IJ SOLN
INTRAMUSCULAR | Status: DC | PRN
  Administered 2016-07-17: 4 mg via INTRAVENOUS

## 2016-07-17 MED ORDER — SCOPOLAMINE 1 MG/3DAYS TD PT72
MEDICATED_PATCH | TRANSDERMAL | Status: AC
  Filled 2016-07-17: qty 1

## 2016-07-17 MED ORDER — HEPARIN SODIUM (PORCINE) 1000 UNIT/ML IJ SOLN
INTRAMUSCULAR | Status: AC
  Filled 2016-07-17: qty 1

## 2016-07-17 MED ORDER — LIDOCAINE HCL (CARDIAC) 20 MG/ML IV SOLN
INTRAVENOUS | Status: DC | PRN
  Administered 2016-07-17: 60 mg via INTRAVENOUS

## 2016-07-17 MED ORDER — SCOPOLAMINE 1 MG/3DAYS TD PT72: 1.0000 | MEDICATED_PATCH | TRANSDERMAL | Status: DC

## 2016-07-17 MED ORDER — DEXAMETHASONE SODIUM PHOSPHATE 10 MG/ML IJ SOLN
INTRAMUSCULAR | Status: DC | PRN
  Administered 2016-07-17: 10 mg via INTRAVENOUS

## 2016-07-17 MED ORDER — IODIXANOL 320 MG/ML IV SOLN
INTRAVENOUS | Status: DC | PRN
  Administered 2016-07-17: 5 mL via INTRAVENOUS

## 2016-07-17 MED ORDER — VANCOMYCIN HCL IN DEXTROSE 1-5 GM/200ML-% IV SOLN
INTRAVENOUS | Status: AC
  Filled 2016-07-17: qty 200

## 2016-07-17 MED ORDER — LACTATED RINGERS IV SOLN
INTRAVENOUS | Status: DC | PRN
  Administered 2016-07-17 (×3): via INTRAVENOUS

## 2016-07-17 MED ORDER — ROCURONIUM BROMIDE 100 MG/10ML IV SOLN
INTRAVENOUS | Status: DC | PRN
  Administered 2016-07-17: 10 mg via INTRAVENOUS
  Administered 2016-07-17: 70 mg via INTRAVENOUS

## 2016-07-17 MED ORDER — MIDAZOLAM HCL 2 MG/2ML IJ SOLN
INTRAMUSCULAR | Status: AC
  Filled 2016-07-17: qty 2

## 2016-07-17 MED ORDER — SODIUM CHLORIDE 0.9 % IV SOLN
INTRAVENOUS | Status: DC
  Administered 2016-07-17 – 2016-07-19 (×5): via INTRAVENOUS

## 2016-07-17 MED ORDER — LIDOCAINE 2% (20 MG/ML) 5 ML SYRINGE
INTRAMUSCULAR | Status: AC
  Filled 2016-07-17: qty 5

## 2016-07-17 MED ORDER — SUGAMMADEX SODIUM 500 MG/5ML IV SOLN
INTRAVENOUS | Status: AC
  Filled 2016-07-17: qty 5

## 2016-07-17 MED ORDER — PHENYLEPHRINE 40 MCG/ML (10ML) SYRINGE FOR IV PUSH (FOR BLOOD PRESSURE SUPPORT)
PREFILLED_SYRINGE | INTRAVENOUS | Status: AC
  Filled 2016-07-17: qty 10

## 2016-07-17 MED ORDER — ALTEPLASE 100 MG IV SOLR
INTRAVENOUS | Status: DC | PRN
  Administered 2016-07-17: 10 mg via INTRAVENOUS

## 2016-07-17 MED ORDER — VANCOMYCIN HCL 1000 MG IV SOLR
1000.0000 mg | Freq: Two times a day (BID) | INTRAVENOUS | Status: DC
  Administered 2016-07-17: 1000 mg via INTRAVENOUS

## 2016-07-17 MED ORDER — FENTANYL CITRATE (PF) 250 MCG/5ML IJ SOLN
INTRAMUSCULAR | Status: AC
  Filled 2016-07-17: qty 5

## 2016-07-17 MED ORDER — SODIUM CHLORIDE 0.9 % IV SOLN
INTRAVENOUS | Status: AC
  Filled 2016-07-17: qty 100

## 2016-07-17 MED ORDER — FENTANYL CITRATE (PF) 100 MCG/2ML IJ SOLN
INTRAMUSCULAR | Status: DC | PRN
  Administered 2016-07-17: 50 ug via INTRAVENOUS
  Administered 2016-07-17: 100 ug via INTRAVENOUS

## 2016-07-17 MED ORDER — PROTAMINE SULFATE 10 MG/ML IV SOLN
INTRAVENOUS | Status: AC
  Filled 2016-07-17: qty 5

## 2016-07-17 MED ORDER — SODIUM CHLORIDE 0.9 % IV SOLN
INTRAVENOUS | Status: DC | PRN
  Administered 2016-07-17: 500 mL

## 2016-07-17 MED ORDER — METOPROLOL TARTRATE 12.5 MG HALF TABLET
25.0000 mg | ORAL_TABLET | Freq: Once | ORAL | Status: AC
  Administered 2016-07-17: 25 mg via ORAL

## 2016-07-17 SURGICAL SUPPLY — 54 items
BAG BANDED W/RUBBER/TAPE 36X54 (MISCELLANEOUS) ×6 IMPLANT
BAG SNAP BAND KOVER 36X36 (MISCELLANEOUS) IMPLANT
BALLN MUSTANG 12.0X40 75 (BALLOONS) ×3
BALLN MUSTANG 8X60X75 (BALLOONS) ×3
BALLOON MUSTANG 12.0X40 75 (BALLOONS) ×2 IMPLANT
BALLOON MUSTANG 8X60X75 (BALLOONS) ×2 IMPLANT
BANDAGE ACE 4X5 VEL STRL LF (GAUZE/BANDAGES/DRESSINGS) ×3 IMPLANT
BANDAGE ACE 6X5 VEL STRL LF (GAUZE/BANDAGES/DRESSINGS) ×3 IMPLANT
BLADE SURG 11 STRL SS (BLADE) ×3 IMPLANT
CANISTER SUCT 3000ML PPV (MISCELLANEOUS) ×3 IMPLANT
CATH ANGIO 5F BER2 65CM (CATHETERS) ×3 IMPLANT
CATH OMNI FLUSH .035X70CM (CATHETERS) IMPLANT
CATH QUICKCROSS .035X135CM (MICROCATHETER) ×3 IMPLANT
CATH VISIONS PV .035 IVUS (CATHETERS) ×3 IMPLANT
COVER DOME SNAP 22 D (MISCELLANEOUS) ×3 IMPLANT
COVER PROBE W GEL 5X96 (DRAPES) ×3 IMPLANT
COVER SURGICAL LIGHT HANDLE (MISCELLANEOUS) ×3 IMPLANT
DERMABOND ADVANCED (GAUZE/BANDAGES/DRESSINGS) ×1
DERMABOND ADVANCED .7 DNX12 (GAUZE/BANDAGES/DRESSINGS) ×2 IMPLANT
DEVICE TORQUE KENDALL .025-038 (MISCELLANEOUS) ×3 IMPLANT
DRAPE FEMORAL ANGIO 80X135IN (DRAPES) ×3 IMPLANT
GAUZE SPONGE 4X4 16PLY XRAY LF (GAUZE/BANDAGES/DRESSINGS) ×3 IMPLANT
GLIDEWIRE ANGLED SS 035X260CM (WIRE) IMPLANT
GLOVE BIO SURGEON STRL SZ7 (GLOVE) ×3 IMPLANT
GLOVE BIO SURGEON STRL SZ7.5 (GLOVE) ×6 IMPLANT
GLOVE BIOGEL PI IND STRL 6.5 (GLOVE) ×2 IMPLANT
GLOVE BIOGEL PI INDICATOR 6.5 (GLOVE) ×1
GOWN STRL REUS W/ TWL LRG LVL3 (GOWN DISPOSABLE) ×2 IMPLANT
GOWN STRL REUS W/ TWL XL LVL3 (GOWN DISPOSABLE) ×2 IMPLANT
GOWN STRL REUS W/TWL LRG LVL3 (GOWN DISPOSABLE) ×1
GOWN STRL REUS W/TWL XL LVL3 (GOWN DISPOSABLE) ×1
GUIDEWIRE ANGLED .035X260CM (WIRE) ×3 IMPLANT
KIT BASIN OR (CUSTOM PROCEDURE TRAY) ×3 IMPLANT
KIT ENCORE 26 ADVANTAGE (KITS) ×3 IMPLANT
KIT ROOM TURNOVER OR (KITS) ×3 IMPLANT
NEEDLE 18GX1X1/2 (RX/OR ONLY) (NEEDLE) ×3 IMPLANT
NEEDLE PERC 18GX7CM (NEEDLE) IMPLANT
NS IRRIG 1000ML POUR BTL (IV SOLUTION) ×3 IMPLANT
PAD ARMBOARD 7.5X6 YLW CONV (MISCELLANEOUS) ×6 IMPLANT
PROTECTION STATION PRESSURIZED (MISCELLANEOUS) ×3
SET MICROPUNCTURE 5F STIFF (MISCELLANEOUS) ×3 IMPLANT
SET ZELANTE DVT THROMB (CATHETERS) ×3 IMPLANT
SHEATH AVANTI 11CM 5FR (MISCELLANEOUS) ×3 IMPLANT
SHEATH PINNACLE 9F 10CM (SHEATH) ×3 IMPLANT
STATION PROTECTION PRESSURIZED (MISCELLANEOUS) ×2 IMPLANT
SYR 10ML LL (SYRINGE) ×9 IMPLANT
SYR 20CC LL (SYRINGE) ×6 IMPLANT
SYR 5ML LL (SYRINGE) ×3 IMPLANT
TOWEL OR 17X24 6PK STRL BLUE (TOWEL DISPOSABLE) IMPLANT
TOWEL OR 17X26 10 PK STRL BLUE (TOWEL DISPOSABLE) ×3 IMPLANT
TRAY FOLEY W/METER SILVER 16FR (SET/KITS/TRAYS/PACK) ×3 IMPLANT
WIRE AMPLATZ SS-J .035X260CM (WIRE) ×3 IMPLANT
WIRE BENTSON .035X145CM (WIRE) ×3 IMPLANT
WIRE MINI STICK MAX (SHEATH) ×3 IMPLANT

## 2016-07-17 NOTE — Transfer of Care (Signed)
Immediate Anesthesia Transfer of Care Note  Patient: Nat MathRhonda L Lacek  Procedure(s) Performed: Procedure(s): right ASCENDING VENOGRAM WITH ANGIOJET (Right) PERCUTANEOUS VENOUS THROMBECTOMY AND LYSIS WITH INTRAVASCULAR ULTRASOUND (IVUS) of right lower extermity with balloon angioplasty (Right) ULTRASOUND GUIDANCE FOR VASCULAR ACCESS (Right)  Patient Location: PACU  Anesthesia Type:General  Level of Consciousness: awake, oriented and patient cooperative  Airway & Oxygen Therapy: Patient Spontanous Breathing and Patient connected to face mask oxygen  Post-op Assessment: Report given to RN and Post -op Vital signs reviewed and stable  Post vital signs: Reviewed  Last Vitals:  Vitals:   07/17/16 0517 07/17/16 1216  BP: (!) 116/54 (P) 134/70  Pulse: 79 (P) 83  Resp: 18 (!) (P) 24  Temp: 36.9 C     Last Pain:  Vitals:   07/17/16 1216  TempSrc:   PainSc: (P) 0-No pain      Patients Stated Pain Goal: 0 (07/15/16 2030)  Complications: No apparent anesthesia complications

## 2016-07-17 NOTE — Op Note (Signed)
Patient name: Cassandra MathRhonda L Altier MRN: [id number] DOB: 02/18/1956 Sex: female  07/17/2016 Pre-operative Diagnosis: extensive right lower extremity dvt Post-operative diagnosis:  Same Surgeon:  Luanna SalkBrandon C. Randie Heinzain, MD Procedure Performed: 1.  US guided cannulation of right small saphenous vein 2.  Venogram of inferior vena cava 3.  Intravascular ultrasound of right femoral, common femoral, external iliac, common iliac vein and ivc 4.  pharmacomechanical thrombectomy with angiojet of right femoral, common femoral, external iliac, common iliac vein 5.  Balloon angioplasty of right femoral, common femoral with 8mm balloon and external iliac, common iliac veins on right with 12mm balloon   Indications:  61 year old female here with extensive right lower extremity DVT and refractory edema and pain. She had undergone attempted intervention but we could not get access given swelling, depth of vein, clot in vein, and patient discomfort. With further days of heparin she is not improved and is now indicated for the above procedure.  Findings: In the femoral vein and common femoral veins on the right there was apparent acute and chronic-appearing thrombus. The external iliac and common iliac veins on the right had acute-appearing thrombus. Following the above procedure we were able to clear the thrombus from the common femoral vein and form a channel in the external iliac vein. There was some increased channel in the femoral and common femoral system but the chronic component remained. There was no significant stenosis for stenting in the central venous system. I elected to not place a lysis catheter given patient comorbid conditions.   Procedure:  The patient was identified in the holding area and taken to the operating room where she was placed supine and general endotracheal anesthesia was induced. She was then flipped the prone positioning sterilely prepped and draped in her right popliteal fossa usual fashion.  She was given antibiotics and timeout called. Following this we use ultrasound-guided decannulate her small saphenous vein with a micropuncture needle and then placed a marker puncture sheath which had venous return. Using a guidewire we are able to get into the popliteal femoral vein under fluoroscopic guidance and then exchange for 5 French sheath. Using very catheter elderly quick crest catheter we got into the central system and then performed venogram of the IVC which was patent. We then exchange for Amplatz wire into the right innominate vein. We then exchange for 8 French sheath and then performed intravascular ultrasound from the femoral vein all the way through to the vena cava with the above findings of acute and chronic thrombus in that leg and acute clot appearing in the iliac veins above. We then performed final, mechanical thrombectomy first by instilling 10 mg of TPA and letting it sit for 23 minutes within the clot. We then used AngioJet for a total of 242 seconds. We used balloon angioplasty in the thigh with an 8 mm balloon and an iliac veins with 12 mm balloon. Repeat IVUS demonstrated the above findings of having cleared the clot in the common iliac vein on the right and forming a channel in the common femoral and somewhat through the femoral system. Given her underlying comorbid conditions including low platelets, anemia, and acute kidney injury on presentation I elected to not place a lysis catheter. I did not perform any further venography in the presence of clot remaining in the leg. She'll remain on anticoagulation I will wrap her leg with Ace wrap at this time and she will need compression long-term. Given the fact there was no underlying obstructive lesion certainly  am concerned about her underlying medical conditions being the cause. She would be candidate for repeat procedure in the near future should she not have any improvement. Sheath was removed pressure held for 10 minutes which was  hemostatic and Ace bandage applied. Patient tolerated procedure well without immediate complication.  Contrast 5 mL.   Mercury Rock C. Randie Heinz, MD [facility] [place] Office: [phone] Pager: [phone]

## 2016-07-17 NOTE — Anesthesia Procedure Notes (Signed)
Procedure Name: Intubation Date/Time: 07/17/2016 9:13 AM Performed by: Judeth Cornfield T Pre-anesthesia Checklist: Patient identified, Emergency Drugs available, Suction available and Patient being monitored Patient Re-evaluated:Patient Re-evaluated prior to inductionOxygen Delivery Method: Circle System Utilized Preoxygenation: Pre-oxygenation with 100% oxygen Intubation Type: IV induction Ventilation: Mask ventilation without difficulty Laryngoscope Size: Mac and 3 Grade View: Grade I Tube type: Oral Tube size: 7.0 mm Number of attempts: 1 Airway Equipment and Method: Stylet and Oral airway Placement Confirmation: ETT inserted through vocal cords under direct vision,  positive ETCO2 and breath sounds checked- equal and bilateral Secured at: 21 cm Tube secured with: Tape Dental Injury: Teeth and Oropharynx as per pre-operative assessment

## 2016-07-17 NOTE — Anesthesia Postprocedure Evaluation (Addendum)
Anesthesia Post Note  Patient: Nat MathRhonda L Kasparian  Procedure(s) Performed: Procedure(s) (LRB): right ASCENDING VENOGRAM WITH ANGIOJET (Right) PERCUTANEOUS VENOUS THROMBECTOMY AND LYSIS WITH INTRAVASCULAR ULTRASOUND (IVUS) of right lower extermity with balloon angioplasty (Right) ULTRASOUND GUIDANCE FOR VASCULAR ACCESS (Right)  Patient location during evaluation: PACU Anesthesia Type: General Level of consciousness: sedated Pain management: pain level controlled Vital Signs Assessment: post-procedure vital signs reviewed and stable Respiratory status: spontaneous breathing and respiratory function stable Cardiovascular status: stable Anesthetic complications: no       Last Vitals:  Vitals:   07/17/16 1245 07/17/16 1300  BP: 124/65 122/72  Pulse: 77 78  Resp: (!) 22 20  Temp:  36.7 C    Last Pain:  Vitals:   07/17/16 1300  TempSrc:   PainSc: 0-No pain                 Abreanna Drawdy DANIEL

## 2016-07-17 NOTE — Progress Notes (Signed)
Notified Dr Randie Heinzain of need for diet order post op, and to clarify that pt did not need any higher level of care than telemetry. (pt had been told prior to surgery by MD she had stated this am that she would go to ICU post op today)  --pt did have some TPA during procedure, clarification received from Dr Randie Heinzain that pt did not need a higher level of care.

## 2016-07-17 NOTE — Progress Notes (Signed)
RN notified by Eduard Closammy, Rn, at 1600 during shift change that she was instructed that patient's heparin drip was supposed to be off until 9 PM then resumed. RN spoke to pharmacy and they stated that would be within the 6 hour window after patient's surgery and that patient's IV heparin could be resumed at 9 PM at her previous rate of 10.5 mL/hr. Rn informed oncoming night shift nurse of this.

## 2016-07-17 NOTE — Anesthesia Preprocedure Evaluation (Addendum)
Anesthesia Evaluation  Patient identified by MRN, date of birth, ID band Patient awake    Reviewed: Allergy & Precautions, NPO status , Patient's Chart, lab work & pertinent test results  History of Anesthesia Complications Negative for: history of anesthetic complications  Airway Mallampati: III  TM Distance: <3 FB Neck ROM: Limited  Mouth opening: Limited Mouth Opening  Dental no notable dental hx. (+) Dental Advisory Given, Poor Dentition, Loose   Pulmonary sleep apnea ,    Pulmonary exam normal        Cardiovascular hypertension, Pt. on home beta blockers and Pt. on medications Normal cardiovascular exam  Study Conclusions  - Procedure narrative: Transthoracic echocardiography. The study   was technically difficult. - Left ventricle: Wall thickness was increased in a pattern of mild   LVH. Systolic function was normal. The estimated ejection   fraction was in the range of 55% to 60%. - Aortic valve: Valve area (Vmax): 2.35 cm^2. - Left atrium: The atrium was mildly dilated. - Right ventricle: The cavity size was mildly dilated.   Neuro/Psych  Headaches, PSYCHIATRIC DISORDERS Anxiety Depression    GI/Hepatic Neg liver ROS, GERD  ,  Endo/Other  Morbid obesity  Renal/GU negative Renal ROS     Musculoskeletal   Abdominal   Peds  Hematology   Anesthesia Other Findings   Reproductive/Obstetrics                          Anesthesia Physical Anesthesia Plan  ASA: III  Anesthesia Plan: General   Post-op Pain Management:    Induction: Intravenous  Airway Management Planned: Oral ETT  Additional Equipment:   Intra-op Plan:   Post-operative Plan: Extubation in OR  Informed Consent: I have reviewed the patients History and Physical, chart, labs and discussed the procedure including the risks, benefits and alternatives for the proposed anesthesia with the patient or authorized  representative who has indicated his/her understanding and acceptance.   Dental advisory given  Plan Discussed with: CRNA and Anesthesiologist  Anesthesia Plan Comments:        Anesthesia Quick Evaluation

## 2016-07-17 NOTE — Progress Notes (Signed)
ANTICOAGULATION CONSULT NOTE - Follow Up Consult  Pharmacy Consult for Heparin Indication: RLE DVT  Patient Measurements: Height: 5\' 2"  (157.5 cm) Weight: 241 lb 6.4 oz (109.5 kg) IBW/kg (Calculated) : 50.1 Heparin Dosing Weight: 78 kg  Vital Signs: Temp: 98 F (36.7 C) (05/03 1300) Temp Source: Oral (05/03 0517) BP: 122/72 (05/03 1300) Pulse Rate: 78 (05/03 1300)  Labs:  Recent Labs  07/15/16 0224 07/15/16 0946 07/16/16 0437 07/16/16 0920 07/17/16 0207  HGB 7.8*  --  8.0* 8.1*  --   HCT 23.5*  --  23.7* 24.7*  --   PLT 130*  --  135* 128*  --   LABPROT  --   --   --   --  14.3  INR  --   --   --   --  1.10  HEPARINUNFRC 0.22* 0.34 0.43  --  0.47  CREATININE 0.94  --  0.90  --  0.86    Estimated Creatinine Clearance: 80.1 mL/min (by C-G formula based on SCr of 0.86 mg/dL).   Medications: Heparin @ 1050 units/hr  Assessment: 61 yo F with right leg DVT. CTA negative for PE. Originally started on Lovenox and transitioned to Xarelto on 4/25. Received 2 doses of Xarelto, then changed to IV heparin on 4/26 for mechanical thrombectomy on 4/30. This procedure was unsuccessful, heparin was resumed, and plan for repeat intervention on 5/3. Heparin level is therapeutic Hgb low but stable at 8.1.  Goal of Therapy:  Heparin level 0.3-0.7 Monitor platelets by anticoagulation protocol: Yes   Plan:  1) Continue heparin at 1050 units/hr 2) Daily heparin level and CBC  Isaac BlissMichael Avory Rahimi, PharmD, BCPS, BCCCP Clinical Pharmacist Clinical phone for 07/17/2016 from 7a-3:30p: [phone] If after 3:30p, please call main pharmacy at: [phone] 07/17/2016 1:42 PM

## 2016-07-17 NOTE — Progress Notes (Signed)
PROGRESS NOTE    BEAUTY PLESS  JKK:[id number] DOB: 07-15-1955 DOA: 07/08/2016 PCP: No PCP Per Patient   Outpatient Specialists:    Brief Narrative:  Cassandra [name] is a 61 y.o. 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  Dr. Milinda Antis of rheumatology was decreasing the amount of prednisone monthly. This month she decreased from 40 mg prednisone daily to 30.She presented with extensive  right lower extremity DVT . Vascular surgery was consulted for endovascular intervention. Patient failed procedure on 4/30. This procedure will be reattempted 5/3.   Assessment & Plan:   Principal Problem:   AKI (acute kidney injury) ([place]) Active Problems:   GERD (gastroesophageal reflux disease)   Positive ANA (antinuclear antibody)   Right leg swelling   Dehydration   Anemia   Prolonged QT interval   DVT (deep venous thrombosis) ([facility])    Fever with leukopenia Found to have intermittent Low-grade fever    , chest x-ray, UA negative Blood culture from 4/29 negative Continue incentive spirometry Fever Likely due to dvt   DVT of RLE extensive swelling on right leg Vascular surgery consulted-failed endovascular intervention  4/30, repeat attempt at endovascular intervention  Thursday 5/3 with venogram with possible pharmacomechanical thrombectomy, possible overnight lysis Patient has been maintained on a heparin drip since admission Will need to determine long-term anticoagulation    Acute kidney injury on chronic kidney disease stage III: Resolved Urinalysis shows high specific gravity to suggest dehydration.  IVF NS at 75  ml/hr Creatinine down to 0.94     Hypokalemia Repleted   Essential hypertension - Hold lisinopril and furosemide 2/2 AKI  Prolonged QTC: Patient's initial  QTC is noted to be 605 - Avoid further QT prolonging medications. 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 Previously her platelet count has been between 175-243    [name] - protonix  Sinus tachycardia/SVT on 4/26-- resolved with vagal maneuver (blowing in straw)-- added back PRN BB if BP can tolerate, blood pressure soft Klonopin for anxiety   DVT prophylaxis: Heparin drip     Code Status: Full Code   Family Communication:  patient appears to understand plan of care   Disposition Plan: Vascular procedure 5/3, disposition per vascular surgery   Consultants:   vascular     Subjective: Appears comfortable,  afebrile overnight  Objective: Vitals:   07/16/16 0853 07/16/16 1306 07/16/16 2101 07/17/16 0517  BP: 123/60 (!) 123/50 (!) 110/49 (!) 116/54  Pulse: 84 76 65 79  Resp:  (!) _0 Temp: 99.5 F (37.5 C) 98.2 F (36.8 C) 98.4 F (36.9 C) 98.4 F (36.9 C)  TempSrc: Oral Oral Oral Oral  SpO2: 97% 93% 91% 99%  Weight:      Height:        Intake/Output Summary (Last 24 hours) at 07/17/16 0820 Last data filed at 07/16/16 1830  Gross per 24 hour  Intake              600 ml  Output             1500 ml  Net             -900 ml   Filed Weights   07/08/16  1847 07/09/16 0140 07/10/16 0431  Weight: 107.5 kg (237 lb) 106.3 kg (234 lb 6.4 oz) 109.5 kg (241 lb 6.4 oz)    Examination:  General exam: in bed, eating breakfast Respiratory system: Clear to auscultation. Respiratory effort normal. Cardiovascular system: S1 & S2 heard, RRR. No JVD, murmurs, rubs, gallops or clicks.. Gastrointestinal system: Abdomen is nondistended, soft and nontender. No organomegaly or masses felt. Normal bowel sounds heard. Central nervous system: Alert and oriented. No focal neurological deficits. Right leg swollen, pulses palpable, + edema but warmth same as other leg    Data Reviewed: I have personally reviewed following labs and imaging studies  CBC:  Recent Labs Lab 07/13/16 0400 07/14/16 0228 07/15/16 0224 07/16/16 0437 07/16/16 0920  WBC 2.5* 2.4* 1.5* 1.8* 2.2*  NEUTROABS  --   --   --  1.6* 1.9  HGB 8.5* 8.2* 7.8* 8.0* 8.1*  HCT 24.6* 24.3* 23.5* 23.7* 24.7*  MCV 100.4* 101.3* 102.2* 101.3* 101.2*  PLT 124* 135* 130* 135* 081*   Basic Metabolic Panel:  Recent Labs Lab 07/12/16 0338 07/14/16 0228 07/15/16 0224 07/15/16 1102 07/16/16 0437 07/17/16 0207  NA 138 139 138  --  138 136  K 3.9 3.8 3.2*  --  3.8 4.0  CL 109 110 108  --  106 104  CO2 _0 --  24 24  GLUCOSE 121* 109* 123*  --  99 113*  BUN _1 --  7 9  CREATININE 0.87 0.94 0.94  --  0.90 0.86  CALCIUM 8.5* 8.5* 8.2*  --  8.5* 8.6*  MG  --   --   --  1.6*  --   --    GFR: Estimated Creatinine Clearance: 80.1 mL/min (by C-G formula based on SCr of 0.86 mg/dL). Liver Function Tests:  Recent Labs Lab 07/12/16 0338 07/14/16 0228 07/16/16 0437  AST 36 24 28  ALT 33 30 27  ALKPHOS 56 49 47  BILITOT 0.7 0.4 0.5  PROT 5.3* 4.9* 4.8*  ALBUMIN 2.9* 2.7* 2.5*   No results for input(s): LIPASE, AMYLASE in the last 168 hours. No results for input(s): AMMONIA in the last 168 hours. Coagulation Profile:  Recent Labs Lab 07/17/16 0207  INR 1.10   Cardiac  Enzymes: No results for input(s): CKTOTAL, CKMB, CKMBINDEX, TROPONINI in the last 168 hours. BNP (last 3 results) No results for input(s): PROBNP in the last 8760 hours. HbA1C: No results for input(s): HGBA1C in the last 72 hours. CBG: No results for input(s): GLUCAP in the last 168 hours. Lipid Profile: No results for input(s): CHOL, HDL, LDLCALC, TRIG, CHOLHDL, LDLDIRECT in the last 72 hours. Thyroid Function Tests: No results for input(s): TSH, T4TOTAL, FREET4, T3FREE, THYROIDAB in the last 72 hours. Anemia Panel: No results for input(s): VITAMINB12, FOLATE, FERRITIN, TIBC, IRON, RETICCTPCT in the last 72 hours. Urine analysis:    Component Value Date/Time   COLORURINE COLORLESS (  A) 07/13/2016 1651   APPEARANCEUR CLEAR 07/13/2016 1651   LABSPEC 1.002 (L) 07/13/2016 1651   PHURINE 6.0 07/13/2016 1651   GLUCOSEU NEGATIVE 07/13/2016 1651   GLUCOSEU NEGATIVE 11/30/2008 1522   HGBUR SMALL (A) 07/13/2016 1651   BILIRUBINUR NEGATIVE 07/13/2016 1651   KETONESUR NEGATIVE 07/13/2016 1651   PROTEINUR NEGATIVE 07/13/2016 1651   UROBILINOGEN 1 06/02/2013 1524   NITRITE NEGATIVE 07/13/2016 1651   LEUKOCYTESUR NEGATIVE 07/13/2016 1651     ) Recent Results (from the past 240 hour(s))  Culture, blood (routine x 2)     Status: None (Preliminary result)   Collection Time: 07/13/16 10:47 AM  Result Value Ref Range Status   Specimen Description BLOOD BLOOD RIGHT HAND  Final   Special Requests IN PEDIATRIC BOTTLE Blood Culture adequate volume  Final   Culture NO GROWTH 3 DAYS  Final   Report Status PENDING  Incomplete  Culture, blood (routine x 2)     Status: None (Preliminary result)   Collection Time: 07/13/16 10:55 AM  Result Value Ref Range Status   Specimen Description BLOOD BLOOD LEFT HAND  Final   Special Requests IN PEDIATRIC BOTTLE Blood Culture adequate volume  Final   Culture NO GROWTH 3 DAYS  Final   Report Status PENDING  Incomplete      Anti-infectives    Start      Dose/Rate Route Frequency Ordered Stop   07/14/16 0900  ceFAZolin (ANCEF) IVPB 2g/100 mL premix  Status:  Discontinued    Comments:  Send with pt to OR I am aware of her Ampicillin allergy thanks   2 g 200 mL/hr over 30 Minutes Intravenous To Short Stay 07/14/16 0528 07/14/16 1721       Radiology Studies: No results found.      Scheduled Meds: . [MAR Hold] darifenacin  7.5 mg Oral Daily  . [MAR Hold] metoprolol tartrate  25 mg Oral BID  . [MAR Hold] pantoprazole  40 mg Oral Daily  . [MAR Hold] predniSONE  30 mg Oral Daily  . [MAR Hold] sertraline  100 mg Oral QHS   Continuous Infusions: . sodium chloride 75 mL/hr at 07/15/16 2345  . [MAR Hold] sodium chloride    . heparin 1,050 Units/hr (07/16/16 1939)     LOS: 7 days    Time spent: 25 min    Reyne Dumas, MD [facility] Pager [phone]  If 7PM-7AM, please contact night-coverage [email or web address] Password [email or web address] 07/17/2016, 8:20 AM

## 2016-07-17 NOTE — Progress Notes (Signed)
PT Cancellation Note  Patient Details Name: Cassandra MathRhonda L [name] MRN: [id number] DOB: June 20, 1955   Cancelled Treatment:    Reason Eval/Treat Not Completed: Other (comment) (post op . will start tomorrow.)   Sharen HeckHill, Mariajose Mow Elizabeth Junior Kenedy PT [phone]  07/17/2016, 3:16 PM

## 2016-07-17 NOTE — Progress Notes (Signed)
  Progress Note    07/17/2016 8:20 AM Day of Surgery  Subjective:  No new issues this a.m.  Vitals:   07/16/16 2101 07/17/16 0517  BP: (!) 110/49 (!) 116/54  Pulse: 65 79  Resp: 18 18  Temp: 98.4 F (36.9 C) 98.4 F (36.9 C)    Physical Exam: aaox3 Abdomen is soft R leg has non-pitting edema throughout   CBC    Component Value Date/Time   WBC 2.2 (L) 07/16/2016 0920   RBC 2.44 (L) 07/16/2016 0920   HGB 8.1 (L) 07/16/2016 0920   HCT 24.7 (L) 07/16/2016 0920   PLT 128 (L) 07/16/2016 0920   MCV 101.2 (H) 07/16/2016 0920   MCH 33.2 07/16/2016 0920   MCHC 32.8 07/16/2016 0920   RDW 15.0 07/16/2016 0920   LYMPHSABS 0.1 (L) 07/16/2016 0920   MONOABS 0.1 07/16/2016 0920   EOSABS 0.0 07/16/2016 0920   BASOSABS 0.0 07/16/2016 0920    BMET    Component Value Date/Time   NA 136 07/17/2016 0207   K 4.0 07/17/2016 0207   CL 104 07/17/2016 0207   CO2 24 07/17/2016 0207   GLUCOSE 113 (H) 07/17/2016 0207   BUN 9 07/17/2016 0207   CREATININE 0.86 07/17/2016 0207   CREATININE 0.76 06/02/2013 1510   CALCIUM 8.6 (L) 07/17/2016 0207   GFRNONAA >60 07/17/2016 0207   GFRNONAA 87 06/02/2013 1510   GFRAA >60 07/17/2016 0207   GFRAA >89 06/02/2013 1510    INR    Component Value Date/Time   INR 1.10 07/17/2016 0207     Intake/Output Summary (Last 24 hours) at 07/17/16 0820 Last data filed at 07/16/16 1830  Gross per 24 hour  Intake              600 ml  Output             1500 ml  Net             -900 ml     Assessment:  61 y.o. female is here with extensive rle dvt  Plan: I have again discussed the risks of bleeding, injury at the stick site and renal dysfunction. Consent signed and will proceed with rle venogram with possible pharmacomechanical thrombectomy, possible overnight lysis, ivus, possible stenting. If access cannot be obtained from popliteal vein then will attempt supine access of right femoral vein.   Britnie Colville C. Randie Heinzain, MD [facility] [place] Office: [phone] Pager: [phone]  07/17/2016 8:20 AM

## 2016-07-17 NOTE — OR Nursing (Signed)
Run time for angiojet was 242 seconds.

## 2016-07-18 ENCOUNTER — Inpatient Hospital Stay (HOSPITAL_COMMUNITY): Payer: Medicare HMO

## 2016-07-18 ENCOUNTER — Encounter (HOSPITAL_COMMUNITY): Payer: Self-pay | Admitting: Vascular Surgery

## 2016-07-18 ENCOUNTER — Other Ambulatory Visit: Payer: Self-pay | Admitting: *Deleted

## 2016-07-18 ENCOUNTER — Telehealth: Payer: Self-pay | Admitting: Vascular Surgery

## 2016-07-18 DIAGNOSIS — I82402 Acute embolism and thrombosis of unspecified deep veins of left lower extremity: Secondary | ICD-10-CM

## 2016-07-18 DIAGNOSIS — Z9862 Peripheral vascular angioplasty status: Secondary | ICD-10-CM

## 2016-07-18 LAB — BASIC METABOLIC PANEL
ANION GAP: 6 (ref 5–15)
BUN: 9 mg/dL (ref 6–20)
CHLORIDE: 107 mmol/L (ref 101–111)
CO2: 26 mmol/L (ref 22–32)
Calcium: 8.1 mg/dL — ABNORMAL LOW (ref 8.9–10.3)
Creatinine, Ser: 0.84 mg/dL (ref 0.44–1.00)
GFR calc Af Amer: >60 mL/min (ref >60)
Glucose, Bld: 118 mg/dL — ABNORMAL HIGH (ref 65–99)
POTASSIUM: 3.9 mmol/L (ref 3.5–5.1)
SODIUM: 139 mmol/L (ref 135–145)

## 2016-07-18 LAB — CBC WITH DIFFERENTIAL/PLATELET
BASOS ABS: 0 10*3/uL (ref 0.0–0.1)
Basophils Relative: 0 %
EOS ABS: 0 10*3/uL (ref 0.0–0.7)
Eosinophils Relative: 1 %
HCT: 21.6 % — ABNORMAL LOW (ref 36.0–46.0)
HEMOGLOBIN: 7 g/dL — AB (ref 12.0–15.0)
LYMPHS ABS: 0.1 10*3/uL — AB (ref 0.7–4.0)
LYMPHS PCT: 6 %
MCH: 33.5 pg (ref 26.0–34.0)
MCHC: 32.4 g/dL (ref 30.0–36.0)
MCV: 103.3 fL — AB (ref 78.0–100.0)
Monocytes Absolute: 0.1 10*3/uL (ref 0.1–1.0)
Monocytes Relative: 5 %
NEUTROS PCT: 88 %
Neutro Abs: 1.9 10*3/uL (ref 1.7–7.7)
Platelets: 125 10*3/uL — ABNORMAL LOW (ref 150–400)
RBC: 2.09 MIL/uL — AB (ref 3.87–5.11)
RDW: 15.2 % (ref 11.5–15.5)
WBC: 2.2 10*3/uL — AB (ref 4.0–10.5)

## 2016-07-18 LAB — CULTURE, BLOOD (ROUTINE X 2)
CULTURE: NO GROWTH
Culture: NO GROWTH
SPECIAL REQUESTS: ADEQUATE
Special Requests: ADEQUATE

## 2016-07-18 LAB — HEPARIN LEVEL (UNFRACTIONATED): HEPARIN UNFRACTIONATED: 0.24 [IU]/mL — AB (ref 0.30–0.70)

## 2016-07-18 LAB — PREPARE RBC (CROSSMATCH)

## 2016-07-18 MED ORDER — CALCIUM CARBONATE ANTACID 500 MG PO CHEW
1.0000 | CHEWABLE_TABLET | Freq: Four times a day (QID) | ORAL | Status: DC | PRN
  Administered 2016-07-18: 200 mg via ORAL
  Filled 2016-07-18: qty 1

## 2016-07-18 MED ORDER — ALUM & MAG HYDROXIDE-SIMETH 200-200-20 MG/5ML PO SUSP
30.0000 mL | Freq: Four times a day (QID) | ORAL | Status: DC | PRN
  Administered 2016-07-18: 30 mL via ORAL
  Filled 2016-07-18: qty 30

## 2016-07-18 MED ORDER — SODIUM CHLORIDE 0.9 % IV SOLN: Freq: Once | INTRAVENOUS | Status: DC

## 2016-07-18 MED ORDER — RIVAROXABAN 15 MG PO TABS
15.0000 mg | ORAL_TABLET | Freq: Once | ORAL | Status: AC
  Administered 2016-07-18: 15 mg via ORAL
  Filled 2016-07-18: qty 1

## 2016-07-18 NOTE — Progress Notes (Addendum)
[facility] [place]  Subjective  - Leg feels better.   Objective (!) 101/57 85 98.5 F (36.9 C) (Oral) 18 95%  Intake/Output Summary (Last 24 hours) at 07/18/16 0825 Last data filed at 07/18/16 0554  Gross per 24 hour  Intake           4347.5 ml  Output             4760 ml  Net           -412.5 ml    Decreased edema in the right LE Palpable PT right LE Lungs non labored breathing Heart RRR   Assessment/Planning: POD # 1 Surgeon:  Luanna SalkBrandon C. Randie Heinzain, MD Procedure Performed: 1.  US guided cannulation of right small saphenous vein 2.  Venogram of inferior vena cava 3.  Intravascular ultrasound of right femoral, common femoral, external iliac, common iliac vein and ivc 4.  pharmacomechanical thrombectomy with angiojet of right femoral, common femoral, external iliac, common iliac vein 5.  Balloon angioplasty of right femoral, common femoral with 8mm balloon and external iliac, common iliac veins on right with 12mm balloon  Currently on Heparin Discharge on anticoagulation F/U with Dr. Randie Heinzain 4-6 weeks will need IVC iliac duplex   Thomasena EdisCOLLINS, [facility] HospitalEMMA [facility] Hospital[facility]UREEN 07/18/2016 8:25 AM --  Laboratory Lab Results:  Recent Labs  07/16/16 0920 07/18/16 0218  WBC 2.2* 2.2*  HGB 8.1* 7.0*  HCT 24.7* 21.6*  PLT 128* 125*   BMET  Recent Labs  07/17/16 0207 07/18/16 0218  NA 136 139  K 4.0 3.9  CL 104 107  CO2 24 26  GLUCOSE 113* 118*  BUN 9 9  CREATININE 0.86 0.84  CALCIUM 8.6* 8.1*    COAG Lab Results  Component Value Date   INR 1.10 07/17/2016   INR 1.16 07/09/2016   INR 1.03 03/18/2016   No results found for: PTT   I have interviewed patient with PA and agree with assessment and plan above. Ok for transition to oral anticoagulation. Ted hose to right leg while here then will need at least otc compression stockings as outpatient. Will f/u in 4-6 weeks with ivc/iliac duplex. Ok for activity as tolerated. Will need baby aspirin if she can  tolerate.   Synda Bagent C. Randie Heinzain, MD [facility] [place] Office: [phone] Pager: [phone]

## 2016-07-18 NOTE — Care Management Note (Signed)
Case Management Note  Patient Details  Name: Cassandra [name] MRN: [id number] Date of Birth: [birth year]  Subjective/Objective:  Pt presented for AKI. Pt is from home with sister. Plan to return to parents home once stable. Pt will need order for HHPT and F2F once stable for d/c.                   Action/Plan: Agency List provided and pt chose [facility]. [facility] to begin within 24-48 hours post d/c. No DME needs at this time. Pt has Xarelto card and co pay given to pat at $14.10. No further needs at this time.   Expected Discharge Date:                  Expected Discharge Plan:  Home w Home Health Services  In-House Referral:  NA  Discharge planning Services  CM Consult, Medication Assistance  Post Acute Care Choice:  Home Health Choice offered to:  Patient  DME Arranged:  N/A DME Agency:  NA  HH Arranged:  PT [facility]  Status of Service:  Completed, signed off  If discussed at Long Length of Stay Meetings, dates discussed:    Additional Comments:  Gala LewandowskyGraves-Bigelow, Maliha Outten Kaye, RN 07/18/2016, 4:16 PM

## 2016-07-18 NOTE — Progress Notes (Signed)
PROGRESS NOTE    Cassandra [name]  PPJ:[id number] DOB: 11/12/1955 DOA: 07/08/2016 PCP: Patient, No Pcp Per   Outpatient Specialists:    Brief Narrative:  Cassandra [name] is a 61 y.o. 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  Dr. Milinda Antis of rheumatology was decreasing the amount of prednisone monthly. This month she decreased from 40 mg prednisone daily to 30.She presented with extensive  right lower extremity DVT . Vascular surgery was consulted for endovascular intervention. Patient failed procedure on 4/30. This procedure  reattempted 5/3.   Assessment & Plan:   Principal Problem:   AKI (acute kidney injury) ([place]) Active Problems:   GERD (gastroesophageal reflux disease)   Positive ANA (antinuclear antibody)   Right leg swelling   Dehydration   Anemia   Prolonged QT interval   DVT (deep venous thrombosis) (HCC)    Fever with leukopenia Found to have intermittent Low-grade fever    , chest x-ray, UA negative Blood culture from 4/29 negative Continue incentive spirometry Fever Likely due to dvt .  Currently afebrile.   DVT of RLE extensive swelling on right leg Vascular surgery consulted-failed endovascular intervention  4/30, repeat attempt at endovascular intervention  Thursday 5/3 with venogram with possible pharmacomechanical thrombectomy, possible overnight lysis.  Patient has been maintained on a heparin drip since admission changed to xarelto, plan for discharge in 1 day. .     Acute kidney injury on chronic kidney disease stage III: Resolved Urinalysis shows high specific gravity to suggest dehydration.  Creatinine down to 0.94, stop IV fluids.      Hypokalemia Repleted   Essential hypertension  well controlled.  - Hold lisinopril and furosemide 2/2  AKI  Prolonged QTC: Patient's initial QTC is noted to be 605 - Avoid further QT prolonging medications. - improved on AM EKG to < 450 - no new changes.   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 Today hemoglobin is 7. 1 unit of prbc transfusion will be ordered.  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   Thrombocytopenia:   Improving, now 134 k >124k>135 K >128k>125  No previous history of low platelets counts previously noted in the past. Previously her platelet count has been between 175-243    Cassandra [name] - protonix  Sinus tachycardia/SVT on 4/26-- resolved with vagal maneuver (blowing in straw)-- added back PRN BB if BP can tolerate, blood pressure soft Klonopin for anxiety   Hypoxia of unclear etiology. CXR does not show any edema or consolidation.  ? PE. Currently requiring 2 to 3 lit of [place] OXYGEN  At rest.  Transfuse and recheck sats in am.   DVT prophylaxis: xarelto.     Code Status: Full Code   Family Communication:  patient appears to understand plan of care   Disposition Plan: Vascular procedure 5/3, plan for DC in am, if hypoxia improves   Consultants:  vascular     Subjective: Appears comfortable,  afebrile overnight, but still requiring upto 3 lit of [place] oxygen.   Objective: Vitals:   07/17/16 2104 07/18/16 0949 07/18/16 1009 07/18/16 1545  BP: (!) 101/57 (!) 100/44    Pulse: 85 81 90   Resp: 18     Temp: 98.5 F (36.9 C)     TempSrc:  Oral     SpO2: 95% 100% 93% (!) 84%  Weight:      Height:        Intake/Output Summary (Last 24 hours) at 07/18/16 1614 Last data filed at 07/18/16 1024  Gross per 24 hour  Intake          2112.92 ml  Output             4450 ml  Net         -2337.08 ml   Filed Weights   07/08/16 1847 07/09/16 0140 07/10/16 0431  Weight: 107.5 kg (237 lb) 106.3 kg (234 lb 6.4 oz) 109.5 kg (241 lb 6.4 oz)    Examination:  General exam: in bed, comfortable.  Respiratory system: Clear to auscultation. Respiratory effort normal. Cardiovascular system: S1 & S2 heard, RRR. No JVD, murmurs, rubs, gallops or clicks.. Gastrointestinal system: Abdomen is nondistended, soft and nontender. No organomegaly or masses felt. Normal bowel sounds heard. Central nervous system: Alert and oriented. No focal neurological deficits. Right leg swollen, pulses palpable, + edema but warmth same as other leg    Data Reviewed: I have personally reviewed following labs and imaging studies  CBC:  Recent Labs Lab 07/14/16 0228 07/15/16 0224 07/16/16 0437 07/16/16 0920 07/18/16 0218  WBC 2.4* 1.5* 1.8* 2.2* 2.2*  NEUTROABS  --   --  1.6* 1.9 1.9  HGB 8.2* 7.8* 8.0* 8.1* 7.0*  HCT 24.3* 23.5* 23.7* 24.7* 21.6*  MCV 101.3* 102.2* 101.3* 101.2* 103.3*  PLT 135* 130* 135* 128* 758*   Basic Metabolic Panel:  Recent Labs Lab 07/14/16 0228 07/15/16 0224 07/15/16 1102 07/16/16 0437 07/17/16 0207 07/18/16 0218  NA 139 138  --  138 136 139  K 3.8 3.2*  --  3.8 4.0 3.9  CL 110 108  --  106 104 107  CO2 22 25  --  '24 24 26  '$ GLUCOSE 109* 123*  --  99 113* 118*  BUN 9 8  --  '7 9 9  '$ CREATININE 0.94 0.94  --  0.90 0.86 0.84  CALCIUM 8.5* 8.2*  --  8.5* 8.6* 8.1*  MG  --   --  1.6*  --   --   --    GFR: Estimated Creatinine Clearance: 82.1 mL/min (by C-G formula based on SCr of 0.84 mg/dL). Liver Function Tests:  Recent Labs Lab 07/12/16 0338 07/14/16 0228 07/16/16 0437  AST 36 24 28  ALT 33 30 27   ALKPHOS 56 49 47  BILITOT 0.7 0.4 0.5  PROT 5.3* 4.9* 4.8*  ALBUMIN 2.9* 2.7* 2.5*   No results for input(s): LIPASE, AMYLASE in the last 168 hours. No results for input(s): AMMONIA in the last 168 hours. Coagulation Profile:  Recent Labs Lab 07/17/16 0207  INR 1.10   Cardiac Enzymes: No results for input(s): CKTOTAL, CKMB, CKMBINDEX, TROPONINI in the last 168 hours. BNP (last 3 results) No results for input(s): PROBNP in the last 8760 hours. HbA1C: No results for input(s): HGBA1C in the last 72 hours. CBG: No results for input(s): GLUCAP in the last 168 hours. Lipid Profile:  No results for input(s): CHOL, HDL, LDLCALC, TRIG, CHOLHDL, LDLDIRECT in the last 72 hours. Thyroid Function Tests: No results for input(s): TSH, T4TOTAL, FREET4, T3FREE, THYROIDAB in the last 72 hours. Anemia Panel: No results for input(s): VITAMINB12, FOLATE, FERRITIN, TIBC, IRON, RETICCTPCT in the last 72 hours. Urine analysis:    Component Value Date/Time   COLORURINE COLORLESS (A) 07/13/2016 1651   APPEARANCEUR CLEAR 07/13/2016 1651   LABSPEC 1.002 (L) 07/13/2016 1651   PHURINE 6.0 07/13/2016 1651   GLUCOSEU NEGATIVE 07/13/2016 1651   GLUCOSEU NEGATIVE 11/30/2008 1522   HGBUR SMALL (A) 07/13/2016 1651   BILIRUBINUR NEGATIVE 07/13/2016 1651   KETONESUR NEGATIVE 07/13/2016 1651   PROTEINUR NEGATIVE 07/13/2016 1651   UROBILINOGEN 1 06/02/2013 1524   NITRITE NEGATIVE 07/13/2016 1651   LEUKOCYTESUR NEGATIVE 07/13/2016 1651     ) Recent Results (from the past 240 hour(s))  Culture, blood (routine x 2)     Status: None   Collection Time: 07/13/16 10:47 AM  Result Value Ref Range Status   Specimen Description BLOOD BLOOD RIGHT HAND  Final   Special Requests IN PEDIATRIC BOTTLE Blood Culture adequate volume  Final   Culture NO GROWTH 5 DAYS  Final   Report Status 07/18/2016 FINAL  Final  Culture, blood (routine x 2)     Status: None   Collection Time: 07/13/16 10:55 AM  Result Value Ref  Range Status   Specimen Description BLOOD BLOOD LEFT HAND  Final   Special Requests IN PEDIATRIC BOTTLE Blood Culture adequate volume  Final   Culture NO GROWTH 5 DAYS  Final   Report Status 07/18/2016 FINAL  Final      Anti-infectives    Start     Dose/Rate Route Frequency Ordered Stop   07/17/16 1000  vancomycin (VANCOCIN) 1,000 mg in sodium chloride 0.9 % 250 mL IVPB  Status:  Discontinued     1,000 mg 250 mL/hr over 60 Minutes Intravenous Every 12 hours 07/17/16 0949 07/17/16 1329   07/17/16 0852  vancomycin (VANCOCIN) 1-5 GM/200ML-% IVPB    Comments:  Precious Haws   : cabinet override      07/17/16 0852 07/17/16 2059   07/14/16 0900  ceFAZolin (ANCEF) IVPB 2g/100 mL premix  Status:  Discontinued    Comments:  Send with pt to OR I am aware of her Ampicillin allergy thanks   2 g 200 mL/hr over 30 Minutes Intravenous To Short Stay 07/14/16 0528 07/14/16 1721       Radiology Studies: Dg Chest 2 View  Result Date: 07/18/2016 CLINICAL DATA:  Shortness of breath for several days EXAM: CHEST  2 VIEW COMPARISON:  July 13, 2016 FINDINGS: There is no edema or consolidation. There is cardiomegaly with pulmonary vascularity within normal limits. No adenopathy. There is mild degenerative change in the thoracic spine. IMPRESSION: Cardiomegaly.  No edema or consolidation. Electronically Signed   By: Lowella Grip III M.D.   On: 07/18/2016 10:53        Scheduled Meds: . darifenacin  7.5 mg Oral Daily  . metoprolol tartrate  25 mg Oral BID  . pantoprazole  40 mg Oral Daily  . predniSONE  30 mg Oral Daily  . sertraline  100 mg Oral QHS   Continuous Infusions: . sodium chloride 100 mL/hr at 07/18/16 0554     LOS: 8 days    Time spent: 25 min    Joanna Borawski, MD [facility] Pager [phone]  If 7PM-7AM, please contact night-coverage [email or web address] Password [email or web address] 07/18/2016, 4:14  PM

## 2016-07-18 NOTE — Telephone Encounter (Signed)
Sched lab 08/28/16 at 8:30 and MD 08/29/16 at 8:30. Lm on hm# for pt to confirm appts.

## 2016-07-18 NOTE — Evaluation (Signed)
Physical Therapy Evaluation Patient Details Name: Cassandra MathRhonda L Boyden MRN: [id number] DOB: 24-Apr-1955 Today's Date: 07/18/2016   History of Present Illness  61 yo admitted with RLE DVT which failed heparin and s/p angioplasty and thrombectomy 5/3. PMHx: anxiety, SVT, Wegner's dz, R TKA, renal insufficiency  Clinical Impression  Pt very pleasant and reports discomfort from catheter. Pt very willing to walk and mobilize and reports no dizziness however became SOB with gait and O2 sats noted to drop to 80% on RA with seated rest and supplemental O2 at 4L required to recover sats to mid 90s. Pt with sats 88% on 2L with return walk to room. Pt with decreased activity tolerance, balance, gait, cardiopulmonary function who will benefit from acute therapy to maximize mobility and safety.  HR 90 at rest with rise to 122 with gait sats 93% at rest on 2L end of session     Follow Up Recommendations Home health PT    Equipment Recommendations  None recommended by PT    Recommendations for Other Services OT consult     Precautions / Restrictions Precautions Precautions: Fall Precaution Comments: watch sats      Mobility  Bed Mobility Overal bed mobility: Modified Independent             General bed mobility comments: with use of rail and HOb 20 degrees  Transfers Overall transfer level: Needs assistance   Transfers: Sit to/from Stand Sit to Stand: Supervision         General transfer comment: supervision for safety and lines  Ambulation/Gait Ambulation/Gait assistance: Min guard Ambulation Distance (Feet): 90 Feet Assistive device: Rolling walker (2 wheeled) Gait Pattern/deviations: Step-through pattern;Decreased stride length;Trunk flexed   Gait velocity interpretation: Below normal speed for age/gender General Gait Details: pt initiated gait without RW but unsteady and reported feeling weak. Utilized RW and pt with increased WOB after 90' checked O2 sats which were 80%. Pt sat  and required 4L to recover to 95%, Returned to room 90' on 2L with sats dropping to 88%  Stairs            Wheelchair Mobility    Modified Rankin (Stroke Patients Only)       Balance Overall balance assessment: Needs assistance   Sitting balance-Leahy Scale: Good       Standing balance-Leahy Scale: Fair                               Pertinent Vitals/Pain Pain Assessment: No/denies pain    Home Living Family/patient expects to be discharged to:: Private residence Living Arrangements: Other relatives Available Help at Discharge: Family;Available PRN/intermittently Type of Home: Mobile home Home Access: Stairs to enter Entrance Stairs-Rails: Right;Left;Can reach both Entrance Stairs-Number of Steps: 3 Home Layout: One level Home Equipment: Environmental consultantWalker - 2 wheels Additional Comments: pt lives with sister who works    Prior Function Level of Independence: Independent               Higher education careers adviserHand Dominance        Extremity/Trunk Assessment   Upper Extremity Assessment Upper Extremity Assessment: Generalized weakness    Lower Extremity Assessment Lower Extremity Assessment: Generalized weakness    Cervical / Trunk Assessment Cervical / Trunk Assessment: Kyphotic  Communication   Communication: HOH  Cognition Arousal/Alertness: Awake/alert Behavior During Therapy: WFL for tasks assessed/performed Overall Cognitive Status: Within Functional Limits for tasks assessed  General Comments      Exercises     Assessment/Plan    PT Assessment Patient needs continued PT services  PT Problem List Decreased strength;Decreased mobility;Decreased activity tolerance;Decreased knowledge of use of DME;Cardiopulmonary status limiting activity       PT Treatment Interventions DME instruction;Gait training;Therapeutic exercise;Patient/family education;Therapeutic activities;Functional mobility  training;Stair training    PT Goals (Current goals can be found in the Care Plan section)  Acute Rehab PT Goals Patient Stated Goal: return home PT Goal Formulation: With patient Time For Goal Achievement: 08/01/16 Potential to Achieve Goals: Good    Frequency Min 3X/week   Barriers to discharge Decreased caregiver support lives with sister who works    Co-evaluation               AM-PAC PT "6 Clicks" Daily Activity  Outcome Measure Difficulty turning over in bed (including adjusting bedclothes, sheets and blankets)?: A Lot Difficulty moving from lying on back to sitting on the side of the bed? : A Lot Difficulty sitting down on and standing up from a chair with arms (e.g., wheelchair, bedside commode, etc,.)?: A Little Help needed moving to and from a bed to chair (including a wheelchair)?: None Help needed walking in hospital room?: A Little Help needed climbing 3-5 steps with a railing? : A Little 6 Click Score: 17    End of Session Equipment Utilized During Treatment: Gait belt;Oxygen Activity Tolerance: Patient tolerated treatment well Patient left: in chair;with call bell/phone within reach;with chair alarm set Nurse Communication: Mobility status;Other (comment) (oxygen requirement) PT Visit Diagnosis: Difficulty in walking, not elsewhere classified (R26.2)    Time: 1610-9604 PT Time Calculation (min) (ACUTE ONLY): 28 min   Charges:   PT Evaluation $PT Eval Moderate Complexity: 1 Procedure PT Treatments $Gait Training: 8-22 mins   PT G Codes:        [name], PT [phone]  Kadeidra Coryell B Charidy Cappelletti 07/18/2016, 10:20 AM

## 2016-07-18 NOTE — Telephone Encounter (Signed)
-----   Message from Sharee PimpleMarilyn K McChesney, RN sent at 07/18/2016 10:19 AM EDT ----- Regarding: [facility]   ----- Message ----- From: Lars MageEmma M Collins, PA-C Sent: 07/18/2016   8:34 AM To: Vvs Charge Pool  F/U with Dr. Randie Heinzain 2-3 weeks will need IVC iliac duplex s/p thrombolysis of right LE.

## 2016-07-18 NOTE — Progress Notes (Signed)
SATURATION QUALIFICATIONS: (This note is used to comply with regulatory documentation for home oxygen)  Patient Saturations on Room Air at Rest = 90%  Patient Saturations on Room Air while Ambulating = 80%  Patient Saturations on 2 Liters of oxygen while Ambulating = 88%  Please briefly explain why patient needs home oxygen:pt with desaturation with ambulation on RA.  Delaney MeigsMaija Tabor Awanda Wilcock, PT [phone]

## 2016-07-18 NOTE — Care Management Important Message (Signed)
Important Message  Patient Details  Name: Nat MathRhonda L Ellefson MRN: [id number] Date of Birth: [birth year]   Medicare Important Message Given:  Yes    Kyla BalzarineShealy, Inman Fettig Abena 07/18/2016, 12:29 PM

## 2016-07-18 NOTE — Progress Notes (Signed)
ANTICOAGULATION CONSULT NOTE - Follow Up Consult  Pharmacy Consult for Heparin Indication: RLE DVT  Patient Measurements: Height: 5\' 2"  (157.5 cm) Weight: 241 lb 6.4 oz (109.5 kg) IBW/kg (Calculated) : 50.1 Heparin Dosing Weight: 78 kg  Vital Signs: Temp: 98.5 F (36.9 C) (05/03 2104) Temp Source: Oral (05/03 2104) BP: 101/57 (05/03 2104) Pulse Rate: 85 (05/03 2104)  Labs:  Recent Labs  07/16/16 0437 07/16/16 0920 07/17/16 0207 07/18/16 0218  HGB 8.0* 8.1*  --  7.0*  HCT 23.7* 24.7*  --  21.6*  PLT 135* 128*  --  125*  LABPROT  --   --  14.3  --   INR  --   --  1.10  --   HEPARINUNFRC 0.43  --  0.47 0.24*  CREATININE 0.90  --  0.86  --     Estimated Creatinine Clearance: 80.1 mL/min (by C-G formula based on SCr of 0.86 mg/dL).   Assessment: 61 yo F with right leg DVT. CTA negative for PE. Originally started on Lovenox and transitioned to Xarelto on 4/25. Received 2 doses of Xarelto, then changed to IV heparin on 4/26 for mechanical thrombectomy on 4/30. This procedure was unsuccessful, s/p repeat thrombectomy 5/3 - heparin resumed post-op. Heparin level subtherapeutic (0.24) on gtt at 1050 units/hr. Hgb down to 7. No issues with line or bleeding reported per RN.   Goal of Therapy:  Heparin level 0.3-0.7 units/ml Monitor platelets by anticoagulation protocol: Yes   Plan:  Increase heparin to 1200 units/hr Will f/u 6 hr heparin level  Christoper Fabianaron Mansel Strother, PharmD, BCPS Clinical pharmacist, pager [phone] 07/18/2016 3:10 AM

## 2016-07-18 NOTE — Progress Notes (Signed)
Pt still having c/o heartburn. MD notified. New orders placed. Will continue to monitor. Gregor HamsAlisha Terion Hedman, RN

## 2016-07-18 NOTE — Progress Notes (Signed)
Pt having c/o of heartburn rating 9/10. MD notified. New orders placed. Will continue to monitor. Gregor HamsAlisha Cashis Rill, RN

## 2016-07-19 ENCOUNTER — Encounter (HOSPITAL_COMMUNITY): Payer: Self-pay | Admitting: *Deleted

## 2016-07-19 ENCOUNTER — Inpatient Hospital Stay (HOSPITAL_COMMUNITY): Payer: Medicare HMO

## 2016-07-19 DIAGNOSIS — R0902 Hypoxemia: Secondary | ICD-10-CM

## 2016-07-19 LAB — BPAM RBC
Blood Product Expiration Date: 201805072359
ISSUE DATE / TIME: 201805041641
Unit Type and Rh: 9500

## 2016-07-19 LAB — TYPE AND SCREEN
ABO/RH(D): A NEG
ANTIBODY SCREEN: NEGATIVE
Unit division: 0

## 2016-07-19 LAB — HEMOGLOBIN AND HEMATOCRIT, BLOOD
HCT: 27.5 % — ABNORMAL LOW (ref 36.0–46.0)
Hemoglobin: 9.1 g/dL — ABNORMAL LOW (ref 12.0–15.0)

## 2016-07-19 MED ORDER — RIVAROXABAN 15 MG PO TABS
15.0000 mg | ORAL_TABLET | Freq: Two times a day (BID) | ORAL | Status: DC
  Administered 2016-07-19 – 2016-07-23 (×10): 15 mg via ORAL
  Filled 2016-07-19 (×10): qty 1

## 2016-07-19 MED ORDER — IOPAMIDOL (ISOVUE-370) INJECTION 76%
INTRAVENOUS | Status: AC
  Administered 2016-07-19: 100 mL
  Filled 2016-07-19: qty 100

## 2016-07-19 NOTE — Progress Notes (Signed)
PROGRESS NOTE    Cassandra [name]  EXN:[id number] DOB: Dec 08, 1955 DOA: 07/08/2016 PCP: Patient, No Pcp Per   Outpatient Specialists:    Brief Narrative:  Cassandra [name] is a 61 y.o. 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  Dr. Milinda Antis of rheumatology was decreasing the amount of prednisone monthly. This month she decreased from 40 mg prednisone daily to 30.She presented with extensive  right lower extremity DVT . Vascular surgery was consulted for endovascular intervention. Patient failed procedure on 4/30. This procedure  reattempted 5/3.   Assessment & Plan:   Principal Problem:   AKI (acute kidney injury) ([place]) Active Problems:   GERD (gastroesophageal reflux disease)   Positive ANA (antinuclear antibody)   Right leg swelling   Dehydration   Anemia   Prolonged QT interval   DVT (deep venous thrombosis) ([facility])    Fever with leukopenia Found to have intermittent Low-grade fever    , chest x-ray, UA negative Blood culture from 4/29 negative Continue incentive spirometry Fever Likely due to dvt .  Currently afebrile.   DVT of RLE extensive swelling on right leg Vascular surgery consulted-failed endovascular intervention  4/30, repeat attempt at endovascular intervention  Thursday 5/3 with venogram with possible pharmacomechanical thrombectomy, possible overnight lysis.  Patient has been maintained on a heparin drip since admission changed to [place],.   Acute kidney injury on chronic kidney disease stage III: Resolved Urinalysis shows high specific gravity to suggest dehydration.  Creatinine down to 0.94, stop IV fluids.      Hypokalemia Repleted   Essential hypertension  well controlled.  - Hold lisinopril and furosemide 2/2 AKI  Prolonged QTC: Patient's  initial QTC is noted to be 605 - Avoid further QT prolonging medications. - improved on AM EKG to < 450 - no new changes.   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 Today hemoglobin is 7. 1 unit of prbc transfusion will be ordered. 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   Thrombocytopenia:   Improving, now 134 k >124k>135 K >128k>125  No previous history of low platelets counts previously noted in the past. Previously her platelet count has been between 175-243    [name] - protonix  Sinus tachycardia/SVT on 4/26-- resolved with vagal maneuver (blowing in straw)-- added back PRN BB if BP can tolerate, blood pressure soft Klonopin for anxiety   Hypoxia of unclear etiology. CXR does not show any edema or consolidation.  ? PE. Currently requiring 2 to 3 lit of [place] OXYGEN  At rest.  Patient symptomatic with sob moving or getting out of bed, getting CT chest for further evaluation.   DVT prophylaxis: xarelto.     Code Status: Full Code   Family Communication:  patient appears to understand plan of care   Disposition Plan: Vascular procedure 5/3,    Consultants:   vascular  Subjective: Patient hypoxic, 84 % on RA, 95% on 2 lit [place] oxygen.   Objective: Vitals:   07/18/16 2211 07/19/16 0500 07/19/16 1114 07/19/16 1300  BP:  (!) 118/57 (!) 141/52 124/60  Pulse: (!) 58 74 86 82  Resp:  18 18 18   Temp:  51.8 F (36.8 C) 98.2 F (36.8  C) 99 F (37.2 C)  TempSrc:  Oral Oral Oral  SpO2:  98% 91% 93%  Weight:      Height:        Intake/Output Summary (Last 24 hours) at 07/19/16 1351 Last data filed at 07/19/16 1100  Gross per 24 hour  Intake              905 ml  Output             2000 ml  Net            -1095 ml   Filed Weights   07/08/16 1847 07/09/16 0140 07/10/16 0431  Weight: 107.5 kg (237 lb) 106.3 kg (234 lb 6.4 oz) 109.5 kg (241 lb 6.4 oz)    Examination:  General exam: in bed, comfortable.  Respiratory system: Clear to auscultation. Respiratory effort normal. Cardiovascular system: S1 & S2 heard, RRR. No JVD, murmurs, rubs, gallops or clicks.. Gastrointestinal system: Abdomen is nondistended, soft and nontender. No organomegaly or masses felt. Normal bowel sounds heard. Central nervous system: Alert and oriented. No focal neurological deficits. Right leg swollen, pulses palpable, + edema but warmth same as other leg    Data Reviewed: I have personally reviewed following labs and imaging studies  CBC:  Recent Labs Lab 07/14/16 0228 07/15/16 0224 07/16/16 0437 07/16/16 0920 07/18/16 0218 07/19/16 1201  WBC 2.4* 1.5* 1.8* 2.2* 2.2*  --   NEUTROABS  --   --  1.6* 1.9 1.9  --   HGB 8.2* 7.8* 8.0* 8.1* 7.0* 9.1*  HCT 24.3* 23.5* 23.7* 24.7* 21.6* 27.5*  MCV 101.3* 102.2* 101.3* 101.2* 103.3*  --   PLT 135* 130* 135* 128* 125*  --    Basic Metabolic Panel:  Recent Labs Lab 07/14/16 0228 07/15/16 0224 07/15/16 1102 07/16/16 0437 07/17/16 0207 07/18/16 0218  NA 139 138  --  138 136 139  K 3.8 3.2*  --  3.8 4.0 3.9  CL 110 108  --  106 104 107  CO2 22 25  --  24 24 26   GLUCOSE 109* 123*  --  99 113* 118*  BUN 9 8  --  7 9 9   CREATININE 0.94 0.94  --  0.90 0.86 0.84  CALCIUM 8.5* 8.2*  --  8.5* 8.6* 8.1*  MG  --   --  1.6*  --   --   --    GFR: Estimated Creatinine Clearance: 82.1 mL/min (by C-G formula based on SCr of 0.84 mg/dL). Liver Function Tests:  Recent Labs Lab  07/14/16 0228 07/16/16 0437  AST 24 28  ALT 30 27  ALKPHOS 49 47  BILITOT 0.4 0.5  PROT 4.9* 4.8*  ALBUMIN 2.7* 2.5*   No results for input(s): LIPASE, AMYLASE in the last 168 hours. No results for input(s): AMMONIA in the last 168 hours. Coagulation Profile:  Recent Labs Lab 07/17/16 0207  INR 1.10   Cardiac Enzymes: No results for input(s): CKTOTAL, CKMB, CKMBINDEX, TROPONINI in the last 168 hours. BNP (last 3 results) No results for input(s): PROBNP in the last 8760 hours. HbA1C: No results for input(s): HGBA1C in the last 72  hours. CBG: No results for input(s): GLUCAP in the last 168 hours. Lipid Profile: No results for input(s): CHOL, HDL, LDLCALC, TRIG, CHOLHDL, LDLDIRECT in the last 72 hours. Thyroid Function Tests: No results for input(s): TSH, T4TOTAL, FREET4, T3FREE, THYROIDAB in the last 72 hours. Anemia Panel: No results for input(s): VITAMINB12, FOLATE, FERRITIN, TIBC, IRON, RETICCTPCT in the last 72 hours. Urine analysis:    Component Value Date/Time   COLORURINE COLORLESS (A) 07/13/2016 1651   APPEARANCEUR CLEAR 07/13/2016 1651   LABSPEC 1.002 (L) 07/13/2016 1651   PHURINE 6.0 07/13/2016 1651   GLUCOSEU NEGATIVE 07/13/2016 1651   GLUCOSEU NEGATIVE 11/30/2008 1522   HGBUR SMALL (A) 07/13/2016 1651   BILIRUBINUR NEGATIVE 07/13/2016 1651   KETONESUR NEGATIVE 07/13/2016 1651   PROTEINUR NEGATIVE 07/13/2016 1651   UROBILINOGEN 1 06/02/2013 1524   NITRITE NEGATIVE 07/13/2016 1651   LEUKOCYTESUR NEGATIVE 07/13/2016 1651     ) Recent Results (from the past 240 hour(s))  Culture, blood (routine x 2)     Status: None   Collection Time: 07/13/16 10:47 AM  Result Value Ref Range Status   Specimen Description BLOOD BLOOD RIGHT HAND  Final   Special Requests IN PEDIATRIC BOTTLE Blood Culture adequate volume  Final   Culture NO GROWTH 5 DAYS  Final   Report Status 07/18/2016 FINAL  Final  Culture, blood (routine x 2)     Status: None   Collection Time:  07/13/16 10:55 AM  Result Value Ref Range Status   Specimen Description BLOOD BLOOD LEFT HAND  Final   Special Requests IN PEDIATRIC BOTTLE Blood Culture adequate volume  Final   Culture NO GROWTH 5 DAYS  Final   Report Status 07/18/2016 FINAL  Final      Anti-infectives    Start     Dose/Rate Route Frequency Ordered Stop   07/17/16 1000  vancomycin (VANCOCIN) 1,000 mg in sodium chloride 0.9 % 250 mL IVPB  Status:  Discontinued     1,000 mg 250 mL/hr over 60 Minutes Intravenous Every 12 hours 07/17/16 0949 07/17/16 1329   07/17/16 0852  vancomycin (VANCOCIN) 1-5 GM/200ML-% IVPB    Comments:  Precious Haws   : cabinet override      07/17/16 0852 07/17/16 2059   07/14/16 0900  ceFAZolin (ANCEF) IVPB 2g/100 mL premix  Status:  Discontinued    Comments:  Send with pt to OR I am aware of her Ampicillin allergy thanks   2 g 200 mL/hr over 30 Minutes Intravenous To Short Stay 07/14/16 0528 07/14/16 1721       Radiology Studies: Dg Chest 2 View  Result Date: 07/18/2016 CLINICAL DATA:  Shortness of breath for several days EXAM: CHEST  2 VIEW COMPARISON:  July 13, 2016 FINDINGS: There is no edema or consolidation. There is cardiomegaly with pulmonary vascularity within normal limits. No adenopathy. There is mild degenerative change in the thoracic spine. IMPRESSION: Cardiomegaly.  No edema or consolidation. Electronically Signed   By: Lowella Grip III M.D.   On: 07/18/2016 10:53        Scheduled Meds: . darifenacin  7.5 mg Oral Daily  . metoprolol tartrate  25 mg Oral BID  . pantoprazole  40 mg Oral Daily  . predniSONE  30 mg Oral Daily  . Rivaroxaban  15 mg Oral BID WC  . sertraline  100 mg Oral QHS   Continuous Infusions: . sodium chloride 100 mL/hr at 07/19/16 0515  . sodium chloride       LOS:  9 days    Time spent: 25 min    Delecia Vastine, MD [facility] Pager [phone]  If 7PM-7AM, please contact night-coverage [email or web address] Password  [email or web address] 07/19/2016, 1:51 PM

## 2016-07-19 NOTE — Progress Notes (Addendum)
[facility] Progress Note  Subjective  - POD #2  Right leg feels ok.  Objective Vitals:   07/18/16 2211 07/19/16 0500  BP:  (!) 118/57  Pulse: (!) 58 74  Resp:  18  Temp:  98.2 F (36.8 C)    Intake/Output Summary (Last 24 hours) at 07/19/16 0932 Last data filed at 07/18/16 2008  Gross per 24 hour  Intake              905 ml  Output             1900 ml  Net             -995 ml   Right popliteal space without hematoma.  Right leg with moderate swelling.  Right foot warm. No pain or paresthesias.   Assessment/Planning: 61 y.o. female is s/p pharmacomechanical thrombectomy with angiojet of right femoral, common femoral, external iliac, common iliac vein, Balloon angioplasty of right femoral, common femoral  2 Days Post-Op   Right leg stable. Will need ted hose. Currently on Xarelto.   Shortness of breath yesterday with walking. Also requiring 02 at rest. Had an episode of SVT rate 140s yesterday as well. NSR now. PE in differential, but patient already on anticoagulation and 02 requirements down this am. Continue to monitor.  Stable from vascular standpoint. Patient to follow up in 4-6 weeks with IVC/iliac duplex.   Raymond Gurney 07/19/2016 9:32 AM -- Subjectively less right leg swelling.  Objectively about 5-10 percent larger than left.  Adequate perfusion. Having chills this morning.   s/p mechanical right leg thrombolysis for DVT f/u as above.  Fabienne Bruns, MD [facility] [place] Office: [phone] Pager: [phone]  Laboratory CBC    Component Value Date/Time   WBC 2.2 (L) 07/18/2016 0218   HGB 7.0 (L) 07/18/2016 0218   HCT 21.6 (L) 07/18/2016 0218   PLT 125 (L) 07/18/2016 0218    BMET    Component Value Date/Time   NA 139 07/18/2016 0218   K 3.9 07/18/2016 0218   CL 107 07/18/2016 0218   CO2 26 07/18/2016 0218   GLUCOSE 118 (H) 07/18/2016 0218   BUN 9 07/18/2016 0218   CREATININE 0.84 07/18/2016  0218   CREATININE 0.76 06/02/2013 1510   CALCIUM 8.1 (L) 07/18/2016 0218   GFRNONAA >60 07/18/2016 0218   GFRNONAA 87 06/02/2013 1510   GFRAA >60 07/18/2016 0218   GFRAA >89 06/02/2013 1510    COAG Lab Results  Component Value Date   INR 1.10 07/17/2016   INR 1.16 07/09/2016   INR 1.03 03/18/2016   No results found for: PTT  Antibiotics Anti-infectives    Start     Dose/Rate Route Frequency Ordered Stop   07/17/16 1000  vancomycin (VANCOCIN) 1,000 mg in sodium chloride 0.9 % 250 mL IVPB  Status:  Discontinued     1,000 mg 250 mL/hr over 60 Minutes Intravenous Every 12 hours 07/17/16 0949 07/17/16 1329   07/17/16 0852  vancomycin (VANCOCIN) 1-5 GM/200ML-% IVPB    Comments:  Lonia Chimera   : cabinet override      07/17/16 0852 07/17/16 2059   07/14/16 0900  ceFAZolin (ANCEF) IVPB 2g/100 mL premix  Status:  Discontinued    Comments:  Send with pt to OR I am aware of her Ampicillin allergy thanks   2 g 200 mL/hr over 30 Minutes Intravenous To Short Stay 07/14/16 0528 07/14/16 1721       Cala Bradford  Ellison Carwinrinh, PA-C [facility] Office: [phone] Pager: [phone] 07/19/2016 9:32 AM

## 2016-07-20 DIAGNOSIS — R0602 Shortness of breath: Secondary | ICD-10-CM

## 2016-07-20 LAB — URINALYSIS, ROUTINE W REFLEX MICROSCOPIC
BACTERIA UA: NONE SEEN
BILIRUBIN URINE: NEGATIVE
Glucose, UA: 50 mg/dL — AB
Ketones, ur: NEGATIVE mg/dL
LEUKOCYTES UA: NEGATIVE
Nitrite: NEGATIVE
PROTEIN: NEGATIVE mg/dL
SQUAMOUS EPITHELIAL / LPF: NONE SEEN
Specific Gravity, Urine: 1.002 — ABNORMAL LOW (ref 1.005–1.030)
pH: 7 (ref 5.0–8.0)

## 2016-07-20 NOTE — Progress Notes (Signed)
PROGRESS NOTE    AURIEL KIST  FWY:[id number] DOB: April 30, 1955 DOA: 07/08/2016 PCP: Patient, No Pcp Per   Outpatient Specialists:    Brief Narrative:  NILDA KEATHLEY is a 61 y.o. 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  Dr. Milinda Antis of rheumatology was decreasing the amount of prednisone monthly. This month she decreased from 40 mg prednisone daily to 30.She presented with extensive  right lower extremity DVT . Vascular surgery was consulted for endovascular intervention. Patient failed procedure on 4/30. This procedure  reattempted 5/3.   Assessment & Plan:   Principal Problem:   AKI (acute kidney injury) ([place]) Active Problems:   GERD (gastroesophageal reflux disease)   Positive ANA (antinuclear antibody)   Right leg swelling   Dehydration   Anemia   Prolonged QT interval   DVT (deep venous thrombosis) (HCC)    Fever with leukopenia Found to have intermittent Low-grade fever    , chest x-ray, UA negative Blood culture from 4/29 negative Continue incentive spirometry Fever Likely due to dvt .  Currently afebrile.   DVT of RLE extensive swelling on right leg Vascular surgery consulted-failed endovascular intervention  4/30, repeat attempt at endovascular intervention  Thursday 5/3 with venogram with possible pharmacomechanical thrombectomy, possible overnight lysis.  Patient has been maintained on a heparin drip since admission changed to [place],.   Acute kidney injury on chronic kidney disease stage III: Resolved Urinalysis shows high specific gravity to suggest dehydration.  Creatinine down to 0.94, stop IV fluids.      Hypokalemia Repleted   Essential hypertension  well controlled.  - Hold lisinopril and furosemide 2/2 AKI  Prolonged QTC: Patient's  initial QTC is noted to be 605 - Avoid further QT prolonging medications. - improved on AM EKG to < 450 - no new changes.   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 Today hemoglobin is 7. 1 unit of prbc transfusion will be ordered. REPEAT hemoglobin is 9.1. Repeat cbc in am to chest counts.  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   Thrombocytopenia:   Improving, now 134 k >124k>135 K >128k>125. Check platelet counts in am.   No previous history of low platelets counts previously noted in the past. Previously her platelet count has been between 175-243    [name] - protonix  Sinus tachycardia/SVT on 4/26-- resolved with vagal maneuver (blowing in straw)-- added back PRN BB if BP can tolerate, blood pressure soft Klonopin for anxiety   Hypoxia of unclear etiology. CXR does not show any edema or consolidation.  . Currently requiring 2 to 3 lit of [place] OXYGEN  At rest.  Patient symptomatic with sob moving or getting out of bed, getting CT chest for further evaluation.  CT angio of the chest shows submassive PE, with right heart starin, getting echocardiogram for further evaluation.  ? Does she need IVC filter. Will await vascular recommendations.  Continue with xarelto for now.   DVT prophylaxis: xarelto.     Code Status: Full Code   Family Communication:  patient appears to understand plan of care   Disposition Plan: Vascular procedure 5/3, possibly in 1 to 2 days.     Consultants:   vascular     Subjective: Patient hypoxic, 84 % on RA, 95% on 2 lit [place] oxygen.  Reports feeling the same as yesterday, sob on talking, and some chills.   Objective: Vitals:   07/19/16 1114 07/19/16 1300 07/19/16 1958 07/20/16 0452  BP: (!) 141/52 124/60 (!) 107/53 (!) 126/57  Pulse: 86 82 (!) 56 64  Resp: 18 18 18 18   Temp: 98.2 F (36.8 C) 99 F (37.2 C) 97.4 F (36.3 C) 97.9 F (36.6 C)  TempSrc: Oral Oral Oral Oral  SpO2: 91% 93% 96% 99%  Weight:      Height:        Intake/Output Summary (Last 24 hours) at 07/20/16 1019 Last data filed at 07/19/16 2300  Gross per 24 hour  Intake             4110 ml  Output             1000 ml  Net             3110 ml   Filed Weights   07/08/16 1847 07/09/16 0140 07/10/16 0431  Weight: 107.5 kg (237 lb) 106.3 kg (234 lb 6.4 oz) 109.5 kg (241 lb 6.4 oz)    Examination:  General exam: in bed, comfortable. On 3 lit of [place] oxygen.  Respiratory system: Clear to auscultation. Respiratory effort normal. Cardiovascular system: S1 & S2 heard, RRR. No JVD, murmurs, rubs, gallops or clicks.. Gastrointestinal system: Abdomen is nondistended, soft and nontender. No organomegaly or masses felt. Normal bowel sounds heard. Central nervous system: Alert and oriented. No focal neurological deficits. Right leg swollen, pulses palpable, + edema but warmth same as other leg    Data Reviewed: I have personally reviewed following labs and imaging studies  CBC:  Recent Labs Lab 07/14/16 0228 07/15/16 0224 07/16/16 0437 07/16/16 0920 07/18/16 0218 07/19/16 1201  WBC 2.4* 1.5* 1.8* 2.2* 2.2*  --   NEUTROABS  --   --  1.6* 1.9 1.9  --   HGB 8.2* 7.8* 8.0* 8.1* 7.0* 9.1*  HCT 24.3* 23.5* 23.7* 24.7* 21.6* 27.5*  MCV 101.3* 102.2* 101.3* 101.2* 103.3*  --   PLT 135* 130* 135* 128* 125*  --    Basic Metabolic Panel:  Recent Labs Lab 07/14/16 0228 07/15/16 0224 07/15/16 1102 07/16/16 0437 07/17/16 0207  07/18/16 0218  NA 139 138  --  138 136 139  K 3.8 3.2*  --  3.8 4.0 3.9  CL 110 108  --  106 104 107  CO2 22 25  --  24 24 26   GLUCOSE 109* 123*  --  99 113* 118*  BUN 9 8  --  7 9 9   CREATININE 0.94 0.94  --  0.90 0.86 0.84  CALCIUM 8.5* 8.2*  --  8.5* 8.6* 8.1*  MG  --   --  1.6*  --   --   --    GFR: Estimated Creatinine Clearance: 82.1 mL/min (by C-G formula based on SCr of 0.84 mg/dL). Liver Function Tests:  Recent Labs Lab 07/14/16 0228 07/16/16 0437  AST 24 28  ALT 30 27  ALKPHOS 49 47  BILITOT 0.4 0.5  PROT 4.9* 4.8*  ALBUMIN  2.7* 2.5*   No results for input(s): LIPASE, AMYLASE in the last 168 hours. No results for input(s): AMMONIA in the last 168 hours. Coagulation Profile:  Recent Labs Lab 07/17/16 0207  INR 1.10   Cardiac Enzymes: No results for input(s): CKTOTAL, CKMB, CKMBINDEX, TROPONINI in the last 168 hours. BNP (last 3 results) No results for input(s): PROBNP in the last 8760 hours. HbA1C: No results for input(s): HGBA1C in the last 72 hours. CBG: No results for input(s): GLUCAP in the last 168 hours. Lipid Profile: No results for input(s): CHOL, HDL, LDLCALC, TRIG, CHOLHDL, LDLDIRECT in the last 72 hours. Thyroid Function Tests: No results for input(s): TSH, T4TOTAL, FREET4, T3FREE, THYROIDAB in the last 72 hours. Anemia Panel: No results for input(s): VITAMINB12, FOLATE, FERRITIN, TIBC, IRON, RETICCTPCT in the last 72 hours. Urine analysis:    Component Value Date/Time   COLORURINE COLORLESS (A) 07/13/2016 1651   APPEARANCEUR CLEAR 07/13/2016 1651   LABSPEC 1.002 (L) 07/13/2016 1651   PHURINE 6.0 07/13/2016 1651   GLUCOSEU NEGATIVE 07/13/2016 1651   GLUCOSEU NEGATIVE 11/30/2008 1522   HGBUR SMALL (A) 07/13/2016 1651   BILIRUBINUR NEGATIVE 07/13/2016 1651   KETONESUR NEGATIVE 07/13/2016 1651   PROTEINUR NEGATIVE 07/13/2016 1651   UROBILINOGEN 1 06/02/2013 1524   NITRITE NEGATIVE 07/13/2016 1651   LEUKOCYTESUR NEGATIVE 07/13/2016 1651      ) Recent Results (from the past 240 hour(s))  Culture, blood (routine x 2)     Status: None   Collection Time: 07/13/16 10:47 AM  Result Value Ref Range Status   Specimen Description BLOOD BLOOD RIGHT HAND  Final   Special Requests IN PEDIATRIC BOTTLE Blood Culture adequate volume  Final   Culture NO GROWTH 5 DAYS  Final   Report Status 07/18/2016 FINAL  Final  Culture, blood (routine x 2)     Status: None   Collection Time: 07/13/16 10:55 AM  Result Value Ref Range Status   Specimen Description BLOOD BLOOD LEFT HAND  Final   Special Requests IN PEDIATRIC BOTTLE Blood Culture adequate volume  Final   Culture NO GROWTH 5 DAYS  Final   Report Status 07/18/2016 FINAL  Final      Anti-infectives    Start     Dose/Rate Route Frequency Ordered Stop   07/17/16 1000  vancomycin (VANCOCIN) 1,000 mg in sodium chloride 0.9 % 250 mL IVPB  Status:  Discontinued     1,000 mg 250 mL/hr over 60 Minutes Intravenous Every 12 hours 07/17/16 0949 07/17/16 1329   07/17/16 0852  vancomycin (VANCOCIN) 1-5 GM/200ML-% IVPB    Comments:  Precious Haws   : cabinet override      07/17/16 0852 07/17/16 2059   07/14/16 0900  ceFAZolin (ANCEF) IVPB 2g/100 mL premix  Status:  Discontinued    Comments:  Send with pt to OR I am aware of her Ampicillin allergy thanks   2 g 200 mL/hr over 30 Minutes Intravenous To Short Stay 07/14/16 0528 07/14/16 1721       Radiology Studies: Dg Chest 2 View  Result Date: 07/18/2016 CLINICAL DATA:  Shortness of breath for several days EXAM: CHEST  2 VIEW COMPARISON:  July 13, 2016 FINDINGS: There is no edema or consolidation. There is cardiomegaly with pulmonary vascularity within normal limits. No adenopathy. There is mild degenerative change in the thoracic spine. IMPRESSION: Cardiomegaly.  No edema or consolidation. Electronically Signed   By: Lowella Grip III M.D.   On: 07/18/2016 10:53   Ct Angio  Chest Pe W Or Wo Contrast  Result Date: 07/19/2016 CLINICAL  DATA:  Short of breath with known lower extremity blood clot. EXAM: CT ANGIOGRAPHY CHEST WITH CONTRAST TECHNIQUE: Multidetector CT imaging of the chest was performed using the standard protocol during bolus administration of intravenous contrast. Multiplanar CT image reconstructions and MIPs were obtained to evaluate the vascular anatomy. CONTRAST:  100 cc of Isovue 370 COMPARISON:  Plain films from 1 day prior.  CT of 07/08/2016. FINDINGS: Cardiovascular: The quality of this exam for evaluation of pulmonary embolism is sufficient. Moderate volume right-sided pulmonary emboli, including within the right and middle lobe lobar to segmental branches. Example image 57/series 5. The RV -LV ratio is 5.0/4.8 = 1.04. Moderate cardiomegaly with lipomatous hypertrophy of the interatrial septum. No pericardial effusion. Normal aortic caliber with mild aortic atherosclerosis. Mediastinum/Nodes: No mediastinal or hilar adenopathy. Small hiatal hernia. Lungs/Pleura: Trace bilateral pleural fluid. Motion and patient habitus degradation. Mosaic attenuation is favored to be related to mosaic perfusion. Upper Abdomen: Reflux of contrast in the IVC, suggesting elevated right heart pressures. Normal imaged portions of the liver, spleen, pancreas, gallbladder, adrenal glands, kidneys. Musculoskeletal: Mild thoracic spondylosis. Review of the MIP images confirms the above findings. IMPRESSION: 1. Moderate volume right-sided pulmonary embolism. Positive for acute PE with CT evidence of right heart strain (RV/LV Ratio = 1.04) consistent with at least submassive (intermediate risk) PE. The presence of right heart strain has been associated with an increased risk of morbidity and mortality. Please activate Code PE by paging [phone]. 2. Mild motion degradation. 3. Mosaic profusion, primarily felt to be related to air trapping. A portion of this could be related to pulmonary embolism. 4. Cardiomegaly. 5. Small hiatal hernia. 6.  Aortic  atherosclerosis. Critical test results telephoned to [facility], r.n. at the time of interpretation at . 5:25 p.m.on . 07/19/2016. Electronically Signed   By: Abigail Miyamoto M.D.   On: 07/19/2016 17:26        Scheduled Meds: . darifenacin  7.5 mg Oral Daily  . metoprolol tartrate  25 mg Oral BID  . pantoprazole  40 mg Oral Daily  . predniSONE  30 mg Oral Daily  . Rivaroxaban  15 mg Oral BID WC  . sertraline  100 mg Oral QHS   Continuous Infusions: . sodium chloride 100 mL/hr at 07/19/16 1625  . sodium chloride       LOS: 10 days    Time spent: 25 min    Samayah Novinger, MD [facility] Pager [phone]  If 7PM-7AM, please contact night-coverage [email or web address] Password [email or web address] 07/20/2016, 10:19 AM

## 2016-07-20 NOTE — Consult Note (Signed)
[facility] [place]  Subjective  - still with chills  Objective (!) 126/57 64 97.9 F (36.6 C) (Oral) 18 99%  Intake/Output Summary (Last 24 hours) at 07/20/16 0931 Last data filed at 07/19/16 2300  Gross per 24 hour  Intake             4110 ml  Output             1000 ml  Net             3110 ml   Chest CTA Right leg about the same amount swelling  CT chest shows right lung PE  Assessment/Planning: DVT right leg post angiojet Now has right lung PE mildly symptomatic Difficult to know if this is failure of anticoagulation versus periprocedural Will d/w Dr Randie Heinzain tomorrow.  She may need IVC filter.  Fabienne BrunsFields, [place] 07/20/2016 9:31 AM --  Laboratory Lab Results:  Recent Labs  07/18/16 0218 07/19/16 1201  WBC 2.2*  --   HGB 7.0* 9.1*  HCT 21.6* 27.5*  PLT 125*  --    BMET  Recent Labs  07/18/16 0218  NA 139  K 3.9  CL 107  CO2 26  GLUCOSE 118*  BUN 9  CREATININE 0.84  CALCIUM 8.1*    COAG Lab Results  Component Value Date   INR 1.10 07/17/2016   INR 1.16 07/09/2016   INR 1.03 03/18/2016   No results found for: PTT

## 2016-07-21 ENCOUNTER — Inpatient Hospital Stay (HOSPITAL_COMMUNITY): Payer: Medicare HMO

## 2016-07-21 DIAGNOSIS — M7989 Other specified soft tissue disorders: Secondary | ICD-10-CM

## 2016-07-21 LAB — CBC
HCT: 26 % — ABNORMAL LOW (ref 36.0–46.0)
Hemoglobin: 8.4 g/dL — ABNORMAL LOW (ref 12.0–15.0)
MCH: 32.3 pg (ref 26.0–34.0)
MCHC: 32.3 g/dL (ref 30.0–36.0)
MCV: 100 fL (ref 78.0–100.0)
Platelets: 151 10*3/uL (ref 150–400)
RBC: 2.6 MIL/uL — ABNORMAL LOW (ref 3.87–5.11)
RDW: 17 % — AB (ref 11.5–15.5)
WBC: 2.8 10*3/uL — ABNORMAL LOW (ref 4.0–10.5)

## 2016-07-21 MED ORDER — SENNOSIDES-DOCUSATE SODIUM 8.6-50 MG PO TABS
2.0000 | ORAL_TABLET | Freq: Two times a day (BID) | ORAL | Status: DC
  Administered 2016-07-21 – 2016-07-22 (×2): 2 via ORAL
  Filled 2016-07-21 (×4): qty 2

## 2016-07-21 MED ORDER — POLYETHYLENE GLYCOL 3350 17 G PO PACK
17.0000 g | PACK | Freq: Every day | ORAL | Status: DC
  Administered 2016-07-21: 17 g via ORAL
  Filled 2016-07-21 (×3): qty 1

## 2016-07-21 MED ORDER — DARIFENACIN HYDROBROMIDE ER 7.5 MG PO TB24
15.0000 mg | ORAL_TABLET | Freq: Every day | ORAL | Status: DC
  Administered 2016-07-22 – 2016-07-23 (×2): 15 mg via ORAL
  Filled 2016-07-21 (×2): qty 2

## 2016-07-21 NOTE — Progress Notes (Addendum)
[facility] [place]  Subjective  - Still has some shortness of breath.   Objective 124/64 66 98.5 F (36.9 C) (Oral) 18 99% No intake or output data in the 24 hours ending 07/21/16 0724  O2 via [place], non labored breathing at bedside Right LE with edema compared to left Knee high ted hose on Palpable radial and DP pulses B Heart RRR 67 bpm  Assessment/Planning: POD # 07/17/2016  Surgeon:  Apolinar JunesBrandon C. Randie Heinzain, MD Procedure Performed: 1.  US guided cannulation of right small saphenous vein 2.  Venogram of inferior vena cava 3.  Intravascular ultrasound of right femoral, common femoral, external iliac, common iliac vein and ivc 4.  pharmacomechanical thrombectomy with angiojet of right femoral, common femoral, external iliac, common iliac vein 5.  Balloon angioplasty of right femoral, common femoral with 8mm balloon and external iliac, common iliac veins on right with 12mm balloon  CT chest shows right lung PE She is currently taking Xarelto I asked the nurses to get her a pair of thigh high ted hose     COLLINS, EMMA MAUREEN 07/21/2016 7:24 AM --  Laboratory Lab Results:  Recent Labs  07/19/16 1201 07/21/16 0239  WBC  --  2.8*  HGB 9.1* 8.4*  HCT 27.5* 26.0*  PLT  --  151   BMET No results for input(s): NA, K, CL, CO2, GLUCOSE, BUN, CREATININE, CALCIUM in the last 72 hours.  COAG Lab Results  Component Value Date   INR 1.10 07/17/2016   INR 1.16 07/09/2016   INR 1.03 03/18/2016   No results found for: PTT  I have independently interviewed patient and agree with PA assessment and plan above. I discussed case with her and her sister by phone. Unclear when pe occurred and with that would not pursue ivc filter at this time. Need thigh high compression as tolerated.    Koen Antilla C. Randie Heinzain, MD [facility] [place] Office: [phone] Pager: [phone]

## 2016-07-21 NOTE — Progress Notes (Signed)
**  Preliminary report by tech**  There is evidence of acute deep vein thrombosis involving the common femoral, femoral, profunda femoral, and popliteal veins of the right lower extremity. The right external iliac vein was visualized and noted to be patent. There is also evidence of acute superficial vein thrombosis involving the lesser saphenous vein of the right lower extremity. There is no evidence of deep or superficial vein thrombosis involving the left lower extremity. There is no evidence of a Baker's cyst bilaterally. Results were given to the patient's nurse, Marchelle FolksAmanda.  07/21/16 1:50 PM Olen CordialGreg Arlyn Buerkle RVT

## 2016-07-21 NOTE — Progress Notes (Signed)
PROGRESS NOTE    Cassandra [name]  YSH:[id number] DOB: [birth year] DOA: 07/08/2016 PCP: Patient, No Pcp Per   Outpatient Specialists:    Brief Narrative:  Cassandra [name] is a 61 y.o. 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  Dr. Milinda Antis of rheumatology was decreasing the amount of prednisone monthly. This month she decreased from 40 mg prednisone daily to 30.She presented with extensive  right lower extremity DVT . Vascular surgery was consulted for endovascular intervention. Patient failed procedure on 4/30. This procedure  reattempted 5/3.   Assessment & Plan:   Principal Problem:   AKI (acute kidney injury) ([facility] [place]) Active Problems:   GERD (gastroesophageal reflux disease)   Positive ANA (antinuclear antibody)   Right leg swelling   Dehydration   Anemia   Prolonged QT interval   DVT (deep venous thrombosis) (HCC)    Fever with leukopenia Found to have intermittent Low-grade fever    , chest x-ray, UA negative Blood culture from 4/29 negative Continue incentive spirometry Fever Likely due to dvt .  Currently afebrile.   DVT of RLE extensive swelling on right leg Vascular surgery consulted-failed endovascular intervention  4/30, repeat attempt at endovascular intervention  Thursday 5/3 with venogram with possible pharmacomechanical thrombectomy, possible overnight lysis.  Patient has been maintained on a heparin drip since admission changed to [place],. Pt reports worsening pain in the right lower extremity and worsening swelling.  Repeat Venous duplex of the RLE is ordered.   Acute kidney injury on chronic kidney disease stage III: Resolved Urinalysis shows high specific gravity to suggest dehydration.  Creatinine down to 0.94, stop IV fluids.       Hypokalemia Repleted   Essential hypertension  well controlled.  - Hold lisinopril and furosemide 2/2 AKI  Prolonged QTC: Patient's initial QTC is noted to be 605 - Avoid further QT prolonging medications. - improved on AM EKG to < 450 - no new changes.   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   Thrombocytopenia:   Improving, now 134 k >124k>135 K >128k>125. Check platelet counts in am.   No previous history of low platelets counts previously noted in the past. Previously her platelet count has been between 175-243    [name] - protonix  Sinus tachycardia/SVT on 4/26-- resolved with vagal maneuver (blowing in straw)-- added back PRN BB if BP can tolerate, blood pressure soft Klonopin for anxiety   Hypoxia of unclear etiology. CXR does not show any edema or consolidation.  . Currently requiring 2 to 3 lit of [place] OXYGEN  At rest.  Patient symptomatic with sob moving or getting out of bed, getting CT chest for further evaluation.  CT angio of the chest shows submassive PE, with right heart starin,  getting echocardiogram for further evaluation.  ? Does she need IVC filter. Will await vascular recommendations.  Continue with xarelto for now.   DVT prophylaxis: xarelto.     Code Status: Full Code   Family Communication:  patient appears to understand  plan of care   Disposition Plan: Vascular procedure 5/3, possibly in 1 to 2 days.    Consultants:   vascular     Subjective: Still on [place] oxygen.    Objective: Vitals:   07/20/16 0452 07/20/16 1431 07/20/16 2006 07/21/16 0519  BP: (!) 126/57 (!) 106/58 (!) 100/50 124/64  Pulse: 64 78 (!) 57 66  Resp: _0 Temp: 97.9 F (36.6 C) 98.5 F (36.9 C) 97.5 F (36.4 C) 98.5 F (36.9 C)  TempSrc: Oral Oral Oral Oral  SpO2: 99% 94% 98% 99%  Weight:      Height:       No intake or output data in the 24 hours ending 07/21/16 1340 Filed Weights   07/08/16 1847 07/09/16 0140 07/10/16 0431  Weight: 107.5 kg (237 lb) 106.3 kg (234 lb 6.4 oz) 109.5 kg (241 lb 6.4 oz)    Examination:  General exam: in bed, comfortable. On 2 lit of [place] oxygen.  Respiratory system: Clear to auscultation. Respiratory effort normal. Cardiovascular system: S1 & S2 heard, RRR. No JVD, murmurs, rubs, gallops or clicks.. Gastrointestinal system: Abdomen is nondistended, soft and nontender. No organomegaly or masses felt. Normal bowel sounds heard. Central nervous system: Alert and oriented. No focal neurological deficits. Right leg swollen, pulses palpable, + edema but warmth same as other leg    Data Reviewed: I have personally reviewed following labs and imaging studies  CBC:  Recent Labs Lab 07/15/16 0224 07/16/16 0437 07/16/16 0920 07/18/16 0218 07/19/16 1201 07/21/16 0239  WBC 1.5* 1.8* 2.2* 2.2*  --  2.8*  NEUTROABS  --  1.6* 1.9 1.9  --   --   HGB 7.8* 8.0* 8.1* 7.0* 9.1* 8.4*  HCT 23.5* 23.7* 24.7* 21.6* 27.5* 26.0*  MCV 102.2* 101.3* 101.2* 103.3*  --  100.0  PLT 130* 135* 128* 125*  --  335   Basic Metabolic Panel:  Recent Labs Lab 07/15/16 0224 07/15/16 1102 07/16/16 0437 07/17/16 0207 07/18/16 0218  NA 138  --  138 136 139  K 3.2*  --  3.8 4.0 3.9  CL 108  --  106 104 107  CO2 25  --  _1 GLUCOSE 123*  --  99 113* 118*  BUN 8  --  _2 CREATININE  0.94  --  0.90 0.86 0.84  CALCIUM 8.2*  --  8.5* 8.6* 8.1*  MG  --  1.6*  --   --   --    GFR: Estimated Creatinine Clearance: 82.1 mL/min (by C-G formula based on SCr of 0.84 mg/dL). Liver Function Tests:  Recent Labs Lab 07/16/16 0437  AST 28  ALT 27  ALKPHOS 47  BILITOT 0.5  PROT 4.8*  ALBUMIN 2.5*   No results for input(s): LIPASE, AMYLASE in the last 168 hours. No results for input(s): AMMONIA in the last 168 hours. Coagulation Profile:  Recent Labs Lab 07/17/16 0207  INR 1.10   Cardiac Enzymes: No results for input(s): CKTOTAL, CKMB, CKMBINDEX, TROPONINI in the last 168 hours. BNP (last 3 results) No results for input(s): PROBNP in the last 8760 hours. HbA1C: No results for input(s): HGBA1C in the last 72 hours. CBG: No results for  input(s): GLUCAP in the last 168 hours. Lipid Profile: No results for input(s): CHOL, HDL, LDLCALC, TRIG, CHOLHDL, LDLDIRECT in the last 72 hours. Thyroid Function Tests: No results for input(s): TSH, T4TOTAL, FREET4, T3FREE, THYROIDAB in the last 72 hours. Anemia Panel: No results for input(s): VITAMINB12, FOLATE, FERRITIN, TIBC, IRON, RETICCTPCT in the last 72 hours. Urine analysis:    Component Value Date/Time   COLORURINE COLORLESS (A) 07/20/2016 1617   APPEARANCEUR CLEAR 07/20/2016 1617   LABSPEC 1.002 (L) 07/20/2016 1617   PHURINE 7.0 07/20/2016 1617   GLUCOSEU 50 (A) 07/20/2016 1617   GLUCOSEU NEGATIVE 11/30/2008 1522   HGBUR SMALL (A) 07/20/2016 1617   BILIRUBINUR NEGATIVE 07/20/2016 1617   KETONESUR NEGATIVE 07/20/2016 1617   PROTEINUR NEGATIVE 07/20/2016 1617   UROBILINOGEN 1 06/02/2013 1524   NITRITE NEGATIVE 07/20/2016 1617   LEUKOCYTESUR NEGATIVE 07/20/2016 1617     ) Recent Results (from the past 240 hour(s))  Culture, blood (routine x 2)     Status: None   Collection Time: 07/13/16 10:47 AM  Result Value Ref Range Status   Specimen Description BLOOD BLOOD RIGHT HAND  Final   Special Requests IN  PEDIATRIC BOTTLE Blood Culture adequate volume  Final   Culture NO GROWTH 5 DAYS  Final   Report Status 07/18/2016 FINAL  Final  Culture, blood (routine x 2)     Status: None   Collection Time: 07/13/16 10:55 AM  Result Value Ref Range Status   Specimen Description BLOOD BLOOD LEFT HAND  Final   Special Requests IN PEDIATRIC BOTTLE Blood Culture adequate volume  Final   Culture NO GROWTH 5 DAYS  Final   Report Status 07/18/2016 FINAL  Final      Anti-infectives    Start     Dose/Rate Route Frequency Ordered Stop   07/17/16 1000  vancomycin (VANCOCIN) 1,000 mg in sodium chloride 0.9 % 250 mL IVPB  Status:  Discontinued     1,000 mg 250 mL/hr over 60 Minutes Intravenous Every 12 hours 07/17/16 0949 07/17/16 1329   07/17/16 0852  vancomycin (VANCOCIN) 1-5 GM/200ML-% IVPB    Comments:  Precious Haws   : cabinet override      07/17/16 0852 07/17/16 2059   07/14/16 0900  ceFAZolin (ANCEF) IVPB 2g/100 mL premix  Status:  Discontinued    Comments:  Send with pt to OR I am aware of her Ampicillin allergy thanks   2 g 200 mL/hr over 30 Minutes Intravenous To Short Stay 07/14/16 0528 07/14/16 1721       Radiology Studies: Ct Angio Chest Pe W Or Wo Contrast  Result Date: 07/19/2016 CLINICAL DATA:  Short of breath with known lower extremity blood clot. EXAM: CT ANGIOGRAPHY CHEST WITH CONTRAST TECHNIQUE: Multidetector CT imaging of the chest was performed using the standard protocol during bolus administration of intravenous contrast. Multiplanar CT image reconstructions and MIPs were obtained to evaluate the vascular anatomy. CONTRAST:  100 cc of Isovue 370 COMPARISON:  Plain films from 1 day prior.  CT of 07/08/2016. FINDINGS: Cardiovascular: The quality of this exam for evaluation of pulmonary embolism is sufficient. Moderate volume right-sided pulmonary emboli, including within the right and middle lobe lobar to segmental branches. Example image 57/series 5. The RV -LV ratio is 5.0/4.8 = 1.04.  Moderate cardiomegaly with lipomatous hypertrophy of the interatrial septum. No pericardial effusion. Normal aortic caliber with mild aortic atherosclerosis. Mediastinum/Nodes: No mediastinal or hilar adenopathy. Small hiatal hernia. Lungs/Pleura: Trace bilateral pleural fluid. Motion and patient habitus  degradation. Mosaic attenuation is favored to be related to mosaic perfusion. Upper Abdomen: Reflux of contrast in the IVC, suggesting elevated right heart pressures. Normal imaged portions of the liver, spleen, pancreas, gallbladder, adrenal glands, kidneys. Musculoskeletal: Mild thoracic spondylosis. Review of the MIP images confirms the above findings. IMPRESSION: 1. Moderate volume right-sided pulmonary embolism. Positive for acute PE with CT evidence of right heart strain (RV/LV Ratio = 1.04) consistent with at least submassive (intermediate risk) PE. The presence of right heart strain has been associated with an increased risk of morbidity and mortality. Please activate Code PE by paging [phone]. 2. Mild motion degradation. 3. Mosaic profusion, primarily felt to be related to air trapping. A portion of this could be related to pulmonary embolism. 4. Cardiomegaly. 5. Small hiatal hernia. 6.  Aortic atherosclerosis. Critical test results telephoned to [facility], r.n. at the time of interpretation at . 5:25 p.m.on . 07/19/2016. Electronically Signed   By: Abigail Miyamoto M.D.   On: 07/19/2016 17:26        Scheduled Meds: . [START ON 07/22/2016] darifenacin  15 mg Oral Daily  . metoprolol tartrate  25 mg Oral BID  . pantoprazole  40 mg Oral Daily  . polyethylene glycol  17 g Oral Daily  . predniSONE  30 mg Oral Daily  . Rivaroxaban  15 mg Oral BID WC  . senna-docusate  2 tablet Oral BID  . sertraline  100 mg Oral QHS   Continuous Infusions: . sodium chloride       LOS: 11 days    Time spent: 25 min    ,, MD [facility] Pager [phone]  If 7PM-7AM, please contact  night-coverage [email or web address] Password [email or web address] 07/21/2016, 1:40 PM

## 2016-07-21 NOTE — Progress Notes (Signed)
PT Cancellation Note  Patient Details Name: Cassandra [name] MRN: [name] DOB: 31-May-1955   Cancelled Treatment:    Reason Eval/Treat Not Completed: Medical issues which prohibited therapy (pt with new PE and await vascular decision on management)   [name] [name] 07/21/2016, 9:47 AM  Delaney MeigsMaija Tabor Joven Mom, PT [phone]

## 2016-07-22 ENCOUNTER — Inpatient Hospital Stay (HOSPITAL_COMMUNITY): Payer: Medicare HMO

## 2016-07-22 DIAGNOSIS — I340 Nonrheumatic mitral (valve) insufficiency: Secondary | ICD-10-CM

## 2016-07-22 MED ORDER — PERFLUTREN LIPID MICROSPHERE
1.0000 mL | INTRAVENOUS | Status: AC | PRN
  Administered 2016-07-22: 2 mL via INTRAVENOUS
  Filled 2016-07-22: qty 10

## 2016-07-22 MED ORDER — PERFLUTREN LIPID MICROSPHERE
INTRAVENOUS | Status: AC
  Filled 2016-07-22: qty 10

## 2016-07-22 NOTE — Care Management Important Message (Signed)
Important Message  Patient Details  Name: Cassandra [name] MRN: [id number] Date of Birth: [birth year]   Medicare Important Message Given:  Yes    Kyla BalzarineShealy, Natalie Mceuen Abena 07/22/2016, 1:03 PM

## 2016-07-22 NOTE — Progress Notes (Signed)
  Echocardiogram 2D Echocardiogram with Definity has been performed.  Nolon RodBrown, Tony 07/22/2016, 2:54 PM

## 2016-07-22 NOTE — Progress Notes (Signed)
Physical Therapy Treatment Patient Details Name: Cassandra [name] MRN: [id number] DOB: 07-10-1955 Today's Date: 07/22/2016    History of Present Illness 61 yo admitted with RLE DVT which failed heparin and s/p angioplasty and thrombectomy 5/3 with new PE. PMHx: anxiety, SVT, Wegner's dz, R TKA, renal insufficiency    PT Comments    Pt very pleasant and reports no pain with improved breathing and activity tolerance. Pt on 1L at rest with sats 93% on arrival. Increased to 2L for gait with sats dropping to 88% even with increase to 4L pt continued to have sats 89-93% on O2 with gait. Pt continues to state sufficient family assist at D/C. Educated for energy conservation and treatment limited for echo.   HR 72-95   Follow Up Recommendations  Home health PT     Equipment Recommendations  None recommended by PT    Recommendations for Other Services       Precautions / Restrictions Precautions Precautions: Fall Precaution Comments: watch sats    Mobility  Bed Mobility Overal bed mobility: Modified Independent             General bed mobility comments: sit to supine  Transfers Overall transfer level: Modified independent                  Ambulation/Gait Ambulation/Gait assistance: Supervision Ambulation Distance (Feet): 300 Feet Assistive device: Rolling walker (2 wheeled) Gait Pattern/deviations: Step-through pattern;Decreased stride length   Gait velocity interpretation: Below normal speed for age/gender General Gait Details: 4 standing rest breaks with cues for pursed lip breathing and posture. pt with sats 88-92% throughout on 2-4L    Stairs            Wheelchair Mobility    Modified Rankin (Stroke Patients Only)       Balance Overall balance assessment: Needs assistance   Sitting balance-Leahy Scale: Good       Standing balance-Leahy Scale: Fair                              Cognition Arousal/Alertness: Awake/alert Behavior  During Therapy: WFL for tasks assessed/performed Overall Cognitive Status: Within Functional Limits for tasks assessed                                        Exercises      General Comments        Pertinent Vitals/Pain Pain Assessment: No/denies pain    Home Living                      Prior Function            PT Goals (current goals can now be found in the care plan section) Progress towards PT goals: Progressing toward goals    Frequency           PT Plan Current plan remains appropriate    Co-evaluation              AM-PAC PT "6 Clicks" Daily Activity  Outcome Measure  Difficulty turning over in bed (including adjusting bedclothes, sheets and blankets)?: None Difficulty moving from lying on back to sitting on the side of the bed? : None Difficulty sitting down on and standing up from a chair with arms (e.g., wheelchair, bedside commode, etc,.)?: A Little Help needed moving to and from  a bed to chair (including a wheelchair)?: None Help needed walking in hospital room?: A Little Help needed climbing 3-5 steps with a railing? : A Little 6 Click Score: 21    End of Session Equipment Utilized During Treatment: Gait belt;Oxygen Activity Tolerance: Patient tolerated treatment well Patient left: in bed;with call bell/phone within reach Nurse Communication: Mobility status PT Visit Diagnosis: Difficulty in walking, not elsewhere classified (R26.2)     Time: 1610-9604 PT Time Calculation (min) (ACUTE ONLY): 15 min  Charges:  $Gait Training: 8-22 mins                    G Codes:       Cassandra [name], PT [phone]    Cassandra [name] 07/22/2016, 1:55 PM

## 2016-07-22 NOTE — Progress Notes (Addendum)
[facility]  Subjective  - Doing ok today.     Objective (!) 124/54 68 98.1 F (36.7 C) (Oral) 18 95% No intake or output data in the 24 hours ending 07/22/16 0758  O2 via [place] NAD, non labored breathing Thigh high ted hose on Right LE edema without change    Assessment/Planning:  POD # 07/17/2016  Surgeon:Manju Kulkarni C. Randie Heinzain, MD Procedure Performed: 1. US guided cannulation of right small saphenous vein 2. Venogram of inferior vena cava 3. Intravascular ultrasound of right femoral, common femoral, external iliac, common iliac vein and ivc 4. pharmacomechanical thrombectomy with angiojet of right femoral, common femoral, external iliac, common iliac vein 5. Balloon angioplasty of right femoral, common femoral with 8mm balloon and external iliac, common iliac veins on right with 12mm balloon  Currently on Xarelto would not pursue ivc filter at this time  Clinton GallantCOLLINS, EMMA [facility] 07/22/2016 7:58 AM --  Laboratory Lab Results:  Recent Labs  07/19/16 1201 07/21/16 0239  WBC  --  2.8*  HGB 9.1* 8.4*  HCT 27.5* 26.0*  PLT  --  151   BMET No results for input(s): NA, K, CL, CO2, GLUCOSE, BUN, CREATININE, CALCIUM in the last 72 hours.  COAG Lab Results  Component Value Date   INR 1.10 07/17/2016   INR 1.16 07/09/2016   INR 1.03 03/18/2016   No results found for: PTT  I have independently interviewed patient and agree with PA assessment and plan above. Reviewed duplex from yesterday and demonstrates patent right external iliac vein with now partially thrombosed right common femoral (previously totally thrombosed). Her leg is much softer now with gentle thigh-high compression. Recommend ambulation, xarelto, and will need better compression on discharge home. Would not pursue other intervention at this time.   Mishawn Hemann C. Randie Heinzain, MD [facility] [place] Office: [phone] Pager: [phone]

## 2016-07-22 NOTE — Progress Notes (Signed)
PROGRESS NOTE    Cassandra [name]  DVV:[id number] DOB: 1955/09/15 DOA: 07/08/2016 PCP: Patient, No Pcp Per   Outpatient Specialists:    Brief Narrative:  Cassandra [name] is a 61 y.o. 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  Dr. Milinda Antis of rheumatology was decreasing the amount of prednisone monthly. This month she decreased from 40 mg prednisone daily to 30.She presented with extensive  right lower extremity DVT . Vascular surgery was consulted for endovascular intervention. Patient failed procedure on 4/30. This procedure  reattempted 5/3.   Assessment & Plan:   Principal Problem:   AKI (acute kidney injury) ([place]) Active Problems:   GERD (gastroesophageal reflux disease)   Positive ANA (antinuclear antibody)   Right leg swelling   Dehydration   Anemia   Prolonged QT interval   DVT (deep venous thrombosis) (HCC)    Fever with leukopenia Found to have intermittent Low-grade fever    , chest x-ray, UA negative Blood culture from 4/29 negative Continue incentive spirometry Fever Likely due to dvt .  Currently afebrile.   DVT of RLE extensive swelling on right leg Vascular surgery consulted-failed endovascular intervention  4/30, repeat attempt at endovascular intervention  Thursday 5/3 with venogram with possible pharmacomechanical thrombectomy, possible overnight lysis.  Patient has been maintained on a heparin drip since admission changed to [place],. Pt reports worsening pain in the right lower extremity and worsening swelling.  Repeat Venous duplex of the RLE is ordered, showed DVT.   Acute kidney injury on chronic kidney disease stage III: Resolved Urinalysis shows high specific gravity to suggest dehydration.  Creatinine down to 0.94, stopped IV fluids.      Hypokalemia Repleted   Essential hypertension  well controlled.  - Hold lisinopril and furosemide 2/2 AKI.   Prolonged QTC: Patient's initial QTC is noted to be 605 - Avoid further QT prolonging medications. - improved on AM EKG to < 450 - no new changes.   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     Anemia,leukopenia  ANC greater than 1000  Hemoglobin 10.9 on admission, now 8.8>8.2>7.8>8.0, may need transfusion if hemoglobin less than 8.0. 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   Thrombocytopenia:   Improving, now 134 k >124k>135 K >128k>125 to 150 and stable.No previous history of low platelets counts previously noted in the past. Previously her platelet count has been between 175-243    [name] - protonix  Sinus tachycardia/SVT on 4/26-- resolved with vagal maneuver (blowing in straw)-- no more episodes of SVT.     PULMONARY EMBOLISM / Hypoxia of unclear etiology. CXR does not show any edema or consolidation.  . Currently requiring 2 to 3 lit of [place] OXYGEN  At rest.  Patient symptomatic with sob moving or getting out of bed, getting CT chest for further evaluation.  CT angio of the chest shows submassive PE, with right heart starin, getting echocardiogram for further evaluation. Echo is done and results are pending.  We tried to wean her off the oxygen , but her sats dropped to 84 5 on RA, she was put back on oxygen.  She does not need IVC filter at this time as per vascular.  Continue with xarelto for now.   DVT prophylaxis: xarelto.     Code Status: Full  Code   Family Communication:  patient appears to understand plan of care   Disposition Plan: Vascular procedure 5/3, possibly HOME TOMORROW.    Consultants:   vascular     Subjective: Still on [place] oxygen. Tried to wean her off and her oxygen sats dropped to 84%.  She also reports some chills.    Objective: Vitals:   07/22/16 0443 07/22/16 1007 07/22/16 1221 07/22/16 1352  BP: (!) 124/54 (!) 116/59 (!) 128/51   Pulse: 68 76 78 95  Resp: 18  18   Temp: 98.1 F (36.7 C)  99.3 F (37.4 C)   TempSrc: Oral  Oral   SpO2: 95%  92% 94%  Weight:      Height:        Intake/Output Summary (Last 24 hours) at 07/22/16 1834 Last data filed at 07/22/16 1020  Gross per 24 hour  Intake              240 ml  Output                0 ml  Net              240 ml   Filed Weights   07/08/16 1847 07/09/16 0140 07/10/16 0431  Weight: 107.5 kg (237 lb) 106.3 kg (234 lb 6.4 oz) 109.5 kg (241 lb 6.4 oz)    Examination:  General exam: in bed, comfortable. On 2 lit of [place] oxygen.  Respiratory system: Clear to auscultation. Respiratory effort normal. Cardiovascular system: S1 & S2 heard, RRR. No JVD, murmurs, rubs, gallops or clicks.. Gastrointestinal system: Abdomen is nondistended, soft and nontender. No organomegaly or masses felt. Normal bowel sounds heard. Central nervous system: Alert and oriented. No focal neurological deficits. Right leg swollen, pulses palpable, + edema but warmth same as other leg    Data Reviewed: I have personally reviewed following labs and imaging studies  CBC:  Recent Labs Lab 07/16/16 0437 07/16/16 0920 07/18/16 0218 07/19/16 1201 07/21/16 0239  WBC 1.8* 2.2* 2.2*  --  2.8*  NEUTROABS 1.6* 1.9 1.9  --   --   HGB 8.0* 8.1* 7.0* 9.1* 8.4*  HCT 23.7* 24.7* 21.6* 27.5* 26.0*  MCV 101.3* 101.2* 103.3*  --  100.0  PLT 135* 128* 125*  --  678   Basic Metabolic Panel:  Recent Labs Lab 07/16/16 0437 07/17/16 0207 07/18/16 0218  NA 138 136 139   K 3.8 4.0 3.9  CL 106 104 107  CO2 24 24 26   GLUCOSE 99 113* 118*  BUN 7 9 9   CREATININE 0.90 0.86 0.84  CALCIUM 8.5* 8.6* 8.1*   GFR: Estimated Creatinine Clearance: 82.1 mL/min (by C-G formula based on SCr of 0.84 mg/dL). Liver Function Tests:  Recent Labs Lab 07/16/16 0437  AST 28  ALT 27  ALKPHOS 47  BILITOT 0.5  PROT 4.8*  ALBUMIN 2.5*   No results for input(s): LIPASE, AMYLASE in the last 168 hours. No results for input(s): AMMONIA in the last 168 hours. Coagulation Profile:  Recent Labs Lab 07/17/16 0207  INR 1.10   Cardiac Enzymes: No results for input(s): CKTOTAL, CKMB, CKMBINDEX, TROPONINI in the last  168 hours. BNP (last 3 results) No results for input(s): PROBNP in the last 8760 hours. HbA1C: No results for input(s): HGBA1C in the last 72 hours. CBG: No results for input(s): GLUCAP in the last 168 hours. Lipid Profile: No results for input(s): CHOL, HDL, LDLCALC, TRIG, CHOLHDL, LDLDIRECT in the last 72 hours. Thyroid Function Tests: No results for input(s): TSH, T4TOTAL, FREET4, T3FREE, THYROIDAB in the last 72 hours. Anemia Panel: No results for input(s): VITAMINB12, FOLATE, FERRITIN, TIBC, IRON, RETICCTPCT in the last 72 hours. Urine analysis:    Component Value Date/Time   COLORURINE COLORLESS (A) 07/20/2016 1617   APPEARANCEUR CLEAR 07/20/2016 1617   LABSPEC 1.002 (L) 07/20/2016 1617   PHURINE 7.0 07/20/2016 1617   GLUCOSEU 50 (A) 07/20/2016 1617   GLUCOSEU NEGATIVE 11/30/2008 1522   HGBUR SMALL (A) 07/20/2016 1617   BILIRUBINUR NEGATIVE 07/20/2016 1617   KETONESUR NEGATIVE 07/20/2016 1617   PROTEINUR NEGATIVE 07/20/2016 1617   UROBILINOGEN 1 06/02/2013 1524   NITRITE NEGATIVE 07/20/2016 1617   LEUKOCYTESUR NEGATIVE 07/20/2016 1617     ) Recent Results (from the past 240 hour(s))  Culture, blood (routine x 2)     Status: None   Collection Time: 07/13/16 10:47 AM  Result Value Ref Range Status   Specimen Description BLOOD BLOOD  RIGHT HAND  Final   Special Requests IN PEDIATRIC BOTTLE Blood Culture adequate volume  Final   Culture NO GROWTH 5 DAYS  Final   Report Status 07/18/2016 FINAL  Final  Culture, blood (routine x 2)     Status: None   Collection Time: 07/13/16 10:55 AM  Result Value Ref Range Status   Specimen Description BLOOD BLOOD LEFT HAND  Final   Special Requests IN PEDIATRIC BOTTLE Blood Culture adequate volume  Final   Culture NO GROWTH 5 DAYS  Final   Report Status 07/18/2016 FINAL  Final      Anti-infectives    Start     Dose/Rate Route Frequency Ordered Stop   07/17/16 1000  vancomycin (VANCOCIN) 1,000 mg in sodium chloride 0.9 % 250 mL IVPB  Status:  Discontinued     1,000 mg 250 mL/hr over 60 Minutes Intravenous Every 12 hours 07/17/16 0949 07/17/16 1329   07/17/16 0852  vancomycin (VANCOCIN) 1-5 GM/200ML-% IVPB    Comments:  Precious Haws   : cabinet override      07/17/16 0852 07/17/16 2059   07/14/16 0900  ceFAZolin (ANCEF) IVPB 2g/100 mL premix  Status:  Discontinued    Comments:  Send with pt to OR I am aware of her Ampicillin allergy thanks   2 g 200 mL/hr over 30 Minutes Intravenous To Short Stay 07/14/16 0528 07/14/16 1721       Radiology Studies: No results found.      Scheduled Meds: . darifenacin  15 mg Oral Daily  . metoprolol tartrate  25 mg Oral BID  . pantoprazole  40 mg Oral Daily  . perflutren lipid microspheres (DEFINITY) IV suspension      . polyethylene glycol  17 g Oral Daily  . predniSONE  30 mg Oral Daily  . Rivaroxaban  15 mg Oral BID WC  . senna-docusate  2 tablet Oral BID  . sertraline  100 mg Oral QHS   Continuous Infusions: . sodium chloride       LOS: 12 days    Time spent: 25 min    Cleotha Tsang, MD [facility] Pager [phone]  If 7PM-7AM, please contact night-coverage [email or web address] Password [facility] 07/22/2016,  6:34 PM

## 2016-07-23 LAB — ECHOCARDIOGRAM COMPLETE
CHL CUP DOP CALC LVOT VTI: 22.9 cm
CHL CUP MV M VEL: 69.3
CHL CUP TV REG PEAK VELOCITY: 309 cm/s
EERAT: 10.95
EWDT: 204 ms
FS: 29 % (ref 28–44)
Height: 62 in
IVS/LV PW RATIO, ED: 0.7
LA ID, A-P, ES: 51 mm
LA diam index: 2.46 cm/m2
LA vol A4C: 126 ml
LA vol index: 37.2 mL/m2
LAVOL: 77 mL
LDCA: 3.14 cm2
LEFT ATRIUM END SYS DIAM: 51 mm
LV E/e' medial: 10.95
LV E/e'average: 10.95
LV PW d: 11.4 mm — AB (ref 0.6–1.1)
LVELAT: 10.5 cm/s
LVOT MV VTI INDEX: 0.97 cm2/m2
LVOT MV VTI: 2.01
LVOT SV: 72 mL
LVOT peak vel: 78.3 cm/s
LVOTD: 20 mm
MV Annulus VTI: 35.8 cm
MV Dec: 204
MV Peak grad: 5 mmHg
MV pk E vel: 115 m/s
MVAP: 4.49 cm2
MVPKAVEL: 88.8 m/s
MVSPHT: 49 ms
Mean grad: 3 mmHg
RV LATERAL S' VELOCITY: 13.2 cm/s
RV TAPSE: 14.1 mm
TDI e' lateral: 10.5
TDI e' medial: 8.78
TR max vel: 309 cm/s
Weight: 3862.4 oz

## 2016-07-23 LAB — CBC
HEMATOCRIT: 26.9 % — AB (ref 36.0–46.0)
HEMOGLOBIN: 8.6 g/dL — AB (ref 12.0–15.0)
MCH: 32 pg (ref 26.0–34.0)
MCHC: 32 g/dL (ref 30.0–36.0)
MCV: 100 fL (ref 78.0–100.0)
PLATELETS: 166 10*3/uL (ref 150–400)
RBC: 2.69 MIL/uL — AB (ref 3.87–5.11)
RDW: 16.5 % — ABNORMAL HIGH (ref 11.5–15.5)
WBC: 3.2 10*3/uL — AB (ref 4.0–10.5)

## 2016-07-23 MED ORDER — RIVAROXABAN 15 MG PO TABS: 15.0000 mg | ORAL_TABLET | Freq: Two times a day (BID) | ORAL | 0 refills | Status: DC

## 2016-07-23 MED ORDER — RIVAROXABAN 20 MG PO TABS: 20.0000 mg | ORAL_TABLET | Freq: Every day | ORAL | 0 refills | Status: DC

## 2016-07-23 MED ORDER — CALCIUM CARBONATE ANTACID 500 MG PO CHEW: 1.0000 | CHEWABLE_TABLET | Freq: Four times a day (QID) | ORAL | Status: DC | PRN

## 2016-07-23 NOTE — Progress Notes (Signed)
Order received to discharge patient.  Telemetry monitor removed and CCMD notified.  PIV access removed.  Discharge instructions, follow up, medications and instructions for their use were discussed with patient. 

## 2016-07-23 NOTE — Care Management Note (Addendum)
Case Management Note Previous CM note initiated by Gala LewandowskyGraves-Bigelow, Brenda Kaye, RN--07/18/2016, 4:16 PM   Patient Details  Name: Cassandra [name] MRN: [id number] Date of Birth: [birth year]  Subjective/Objective:  Pt presented for AKI. Pt is from home with sister. Plan to return to parents home once stable. Pt will need order for HHPT and F2F once stable for d/c.                   Action/Plan: Agency List provided and pt chose [facility]. [facility] to begin within 24-48 hours post d/c. No DME needs at this time. Pt has Xarelto card and co pay given to pat at $14.10. No further needs at this time.   Expected Discharge Date:  07/23/16               Expected Discharge Plan:  Home w Home Health Services  In-House Referral:  NA  Discharge planning Services  CM Consult, Medication Assistance  Post Acute Care Choice:  Home Health Choice offered to:  Patient  DME Arranged:  Oxygen  DME Agency:  [facility]  HH Arranged:  PT, RN, Nurse's Aide [facility]  Status of Service:  Completed, signed off  If discussed at Long Length of Stay Meetings, dates discussed:    Discharge Disposition: home with home health   Additional Comments:  07/23/16- 1200- Donn PieriniKristi Leeland Lovelady RN, CM- pt for d/c today- orders for HHRN/PT/aide- [facility]- [facility]. Pt already has Xarelto card for 30 day free on discharge.   Update-1400- notified by bedside RN that pt will need home 02- order has been placed for home 02-3L- [facility]- if pt qualifies - portable tank to be delivered to room prior to discharge.  PCP- [facility]ielle [facility] [place]  Alberto Schoch, UnderwoodKristi Hall, [place]RN 07/23/2016, 12:18 PM [phone]

## 2016-07-23 NOTE — Discharge Summary (Signed)
Physician Discharge Summary  Cassandra [name] WIO:[id number] DOB: 01-04-1956 DOA: 07/08/2016  PCP: Patient, No Pcp Per  Admit date: 07/08/2016 Discharge date: 07/23/2016  Admitted From: Home. Disposition:  Home.   Recommendations for Outpatient Follow-up:  1. Follow up with PCP in 1-2 weeks 2. Please obtain BMP/CBC in one week 3. Please follow up with vascular as recommended.   Home Health:yes, oxygen.   Discharge Condition:stable.  CODE STATUS: full code.  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  Dr. Milinda Antis of rheumatology was decreasing the amount of prednisone monthly. This month she decreased from 40 mg prednisone daily to 30.She presented with extensive  right lower extremity DVT . Vascular surgery was consulted for endovascular intervention. Patient failed procedure on 4/30. This procedure  reattempted 5/3.  She was discharged on 5/9.  Discharge Diagnoses:  Principal Problem:   AKI (acute kidney injury) ([place]) Active Problems:   GERD (gastroesophageal reflux disease)   Positive ANA (antinuclear antibody)   Right leg swelling   Dehydration   Anemia   Prolonged QT interval   DVT (deep venous thrombosis) (HCC) Fever with leukopenia Found to have intermittent Low-grade fever    , chest x-ray, UA negative Blood culture from 4/29 negative Continue incentive spirometry Fever Likely due to dvt .  Currently afebrile.   DVT of RLE extensive swelling on right leg Vascular surgery consulted-failed endovascular intervention  4/30, repeat attempt at endovascular intervention  Thursday 5/3 with venogram with possible pharmacomechanical thrombectomy, possible overnight lysis.  Patient has  been maintained on a heparin drip since admission changed to [place],. Pt reports worsening pain in the right lower extremity and worsening swelling.  Repeat Venous duplex of the RLE is ordered, showed DVT.   Acute kidney injury on chronic kidney disease stage III: Resolved Urinalysis shows high specific gravity to suggest dehydration.  Creatinine down to 0.94, stopped IV fluids.      Hypokalemia Repleted   Essential hypertension  well controlled.  - Hold lisinopril and furosemide 2/2 AKI.   Prolonged QTC: Patient's initial QTC is noted to be 605 - Avoid further QT prolonging medications. - improved on AM EKG to < 450 - no new changes.   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     Anemia,leukopenia  ANC greater than 1000  Hemoglobin 10.9 on admission, now 8.8>8.2>7.8>8.0, may need transfusion if hemoglobin less than 8.0. 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   Thrombocytopenia:   Improving, now 134 k >124k>135 K >128k>125 to 150 and stable.No previous history of low platelets counts previously noted in the past. Previously her platelet count has been between 175-243    [name] - protonix  Sinus tachycardia/SVT on 4/26-- resolved with vagal maneuver (blowing in straw)-- no more episodes of SVT.     PULMONARY EMBOLISM / Hypoxia of unclear etiology. CXR does not show any edema or consolidation.  Marland Kitchen  Currently requiring 2 to 3 lit of [place] OXYGEN  At rest.  Patient symptomatic with sob moving or getting out of bed, getting CT chest for further evaluation.  CT angio of the  chest shows submassive PE, with right heart starin, getting echocardiogram for further evaluation. Echo is done and results are pending. We tried to wean her off the oxygen , but her sats dropped to 84 5 on RA, she was put back on oxygen.  She does not need IVC filter at this time as per vascular.  Continue with xarelto for now.    Discharge Instructions  Discharge Instructions    Diet - low sodium heart healthy    Complete by:  As directed    Discharge instructions    Complete by:  As directed    Follow up with PCP in one week.  Follow up with vascular as recommended.     Allergies as of 07/23/2016      Reactions   Effexor [venlafaxine] Hives   Myrbetriq [mirabegron] Other (See Comments)   BACK PAIN DRY MOUTH   Nsaids Other (See Comments)   VASCULITIS   Reglan [metoclopramide] Other (See Comments)   Blister in mouth    Clarithromycin    UNSPECIFIED REACTION    Flagyl [metronidazole]    UNSPECIFIED REACTION    Paxil [paroxetine]    UNSPECIFIED REACTION    Ampicillin Rash   Codeine Other (See Comments)   Head feels like its crawling   Fluoxetine Hcl Itching   Deep Itch   Sulfa Antibiotics Other (See Comments)   Stomach irritation   Tetracycline Nausea And Vomiting   Severe stomach pain      Medication List    STOP taking these medications   hydrocortisone 25 MG suppository Commonly known as:  ANUSOL-HC   hydrOXYzine 25 MG tablet Commonly known as:  ATARAX/VISTARIL   metFORMIN 500 MG tablet Commonly known as:  GLUCOPHAGE   prednisoLONE 5 MG Tabs tablet     TAKE these medications   calcium carbonate 500 MG chewable tablet Commonly known as:  TUMS - dosed in mg elemental calcium Chew 1 tablet (200 mg of elemental calcium total) by mouth every 6 (six) hours as needed for indigestion or heartburn.   cyclophosphamide 50 MG capsule Commonly known as:  CYTOXAN Take 150 mg by mouth daily.   DEXILANT 30 MG capsule Generic drug:  Dexlansoprazole Take 30 mg by  mouth daily.   furosemide 20 MG tablet Commonly known as:  LASIX Take 20 mg by mouth daily.   lisinopril 5 MG tablet Commonly known as:  PRINIVIL,ZESTRIL Take 5 mg by mouth daily.   metoprolol tartrate 25 MG tablet Commonly known as:  LOPRESSOR Take 1 tablet twice a day What changed:  how much to take  how to take this  when to take this  additional instructions   predniSONE 10 MG tablet Commonly known as:  DELTASONE Take 30 mg by mouth daily.   Rivaroxaban 15 MG Tabs tablet Commonly known as:  XARELTO Take 1 tablet (15 mg total) by mouth 2 (two) times daily with a meal.   rivaroxaban 20 MG Tabs tablet Commonly known as:  XARELTO Take 1 tablet (20 mg total) by mouth daily with supper. Start taking on:  08/09/2016   sertraline 100 MG tablet Commonly known as:  ZOLOFT TAKE 1 TABLET BY MOUTH EVERY NIGHT AT BEDTIME   VESICARE 5 MG tablet Generic drug:  solifenacin Take 5 mg by mouth daily.  Follow-up Information    [facility] Follow up.   Specialty:  Home Health Services Why:  Physical Therapy, RN, aide- please allow them 24-48 hr post discharge to contact you regarding home visits Contact information: [address] [place] [address] [phone]        Waynetta Sandy, MD Follow up in 6 week(s).   Specialties:  Vascular Surgery, Cardiology Why:  Our office will call you to arrange Contact information: [place] [address] [place] Follow up.   Why:  Home 02 referral-  Contact information: [place] [address] [phone]          Allergies  Allergen Reactions  . Effexor [Venlafaxine] Hives  . Myrbetriq [Mirabegron] Other (See Comments)    BACK PAIN DRY MOUTH  . Nsaids Other (See Comments)    VASCULITIS  . Reglan [Metoclopramide] Other (See Comments)    Blister in mouth   . Clarithromycin     UNSPECIFIED REACTION    . Flagyl [Metronidazole]     UNSPECIFIED REACTION   . Paxil [Paroxetine]     UNSPECIFIED REACTION   . Ampicillin Rash  . Codeine Other (See Comments)    Head feels like its crawling  . Fluoxetine Hcl Itching    Deep Itch   . Sulfa Antibiotics Other (See Comments)    Stomach irritation  . Tetracycline Nausea And Vomiting    Severe stomach pain    Consultations:  Vascular surgery.    Procedures/Studies: Dg Chest 2 View  Result Date: 07/18/2016 CLINICAL DATA:  Shortness of breath for several days EXAM: CHEST  2 VIEW COMPARISON:  July 13, 2016 FINDINGS: There is no edema or consolidation. There is cardiomegaly with pulmonary vascularity within normal limits. No adenopathy. There is mild degenerative change in the thoracic spine. IMPRESSION: Cardiomegaly.  No edema or consolidation. Electronically Signed   By: Lowella Grip III M.D.   On: 07/18/2016 10:53   Dg Chest 2 View  Result Date: 07/13/2016 CLINICAL DATA:  Fever starting today. Hx of heart murmur, hypertension, supraventricular tachycardia and wegner's disease. Pt c/o off and on cough that started the other day. EXAM: CHEST  2 VIEW COMPARISON:  03/16/2016 FINDINGS: Cardiac silhouette is mildly enlarged. No mediastinal or hilar masses. No evidence of adenopathy. Clear lungs.  No pleural effusion or pneumothorax. Skeletal structures are demineralized but intact. IMPRESSION: No acute cardiopulmonary disease. Electronically Signed   By: Lajean Manes M.D.   On: 07/13/2016 11:45   Ct Angio Chest Pe W Or Wo Contrast  Result Date: 07/19/2016 CLINICAL DATA:  Short of breath with known lower extremity blood clot. EXAM: CT ANGIOGRAPHY CHEST WITH CONTRAST TECHNIQUE: Multidetector CT imaging of the chest was performed using the standard protocol during bolus administration of intravenous contrast. Multiplanar CT image reconstructions and MIPs were obtained to evaluate the vascular anatomy. CONTRAST:  100 cc of Isovue 370 COMPARISON:   Plain films from 1 day prior.  CT of 07/08/2016. FINDINGS: Cardiovascular: The quality of this exam for evaluation of pulmonary embolism is sufficient. Moderate volume right-sided pulmonary emboli, including within the right and middle lobe lobar to segmental branches. Example image 57/series 5. The RV -LV ratio is 5.0/4.8 = 1.04. Moderate cardiomegaly with lipomatous hypertrophy of the interatrial septum. No pericardial effusion. Normal aortic caliber with mild aortic atherosclerosis. Mediastinum/Nodes: No mediastinal or hilar adenopathy. Small hiatal hernia. Lungs/Pleura: Trace  bilateral pleural fluid. Motion and patient habitus degradation. Mosaic attenuation is favored to be related to mosaic perfusion. Upper Abdomen: Reflux of contrast in the IVC, suggesting elevated right heart pressures. Normal imaged portions of the liver, spleen, pancreas, gallbladder, adrenal glands, kidneys. Musculoskeletal: Mild thoracic spondylosis. Review of the MIP images confirms the above findings. IMPRESSION: 1. Moderate volume right-sided pulmonary embolism. Positive for acute PE with CT evidence of right heart strain (RV/LV Ratio = 1.04) consistent with at least submassive (intermediate risk) PE. The presence of right heart strain has been associated with an increased risk of morbidity and mortality. Please activate Code PE by paging [phone]. 2. Mild motion degradation. 3. Mosaic profusion, primarily felt to be related to air trapping. A portion of this could be related to pulmonary embolism. 4. Cardiomegaly. 5. Small hiatal hernia. 6.  Aortic atherosclerosis. Critical test results telephoned to [facility], r.n. at the time of interpretation at . 5:25 p.m.on . 07/19/2016. Electronically Signed   By: Abigail Miyamoto M.D.   On: 07/19/2016 17:26   Ct Angio Chest Pe W And/or Wo Contrast  Result Date: 07/08/2016 CLINICAL DATA:  Shortness of breath for 6 months. RIGHT lower extremity swelling beginning today. History of stage 3 kidney  disease, hypertension, pulmonary vasculitis. EXAM: CT ANGIOGRAPHY CHEST WITH CONTRAST TECHNIQUE: Multidetector CT imaging of the chest was performed using the standard protocol during bolus administration of intravenous contrast. Multiplanar CT image reconstructions and MIPs were obtained to evaluate the vascular anatomy. CONTRAST:  75 cc Isovue 370 COMPARISON:  Chest CT February 28, 2016 FINDINGS: Mild respiratory motion degraded examination CARDIOVASCULAR: Adequate contrast opacification of the pulmonary artery's. Main pulmonary artery is not enlarged. No pulmonary arterial filling defects to the level of the segmental branches. Respiratory motion limits assessment for subsegmental emboli. Heart size is mildly enlarged. Mild coronary artery calcifications. No pericardial effusion. Thoracic aorta is normal course and caliber, LEFT vertebral artery arises directly from the aortic arch, normal variant. MEDIASTINUM/NODES: No lymphadenopathy by CT size criteria. LUNGS/PLEURA: Tracheobronchial tree is patent, no pneumothorax. Scattered, increasing ground-glass opacities with heterogeneous lung attenuation. No pleural effusion or focal consolidation. UPPER ABDOMEN: Included view of the abdomen is nonacute. Small to moderate hiatal hernia. MUSCULOSKELETAL: Visualized soft tissues and included osseous structures are nonacute. Mild degenerative change of the spine. Review of the MIP images confirms the above findings. IMPRESSION: No acute pulmonary embolism on this respiratory motion degraded examination. Heterogeneous lung attenuation and ground-glass opacities compatible with small airway disease, in part attributable to patient's known pulmonary vasculopathy. Electronically Signed   By: Elon Alas M.D.   On: 07/08/2016 22:15       Subjective: No chest pain or sob.   Discharge Exam: Vitals:   07/23/16 0355 07/23/16 1349  BP: 120/65 (!) 114/50  Pulse: 62 71  Resp: 18 18  Temp: 98 F (36.7 C) 97.7 F  (36.5 C)   Vitals:   07/22/16 2000 07/22/16 2037 07/23/16 0355 07/23/16 1349  BP: (!) 109/48  120/65 (!) 114/50  Pulse: (!) 52 62 62 71  Resp: _0 Temp: 97.7 F (36.5 C)  98 F (36.7 C) 97.7 F (36.5 C)  TempSrc: Oral  Oral Oral  SpO2: 98%  100% 98%  Weight:      Height:        General: Pt is alert, awake, not in acute distress Cardiovascular: RRR, S1/S2 +, no rubs, no gallops Respiratory: CTA bilaterally, no wheezing, no rhonchi Abdominal: Soft, NT, ND, bowel sounds +  Extremities: no edema, no cyanosis    The results of significant diagnostics from this hospitalization (including imaging, microbiology, ancillary and laboratory) are listed below for reference.     Microbiology: No results found for this or any previous visit (from the past 240 hour(s)).   Labs: BNP (last 3 results)  Recent Labs  07/08/16 2100  BNP 67.2   Basic Metabolic Panel:  Recent Labs Lab 07/17/16 0207 07/18/16 0218  NA 136 139  K 4.0 3.9  CL 104 107  CO2 24 26  GLUCOSE 113* 118*  BUN 9 9  CREATININE 0.86 0.84  CALCIUM 8.6* 8.1*   Liver Function Tests: No results for input(s): AST, ALT, ALKPHOS, BILITOT, PROT, ALBUMIN in the last 168 hours. No results for input(s): LIPASE, AMYLASE in the last 168 hours. No results for input(s): AMMONIA in the last 168 hours. CBC:  Recent Labs Lab 07/18/16 0218 07/19/16 1201 07/21/16 0239 07/23/16 0345  WBC 2.2*  --  2.8* 3.2*  NEUTROABS 1.9  --   --   --   HGB 7.0* 9.1* 8.4* 8.6*  HCT 21.6* 27.5* 26.0* 26.9*  MCV 103.3*  --  100.0 100.0  PLT 125*  --  151 166   Cardiac Enzymes: No results for input(s): CKTOTAL, CKMB, CKMBINDEX, TROPONINI in the last 168 hours. BNP: Invalid input(s): POCBNP CBG: No results for input(s): GLUCAP in the last 168 hours. D-Dimer No results for input(s): DDIMER in the last 72 hours. Hgb A1c No results for input(s): HGBA1C in the last 72 hours. Lipid Profile No results for input(s): CHOL, HDL,  LDLCALC, TRIG, CHOLHDL, LDLDIRECT in the last 72 hours. Thyroid function studies No results for input(s): TSH, T4TOTAL, T3FREE, THYROIDAB in the last 72 hours.  Invalid input(s): FREET3 Anemia work up No results for input(s): VITAMINB12, FOLATE, FERRITIN, TIBC, IRON, RETICCTPCT in the last 72 hours. Urinalysis    Component Value Date/Time   COLORURINE COLORLESS (A) 07/20/2016 1617   APPEARANCEUR CLEAR 07/20/2016 1617   LABSPEC 1.002 (L) 07/20/2016 1617   PHURINE 7.0 07/20/2016 1617   GLUCOSEU 50 (A) 07/20/2016 1617   GLUCOSEU NEGATIVE 11/30/2008 1522   HGBUR SMALL (A) 07/20/2016 1617   BILIRUBINUR NEGATIVE 07/20/2016 1617   KETONESUR NEGATIVE 07/20/2016 1617   PROTEINUR NEGATIVE 07/20/2016 1617   UROBILINOGEN 1 06/02/2013 1524   NITRITE NEGATIVE 07/20/2016 1617   LEUKOCYTESUR NEGATIVE 07/20/2016 1617   Sepsis Labs Invalid input(s): PROCALCITONIN,  WBC,  LACTICIDVEN Microbiology No results found for this or any previous visit (from the past 240 hour(s)).   Time coordinating discharge: Over 30 minutes  SIGNED:   Hosie Poisson, MD  [facility] 07/23/2016, 11:00 PM Pager   If 7PM-7AM, please contact night-coverage [email or web address] Password [email or web address]

## 2016-07-23 NOTE — Progress Notes (Addendum)
[facility] [place]  Subjective  - She states the thigh high teds do make her legs feel better.   Objective 120/65 62 98 F (36.7 C) (Oral) 18 100%  Intake/Output Summary (Last 24 hours) at 07/23/16 [id number] Last data filed at 07/22/16 1020  Gross per 24 hour  Intake              240 ml  Output                0 ml  Net              240 ml    Right LE edema, but legs are soft with palpable distal pulses B Resting on RA this am with non labored breathing  Assessment/Planning: Surgeon:Brandon C. Randie Heinzain, MD Procedure Performed: 1. US guided cannulation of right small saphenous vein 2. Venogram of inferior vena cava 3. Intravascular ultrasound of right femoral, common femoral, external iliac, common iliac vein and ivc 4. pharmacomechanical thrombectomy with angiojet of right femoral, common femoral, external iliac, common iliac vein 5. Balloon angioplasty of right femoral, common femoral with 8mm balloon and external iliac, common iliac veins on right with 12mm balloon  Currently on Xarelto  Recommend thigh compress daily  Cassandra [name], Cassandra [name] 07/23/2016 7:22 AM --  Laboratory Lab Results:  Recent Labs  07/21/16 0239 07/23/16 0345  WBC 2.8* 3.2*  HGB 8.4* 8.6*  HCT 26.0* 26.9*  PLT 151 166   BMET No results for input(s): NA, K, CL, CO2, GLUCOSE, BUN, CREATININE, CALCIUM in the last 72 hours.  COAG Lab Results  Component Value Date   INR 1.10 07/17/2016   INR 1.16 07/09/2016   INR 1.03 03/18/2016   No results found for: PTT  I have independently interviewed patient and agree with PA assessment and plan above.   Brandon C. Randie Heinzain, MD [facility] [place] Office: [phone] Pager: [phone]

## 2016-07-23 NOTE — Progress Notes (Signed)
SATURATION QUALIFICATIONS: (This note is used to comply with regulatory documentation for home oxygen)  Patient Saturations on Room Air at Rest = 86-88%  Patient Saturations on Room Air while Ambulating = 84%  Patient Saturations on 3 Liters of oxygen while Ambulating = 91%  Please briefly explain why patient needs home oxygen: Patient's saturation is below 87% without supplemental O2 while at rest.  Saturation dropped quickly upon ambulation and did not recover higher than 91% with 3 liters [place].

## 2016-08-12 ENCOUNTER — Ambulatory Visit (INDEPENDENT_AMBULATORY_CARE_PROVIDER_SITE_OTHER): Payer: Medicare HMO | Admitting: Adult Health

## 2016-08-12 ENCOUNTER — Encounter: Payer: Self-pay | Admitting: Adult Health

## 2016-08-12 VITALS — BP 122/84 | HR 73 | Ht 63.0 in | Wt 224.4 lb

## 2016-08-12 DIAGNOSIS — I2699 Other pulmonary embolism without acute cor pulmonale: Secondary | ICD-10-CM | POA: Diagnosis not present

## 2016-08-12 DIAGNOSIS — I7782 Antineutrophilic cytoplasmic antibody (ANCA) vasculitis: Secondary | ICD-10-CM

## 2016-08-12 DIAGNOSIS — I776 Arteritis, unspecified: Secondary | ICD-10-CM | POA: Diagnosis not present

## 2016-08-12 MED ORDER — DEXILANT 30 MG PO CPDR: 30.0000 mg | DELAYED_RELEASE_CAPSULE | Freq: Every day | ORAL | 5 refills | Status: DC

## 2016-08-12 MED ORDER — RIVAROXABAN 20 MG PO TABS: 20.0000 mg | ORAL_TABLET | Freq: Every day | ORAL | 4 refills | Status: DC

## 2016-08-12 NOTE — Assessment & Plan Note (Signed)
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  Will discuss on return longevity of tx with Dr. Marchelle Gearingamaswamy .   Plan  Patient Instructions  Continue on Xarelto 20mg  daily  I will send refills for this .  2 D echo in 3 months  Follow up Dr. Marchelle Gearingamaswamy in 3 months and As needed   Report any signs of bleeding .  Do not take any NSAIDs, advil, aleve, ibuprofen, etc.  Continue to follow with Renal for prednisone/cytoxan schedule.  Please contact office for sooner follow up if symptoms do not improve or worsen or seek emergency care  Continue on oxygen.

## 2016-08-12 NOTE — Patient Instructions (Addendum)
Continue on Xarelto 20mg  daily  I will send refills for this .  2 D echo in 3 months  Follow up Dr. Marchelle Gearingamaswamy in 3 months and As needed   Report any signs of bleeding .  Do not take any NSAIDs, advil, aleve, ibuprofen, etc.  Continue to follow with Renal for prednisone/cytoxan schedule.  Please contact office for sooner follow up if symptoms do not improve or worsen or seek emergency care  Continue on oxygen.

## 2016-08-12 NOTE — Addendum Note (Signed)
Addended by: Boone MasterJONES, JESSICA E on: 08/12/2016 12:35 PM   Modules accepted: Orders

## 2016-08-12 NOTE — Assessment & Plan Note (Signed)
Cont follow up with Nephrology as planned  Cont on Cytoxan/prednisone as directed per renal .  follow up in 3 months

## 2016-08-12 NOTE — Progress Notes (Signed)
 @Patient  ID: Cassandra [name], female    DOB: [birth year], 61 y.o.   MRN: [id number]  Chief Complaint  Patient presents with  . Follow-up    Referring provider: No ref. provider found  HPI: 61 year old female never smoker followed for with hx of Wegner's Granulamatosis /P- ANCA Vasculitis   TEST  12/14 CT Chest high resolution>>Extensive patchy confluent and vaguely nodular peribronchovascular ground-glass opacities throughout both lungswith associated mild patchy subpleural reticulation. This spectrumof findings is most consistent with pulmonary vasculitis given theclinical history, and has significantly worsened since the05/20/2015 high-resolution chest CT study. These findings are not consistent with usual interstitial pneumonia (UIP). 07/08/16 Ven Doppler + R DVT  07/09/16 CTa chest neg for PE  07/19/16 CTa CHest + mod right sided PE ,  Echo 5/8 >EF 60%, Gr 2 DD , PAP 46mmHg, mild/mod RV dilation .   08/12/2016 Follow up : Pierre BaliWegners Granulamastosis /P ANCA  Patient resents for a post hospital follow-up . Patient was admitted earlier this month Patient has known pulmonary ANCA vasculitis. Patient was last seen in the office in December 2017 and did not keep follow-up as recommended. She was started on prednisone.  Says she is following with Nephrology for Wegners (Dr Lamont DowdyKolluru Sarath , [place] . Currently on Prednisone 40mg  daily and Cytoxan.  No further hematuria or hemoptysis   She was recently admitted earlier this month after found to have a DVT on right 4/24/8 . She was started on Heparin and underwent thrombectomy on 5/3. On 07/19/16 found to have moderate right sided PE. Echo showed gr 2 DD , PAP 46 and mild/mod RV dilation .  She is feeling better but still gets winded. Remains on O2 at 3l/m .  She is on Xarelto with recent conversion to 20 mg daily. She has no refills on her prescription. Refills were given. Patient is advised she will need a follow-up echo in 3  months. She denies any bleeding. Advised on avoidance of nonsteroidals. She has no previous history of blood clot.    Allergies  Allergen Reactions  . Effexor [Venlafaxine] Hives  . Myrbetriq [Mirabegron] Other (See Comments)    BACK PAIN DRY MOUTH  . Nsaids Other (See Comments)    VASCULITIS  . Reglan [Metoclopramide] Other (See Comments)    Blister in mouth   . Clarithromycin     UNSPECIFIED REACTION   . Flagyl [Metronidazole]     UNSPECIFIED REACTION   . Paxil [Paroxetine]     UNSPECIFIED REACTION   . Ampicillin Rash  . Codeine Other (See Comments)    Head feels like its crawling  . Fluoxetine Hcl Itching    Deep Itch   . Sulfa Antibiotics Other (See Comments)    Stomach irritation  . Tetracycline Nausea And Vomiting    Severe stomach pain    Immunization History  Administered Date(s) Administered  . Influenza Split 11/27/2011, 12/15/2012, 11/25/2013  . Influenza,inj,Quad PF,36+ Mos 02/20/2016  . Pneumococcal Polysaccharide-23 11/15/2013  . Td 12/16/2003    Past Medical History:  Diagnosis Date  . Allergy   . Anxiety   . Complication of anesthesia    difficulty waking up  . Depression   . Dysrhythmia    svt  . GERD (gastroesophageal reflux disease)   . Heart murmur    never seen cardiologist for this  . Hypertension   . Leukocytoclastic vasculitis (HCC)    Dr. Marchelle Gearingamaswamy and Dr. Corliss Skainseveshwar  . Migraines    maybe one a year  .  Renal disorder   . Renal insufficiency   . Sleep apnea    cpap  . SVT (supraventricular tachycardia) (HCC)   . Wegner's disease (congenital syphilitic osteochondritis)     Tobacco History: History  Smoking Status  . Never Smoker  Smokeless Tobacco  . Never Used   Counseling given: Not Answered   Outpatient Encounter Prescriptions as of 08/12/2016  Medication Sig  . calcium carbonate (TUMS - DOSED IN MG ELEMENTAL CALCIUM) 500 MG chewable tablet Chew 1 tablet (200 mg of elemental calcium total) by mouth every 6 (six)  hours as needed for indigestion or heartburn.  . cyclophosphamide (CYTOXAN) 50 MG capsule Take 150 mg by mouth daily.  Marland Kitchen DEXILANT 30 MG capsule Take 30 mg by mouth daily.  . furosemide (LASIX) 20 MG tablet Take 20 mg by mouth daily.  Marland Kitchen lisinopril (PRINIVIL,ZESTRIL) 5 MG tablet Take 5 mg by mouth daily.  . metoprolol (LOPRESSOR) 25 MG tablet Take 1 tablet twice a day (Patient taking differently: Take 25 mg by mouth 2 (two) times daily. )  . predniSONE (DELTASONE) 10 MG tablet Take 30 mg by mouth daily.   . rivaroxaban (XARELTO) 20 MG TABS tablet Take 1 tablet (20 mg total) by mouth daily with supper.  . sertraline (ZOLOFT) 100 MG tablet TAKE 1 TABLET BY MOUTH EVERY NIGHT AT BEDTIME  . VESICARE 5 MG tablet Take 5 mg by mouth daily.  . Rivaroxaban (XARELTO) 15 MG TABS tablet Take 1 tablet (15 mg total) by mouth 2 (two) times daily with a meal.   No facility-administered encounter medications on file as of 08/12/2016.      Review of Systems  Constitutional:   No  weight loss, night sweats,  Fevers, chills, + fatigue, or  lassitude.  HEENT:   No headaches,  Difficulty swallowing,  Tooth/dental problems, or  Sore throat,                No sneezing, itching, ear ache, nasal congestion, post nasal drip,   CV:  No chest pain,  Orthopnea, PND, swelling in lower extremities, anasarca, dizziness, palpitations, syncope.   GI  No heartburn, indigestion, abdominal pain, nausea, vomiting, diarrhea, change in bowel habits, loss of appetite, bloody stools.   Resp: No shortness of breath with exertion or at rest.  No excess mucus, no productive cough,  No non-productive cough,  No coughing up of blood.  No change in color of mucus.  No wheezing.  No chest wall deformity  Skin: no rash or lesions.  GU: no dysuria, change in color of urine, no urgency or frequency.  No flank pain, no hematuria   MS:  No joint pain or swelling.  No decreased range of motion.  No back pain.    Physical Exam  BP  122/84 (BP Location: Right Arm, Cuff Size: Normal)   Pulse 73   Ht 5\' 3"  (1.6 m)   Wt 224 lb 6.4 oz (101.8 kg)   SpO2 99%   BMI 39.75 kg/m   GEN: A/Ox3; pleasant , NAD, obese   HEENT:  Shepherd/AT,  EACs-clear, TMs-wnl, NOSE-clear, THROAT-clear, no lesions, no postnasal drip or exudate noted.   NECK:  Supple w/ fair ROM; no JVD; normal carotid impulses w/o bruits; no thyromegaly or nodules palpated; no lymphadenopathy.    RESP  Clear  P & A; w/o, wheezes/ rales/ or rhonchi. no accessory muscle use, no dullness to percussion  CARD:  RRR, no m/r/g, tr  peripheral edema, pulses intact, no  cyanosis or clubbing.  GI:   Soft & nt; nml bowel sounds; no organomegaly or masses detected.   Musco: Warm bil, no deformities or joint swelling noted.   Neuro: alert, no focal deficits noted.    Skin: Warm, no lesions or rashes    Lab Results:   ProBNP No results found for: PROBNP  Imaging: Dg Chest 2 View  Result Date: 07/18/2016 CLINICAL DATA:  Shortness of breath for several days EXAM: CHEST  2 VIEW COMPARISON:  July 13, 2016 FINDINGS: There is no edema or consolidation. There is cardiomegaly with pulmonary vascularity within normal limits. No adenopathy. There is mild degenerative change in the thoracic spine. IMPRESSION: Cardiomegaly.  No edema or consolidation. Electronically Signed   By: Bretta Bang III M.D.   On: 07/18/2016 10:53   Ct Angio Chest Pe W Or Wo Contrast  Result Date: 07/19/2016 CLINICAL DATA:  Short of breath with known lower extremity blood clot. EXAM: CT ANGIOGRAPHY CHEST WITH CONTRAST TECHNIQUE: Multidetector CT imaging of the chest was performed using the standard protocol during bolus administration of intravenous contrast. Multiplanar CT image reconstructions and MIPs were obtained to evaluate the vascular anatomy. CONTRAST:  100 cc of Isovue 370 COMPARISON:  Plain films from 1 day prior.  CT of 07/08/2016. FINDINGS: Cardiovascular: The quality of this exam for  evaluation of pulmonary embolism is sufficient. Moderate volume right-sided pulmonary emboli, including within the right and middle lobe lobar to segmental branches. Example image 57/series 5. The RV -LV ratio is 5.0/4.8 = 1.04. Moderate cardiomegaly with lipomatous hypertrophy of the interatrial septum. No pericardial effusion. Normal aortic caliber with mild aortic atherosclerosis. Mediastinum/Nodes: No mediastinal or hilar adenopathy. Small hiatal hernia. Lungs/Pleura: Trace bilateral pleural fluid. Motion and patient habitus degradation. Mosaic attenuation is favored to be related to mosaic perfusion. Upper Abdomen: Reflux of contrast in the IVC, suggesting elevated right heart pressures. Normal imaged portions of the liver, spleen, pancreas, gallbladder, adrenal glands, kidneys. Musculoskeletal: Mild thoracic spondylosis. Review of the MIP images confirms the above findings. IMPRESSION: 1. Moderate volume right-sided pulmonary embolism. Positive for acute PE with CT evidence of right heart strain (RV/LV Ratio = 1.04) consistent with at least submassive (intermediate risk) PE. The presence of right heart strain has been associated with an increased risk of morbidity and mortality. Please activate Code PE by paging [phone]. 2. Mild motion degradation. 3. Mosaic profusion, primarily felt to be related to air trapping. A portion of this could be related to pulmonary embolism. 4. Cardiomegaly. 5. Small hiatal hernia. 6.  Aortic atherosclerosis. Critical test results telephoned to [employer], r.n. at the time of interpretation at . 5:25 p.m.on . 07/19/2016. Electronically Signed   By: Jeronimo Greaves M.D.   On: 07/19/2016 17:26     Assessment & Plan:   ANCA-associated vasculitis Cont follow up with Nephrology as planned  Cont on Cytoxan/prednisone as directed per renal .  follow up in 3 months   Pulmonary embolism (HCC) Unprovoked PE/R DVT ( other than sedentary lifestyle for RF .obesity ) Will need follow  up echo for RHS in 3 months  Cont on Xarelto .  Will discuss on return longevity of tx with Dr. Marchelle Gearing .   Plan  Patient Instructions  Continue on Xarelto 20mg  daily  I will send refills for this .  2 D echo in 3 months  Follow up Dr. Marchelle Gearing in 3 months and As needed   Report any signs of bleeding .  Do not take any NSAIDs,  advil, aleve, ibuprofen, etc.  Continue to follow with Renal for prednisone/cytoxan schedule.  Please contact office for sooner follow up if symptoms do not improve or worsen or seek emergency care  Continue on oxygen.         Rubye Oaks, NP 08/12/2016

## 2016-08-14 ENCOUNTER — Encounter (HOSPITAL_COMMUNITY): Payer: Medicare HMO

## 2016-08-15 ENCOUNTER — Encounter: Payer: Medicare HMO | Admitting: Vascular Surgery

## 2016-08-16 NOTE — Addendum Note (Signed)
Addendum  created 08/16/16 0909 by Anushree Dorsi, MD   Sign clinical note    

## 2016-08-25 ENCOUNTER — Other Ambulatory Visit (HOSPITAL_COMMUNITY): Payer: Medicare HMO

## 2016-08-28 ENCOUNTER — Ambulatory Visit (HOSPITAL_COMMUNITY)
Admission: RE | Admit: 2016-08-28 | Discharge: 2016-08-28 | Disposition: A | Discharge status: Home / Self Care | Payer: Medicare HMO | Source: Ambulatory Visit | Attending: Vascular Surgery | Admitting: Vascular Surgery

## 2016-08-28 DIAGNOSIS — I82411 Acute embolism and thrombosis of right femoral vein: Secondary | ICD-10-CM | POA: Insufficient documentation

## 2016-08-28 DIAGNOSIS — I82402 Acute embolism and thrombosis of unspecified deep veins of left lower extremity: Secondary | ICD-10-CM

## 2016-08-28 DIAGNOSIS — I82431 Acute embolism and thrombosis of right popliteal vein: Secondary | ICD-10-CM | POA: Diagnosis not present

## 2016-08-28 DIAGNOSIS — Z9862 Peripheral vascular angioplasty status: Secondary | ICD-10-CM | POA: Diagnosis not present

## 2016-08-29 ENCOUNTER — Encounter: Payer: Medicare HMO | Admitting: Vascular Surgery

## 2016-09-04 ENCOUNTER — Ambulatory Visit: Payer: Medicare HMO | Admitting: Internal Medicine

## 2016-09-05 ENCOUNTER — Ambulatory Visit (HOSPITAL_COMMUNITY): Payer: Medicare HMO

## 2016-09-05 ENCOUNTER — Encounter: Payer: Self-pay | Admitting: Vascular Surgery

## 2016-09-05 ENCOUNTER — Ambulatory Visit (INDEPENDENT_AMBULATORY_CARE_PROVIDER_SITE_OTHER): Payer: Self-pay | Admitting: Vascular Surgery

## 2016-09-05 VITALS — BP 110/71 | HR 73 | Temp 97.3°F | Resp 16 | Ht 63.0 in | Wt 220.0 lb

## 2016-09-05 DIAGNOSIS — I82421 Acute embolism and thrombosis of right iliac vein: Secondary | ICD-10-CM

## 2016-09-05 DIAGNOSIS — E782 Mixed hyperlipidemia: Secondary | ICD-10-CM | POA: Insufficient documentation

## 2016-09-05 NOTE — Progress Notes (Signed)
Patient ID: Cassandra [name], female   DOB: 1955/08/05, 61 y.o.   MRN: [id number]  Reason for Consult: New Evaluation (right leg weakness)   Referred by No ref. provider found  Subjective:     HPI:  Cassandra [name] is a 61 y.o. female follows up from recent hospitalization where she underwent pharmacal mechanical thrombectomy of her right lower extremity and iliac veins with balloon angioplasty but no stenting. During that hospitalization she also had acute kidney injury, thrombocytopenia, and leukopenia. She states that her right leg now is feeling fine and she is walking with her only limitation being shortness of breath and she did have a PE in the hospital that was in the periprocedural time.. During the procedure we did not find any significant stenosis in the central venous system. She does have compression stockings but has not been wearing them. She does have minimal swelling of her right lower extremity that is not limiting her activities. She does not have any tissue loss or ulceration or skin changes.  Past Medical History:  Diagnosis Date  . Allergy   . Anxiety   . Complication of anesthesia    difficulty waking up  . Depression   . Dysrhythmia    svt  . GERD (gastroesophageal reflux disease)   . Heart murmur    never seen cardiologist for this  . Hypertension   . Leukocytoclastic vasculitis (HCC)    Dr. Marchelle Gearing and Dr. Corliss Skains  . Migraines    maybe one a year  . Renal disorder   . Renal insufficiency   . Sleep apnea    cpap  . SVT (supraventricular tachycardia) (HCC)   . Wegner's disease (congenital syphilitic osteochondritis)    Family History  Problem Relation Age of Onset  . Clotting disorder Mother 38       Blood clot, foot amputation  . Cervical cancer Mother   . Arthritis Sister   . Heart attack Maternal Grandmother 51  . Breast cancer Sister   . Asthma Cousin    Past Surgical History:  Procedure Laterality Date  . BUNIONECTOMY Bilateral   . CARPAL  TUNNEL RELEASE Right 2002  . JOINT REPLACEMENT Right    knee  . LOWER EXTREMITY VENOGRAPHY Right 07/14/2016   Procedure: Lower Extremity Venography;  Surgeon: Maeola Harman, MD;  [facility] INVASIVE CV LAB;  Service: Cardiovascular;  Laterality: Right;  . PERCUTANEOUS VENOUS THROMBECTOMY,LYSIS WITH INTRAVASCULAR ULTRASOUND (IVUS) Right 07/17/2016   Procedure: PERCUTANEOUS VENOUS THROMBECTOMY AND LYSIS WITH INTRAVASCULAR ULTRASOUND (IVUS) of right lower extermity with balloon angioplasty;  Surgeon: Maeola Harman, MD;  Location: [facility] OR;  Service: Vascular;  Laterality: Right;  . TONSILLECTOMY  08/2002  . TOTAL KNEE ARTHROPLASTY Right   . TOTAL KNEE ARTHROPLASTY Left 02/21/2014   Procedure: LEFT TOTAL KNEE ARTHROPLASTY;  Surgeon: Velna Ochs, MD;  Location: [facility] OR;  Service: Orthopedics;  Laterality: Left;  . TUBAL LIGATION  1980  . ULTRASOUND GUIDANCE FOR VASCULAR ACCESS Right 07/17/2016   Procedure: ULTRASOUND GUIDANCE FOR VASCULAR ACCESS;  Surgeon: Maeola Harman, MD;  Location: [facility] OR;  Service: Vascular;  Laterality: Right;  . VENOGRAM Right 07/17/2016   Procedure: right ASCENDING VENOGRAM WITH ANGIOJET;  Surgeon: Maeola Harman, MD;  Location: [facility] OR;  Service: Vascular;  Laterality: Right;    Short Social History:  Social History  Substance Use Topics  . Smoking status: Never Smoker  . Smokeless tobacco: Never Used  . Alcohol use No  Allergies  Allergen Reactions  . Nitrofurantoin Itching and Shortness Of Breath  . Effexor [Venlafaxine] Hives  . Myrbetriq [Mirabegron] Other (See Comments)    BACK PAIN DRY MOUTH  . Nsaids Other (See Comments) and Hives    Hypersensitivity vasculitis VASCULITIS  . Reglan [Metoclopramide] Other (See Comments)    Blister in mouth   . Clarithromycin     UNSPECIFIED REACTION   . Flagyl [Metronidazole]     UNSPECIFIED REACTION   . Paxil [Paroxetine]     UNSPECIFIED REACTION   . Ampicillin Rash  .  Codeine Other (See Comments)    Head feels like its crawling  . Fluoxetine Hcl Itching    Deep Itch   . Sulfa Antibiotics Other (See Comments) and Nausea And Vomiting    Stomach irritation Stomach irritation  . Tetracycline Nausea And Vomiting    Severe stomach pain    Current Outpatient Prescriptions  Medication Sig Dispense Refill  . calcium carbonate (TUMS - DOSED IN MG ELEMENTAL CALCIUM) 500 MG chewable tablet Chew 1 tablet (200 mg of elemental calcium total) by mouth every 6 (six) hours as needed for indigestion or heartburn.    Marland Kitchen DEXILANT 30 MG capsule Take 1 capsule (30 mg total) by mouth daily. 30 capsule 5  . furosemide (LASIX) 20 MG tablet Take 20 mg by mouth daily.    Marland Kitchen lisinopril (PRINIVIL,ZESTRIL) 5 MG tablet Take 5 mg by mouth daily.    . metoprolol (LOPRESSOR) 25 MG tablet Take 1 tablet twice a day (Patient taking differently: Take 25 mg by mouth 2 (two) times daily. ) 60 tablet o  . ONE TOUCH ULTRA TEST test strip USE 1 STRIP VIA METER THREE TIMES A DAY  2  . predniSONE (DELTASONE) 10 MG tablet Take 30 mg by mouth daily.     . rivaroxaban (XARELTO) 20 MG TABS tablet Take 1 tablet (20 mg total) by mouth daily with supper. 30 tablet 4  . sertraline (ZOLOFT) 100 MG tablet TAKE 1 TABLET BY MOUTH EVERY NIGHT AT BEDTIME 90 tablet 3  . VESICARE 5 MG tablet Take 5 mg by mouth daily.    . cyclophosphamide (CYTOXAN) 50 MG capsule Take 150 mg by mouth daily.    Marland Kitchen Dexlansoprazole 30 MG capsule Take by mouth.    Marland Kitchen lisinopril (PRINIVIL,ZESTRIL) 10 MG tablet     . XARELTO 15 MG TABS tablet TAKE 1 TABLET BY MOUTH TWICE A DAY WITH A MEAL  0   No current facility-administered medications for this visit.     Review of Systems  Constitutional:  Constitutional negative. HENT: HENT negative.  Eyes: Eyes negative.  Respiratory: Positive for shortness of breath.  Cardiovascular: Positive for dyspnea with exertion, irregular heartbeat and leg swelling.  GI: Gastrointestinal negative.    Musculoskeletal: Musculoskeletal negative.  Skin: Skin negative.  Neurological: Neurological negative. Hematologic: Hematologic/lymphatic negative.  Psychiatric: Psychiatric negative.        Objective:  Objective   Vitals:   09/05/16 0944  BP: 110/71  Pulse: 73  Resp: 16  Temp: 97.3 F (36.3 C)  TempSrc: Oral  SpO2: 100%  Weight: 220 lb (99.8 kg)  Height: 5\' 3"  (1.6 m)   Body mass index is 38.97 kg/m.  Physical Exam  Constitutional: She appears well-developed.  HENT:  Head: Normocephalic.  Eyes: Pupils are equal, round, and reactive to light.  Neck: Normal range of motion.  Cardiovascular: Normal rate.   Pulses:      Popliteal pulses are 2+ on the  right side, and 2+ on the left side.       Dorsalis pedis pulses are 2+ on the right side, and 2+ on the left side.  Abdominal: Soft.  Musculoskeletal:  Non pitting edema of rle R calf 42 cm, L 39 (measure 10cm distal to tuberosity)  Neurological: She is alert.  Skin: Skin is warm and dry.  Psychiatric: She has a normal mood and affect. Her behavior is normal. Judgment and thought content normal.    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     Assessment/Plan:     61 year old female follows up for recent pharmacal mechanical thromboembolectomy of her right lower extremity and iliac veins for acute DVT. There is no underlying cause found and during her hospitalization she had several medical comorbidities conditions no oral comp located with pulmonary embolus. She remains on oxygen and this is her greatest limitation to walking. She does have compression stockings but has not been wearing them. I instructed her on wearing compression stockings at least to the knee 20-30 mmHg and she has agreed. Also told her to continue anticoagulation and I'm unsure  of a good stop date given we do not have any indication of the cause of her clotting. She should for my standpoint point be on the Xarelto for at least 6 months. I'll have her follow-up in another 6 months with repeat duplex. In the interim I have recommended walking, compression stocking , and continued anticoagulation. We will repeat her venous duplex at the time.    Maeola HarmanBrandon Christopher Arshi Duarte MD [facility]

## 2016-09-09 NOTE — Addendum Note (Signed)
Addended by: Burton ApleyPETTY, Kainen Struckman A on: 09/09/2016 04:17 PM   Modules accepted: Orders

## 2016-09-22 ENCOUNTER — Other Ambulatory Visit (HOSPITAL_BASED_OUTPATIENT_CLINIC_OR_DEPARTMENT_OTHER): Payer: Self-pay | Admitting: Student

## 2016-09-22 ENCOUNTER — Other Ambulatory Visit: Payer: Self-pay | Admitting: Student

## 2016-10-09 ENCOUNTER — Other Ambulatory Visit: Payer: Self-pay | Admitting: Student

## 2016-10-09 DIAGNOSIS — Z1231 Encounter for screening mammogram for malignant neoplasm of breast: Secondary | ICD-10-CM

## 2016-10-24 ENCOUNTER — Encounter (HOSPITAL_COMMUNITY): Payer: Self-pay | Admitting: Emergency Medicine

## 2016-10-24 ENCOUNTER — Emergency Department (HOSPITAL_COMMUNITY)
Admission: EM | Admit: 2016-10-24 | Discharge: 2016-10-25 | Disposition: A | Discharge status: Home / Self Care | Payer: Medicare HMO | Attending: Emergency Medicine | Admitting: Emergency Medicine

## 2016-10-24 DIAGNOSIS — E860 Dehydration: Secondary | ICD-10-CM | POA: Diagnosis not present

## 2016-10-24 DIAGNOSIS — I471 Supraventricular tachycardia: Secondary | ICD-10-CM | POA: Diagnosis not present

## 2016-10-24 DIAGNOSIS — I10 Essential (primary) hypertension: Secondary | ICD-10-CM | POA: Insufficient documentation

## 2016-10-24 DIAGNOSIS — Z7901 Long term (current) use of anticoagulants: Secondary | ICD-10-CM | POA: Diagnosis not present

## 2016-10-24 DIAGNOSIS — Z79899 Other long term (current) drug therapy: Secondary | ICD-10-CM | POA: Diagnosis not present

## 2016-10-24 DIAGNOSIS — R112 Nausea with vomiting, unspecified: Secondary | ICD-10-CM | POA: Diagnosis not present

## 2016-10-24 DIAGNOSIS — Z96653 Presence of artificial knee joint, bilateral: Secondary | ICD-10-CM | POA: Insufficient documentation

## 2016-10-24 DIAGNOSIS — R002 Palpitations: Secondary | ICD-10-CM | POA: Diagnosis present

## 2016-10-24 MED ORDER — SODIUM CHLORIDE 0.9 % IV BOLUS (SEPSIS)
500.0000 mL | Freq: Once | INTRAVENOUS | Status: AC
  Administered 2016-10-25: 500 mL via INTRAVENOUS

## 2016-10-24 NOTE — ED Provider Notes (Addendum)
[facility]-EMERGENCY DEPT Provider Note   CSN: [id number] Arrival date & time: 10/24/16  2300  Time seen 23:07 PM   History   Chief Complaint Chief Complaint  Patient presents with  . Palpitations    HPI Cassandra [name] is a 61 y.o. female.  HPI  patient has a history of SVT and that she states she's had since about 35. However she was not aware of what was causing her symptoms until about 3 or 4 years ago when she started seeing a cardiologist. She states she has episodes about every 2-3 months and she normally takes metoprolol and it goes away. She states however she saw her kidney doctor last week and her blood pressure was 94/58. He decreased her metoprolol from twice a day to once a day. She states tonight about 7:15 PM she was sitting at her house. She relates acute onset of feeling like her heart was racing. She denies chest pain, shortness of breath, nausea initially. She did get diaphoretic and felt lightheaded and dizzy She took her metoprolol about 7:30 PM and then went to the store to get a straw which she's states last time she breathed into it and it helped her heart rate converted back to sinus rhythm. She states however it did not work and she started getting nauseated and vomited twice, leaving the store and then again in her car. She states she vomited about 15 minutes after taking the metoprolol. She then went to [facility] to get something to eat however she could eat. She states however she was unable to eat so then she went to the grocery store and she vomited again. At this point EMS was called. EMS reports she was in SVT with heart rate 200 on their arrival. She was given adenosine 6 mg and converted to sinus rhythm. She was given 250 mL bolus of normal saline in route. Patient states she feels fine now and feels back to normal. He states they have discussed doing an ablation surgery however she has an autoimmune disorder and her doctors don't want her to have it done at this time.  She states she's scheduled to have an echocardiogram done on August 13.  PCP Carren Rang, PA-C Cardiology Dr Elease Hashimoto  Past Medical History:  Diagnosis Date  . Allergy   . Anxiety   . Complication of anesthesia    difficulty waking up  . Depression   . Dysrhythmia    svt  . GERD (gastroesophageal reflux disease)   . Heart murmur    never seen cardiologist for this  . Hypertension   . Leukocytoclastic vasculitis (HCC)    Dr. Marchelle Gearing and Dr. Corliss Skains  . Migraines    maybe one a year  . Renal disorder   . Renal insufficiency   . Sleep apnea    cpap  . SVT (supraventricular tachycardia) (HCC)   . Wegner's disease (congenital syphilitic osteochondritis)     Patient Active Problem List   Diagnosis Date Noted  . Pulmonary embolism (HCC) 08/12/2016  . AKI (acute kidney injury) (HCC) 07/09/2016  . Dehydration 07/09/2016  . Anemia 07/09/2016  . Prolonged QT interval 07/09/2016  . DVT (deep venous thrombosis) (HCC) 07/09/2016  . Right leg swelling 07/08/2016  . Hemoptysis 03/16/2016  . Positive ANA (antinuclear antibody) 03/15/2016  . 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 History of gross hematuria 02/20/2016  . Primary osteoarthritis of left knee 02/21/2014  . Obesity 02/21/2014  . Primary osteoarthritis of knee 02/21/2014  . SVT (  supraventricular tachycardia) (HCC) 02/17/2014  . Leukocytoclastic vasculitis (HCC)   . 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 Anxiety   . Depression   . GERD (gastroesophageal reflux disease)   . Dyspnea and respiratory abnormality 01/03/2009  . HEPATITIS C 12/07/2008  . VASCULITIS 12/07/2008  . MICROSCOPIC HEMATURIA 12/07/2008  . HEADACHE, CHRONIC 11/30/2008  . COUGH 11/30/2008    Past Surgical History:  Procedure Laterality Date  . BUNIONECTOMY Bilateral   . CARPAL TUNNEL RELEASE Right 2002  . JOINT REPLACEMENT Right    knee  . LOWER EXTREMITY VENOGRAPHY Right 07/14/2016   Procedure: Lower Extremity Venography;   Surgeon: Maeola Harman, MD;  Location: [facility] INVASIVE CV LAB;  Service: Cardiovascular;  Laterality: Right;  . PERCUTANEOUS VENOUS THROMBECTOMY,LYSIS WITH INTRAVASCULAR ULTRASOUND (IVUS) Right 07/17/2016   Procedure: PERCUTANEOUS VENOUS THROMBECTOMY AND LYSIS WITH INTRAVASCULAR ULTRASOUND (IVUS) of right lower extermity with balloon angioplasty;  Surgeon: Maeola Harman, MD;  Location: [facility] OR;  Service: Vascular;  Laterality: Right;  . TONSILLECTOMY  08/2002  . TOTAL KNEE ARTHROPLASTY Right   . TOTAL KNEE ARTHROPLASTY Left 02/21/2014   Procedure: LEFT TOTAL KNEE ARTHROPLASTY;  Surgeon: Velna Ochs, MD;  Location: [facility] OR;  Service: Orthopedics;  Laterality: Left;  . TUBAL LIGATION  1980  . ULTRASOUND GUIDANCE FOR VASCULAR ACCESS Right 07/17/2016   Procedure: ULTRASOUND GUIDANCE FOR VASCULAR ACCESS;  Surgeon: Maeola Harman, MD;  Location: [facility] OR;  Service: Vascular;  Laterality: Right;  . VENOGRAM Right 07/17/2016   Procedure: right ASCENDING VENOGRAM WITH ANGIOJET;  Surgeon: Maeola Harman, MD;  Location: [facility] OR;  Service: Vascular;  Laterality: Right;    OB History    No data available       Home Medications    Prior to Admission medications   Medication Sig Start Date End Date Taking? Authorizing Provider  atorvastatin (LIPITOR) 20 MG tablet Take 20 mg by mouth daily. 09/05/16 09/05/17 Yes [provider]  calcium carbonate (TUMS - DOSED IN MG ELEMENTAL CALCIUM) 500 MG chewable tablet Chew 1 tablet (200 mg of elemental calcium total) by mouth every 6 (six) hours as needed for indigestion or heartburn. 07/23/16  Yes Kathlen Mody, MD  DEXILANT 30 MG capsule Take 1 capsule (30 mg total) by mouth daily. 08/12/16  Yes Parrett, Tammy S, NP  furosemide (LASIX) 20 MG tablet Take 20 mg by mouth daily. 06/25/16  Yes [provider]  lisinopril (PRINIVIL,ZESTRIL) 5 MG tablet Take 5 mg by mouth daily.   Yes [provider]  metoprolol  (LOPRESSOR) 25 MG tablet Take 1 tablet twice a day Patient taking differently: Take 25 mg by mouth daily.  04/15/16  Yes Jacalyn Lefevre, MD  mycophenolate (CELLCEPT) 500 MG tablet Take 1,000 mg by mouth 2 (two) times daily. 10/23/16  Yes [provider]  ondansetron (ZOFRAN-ODT) 4 MG disintegrating tablet Take 4 mg by mouth every 6 (six) hours as needed for nausea.  10/22/16  Yes [provider]  predniSONE (DELTASONE) 5 MG tablet Take 5 mg by mouth daily with breakfast.   Yes [provider]  rivaroxaban (XARELTO) 20 MG TABS tablet Take 1 tablet (20 mg total) by mouth daily with supper. 08/12/16  Yes Parrett, Tammy S, NP  sertraline (ZOLOFT) 100 MG tablet TAKE 1 TABLET BY MOUTH EVERY NIGHT AT BEDTIME 01/31/14  Yes Donita Brooks, MD  VESICARE 5 MG tablet Take 5 mg by mouth daily. 04/14/16  Yes [provider]  ONE TOUCH ULTRA TEST test  strip USE 1 STRIP VIA METER THREE TIMES A DAY 08/14/16   [provider]    Family History Family History  Problem Relation Age of Onset  . Clotting disorder Mother [id number]       Blood clot, foot amputation  . Cervical cancer Mother   . Arthritis Sister   . Heart attack Maternal Grandmother 51  . Breast cancer Sister   . Asthma Cousin     Social History Social History  Substance Use Topics  . Smoking status: Never Smoker  . Smokeless tobacco: Never Used  . Alcohol use No  lives at home Lives with sister   Allergies   Nitrofurantoin; Effexor [venlafaxine]; Myrbetriq [mirabegron]; Nsaids; Reglan [metoclopramide]; Clarithromycin; Flagyl [metronidazole]; Paxil [paroxetine]; Ampicillin; Codeine; Fluoxetine hcl; Sulfa antibiotics; and Tetracycline   Review of Systems Review of Systems   Physical Exam Updated Vital Signs BP (!) 87/61 (BP Location: Right Arm)   Pulse 92   Temp 98.8 F (37.1 C) (Oral)   Resp 20   Ht 5\' 3"  (1.6 m)   Wt 99.8 kg (220 lb)   SpO2 97%   BMI 38.97 kg/m   Vital signs normal  except hypotension   Physical Exam   ED Treatments / Results  Labs (all labs ordered are listed, but only abnormal results are displayed) Results for orders placed or performed during the hospital encounter of 10/24/16  Comprehensive metabolic panel  Result Value Ref Range   Sodium 138 135 - 145 mmol/L   Potassium 3.7 3.5 - 5.1 mmol/L   Chloride 103 101 - 111 mmol/L   CO2 23 22 - 32 mmol/L   Glucose, Bld 107 (H) 65 - 99 mg/dL   BUN 15 6 - 20 mg/dL   Creatinine, Ser 1.611.28 (H) 0.44 - 1.00 mg/dL   Calcium 9.1 8.9 - 09.610.3 mg/dL   Total Protein 6.2 (L) 6.5 - 8.1 g/dL   Albumin 3.6 3.5 - 5.0 g/dL   AST 21 15 - 41 U/L   ALT 15 14 - 54 U/L   Alkaline Phosphatase 84 38 - 126 U/L   Total Bilirubin 0.8 0.3 - 1.2 mg/dL   GFR calc non Af Amer 44 (L) >60 mL/min   GFR calc Af Amer 51 (L) >60 mL/min   Anion gap 12 5 - 15  CBC with Differential  Result Value Ref Range   WBC 7.6 4.0 - 10.5 K/uL   RBC 3.07 (L) 3.87 - 5.11 MIL/uL   Hemoglobin 10.0 (L) 12.0 - 15.0 g/dL   HCT 04.529.5 (L) 40.936.0 - 81.146.0 %   MCV 96.1 78.0 - 100.0 fL   MCH 32.6 26.0 - 34.0 pg   MCHC 33.9 30.0 - 36.0 g/dL   RDW 91.414.9 78.211.5 - 95.615.5 %   Platelets 163 150 - 400 K/uL   Neutrophils Relative % 90 %   Neutro Abs 6.8 1.7 - 7.7 K/uL   Lymphocytes Relative 3 %   Lymphs Abs 0.2 (L) 0.7 - 4.0 K/uL   Monocytes Relative 7 %   Monocytes Absolute 0.5 0.1 - 1.0 K/uL   Eosinophils Relative 0 %   Eosinophils Absolute 0.0 0.0 - 0.7 K/uL   Basophils Relative 0 %   Basophils Absolute 0.0 0.0 - 0.1 K/uL  Magnesium  Result Value Ref Range   Magnesium 2.1 1.7 - 2.4 mg/dL   Laboratory interpretation all normal except Mild anemia, new renal insufficiency   EKG  EKG Interpretation None      EMS rhythm strip  while giving adenosine.     Radiology No results found.  Procedures Procedures (including critical care time)  Medications Ordered in ED Medications  sodium chloride 0.9 % bolus 500 mL (0 mLs Intravenous Stopped  10/25/16 0120)  sodium chloride 0.9 % bolus 500 mL (0 mLs Intravenous Stopped 10/25/16 0223)     Initial Impression / Assessment and Plan / ED Course  I have reviewed the triage vital signs and the nursing notes.  Pertinent labs & imaging results that were available during my care of the patient were reviewed by me and considered in my medical decision making (see chart for details).   patient's blood pressure was low during my exam. She was given IV fluid bolus. Laboratory testing was ordered.`  1:45 AM After reviewing her laboratory results patient was noted to have a new renal insufficiency. She was given more IV fluids.  1:55 AM she states her nausea is gone. She is willing to try to drink fluids. We discussed her test results. She is remained in normal sinus rhythm.  2:50 AM patient states she has been drinking fluids without difficulty. She was felt stable for discharge. Blood pressure has improved to 106/57.  Patient presents with a episode of SVT which she's had off and on for many years. It was complicated by nausea and vomiting. EMS converted her to normal sinus rhythm with adenosine. She had a mild renal insufficiency which was treated with IV fluids and oral fluids in the ED. She was felt stable for discharge to follow up with her cardiologist.     Final Clinical Impressions(s) / ED Diagnoses   Final diagnoses:  SVT (supraventricular tachycardia) (HCC)  Dehydration  Non-intractable vomiting with nausea, unspecified vomiting type    Plan discharge  Devoria Albe, MD, Concha Pyo, MD 10/25/16 1610    Devoria Albe, MD 10/25/16 [phone]

## 2016-10-24 NOTE — ED Triage Notes (Signed)
Brought by ems for sudden onset of palpitations while shopping this evening.  Per ems patient in SVT with rate of 200 on their arrival.  Adenosine 6mg  given en route.  Converted to SR.  Denies any pain at this time.  Did vomit X 1 en route and was diaphoretic initially.  NS 250ml given by ems.

## 2016-10-25 LAB — COMPREHENSIVE METABOLIC PANEL
ALT: 15 U/L (ref 14–54)
AST: 21 U/L (ref 15–41)
Albumin: 3.6 g/dL (ref 3.5–5.0)
Alkaline Phosphatase: 84 U/L (ref 38–126)
Anion gap: 12 (ref 5–15)
BILIRUBIN TOTAL: 0.8 mg/dL (ref 0.3–1.2)
BUN: 15 mg/dL (ref 6–20)
CALCIUM: 9.1 mg/dL (ref 8.9–10.3)
CO2: 23 mmol/L (ref 22–32)
CREATININE: 1.28 mg/dL — AB (ref 0.44–1.00)
Chloride: 103 mmol/L (ref 101–111)
GFR calc Af Amer: 51 mL/min — ABNORMAL LOW (ref >60)
GFR, EST NON AFRICAN AMERICAN: 44 mL/min — AB (ref >60)
Glucose, Bld: 107 mg/dL — ABNORMAL HIGH (ref 65–99)
Potassium: 3.7 mmol/L (ref 3.5–5.1)
Sodium: 138 mmol/L (ref 135–145)
Total Protein: 6.2 g/dL — ABNORMAL LOW (ref 6.5–8.1)

## 2016-10-25 LAB — CBC WITH DIFFERENTIAL/PLATELET
BASOS PCT: 0 %
Basophils Absolute: 0 10*3/uL (ref 0.0–0.1)
EOS ABS: 0 10*3/uL (ref 0.0–0.7)
Eosinophils Relative: 0 %
HEMATOCRIT: 29.5 % — AB (ref 36.0–46.0)
Hemoglobin: 10 g/dL — ABNORMAL LOW (ref 12.0–15.0)
Lymphocytes Relative: 3 %
Lymphs Abs: 0.2 10*3/uL — ABNORMAL LOW (ref 0.7–4.0)
MCH: 32.6 pg (ref 26.0–34.0)
MCHC: 33.9 g/dL (ref 30.0–36.0)
MCV: 96.1 fL (ref 78.0–100.0)
MONO ABS: 0.5 10*3/uL (ref 0.1–1.0)
MONOS PCT: 7 %
Neutro Abs: 6.8 10*3/uL (ref 1.7–7.7)
Neutrophils Relative %: 90 %
PLATELETS: 163 10*3/uL (ref 150–400)
RBC: 3.07 MIL/uL — ABNORMAL LOW (ref 3.87–5.11)
RDW: 14.9 % (ref 11.5–15.5)
WBC: 7.6 10*3/uL (ref 4.0–10.5)

## 2016-10-25 LAB — MAGNESIUM: MAGNESIUM: 2.1 mg/dL (ref 1.7–2.4)

## 2016-10-25 MED ORDER — SODIUM CHLORIDE 0.9 % IV BOLUS (SEPSIS)
500.0000 mL | Freq: Once | INTRAVENOUS | Status: AC
  Administered 2016-10-25: 500 mL via INTRAVENOUS

## 2016-10-25 NOTE — ED Notes (Signed)
Patient drinking water at this time.  

## 2016-10-25 NOTE — Discharge Instructions (Signed)
Drink plenty of fluids so that you well know longer be dehydrated. Please let Dr.[name]'s office know that you came to the ED tonight. If your heart starts racing again take your metoprolol and stay home and sit on the couch or in a chair. If your heart rate is not improved in an hour take another metoprolol. If that does not work call 911. Please do not drive if you're feeling lightheaded or dizzy.

## 2016-10-27 ENCOUNTER — Other Ambulatory Visit: Payer: Self-pay

## 2016-10-27 ENCOUNTER — Ambulatory Visit (HOSPITAL_COMMUNITY): Payer: Medicare HMO | Attending: Interventional Cardiology

## 2016-10-27 DIAGNOSIS — G4730 Sleep apnea, unspecified: Secondary | ICD-10-CM | POA: Insufficient documentation

## 2016-10-27 DIAGNOSIS — R531 Weakness: Secondary | ICD-10-CM | POA: Diagnosis not present

## 2016-10-27 DIAGNOSIS — I10 Essential (primary) hypertension: Secondary | ICD-10-CM | POA: Diagnosis not present

## 2016-10-27 DIAGNOSIS — I2699 Other pulmonary embolism without acute cor pulmonale: Secondary | ICD-10-CM | POA: Insufficient documentation

## 2016-10-27 DIAGNOSIS — R011 Cardiac murmur, unspecified: Secondary | ICD-10-CM | POA: Insufficient documentation

## 2016-10-27 DIAGNOSIS — I081 Rheumatic disorders of both mitral and tricuspid valves: Secondary | ICD-10-CM | POA: Insufficient documentation

## 2016-10-27 NOTE — Progress Notes (Unsigned)
Before I took Cassandra [name]'s blood pressure, she stated that her blood pressure runs low and that her kidney specialist is aware of it. Today her blood pressure is 89/62.

## 2016-10-29 ENCOUNTER — Telehealth: Payer: Self-pay | Admitting: Cardiovascular Disease

## 2016-10-29 ENCOUNTER — Ambulatory Visit
Admission: RE | Admit: 2016-10-29 | Discharge: 2016-10-29 | Disposition: A | Discharge status: Home / Self Care | Payer: Medicare HMO | Source: Ambulatory Visit | Attending: Student | Admitting: Student

## 2016-10-29 DIAGNOSIS — I471 Supraventricular tachycardia: Secondary | ICD-10-CM

## 2016-10-29 DIAGNOSIS — Z1231 Encounter for screening mammogram for malignant neoplasm of breast: Secondary | ICD-10-CM | POA: Insufficient documentation

## 2016-10-29 NOTE — Telephone Encounter (Signed)
Agree with referral to EP

## 2016-10-29 NOTE — Telephone Encounter (Signed)
New message      Pt has decided to have the ablation.  Please call and let her know what she needs to do

## 2016-10-29 NOTE — Telephone Encounter (Signed)
Spoke with patient who states she had another episode of SVT on Friday 8/10 which makes 4 episodes this year. She states her HR was 205 bpm on Friday. I advised her that I will send a message to our EP scheduler and she will receive a call from our office to schedule. Patient verbalized understanding and agreement and thanked me for the call.

## 2016-11-13 ENCOUNTER — Ambulatory Visit (INDEPENDENT_AMBULATORY_CARE_PROVIDER_SITE_OTHER): Payer: Medicare HMO | Admitting: Internal Medicine

## 2016-11-13 ENCOUNTER — Telehealth: Payer: Self-pay | Admitting: Internal Medicine

## 2016-11-13 ENCOUNTER — Ambulatory Visit (INDEPENDENT_AMBULATORY_CARE_PROVIDER_SITE_OTHER)
Admission: RE | Admit: 2016-11-13 | Discharge: 2016-11-13 | Disposition: A | Discharge status: Home / Self Care | Payer: Medicare HMO | Source: Ambulatory Visit | Attending: Internal Medicine | Admitting: Internal Medicine

## 2016-11-13 ENCOUNTER — Encounter: Payer: Self-pay | Admitting: Internal Medicine

## 2016-11-13 VITALS — BP 112/72 | HR 67 | Ht 63.0 in | Wt 220.0 lb

## 2016-11-13 DIAGNOSIS — I7782 Antineutrophilic cytoplasmic antibody (ANCA) vasculitis: Secondary | ICD-10-CM

## 2016-11-13 DIAGNOSIS — I776 Arteritis, unspecified: Secondary | ICD-10-CM

## 2016-11-13 DIAGNOSIS — R0600 Dyspnea, unspecified: Secondary | ICD-10-CM | POA: Diagnosis not present

## 2016-11-13 DIAGNOSIS — I2699 Other pulmonary embolism without acute cor pulmonale: Secondary | ICD-10-CM | POA: Diagnosis not present

## 2016-11-13 DIAGNOSIS — R0689 Other abnormalities of breathing: Secondary | ICD-10-CM | POA: Diagnosis not present

## 2016-11-13 NOTE — Telephone Encounter (Addendum)
Hi [name]  sAw Cassandra MathRhonda L Seales 11/13/2016 -0 recent echo shows PASP 40 - prob relatd to PE May 2018. I think ok to monitor clinically. Just fyi  THanks  Dr. [name], M.D., [facility].C.P Pulmonary and Critical Care Medicine Staff Physician [facility] [facility] Pager: [phone], If no answer or between  15:00h - 7:00h: call [phone]  11/13/2016 12:48 PM

## 2016-11-13 NOTE — Patient Instructions (Addendum)
ICD-10-CM   1. 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 Dyspnea and respiratory abnormality R06.00    R06.89   3. Other pulmonary embolism without acute cor pulmonale, unspecified chronicity (HCC) I26.99    VAsculitis:   - per Dr Marshell LevanKalluru [facility] Pulmonary embolisum May 2018:   -continue xarelto.  - pressurs slightly high on echo and we will keep an eye on this  - pain in chest probably related to the PE episode - will do CXR 2 view 11/13/2016 and call with results  - you might not need o2 but continue for now till heart issues sorted  Out  Shorness of breath   - due to weight and all of above issues  Followup  - 6 months or sooner if needed; repeat walk test

## 2016-11-13 NOTE — Progress Notes (Signed)
Subjective:     Patient ID: Cassandra [name], female   DOB: 1955/06/27, 61 y.o.   MRN: [id number]  HPI  OV 08/09/2013  Chief Complaint  Patient presents with  . Pulomary Consult    Transition of care from MW to MR. C/o SOB with activity but since on pred pt is able to do more activity wihtout becoming dyspneic. Denies CP and cough.     Very poor historian. Significant cotton dust exposuire +  Says in 2010 had episodic dyspnea, cough, chest soreness that Rx with intermittent prednisone. REcollects pulmonary eval at that time. Details not clear. Denies having had bronch. Unclear how long she took prednisone.  However, in review of Dr Estanislado Pandy notes patient had bronchitis in dec 2010 and then developed rash (LE, UE) after levaquin. 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 Apparently at that time per PCP notes Labs at that time were ANA negative, ESR of 44, P-ANCA 1:160, MPO abs 27.5, PR-3 abs 4.9  Were all the positive ones. She had microscopic hematuria but normal creat 11/30/2008 (result in epic) SHe followed with DR Halford Chessman, renal and even pulmnary [facility] and derm at [facility] (Derm notes 2011 at [facility] reviewed). It appears she took prednisone for several months and then stopped. She then felt well and Symptoms resolved by 2012 and was doing well. Also lost to followup due to loss of insurance. [facility] derm noets 08/23/2009 indicated that in Sept 2010: "HEpc serologies positive but Hep C Quantitative negative" and esr 76, crp 28 p-anca 1:160, mpo, pr3 abs + c-anca neg . However, she had neck rash at that time and they thought twas "urticaria Multiforme""    Then in 03/16/13 developed acute left red eyde with swelling and then since then intermittent recurrence of dyspnea, dry cough, chest soreness with arthralgia.  She was initally seen in urgent care who had her follow with DR Dennard Schaumann which she did on 05/13/13. He ordered autoimmune profile and noted ESR 121, ANA at 1:640 and P-ANCA at 1:80 but normal complements  and DS-DNA. He was concerned about "SLE "flare up /r recurrence and placed her on prednisone.    He followed her again 05/20/13 and noted cough resolved.  She saw Der Melvyn Novas 05/24/13 who placed her on PPI with pred taper to off.  HE shared concern for vasculitis given lab results but deferred to [facility] because patient was set up for [facility]. She saw PCP [facility] TOM, MD on 06/02/13 and was off prednisone at this time and noted to have cough, congestion, dyspnea, sinus issues and right pleuristy.  CXR was clear (personally reviewed)  On Exam she was Wheezing and had rales.  HE treated with levaquin but she did not improve. HE gave her prednisone and wheezing resolved and she felt well without wheeze when he saw her again 06/09/13   She then followed at [facility] Dr Idamae Schuller: a tthis time off prednisone for 10 days. She was very poor historian at this visit but her cough had recurred with clear sputum with fatigue, itchy skin, mouth sores, conjunctivies and along with symptoms of slep anpnea. Also, admitted to severe GERD and sinus congestion. PFT at this visit was essentialy normal.    She has needed repeated and intermittent prednisone courses. REports this is only thing that helps. Has seen Dr Idamae Schuller at [facility] when off prednisone but PFT near normal and apparently she has sleep study pending there. SHe has been told off different autoimmune disease at urgent care and other places that she  is confused. Currently back on prednisone. Dr Estanislado Pandy has referred her to me so she can have a local pulmonologist but she is ok with just following at one place ([facility] [place] v both). She is frustrated she is seeing many docs but at same time has very poor insight into her problems    Autoimmune May 14, 2013 - no on seroids  P  ANCA 1:80 and positive, ANA 1:640, ESR 121 (was 19 in Jan 2015)  DS-DNA, C Anca and complement - normal - feb 2015   UA 06/02/13 - just came off steroids   - no hematuria  PFT June 28, 2013 at  [facility] - done not on prednisone  - FVC 2.8L/94%, TLC 3.4/70%, DLCO 15.4/82%. Spirometry with flow volume loops demonstrates no airway obstruction. Lung  volumes are consistent with mild restrictive lung disease. The diffusing  capacity for carbon monoxide, a reflection of alveolar-capillary gas  transport, is normal.   CBC 2010 t0 April 2015: eos 2,6%, 3% and 0%  Reviewed Dr Ralene Cork notes from 07/22/13  Autoimmune labs 07/22/13  -P-ANCA, +ve @ 1:640, MPO > 8, ESR 49, ANA positive  @ 1:640, Cardiolipin IgM - 17 and high - C-ANCA, PR-3, CK 31, HIV , RF ,DSDNA, CCp, ssA, ssB, ENA, RNP, scl-70  - ALL negative - Hep C anitbody - REACTIVE.   - HepBSag - negative, Quantiferon Gold - Negative  CT chest 08/03/13 - done on  2 weeks pf prednisone EXAM:  CT CHEST WITHOUT CONTRAST  TECHNIQUE: CT chest 08/03/13 Multidetector CT imaging of the chest was performed following the  standard protocol without intravenous contrast. High resolution  imaging of the lungs, as well as inspiratory and expiratory imaging,  was performed.  COMPARISON: 12/04/2008.  FINDINGS:  No pathologically enlarged mediastinal lymph nodes. Hilar regions  are difficult to definitively evaluate but appear grossly  unremarkable. No axillary adenopathy. Heart size within normal  limits. There may be a small amount of pericardial fluid or  thickening, new from the prior exam. Small hiatal hernia.  Image quality is somewhat degraded by respiratory motion. Very mild  subpleural reticulation is seen in the right lung, without definite  zonal predominance. Very minimal involvement of the lingula and left  lower lobe (example series 8 and image 21). Findings are unchanged  from 12/04/2008. No traction bronchiectasis/ bronchiolectasis,  ground-glass, architectural distortion or honeycombing. No air  trapping on inspiratory and expiratory imaging. No pleural fluid.  Airway is unremarkable.  Incidental imaging of the upper abdomen  shows the visualized  portions of the liver, gallbladder, adrenal glands, kidneys, spleen,  pancreas, stomach and bowel to be grossly unremarkable. No upper  abdominal adenopathy. No worrisome lytic or sclerotic lesions.  Degenerative changes are seen in the spine.  IMPRESSION:  1. Minimal subpleural reticulation, right greater than left,  unchanged from 12/04/2008. 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  2. Question small amount of pericardial fluid or thickening, new.  Electronically Signed  By: Lorin Picket M.D.  On: 08/03/2013 12:07      ASSESSMENT Tough case. Poor history and intermittent steroids during data point eval for pulmonary making it hard to arrive at specific pulmonary diagnosis. However, serial data at 3 points (2010, feb 2015, may 2015) clearly demonstrate when symptomatic with respiratory issues her ESR is high, she is P-ANCA screen positive and c/w wit this MPO antibody positive. Her symptoms respond to steroids. Taken together, strongly c/w MPA disease - Vasculitis.  However, fact April 2015 PFT at [facility] was normal (done between steroid courses), and CT Chest 08/03/13 showed very mild early and subtle ILD (so subtle not reflected in PFT) that is unchanged since 2010. This suggests pulmonary symptoms are airway related and c/w this she ws noticed to have wheezing in march 2015 PCP visit.  Unfortunately we do not have PFT when symptoms were active ([facility] PFt April 2015, done when 10 days off prednisone so not fully reflective of being off prednisone)  DDx is cryoglobulinemia (prior leukocytoclastic vasculitis and Hep C positive), Erick Alley - I think these are unlikely but will leave it to Dr Bronson Curb to make call on this   Rx PLAN  - It would be nice to have tissue diagnosis of vasculitis but will need it from nose or airway when symptomatic (these sites are @ risk for false negative). Or, will have to biopsy from renal or lungs if she  were to worsen  -  Would continue prednisone -given obesity will be nice to find the lowest floor dose; will let Dr Estanislado Pandy know this   - ? Does she need 2nd immunomodulator. In 2015: Given no frank alveolar hemorrhage, stable creatinine, no hematuria, normal PFT, CT showing stable ILD since 2010 (though lot of these data points have been elicited with her on prednisone or off prednisone for several days only) - perhaps no role for rituxan/cytoxan but for methotrexate  - we can consolidate followup here at [place]   OV 09/12/2013  Chief Complaint  Patient presents with  . Follow-up    Pt states her breathing is doing better. C/o intermittent dyspnea with activity. Denies cough and CP.     dyspnea better with prednisone but worse when pred tapered. Class 2 doe. Wants to increase prednisone. No new issues. Continues to be a poort historian. Has sleep apnea workup pending at [place] 11/16/2013  Chief Complaint  Patient presents with  . Follow-up    Pt states her breathing is unchanged since last OV. Pt c/o DOE, mild dry cough. Pt denies CP/tightness.     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 This is being managed by her rheumatologist. Looks like she's also been started on injection methotrexate which is currently on hold because she's having some dental work at [employer]. Her dyspnea continues even though it is slightly better with prednisone. She has a sense that her obesity with a body mass index of 42 is playing a role and dyspnea. She is trying to lose weight and try to exercise more but is struggling with executing this. She had sleep apnea workup at [facility] and has been diagnosed with moderate sleep apnea. She is on CPAP. She wants to switch sleep apnea care to [place] @ [place] due to logistical reasons  Otherwise no new problems   OV  02/20/2016  Chief Complaint  Patient presents with  . Acute Visit    SOB and chest pain since September. Having some right-sided pain that has been on and off since Sept.     Follow-up multifactorial dyspnea associated with obesity in the setting of mild-moderate anca vasculitis   Last seen September 2015. At that time she was on methotrexate and prednisone for her rheumatologist Dr. Bronson Curb after that she failed to follow-up with her. She's been off immunomodulators for the last 2 years according to her history.  She says she has not had any new interval medical diagnosis although July 2017 according to her and review of the chart she had gross hematuria and was treated with antibiotics in the emergency department and it resolved. Then somewhere in August/September 2017 she started working out at [employer] and started developing some low backache/right lower chest pain in the back. This resolved after she stopped exercising. Now in the last few weeks she's been exercising in the gym. She basically only doing her mild aerobic walking exercises. She says she has new onset dyspnea that is worse with exertion and relieved by rest. Moderate in intensity. There is some associated cough. There is no associated pain. This no wheezing or fever or chills. She denies any hematuria.    OV 11/13/2016  Chief Complaint  Patient presents with  . Follow-up    Pt here for 3 month ROV. Pt states her breathing has improved since last OV. Pt denies cough, CP/tightness, f/c/s.     Follow-up multifactorial dyspnea associated with obesity in the setting of mild-moderate anca vasculitis   Last visit was December 2017 with me. At that time her last lites panel and autoimmune panel was strongly positive. But she refused to follow-up. Then it appears in the spring of 2080 she ended up with a bad DVT in her right lower extremity and a pulmonary embolism and she's been on anticoagulation since. She tells me that she was  on prednisone and Cytoxan with a nephrologist in the [place] area. But now she is on prednisone and CellCept. Apparently she is happy with the management. She feels the vasculitis is under control. Nevertheless she is having some shortness of breath with exertion especially after the pulmonary embolism episode. She also has some right lower lobe musculoskeletal pain with deep breath. But is. There is no wheezing or chills or fever. This no hematuria. She says she gets tachycardic and she might have an ablation with electrophysiology. She did have an echocardiogram which shows pulmonary artery systolic pressure 40 this is in August 2018 f. This is done as follow-up for her pulmonary embolism. Walking desaturation test 185 feet 3 laps on room air: Show only walk 2 laps and got very short of breath she got tachycardic and stopped   Did not desaturate. STiopped at 2 laps - due to HR 138 and goet dyspneaic   has a past medical history of Allergy; Anxiety; Complication of anesthesia; Depression; Dysrhythmia; GERD (gastroesophageal reflux disease); Heart murmur; Hypertension; Leukocytoclastic vasculitis ([place]); Migraines; Renal disorder; Renal insufficiency; Sleep apnea; SVT (supraventricular tachycardia) ([place]); and Wegner's disease (congenital syphilitic osteochondritis).   reports that she has never smoked. She has never used smokeless tobacco.  Past Surgical History:  Procedure Laterality Date  . BUNIONECTOMY Bilateral   . CARPAL TUNNEL RELEASE Right 2002  . JOINT REPLACEMENT Right    knee  . LOWER EXTREMITY VENOGRAPHY Right 07/14/2016   Procedure: Lower Extremity Venography;  Surgeon: Waynetta Sandy, MD;  Location: [facility];  Service: Cardiovascular;  Laterality: Right;  . PERCUTANEOUS VENOUS THROMBECTOMY,LYSIS WITH INTRAVASCULAR ULTRASOUND (IVUS) Right 07/17/2016   Procedure: PERCUTANEOUS VENOUS THROMBECTOMY AND LYSIS WITH INTRAVASCULAR ULTRASOUND (IVUS) of right lower extermity with  balloon angioplasty;  Surgeon: Waynetta Sandy, MD;  Location: [facility] OR;  Service: Vascular;  Laterality: Right;  . TONSILLECTOMY  08/2002  . TOTAL KNEE ARTHROPLASTY Right   . TOTAL KNEE ARTHROPLASTY Left 02/21/2014   Procedure: LEFT TOTAL KNEE ARTHROPLASTY;  Surgeon: Hessie Dibble, MD;  Location: [facility];  Service: Orthopedics;  Laterality: Left;  . TUBAL LIGATION  1980  . ULTRASOUND GUIDANCE FOR VASCULAR ACCESS Right 07/17/2016   Procedure: ULTRASOUND GUIDANCE FOR VASCULAR ACCESS;  Surgeon: Waynetta Sandy, MD;  Location: [place];  Service: Vascular;  Laterality: Right;  . VENOGRAM Right 07/17/2016   Procedure: right ASCENDING VENOGRAM WITH ANGIOJET;  Surgeon: Waynetta Sandy, MD;  Location: [place];  Service: Vascular;  Laterality: Right;    Allergies  Allergen Reactions  . Nitrofurantoin Itching and Shortness Of Breath  . Effexor [Venlafaxine] Hives  . Myrbetriq [Mirabegron] Other (See Comments)    BACK PAIN DRY MOUTH  . Nsaids Other (See Comments) and Hives    Hypersensitivity vasculitis VASCULITIS  . Reglan [Metoclopramide] Other (See Comments)    Blister in mouth   . Clarithromycin     UNSPECIFIED REACTION   . Flagyl [Metronidazole]     UNSPECIFIED REACTION   . Paxil [Paroxetine]     UNSPECIFIED REACTION   . Ampicillin Rash  . Codeine Other (See Comments)    Head feels like its crawling  . Fluoxetine Hcl Itching    Deep Itch   . Sulfa Antibiotics Other (See Comments) and Nausea And Vomiting    Stomach irritation Stomach irritation  . Tetracycline Nausea And Vomiting    Severe stomach pain   Continue on the results on that and the flyer" was in the front desk in early dementia. He Immunization History  Administered Date(s) Administered  . Influenza Split 11/27/2011, 12/15/2012, 11/25/2013  . Influenza,inj,Quad PF,6+ Mos 02/20/2016  . Pneumococcal Polysaccharide-23 11/15/2013  . Td 12/16/2003    Family History  Problem Relation Age of Onset  .  Clotting disorder Mother 72       Blood clot, foot amputation  . Cervical cancer Mother   . Arthritis Sister   . Heart attack Maternal Grandmother 51  . Breast cancer Sister 49  . Asthma Cousin      Current Outpatient Prescriptions:  .  atorvastatin (LIPITOR) 20 MG tablet, Take 20 mg by mouth daily., Disp: , Rfl:  .  cetirizine (ZYRTEC) 10 MG tablet, Take 10 mg by mouth daily., Disp: , Rfl:  .  Cholecalciferol (VITAMIN D3) 1000 units CAPS, Take by mouth daily., Disp: , Rfl:  .  DEXILANT 30 MG capsule, Take 1 capsule (30 mg total) by mouth daily., Disp: 30 capsule, Rfl: 5 .  docusate sodium (COLACE) 100 MG capsule, Take 100 mg by mouth daily., Disp: , Rfl:  .  furosemide (LASIX) 20 MG tablet, Take 20 mg by mouth daily., Disp: , Rfl:  .  metoprolol (LOPRESSOR) 25 MG tablet, Take 1 tablet twice a day (Patient taking differently: Take 25 mg by mouth daily. ), Disp: 60 tablet, Rfl: o .  mycophenolate (CELLCEPT) 500 MG tablet, Take 1,000 mg by mouth 2 (two) times daily., Disp: , Rfl:  .  ondansetron (ZOFRAN-ODT) 4 MG disintegrating tablet, Take 4 mg by mouth every 6 (six) hours as needed for nausea. , Disp: , Rfl:  .  ONE TOUCH ULTRA TEST test strip, USE 1 STRIP VIA METER THREE TIMES A DAY, Disp: , Rfl: 2 .  PAPAYA PO, Take by mouth daily., Disp: , Rfl:  .  Potassium (POTASSIMIN PO), Take by mouth. OTC, Disp: , Rfl:  .  predniSONE (DELTASONE) 5 MG tablet, Take 5 mg by mouth daily with breakfast., Disp: , Rfl:  .  rivaroxaban (XARELTO) 20 MG TABS tablet, Take 1 tablet (20 mg  total) by mouth daily with supper., Disp: 30 tablet, Rfl: 4 .  sertraline (ZOLOFT) 100 MG tablet, TAKE 1 TABLET BY MOUTH EVERY NIGHT AT BEDTIME, Disp: 90 tablet, Rfl: 3 .  VESICARE 5 MG tablet, Take 5 mg by mouth daily., Disp: , Rfl:    Review of Systems     Objective:   Physical Exam  Constitutional: She is oriented to person, place, and time. She appears well-developed and well-nourished. No distress.  HENT:  Head:  Normocephalic and atraumatic.  Right Ear: External ear normal.  Left Ear: External ear normal.  Mouth/Throat: Oropharynx is clear and moist. No oropharyngeal exudate.  o2 on  Eyes: Pupils are equal, round, and reactive to light. Conjunctivae and EOM are normal. Right eye exhibits no discharge. Left eye exhibits no discharge. No scleral icterus.  Neck: Normal range of motion. Neck supple. No JVD present. No tracheal deviation present. No thyromegaly present.  Cardiovascular: Normal rate, regular rhythm, normal heart sounds and intact distal pulses.  Exam reveals no gallop and no friction rub.   No murmur heard. Pulmonary/Chest: Effort normal and breath sounds normal. No respiratory distress. She has no wheezes. She has no rales. She exhibits no tenderness.  Abdominal: Soft. Bowel sounds are normal. She exhibits no distension and no mass. There is no tenderness. There is no rebound and no guarding.  Musculoskeletal: Normal range of motion. She exhibits no edema or tenderness.  Lymphadenopathy:    She has no cervical adenopathy.  Neurological: She is alert and oriented to person, place, and time. She has normal reflexes. No cranial nerve deficit. She exhibits normal muscle tone. Coordination normal.  Skin: Skin is warm and dry. No rash noted. She is not diaphoretic. No erythema. No pallor.  Psychiatric: She has a normal mood and affect. Her behavior is normal. Judgment and thought content normal.  Vitals reviewed.  Vitals:   11/13/16 1230  BP: 112/72  Pulse: 67  SpO2: 98%  Weight: 220 lb (99.8 kg)  Height: 5' 3" (1.6 m)    Estimated body mass index is 38.97 kg/m as calculated from the following:   Height as of this encounter: 5' 3" (1.6 m).   Weight as of this encounter: 220 lb (99.8 kg).     Assessment:       ICD-10-CM   1. Dyspnea and respiratory abnormality R06.00    R06.89   2. 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 Other pulmonary embolism without acute cor pulmonale,  unspecified chronicity (HCC) I26.99        Plan:      VAsculitis:   - per Dr Garen Lah [facility] Pulmonary embolisum May 2018:   -continue xarelto.  - pressurs slightly high on echo and we will keep an eye on this  - pain in chest probably related to the PE episode - will do CXR 2 view 11/13/2016 and call with results  - you might not need o2 but continue for now till heart issues sorted  Out  Shorness of breath   - due to weight and all of above issues  Followup  - 6 months or sooner if needed; repeat walk test  > 50% of this > 25 min visit spent in face to face counseling or coordination of care    Dr. Brand Males, M.D., [facility].C.P Pulmonary and Critical Care Medicine Staff Physician [place] [facility] Pager: [phone], If no answer or between  15:00h - 7:00h: call [phone]  11/13/2016 12:54 PM

## 2016-11-20 ENCOUNTER — Ambulatory Visit (INDEPENDENT_AMBULATORY_CARE_PROVIDER_SITE_OTHER): Payer: Medicare HMO | Admitting: Internal Medicine

## 2016-11-20 ENCOUNTER — Encounter: Payer: Self-pay | Admitting: Internal Medicine

## 2016-11-20 VITALS — BP 112/76 | HR 74 | Ht 63.0 in | Wt 223.0 lb

## 2016-11-20 DIAGNOSIS — I471 Supraventricular tachycardia: Secondary | ICD-10-CM | POA: Diagnosis not present

## 2016-11-20 NOTE — Progress Notes (Signed)
HPI [name] is referred today from the emergency room for evaluation of SVT. She is a very pleasant 61 year old woman who has an over 30 year history of tachycardia palpitations and documented SVT at 200 beats a minute. These episodes stop and start suddenly and are associated with shortness of breath, chest pressure, diaphoresis, and near syncope. She was seen in the emergency room less than a month ago with one of her typical episodes with documented SVT at 200 bpm. She was given 6 mg of intravenous adenosine, restoring sinus rhythm. The patient has been on medical therapy with beta blockers. She thinks that she has had over 10 episodes in her lifetime and several episodes this year. She denies overuse of caffeine, or alcohol. Allergies  Allergen Reactions  . Nitrofurantoin Itching and Shortness Of Breath  . Effexor [Venlafaxine] Hives  . Myrbetriq [Mirabegron] Other (See Comments)    BACK PAIN DRY MOUTH  . Nsaids Other (See Comments) and Hives    Hypersensitivity vasculitis VASCULITIS  . Reglan [Metoclopramide] Other (See Comments)    Blister in mouth   . Clarithromycin     UNSPECIFIED REACTION   . Flagyl [Metronidazole]     UNSPECIFIED REACTION   . Paxil [Paroxetine]     UNSPECIFIED REACTION   . Ampicillin Rash  . Codeine Other (See Comments)    Head feels like its crawling  . Fluoxetine Hcl Itching    Deep Itch   . Sulfa Antibiotics Other (See Comments) and Nausea And Vomiting    Stomach irritation Stomach irritation  . Tetracycline Nausea And Vomiting    Severe stomach pain     Current Outpatient Prescriptions  Medication Sig Dispense Refill  . atorvastatin (LIPITOR) 20 MG tablet Take 20 mg by mouth daily.    . cetirizine (ZYRTEC) 10 MG tablet Take 10 mg by mouth daily.    . Cholecalciferol (VITAMIN D3) 1000 units CAPS Take by mouth daily.    Marland Kitchen DEXILANT 30 MG capsule Take 1 capsule (30 mg total) by mouth daily. 30 capsule 5  . docusate sodium (COLACE) 100 MG  capsule Take 100 mg by mouth daily.    . furosemide (LASIX) 20 MG tablet Take 20 mg by mouth daily.    . metoprolol tartrate (LOPRESSOR) 25 MG tablet Take 25 mg by mouth daily.    . mycophenolate (CELLCEPT) 500 MG tablet Take 1,000 mg by mouth 2 (two) times daily.    . ondansetron (ZOFRAN-ODT) 4 MG disintegrating tablet Take 4 mg by mouth every 6 (six) hours as needed for nausea.     . ONE TOUCH ULTRA TEST test strip USE 1 STRIP VIA METER THREE TIMES A DAY  2  . PAPAYA PO Take by mouth daily.    . Potassium (POTASSIMIN PO) Take by mouth. OTC    . predniSONE (DELTASONE) 5 MG tablet Take 5 mg by mouth daily with breakfast.    . rivaroxaban (XARELTO) 20 MG TABS tablet Take 1 tablet (20 mg total) by mouth daily with supper. 30 tablet 4  . sertraline (ZOLOFT) 100 MG tablet TAKE 1 TABLET BY MOUTH EVERY NIGHT AT BEDTIME 90 tablet 3  . VESICARE 5 MG tablet Take 5 mg by mouth daily.     No current facility-administered medications for this visit.      Past Medical History:  Diagnosis Date  . Allergy   . Anxiety   . Complication of anesthesia    difficulty waking up  . Depression   .  Dysrhythmia    svt  . GERD (gastroesophageal reflux disease)   . Heart murmur    never seen cardiologist for this  . Hypertension   . Leukocytoclastic vasculitis (HCC)    Dr. Marchelle Gearing and Dr. Corliss Skains  . Migraines    maybe one a year  . Renal disorder   . Renal insufficiency   . Sleep apnea    cpap  . SVT (supraventricular tachycardia) (HCC)   . Wegner's disease (congenital syphilitic osteochondritis)     ROS:   All systems reviewed and negative except as noted in the HPI.   Past Surgical History:  Procedure Laterality Date  . BUNIONECTOMY Bilateral   . CARPAL TUNNEL RELEASE Right 2002  . JOINT REPLACEMENT Right    knee  . LOWER EXTREMITY VENOGRAPHY Right 07/14/2016   Procedure: Lower Extremity Venography;  Surgeon: Maeola Harman, MD;  Location: [facility] INVASIVE CV LAB;  Service:  Cardiovascular;  Laterality: Right;  . PERCUTANEOUS VENOUS THROMBECTOMY,LYSIS WITH INTRAVASCULAR ULTRASOUND (IVUS) Right 07/17/2016   Procedure: PERCUTANEOUS VENOUS THROMBECTOMY AND LYSIS WITH INTRAVASCULAR ULTRASOUND (IVUS) of right lower extermity with balloon angioplasty;  Surgeon: Maeola Harman, MD;  Location: [facility] OR;  Service: Vascular;  Laterality: Right;  . TONSILLECTOMY  08/2002  . TOTAL KNEE ARTHROPLASTY Right   . TOTAL KNEE ARTHROPLASTY Left 02/21/2014   Procedure: LEFT TOTAL KNEE ARTHROPLASTY;  Surgeon: Velna Ochs, MD;  Location: [facility] OR;  Service: Orthopedics;  Laterality: Left;  . TUBAL LIGATION  1980  . ULTRASOUND GUIDANCE FOR VASCULAR ACCESS Right 07/17/2016   Procedure: ULTRASOUND GUIDANCE FOR VASCULAR ACCESS;  Surgeon: Maeola Harman, MD;  Location: [facility] OR;  Service: Vascular;  Laterality: Right;  . VENOGRAM Right 07/17/2016   Procedure: right ASCENDING VENOGRAM WITH ANGIOJET;  Surgeon: Maeola Harman, MD;  Location: [facility] OR;  Service: Vascular;  Laterality: Right;     Family History  Problem Relation Age of Onset  . Clotting disorder Mother 85       Blood clot, foot amputation  . Cervical cancer Mother   . Arthritis Sister   . Heart attack Maternal Grandmother 51  . Breast cancer Sister 38  . Asthma Cousin      Social History   Social History  . Marital status: Divorced    Spouse name: N/A  . Number of children: N/A  . Years of education: N/A   Occupational History  . Unemployed    Social History Main Topics  . Smoking status: Never Smoker  . Smokeless tobacco: Never Used  . Alcohol use No  . Drug use: No  . Sexual activity: Not on file   Other Topics Concern  . Not on file   Social History Narrative  . No narrative on file     BP 112/76   Pulse 74   Ht  (1.6 m)   Wt 223 lb (101.2 kg)   BMI 39.50 kg/m   Physical Exam:  Well appearing 61 year old woman, NAD HEENT: Unremarkable Neck:  6 cm JVD, no  thyromegally Lymphatics:  No adenopathy Back:  No CVA tenderness Lungs:  Clear, with no wheezes, rales, or rhonchi. HEART:  Regular rate rhythm, no murmurs, no rubs, no clicks Abd:  soft, positive bowel sounds, no organomegally, no rebound, no guarding Ext:  2 plus pulses, no edema, no cyanosis, no clubbing Skin:  No rashes no nodules Neuro:  CN II through XII intact, motor grossly intact  EKG - normal sinus rhythm with no ventricular preexcitation.  Assess/Plan: 1. SVT - I discussed the treatment options in detail. She has a class I indication for EP study and catheter ablation. The risk, goals, benefits, and expectations of the procedure were reviewed with the patient. She will call us if she would like to proceed with catheter ablation. She is encouraged to avoid caffeine and alcohol. I spent 45 minutes with the patient reviewing her problem and outlined a treatment plan.  Lewayne BuntingGregg Kindle Strohmeier, M.D.

## 2016-11-20 NOTE — Patient Instructions (Addendum)
Medication Instructions:  Your physician recommends that you continue on your current medications as directed. Please refer to the Current Medication list given to you today.  Labwork: None ordered.  Testing/Procedures:  Your physician has recommended that you have an ablation. Catheter ablation is a medical procedure used to treat some cardiac arrhythmias (irregular heartbeats). During catheter ablation, a long, thin, flexible tube is put into a blood vessel in your groin (upper thigh), or neck. This tube is called an ablation catheter. It is then guided to your heart through the blood vessel. Radio frequency waves destroy small areas of heart tissue where abnormal heartbeats may cause an arrhythmia to start.   Dates available in October: October 3, 8, 10 ,22 and 29. If you need any additional days in November let me know. Call me when you have decided what day you would like to be scheduled. Ancil BoozerJenny Neftaly Inzunza, RN [phone]  Follow-Up: Follow up will be scheduled after your procedure.   Any Other Special Instructions Will Be Listed Below (If Applicable).     If you need a refill on your cardiac medications before your next appointment, please call your pharmacy.

## 2016-12-10 ENCOUNTER — Encounter: Payer: Self-pay | Admitting: Internal Medicine

## 2016-12-17 ENCOUNTER — Encounter: Payer: Self-pay | Admitting: Internal Medicine

## 2016-12-17 DIAGNOSIS — I471 Supraventricular tachycardia: Secondary | ICD-10-CM

## 2017-01-16 ENCOUNTER — Other Ambulatory Visit: Payer: Self-pay | Admitting: Adult Health

## 2017-02-03 ENCOUNTER — Telehealth: Payer: Self-pay | Admitting: Adult Health

## 2017-02-03 MED ORDER — DEXILANT 30 MG PO CPDR
30.0000 mg | DELAYED_RELEASE_CAPSULE | Freq: Every day | ORAL | 5 refills | Status: DC
Start: 2017-02-03 — End: 2017-02-12

## 2017-02-03 NOTE — Telephone Encounter (Signed)
Received faxed request to change pt's Dexilant 30mg  to 90 day supply TP last filled at the 5.29.18 visit Last ov 8.30.18 w/ MR, follow up in 6 months  Rx sent under MR's name Nothing further needed; will sign off

## 2017-02-09 ENCOUNTER — Telehealth: Payer: Self-pay

## 2017-02-09 NOTE — Telephone Encounter (Signed)
Call placed to Pt.  Notified Pt her procedure had to be pushed back on Feb 20, 2017.  Notified Pt arrival time would now be 9:30 am for ablation at 11:30 am.  Pt indicates understanding.  Is ok with time move.

## 2017-02-09 NOTE — Telephone Encounter (Deleted)
Detailed message left for Pt.  Notified that Pt ablation had to be rescheduled for 9:30 am, Pt arrival time will now be 7:30 am.  Left this nurse name and # for call back if any questions.

## 2017-02-11 ENCOUNTER — Other Ambulatory Visit: Payer: Medicare HMO | Admitting: *Deleted

## 2017-02-11 DIAGNOSIS — I471 Supraventricular tachycardia: Secondary | ICD-10-CM

## 2017-02-11 LAB — CBC
Hematocrit: 36 % (ref 34.0–46.6)
Hemoglobin: 11.7 g/dL (ref 11.1–15.9)
MCH: 30 pg (ref 26.6–33.0)
MCHC: 32.5 g/dL (ref 31.5–35.7)
MCV: 92 fL (ref 79–97)
PLATELETS: 163 10*3/uL (ref 150–379)
RBC: 3.9 x10E6/uL (ref 3.77–5.28)
RDW: 14.7 % (ref 12.3–15.4)
WBC: 4.5 10*3/uL (ref 3.4–10.8)

## 2017-02-11 LAB — BASIC METABOLIC PANEL
BUN / CREAT RATIO: 18 (ref 12–28)
BUN: 21 mg/dL (ref 8–27)
CALCIUM: 9.1 mg/dL (ref 8.7–10.3)
CO2: 26 mmol/L (ref 20–29)
Chloride: 103 mmol/L (ref 96–106)
Creatinine, Ser: 1.19 mg/dL — ABNORMAL HIGH (ref 0.57–1.00)
GFR calc Af Amer: 57 mL/min/{1.73_m2} — ABNORMAL LOW (ref >59)
GFR, EST NON AFRICAN AMERICAN: 49 mL/min/{1.73_m2} — AB (ref >59)
GLUCOSE: 103 mg/dL — AB (ref 65–99)
Potassium: 3.7 mmol/L (ref 3.5–5.2)
SODIUM: 146 mmol/L — AB (ref 134–144)

## 2017-02-12 MED ORDER — DEXILANT 30 MG PO CPDR: 30.0000 mg | DELAYED_RELEASE_CAPSULE | Freq: Every day | ORAL | 1 refills | Status: DC

## 2017-02-12 NOTE — Telephone Encounter (Signed)
Received fax from [facility] requesting 90 day supply Erroneously, only a 30 day supply was sent Rx resent as 90 day supply

## 2017-02-12 NOTE — Addendum Note (Signed)
Addended by: Boone MasterJONES, JESSICA E on: 02/12/2017 05:03 PM   Modules accepted: Orders

## 2017-02-20 ENCOUNTER — Ambulatory Visit (HOSPITAL_COMMUNITY)
Admission: RE | Admit: 2017-02-20 | Discharge: 2017-02-20 | Disposition: A | Discharge status: Home / Self Care | Payer: Medicare HMO | Source: Ambulatory Visit | Attending: Internal Medicine | Admitting: Internal Medicine

## 2017-02-20 ENCOUNTER — Encounter (HOSPITAL_COMMUNITY)
Admission: RE | Disposition: A | Discharge status: Home / Self Care | Payer: Self-pay | Source: Ambulatory Visit | Attending: Internal Medicine

## 2017-02-20 DIAGNOSIS — I471 Supraventricular tachycardia, unspecified: Secondary | ICD-10-CM | POA: Diagnosis present

## 2017-02-20 DIAGNOSIS — Z96653 Presence of artificial knee joint, bilateral: Secondary | ICD-10-CM | POA: Diagnosis not present

## 2017-02-20 DIAGNOSIS — Z880 Allergy status to penicillin: Secondary | ICD-10-CM | POA: Insufficient documentation

## 2017-02-20 DIAGNOSIS — I10 Essential (primary) hypertension: Secondary | ICD-10-CM | POA: Diagnosis not present

## 2017-02-20 DIAGNOSIS — F419 Anxiety disorder, unspecified: Secondary | ICD-10-CM | POA: Insufficient documentation

## 2017-02-20 DIAGNOSIS — F329 Major depressive disorder, single episode, unspecified: Secondary | ICD-10-CM | POA: Insufficient documentation

## 2017-02-20 DIAGNOSIS — Z79899 Other long term (current) drug therapy: Secondary | ICD-10-CM | POA: Diagnosis not present

## 2017-02-20 DIAGNOSIS — Z882 Allergy status to sulfonamides status: Secondary | ICD-10-CM | POA: Insufficient documentation

## 2017-02-20 DIAGNOSIS — Z881 Allergy status to other antibiotic agents status: Secondary | ICD-10-CM | POA: Diagnosis not present

## 2017-02-20 DIAGNOSIS — Z7901 Long term (current) use of anticoagulants: Secondary | ICD-10-CM | POA: Insufficient documentation

## 2017-02-20 DIAGNOSIS — Z886 Allergy status to analgesic agent status: Secondary | ICD-10-CM | POA: Diagnosis not present

## 2017-02-20 HISTORY — PX: SVT ABLATION: EP1225

## 2017-02-20 LAB — GLUCOSE, CAPILLARY
GLUCOSE-CAPILLARY: 117 mg/dL — AB (ref 65–99)
Glucose-Capillary: 99 mg/dL (ref 65–99)

## 2017-02-20 SURGERY — SVT ABLATION

## 2017-02-20 MED ORDER — FENTANYL CITRATE (PF) 100 MCG/2ML IJ SOLN
INTRAMUSCULAR | Status: DC | PRN
  Administered 2017-02-20 (×4): 12.5 ug via INTRAVENOUS
  Administered 2017-02-20 (×2): 25 ug via INTRAVENOUS

## 2017-02-20 MED ORDER — ISOPROTERENOL HCL 0.2 MG/ML IJ SOLN
INTRAMUSCULAR | Status: AC
Start: 2017-02-20 — End: ?
  Filled 2017-02-20: qty 5

## 2017-02-20 MED ORDER — HEPARIN (PORCINE) IN NACL 2-0.9 UNIT/ML-% IJ SOLN
INTRAMUSCULAR | Status: AC | PRN
  Administered 2017-02-20: 500 mL

## 2017-02-20 MED ORDER — MIDAZOLAM HCL 5 MG/5ML IJ SOLN
INTRAMUSCULAR | Status: AC
  Filled 2017-02-20: qty 5

## 2017-02-20 MED ORDER — SODIUM CHLORIDE 0.9% FLUSH: 3.0000 mL | Freq: Two times a day (BID) | INTRAVENOUS | Status: DC

## 2017-02-20 MED ORDER — BUPIVACAINE HCL (PF) 0.25 % IJ SOLN
INTRAMUSCULAR | Status: AC
  Filled 2017-02-20: qty 30

## 2017-02-20 MED ORDER — SODIUM CHLORIDE 0.9 % IV SOLN: 250.0000 mL | INTRAVENOUS | Status: DC | PRN

## 2017-02-20 MED ORDER — SODIUM CHLORIDE 0.9 % IV SOLN
INTRAVENOUS | Status: DC
  Administered 2017-02-20: 10:00:00 via INTRAVENOUS

## 2017-02-20 MED ORDER — SODIUM CHLORIDE 0.9% FLUSH: 3.0000 mL | INTRAVENOUS | Status: DC | PRN

## 2017-02-20 MED ORDER — ACETAMINOPHEN 325 MG PO TABS: 650.0000 mg | ORAL_TABLET | ORAL | Status: DC | PRN

## 2017-02-20 MED ORDER — BUPIVACAINE HCL (PF) 0.25 % IJ SOLN
INTRAMUSCULAR | Status: DC | PRN
  Administered 2017-02-20: 49 mL

## 2017-02-20 MED ORDER — ONDANSETRON HCL 4 MG/2ML IJ SOLN: 4.0000 mg | Freq: Four times a day (QID) | INTRAMUSCULAR | Status: DC | PRN

## 2017-02-20 MED ORDER — MIDAZOLAM HCL 5 MG/5ML IJ SOLN
INTRAMUSCULAR | Status: DC | PRN
  Administered 2017-02-20 (×4): 1 mg via INTRAVENOUS
  Administered 2017-02-20: 2 mg via INTRAVENOUS
  Administered 2017-02-20 (×2): 1 mg via INTRAVENOUS

## 2017-02-20 MED ORDER — FENTANYL CITRATE (PF) 100 MCG/2ML IJ SOLN
INTRAMUSCULAR | Status: AC
  Filled 2017-02-20: qty 2

## 2017-02-20 MED ORDER — SODIUM CHLORIDE 0.9 % IV SOLN
INTRAVENOUS | Status: DC | PRN
  Administered 2017-02-20: 4 ug/min via INTRAVENOUS

## 2017-02-20 SURGICAL SUPPLY — 11 items
BAG SNAP BAND KOVER 36X36 (MISCELLANEOUS) ×2 IMPLANT
CATH CELSIUS THERM D CV 7F (ABLATOR) ×2 IMPLANT
CATH HEX JOS 2-5-2 65CM 6F REP (CATHETERS) ×2 IMPLANT
CATH JOSEPHSON QUAD-ALLRED 6FR (CATHETERS) ×4 IMPLANT
NEEDLE PERC 18GX4CM (NEEDLE) ×2 IMPLANT
PACK EP LATEX FREE (CUSTOM PROCEDURE TRAY) ×1
PACK EP LF (CUSTOM PROCEDURE TRAY) ×1 IMPLANT
PAD DEFIB LIFELINK (PAD) ×2 IMPLANT
SHEATH PINNACLE 6F 10CM (SHEATH) ×4 IMPLANT
SHEATH PINNACLE 7F 10CM (SHEATH) ×2 IMPLANT
SHEATH PINNACLE 8F 10CM (SHEATH) ×2 IMPLANT

## 2017-02-20 NOTE — Progress Notes (Signed)
Patient is seen post procedure R IJ and groin sits are stable, no bleeding, hematoa, are non-tender. Pt denies any CP, SOB Telemetry is SR, 129/59 Activity restrictions and site care were discussed Routine post procedure follow up is arranged Will keep metoprolol for her HTN, she will follow up this with her PMD Resume Xarelto tomorrow  Planned for discharge after bedrest is completed, if sites and pt remain stable  Francis Dowseenee Ursuy, PA-C  EP attending  Patient seen and examined.  Agree with the findings as noted above.  She is stable for discharge.  Sharrell KuGreg Taylor, MD

## 2017-02-20 NOTE — H&P (Signed)
HPI [name] is referred today from the emergency room for evaluation of SVT. She is a very pleasant 61 year old woman who has an over 30 year history of tachycardia palpitations and documented SVT at 200 beats a minute. These episodes stop and start suddenly and are associated with shortness of breath, chest pressure, diaphoresis, and near syncope. She was seen in the emergency room less than a month ago with one of her typical episodes with documented SVT at 200 bpm. She was given 6 mg of intravenous adenosine, restoring sinus rhythm. The patient has been on medical therapy with beta blockers. She thinks that she has had over 10 episodes in her lifetime and several episodes this year. She denies overuse of caffeine, or alcohol.      Allergies  Allergen Reactions  . Nitrofurantoin Itching and Shortness Of Breath  . Effexor [Venlafaxine] Hives  . Myrbetriq [Mirabegron] Other (See Comments)    BACK PAIN DRY MOUTH  . Nsaids Other (See Comments) and Hives    Hypersensitivity vasculitis VASCULITIS  . Reglan [Metoclopramide] Other (See Comments)    Blister in mouth   . Clarithromycin     UNSPECIFIED REACTION   . Flagyl [Metronidazole]     UNSPECIFIED REACTION   . Paxil [Paroxetine]     UNSPECIFIED REACTION   . Ampicillin Rash  . Codeine Other (See Comments)    Head feels like its crawling  . Fluoxetine Hcl Itching    Deep Itch   . Sulfa Antibiotics Other (See Comments) and Nausea And Vomiting    Stomach irritation Stomach irritation  . Tetracycline Nausea And Vomiting    Severe stomach pain           Current Outpatient Prescriptions  Medication Sig Dispense Refill  . atorvastatin (LIPITOR) 20 MG tablet Take 20 mg by mouth daily.    . cetirizine (ZYRTEC) 10 MG tablet Take 10 mg by mouth daily.    . Cholecalciferol (VITAMIN D3) 1000 units CAPS Take by mouth daily.    Marland Kitchen DEXILANT 30 MG capsule Take 1 capsule (30 mg total) by mouth daily.  30 capsule 5  . docusate sodium (COLACE) 100 MG capsule Take 100 mg by mouth daily.    . furosemide (LASIX) 20 MG tablet Take 20 mg by mouth daily.    . metoprolol tartrate (LOPRESSOR) 25 MG tablet Take 25 mg by mouth daily.    . mycophenolate (CELLCEPT) 500 MG tablet Take 1,000 mg by mouth 2 (two) times daily.    . ondansetron (ZOFRAN-ODT) 4 MG disintegrating tablet Take 4 mg by mouth every 6 (six) hours as needed for nausea.     . ONE TOUCH ULTRA TEST test strip USE 1 STRIP VIA METER THREE TIMES A DAY  2  . PAPAYA PO Take by mouth daily.    . Potassium (POTASSIMIN PO) Take by mouth. OTC    . predniSONE (DELTASONE) 5 MG tablet Take 5 mg by mouth daily with breakfast.    . rivaroxaban (XARELTO) 20 MG TABS tablet Take 1 tablet (20 mg total) by mouth daily with supper. 30 tablet 4  . sertraline (ZOLOFT) 100 MG tablet TAKE 1 TABLET BY MOUTH EVERY NIGHT AT BEDTIME 90 tablet 3  . VESICARE 5 MG tablet Take 5 mg by mouth daily.     No current facility-administered medications for this visit.          Past Medical History:  Diagnosis Date  . Allergy   . Anxiety   .  Complication of anesthesia    difficulty waking up  . Depression   . Dysrhythmia    svt  . GERD (gastroesophageal reflux disease)   . Heart murmur    never seen cardiologist for this  . Hypertension   . Leukocytoclastic vasculitis (HCC)    Dr. Ramaswamy and Dr. Corliss Skainseveshwar  . Migraines    maybe one a year  . Renal disorder   . Renal insufficiency   . Sleep apnea    cpap  . SVT (supraventricular tachycardia) ([facility])   . Wegner's disease (congenital syphilitic osteochondritis)     ROS:   All systems reviewed and negative except as noted in the HPI.        Past Surgical History:  Procedure Laterality Date  . BUNIONECTOMY Bilateral   . CARPAL TUNNEL RELEASE Right 2002  . JOINT REPLACEMENT Right    knee  . LOWER EXTREMITY VENOGRAPHY Right 07/14/2016   Procedure:  Lower Extremity Venography;  Surgeon: Maeola HarmanBrandon Christopher Cain, MD;  [facility];  Service: Cardiovascular;  Laterality: Right;  . PERCUTANEOUS VENOUS THROMBECTOMY,LYSIS WITH INTRAVASCULAR ULTRASOUND (IVUS) Right 07/17/2016   Procedure: PERCUTANEOUS VENOUS THROMBECTOMY AND LYSIS WITH INTRAVASCULAR ULTRASOUND (IVUS) of right lower extermity with balloon angioplasty;  Surgeon: Maeola HarmanBrandon Christopher Cain, MD;  [facility];  Service: Vascular;  Laterality: Right;  . TONSILLECTOMY  08/2002  . TOTAL KNEE ARTHROPLASTY Right   . TOTAL KNEE ARTHROPLASTY Left 02/21/2014   Procedure: LEFT TOTAL KNEE ARTHROPLASTY;  Surgeon: Velna OchsPeter G Dalldorf, MD;  Location: [facility] OR;  Service: Orthopedics;  Laterality: Left;  . TUBAL LIGATION  1980  . ULTRASOUND GUIDANCE FOR VASCULAR ACCESS Right 07/17/2016   Procedure: ULTRASOUND GUIDANCE FOR VASCULAR ACCESS;  Surgeon: Maeola HarmanBrandon Christopher Cain, MD;  [facility];  Service: Vascular;  Laterality: Right;  . VENOGRAM Right 07/17/2016   Procedure: right ASCENDING VENOGRAM WITH ANGIOJET;  Surgeon: Maeola HarmanBrandon Christopher Cain, MD;  [facility];  Service: Vascular;  Laterality: Right;          Family History  Problem Relation Age of Onset  . Clotting disorder Mother 7865       Blood clot, foot amputation  . Cervical cancer Mother   . Arthritis Sister   . Heart attack Maternal Grandmother 51  . Breast cancer Sister 7930  . Asthma Cousin      Social History        Social History  . Marital status: Divorced    Spouse name: N/A  . Number of children: N/A  . Years of education: N/A       Occupational History  . Unemployed        Social History Main Topics  . Smoking status: Never Smoker  . Smokeless tobacco: Never Used  . Alcohol use No  . Drug use: No  . Sexual activity: Not on file       Other Topics Concern  . Not on file      Social History Narrative  . No narrative on file     BP 112/76   Pulse 74   Ht 5'  3" (1.6 m)   Wt 223 lb (101.2 kg)   BMI 39.50 kg/m   Physical Exam:  Well appearing 61 year old woman, NAD HEENT: Unremarkable Neck:  6 cm JVD, no thyromegally Lymphatics:  No adenopathy Back:  No CVA tenderness Lungs:  Clear, with no wheezes, rales, or rhonchi. HEART:  Regular rate rhythm, no murmurs, no rubs, no clicks Abd:  soft, positive  bowel sounds, no organomegally, no rebound, no guarding Ext:  2 plus pulses, no edema, no cyanosis, no clubbing Skin:  No rashes no nodules Neuro:  CN II through XII intact, motor grossly intact  EKG - normal sinus rhythm with no ventricular preexcitation.  Assess/Plan: 1. SVT - I discussed the treatment options in detail. She has a class I indication for EP study and catheter ablation. The risk, goals, benefits, and expectations of the procedure were reviewed with the patient. She will call us if she would like to proceed with catheter ablation. She is encouraged to avoid caffeine and alcohol. I spent 45 minutes with the patient reviewing her problem and outlined a treatment plan.  Cassandra [name], M.D  EP Attending  Patient seen and examined. Agree with above. Since her last visit, no change in the history, exam, assessment and plan.   Cassandra [name],M.D.

## 2017-02-20 NOTE — Progress Notes (Signed)
Site area: rt IJ venous sheath Site Prior to Removal:  Level 0 Pressure Applied For: 10 minutes Manual:   yes Patient Status During Pull:  stable Post Pull Site:  Level 0 Post Pull Instructions Given:  yes Post Pull Pulses Present: NA Dressing Applied:  Gauze and tegaderm Bedrest begins @  Comments:   

## 2017-02-20 NOTE — Discharge Instructions (Signed)
Procedure site care and activity instructions: No driving for 4 days. No lifting over 5 lbs for 1 week. No vigorous or sexual activity for 1 week. You may return to work in one week. Keep procedure site clean & dry. If you notice increased pain, swelling, bleeding or pus, call/return!  You may shower, but no soaking baths/hot tubs/pools for 1 week.

## 2017-02-20 NOTE — Progress Notes (Signed)
Site area: rt groin fv sheaths x3 Site Prior to Removal:  Level 0 Pressure Applied For: 10 minutes Manual:   yes Patient Status During Pull:  stable Post Pull Site:  Level 0 Post Pull Instructions Given:  yes Post Pull Pulses Present: palpable Dressing Applied:  Gauze and tegaderm Bedrest begins @ 1510 Comments: IV saline locked

## 2017-02-22 ENCOUNTER — Encounter (HOSPITAL_COMMUNITY): Payer: Self-pay | Admitting: Internal Medicine

## 2017-02-24 ENCOUNTER — Encounter: Payer: Self-pay | Admitting: Internal Medicine

## 2017-03-12 ENCOUNTER — Telehealth: Payer: Self-pay | Admitting: Internal Medicine

## 2017-03-12 DIAGNOSIS — R0600 Dyspnea, unspecified: Secondary | ICD-10-CM

## 2017-03-12 DIAGNOSIS — R0689 Other abnormalities of breathing: Principal | ICD-10-CM

## 2017-03-12 NOTE — Telephone Encounter (Signed)
Spoke with pt, she states she does not need her oxygen anymore, she has not been using it, [facility] and have them pick up equipment? MR please advise.     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             - pressurs slightly high on echo and we will keep an eye on this             - pain in chest probably related to the PE episode - will do CXR 2 view 11/13/2016 and call with results             - you might not need o2 but continue for now till heart issues sorted  Out  Shorness of breath   - due to weight and all of above issues  Followup  - 6 months or sooner if needed; repeat walk test

## 2017-03-13 NOTE — Telephone Encounter (Signed)
Yes cancel o2  Dr. Kalman ShanMurali Boden Stucky, M.D., [facility]P Pulmonary and Critical Care Medicine Staff Physician, [facility] Center Director - Interstitial Lung Disease  Program  Pulmonary Fibrosis [facility] [place], [place], [id number]  Pager: [phone], If no answer or between  15:00h - 7:00h: call [phone] Telephone: [phone]

## 2017-03-13 NOTE — Addendum Note (Signed)
Addended by: Velvet BatheAULFIELD, ASHLEY L on: 03/13/2017 11:56 AM   Modules accepted: Orders

## 2017-03-13 NOTE — Telephone Encounter (Signed)
[facility]  [facility] detailed message [facility].  Nothing further needed.

## 2017-03-20 ENCOUNTER — Ambulatory Visit (INDEPENDENT_AMBULATORY_CARE_PROVIDER_SITE_OTHER): Payer: Medicare HMO | Admitting: Family

## 2017-03-20 ENCOUNTER — Ambulatory Visit (HOSPITAL_COMMUNITY)
Admission: RE | Admit: 2017-03-20 | Discharge: 2017-03-20 | Disposition: A | Discharge status: Home / Self Care | Payer: Medicare HMO | Source: Ambulatory Visit | Attending: Family | Admitting: Family

## 2017-03-20 ENCOUNTER — Encounter: Payer: Self-pay | Admitting: Family

## 2017-03-20 ENCOUNTER — Other Ambulatory Visit: Payer: Self-pay

## 2017-03-20 VITALS — BP 133/80 | HR 66 | Temp 97.1°F | Resp 16 | Ht 63.0 in | Wt 223.0 lb

## 2017-03-20 DIAGNOSIS — Z86718 Personal history of other venous thrombosis and embolism: Secondary | ICD-10-CM | POA: Insufficient documentation

## 2017-03-20 DIAGNOSIS — I872 Venous insufficiency (chronic) (peripheral): Secondary | ICD-10-CM | POA: Insufficient documentation

## 2017-03-20 DIAGNOSIS — I82421 Acute embolism and thrombosis of right iliac vein: Secondary | ICD-10-CM

## 2017-03-20 DIAGNOSIS — I82521 Chronic embolism and thrombosis of right iliac vein: Secondary | ICD-10-CM

## 2017-03-20 NOTE — Progress Notes (Signed)
CC: Follow up s/p thrombectomy of right lower extremity and iliac veins with balloon angioplasty    History of Present Illness  Nat MathRhonda L Cliff is a 62 y.o. (20-Jun-1955) female who is s/p pharmacal mechanical thrombectomy of her right lower extremity and iliac veins with balloon angioplasty but no stenting on 07-17-16 by Dr. Randie Heinzain. During that hospitalization she also had acute kidney injury, thrombocytopenia, and leukopenia. She states that her right leg now is feeling fine and she is walking with her only limitation being shortness of breath and she did have a PE in the hospital that was in the periprocedural time. During the procedure we did not find any significant stenosis in the central venous system. She does have compression stockings but has not been wearing them. She does have minimal swelling of her right lower extremity that is not limiting her activities. She does not have any tissue loss or ulceration or skin changes.  Dr. Randie Heinzain last evaluated pt on 09-05-16. At that time there was no underlying cause found for the right leg and iliac veins thrombus formation. During her hospitalization she had several medical comorbidities conditions, no origin located with pulmonary embolus. She remained on oxygen and this was her greatest limitation to walking. She does have compression stockings but has not been wearing them. Dr. Randie Heinzain instructed her on wearing compression stockings at least to the knee 20-30 mmHg and she has agreed. Also told her to continue anticoagulation; unsure of a good stop date given we do not have any indication of the cause of her clotting. From Dr. Randie Heinzain standpoint point she should be on the Xarelto for at least 6 months. Follow-up in another 6 months with repeat duplex. In the interim Dr. Randie Heinzain advised walking, compression stocking , and continued anticoagulation.   Pt states she no longer needs supplemental O2.  She had a cardiac ablation in December 2018 for SVT.  She states  she feels much improved in general.  She saw her PCP recently and states her cholesterol is good.  She takes prednisone for an autoimmune disorder.  She states she has developed DM due to need for prednisone use, states her glucose checks at home are normal, no recent A1C result available.    Past Medical History:  Diagnosis Date  . Allergy   . Anxiety   . Complication of anesthesia    difficulty waking up  . Depression   . Dysrhythmia    svt  . GERD (gastroesophageal reflux disease)   . Heart murmur    never seen cardiologist for this  . Hypertension   . Leukocytoclastic vasculitis (HCC)    Dr. Marchelle Gearingamaswamy and Dr. Corliss Skainseveshwar  . Migraines    maybe one a year  . Renal disorder   . Renal insufficiency   . Sleep apnea    cpap  . SVT (supraventricular tachycardia) (HCC)   . Wegner's disease (congenital syphilitic osteochondritis)     Social History Social History   Tobacco Use  . Smoking status: Never Smoker  . Smokeless tobacco: Never Used  Substance Use Topics  . Alcohol use: No  . Drug use: No    Family History Family History  Problem Relation Age of Onset  . Clotting disorder Mother [id number]       Blood clot, foot amputation  . Cervical cancer Mother   . Arthritis Sister   . Heart attack Maternal Grandmother 51  . Breast cancer Sister 5230  . Asthma Cousin  Surgical History Past Surgical History:  Procedure Laterality Date  . BUNIONECTOMY Bilateral   . CARPAL TUNNEL RELEASE Right 2002  . JOINT REPLACEMENT Right    knee  . LOWER EXTREMITY VENOGRAPHY Right 07/14/2016   Procedure: Lower Extremity Venography;  Surgeon: Maeola Harman, MD;  Location: [facility] INVASIVE CV LAB;  Service: Cardiovascular;  Laterality: Right;  . PERCUTANEOUS VENOUS THROMBECTOMY,LYSIS WITH INTRAVASCULAR ULTRASOUND (IVUS) Right 07/17/2016   Procedure: PERCUTANEOUS VENOUS THROMBECTOMY AND LYSIS WITH INTRAVASCULAR ULTRASOUND (IVUS) of right lower extermity with balloon angioplasty;   Surgeon: Maeola Harman, MD;  Location: [facility] OR;  Service: Vascular;  Laterality: Right;  . SVT ABLATION N/A 02/20/2017   Procedure: SVT ABLATION;  Surgeon: Marinus Maw, MD;  Location: [facility] INVASIVE CV LAB;  Service: Cardiovascular;  Laterality: N/A;  . TONSILLECTOMY  08/2002  . TOTAL KNEE ARTHROPLASTY Right   . TOTAL KNEE ARTHROPLASTY Left 02/21/2014   Procedure: LEFT TOTAL KNEE ARTHROPLASTY;  Surgeon: Velna Ochs, MD;  Location: [facility] OR;  Service: Orthopedics;  Laterality: Left;  . TUBAL LIGATION  1980  . ULTRASOUND GUIDANCE FOR VASCULAR ACCESS Right 07/17/2016   Procedure: ULTRASOUND GUIDANCE FOR VASCULAR ACCESS;  Surgeon: Maeola Harman, MD;  Location: [facility] OR;  Service: Vascular;  Laterality: Right;  . VENOGRAM Right 07/17/2016   Procedure: right ASCENDING VENOGRAM WITH ANGIOJET;  Surgeon: Maeola Harman, MD;  Location: [facility] OR;  Service: Vascular;  Laterality: Right;    Allergies  Allergen Reactions  . Nitrofurantoin Itching and Shortness Of Breath  . Effexor [Venlafaxine] Hives  . Myrbetriq [Mirabegron] Other (See Comments)    BACK PAIN DRY MOUTH  . Nsaids Other (See Comments) and Hives    Hypersensitivity vasculitis VASCULITIS  . Reglan [Metoclopramide] Other (See Comments)    Blister in mouth   . Clarithromycin     UNSPECIFIED REACTION   . Flagyl [Metronidazole]     UNSPECIFIED REACTION   . Paxil [Paroxetine]     UNSPECIFIED REACTION   . Ampicillin Rash  . Codeine Other (See Comments)    Head feels like its crawling  . Fluoxetine Hcl Itching    Deep Itch   . Sulfa Antibiotics Other (See Comments) and Nausea And Vomiting    Stomach irritation Stomach irritation  . Tetracycline Nausea And Vomiting    Severe stomach pain    Current Outpatient Medications  Medication Sig Dispense Refill  . atorvastatin (LIPITOR) 20 MG tablet Take 20 mg by mouth daily.    . cetirizine (ZYRTEC) 10 MG tablet Take 10 mg by mouth daily.    Marland Kitchen DEXILANT 30 MG  capsule Take 1 capsule (30 mg total) by mouth daily. 90 capsule 1  . furosemide (LASIX) 20 MG tablet Take 20 mg by mouth daily.    . mycophenolate (CELLCEPT) 500 MG tablet Take 1,000 mg by mouth 2 (two) times daily.    . ONE TOUCH ULTRA TEST test strip USE 1 STRIP VIA METER THREE TIMES A DAY  2  . predniSONE (DELTASONE) 5 MG tablet Take 5 mg by mouth daily with breakfast.    . pseudoephedrine (SUDAFED) 30 MG tablet Take 60 mg by mouth 2 (two) times daily as needed for congestion.    . sertraline (ZOLOFT) 100 MG tablet TAKE 1 TABLET BY MOUTH EVERY NIGHT AT BEDTIME 90 tablet 3  . VESICARE 5 MG tablet Take 5 mg by mouth daily.    Carlena Hurl 20 MG TABS tablet TAKE 1 TABLET EVERY DAY WITH SUPPER 30 tablet  4  . Cholecalciferol (VITAMIN D3) 5000 units TABS Take 5,000 Units by mouth daily.     . metoprolol tartrate (LOPRESSOR) 25 MG tablet Take 25 mg by mouth daily.    . ondansetron (ZOFRAN-ODT) 4 MG disintegrating tablet Take 4 mg by mouth every 6 (six) hours as needed for nausea.      No current facility-administered medications for this visit.     REVIEW OF SYSTEMS: see HPI for pertinent positives and negatives   Physical Examination  Vitals:   03/20/17 0912  BP: 133/80  Pulse: 66  Resp: 16  Temp: (!) 97.1 F (36.2 C)  TempSrc: Oral  SpO2: 98%  Weight: 223 lb (101.2 kg)  Height: 5\' 3"  (1.6 m)   Body mass index is 39.5 kg/m.  General: The patient appears her stated age. Obese female.  HEENT:  No gross abnormalities Pulmonary: Respirations are non-labored, BBS CTAB Abdomen: Soft and non-tender with normal pitched bowel sounds. Musculoskeletal: There are no major deformities. Trace pitting edema right pretibial.  Neurologic: No focal weakness or paresthesias are detected.  Skin: There are no ulcer or rashes noted. Psychiatric: The patient has normal affect. Cardiovascular: There is a regular rate and rhythm without significant murmur appreciated.    Vascular: Vessel Right Left    Radial 2+Palpable 2+Palpable  Carotid  without bruit  without bruit  Aorta Not palpable N/A  Femoral 2+Palpable 2+Palpable  Popliteal Not palpable Not palpable  PT Not Palpable Not Palpable  DP 2+Palpable 1+Palpable    DATA  BLE IVC Iliac vein Duplex (Date: 03/20/2017):  No evidence of thrombus in the IVC or bilateral common and external iliac veins.  Vein wall thickening, consistent with chronic thrombus, in the right common femoral vein and in the proximal tigh segment of the right femoral vein. Deep venous reflux is seen in the right femoral vein. No evidence of acute thrombus. Improvement of thrombus compared to the exam on 08-28-16.     Medical Decision Making  NYRIAH COOTE is a 62 y.o. female who is s/p pharmacal mechanical thrombectomy of her right lower extremity and iliac veins with balloon angioplasty but no stenting on 07-17-16 by Dr. Randie Heinz. No etiology was found for the thrombus.  Dr. Randie Heinz spoke with and examined pt.   Based on the patient's vascular studies and examination, I have offered the patient: return in 6 months with IVC-iliac vein duplex bilateral.  Continue to wear 20-30 mm Hg knee high graduated compression hose during the day.   Continue to walk at least 30 minutes daily.   Thank you for allowing [place] to participate in this patient's care.  Charisse March, RN, MSN, FNP-C [facility] Office: [phone]  03/20/2017, 9:20 AM  Clinic MD: Chen/Cain

## 2017-03-20 NOTE — Patient Instructions (Signed)
Deep Vein Thrombosis Deep vein thrombosis (DVT) is a condition in which a blood clot forms in a deep vein, such as a lower leg, thigh, or arm vein. A clot is blood that has thickened into a gel or solid. This condition is dangerous. It can lead to serious and even life-threatening complications if the clot travels to the lungs and causes a blockage (pulmonary embolism). It can also damage veins in the leg. This can result in leg pain, swelling, discoloration, and sores (post-thrombotic syndrome). What are the causes? This condition may be caused by:  A slowdown of blood flow.  Damage to a vein.  A condition that makes blood clot more easily.  What increases the risk? The following factors may make you more likely to develop this condition:  Being overweight.  Being elderly, especially over age 60.  Sitting or lying down for more than four hours.  Lack of physical activity (sedentary lifestyle).  Being pregnant, giving birth, or having recently given birth.  Taking medicines that contain estrogen.  Smoking.  A history of any of the following: ? Blood clots or blood clotting disease. ? Peripheral vascular disease. ? Inflammatory bowel disease. ? Cancer. ? Heart disease. ? Genetic conditions that affect how blood clots. ? Neurological diseases that affect the legs (leg paresis). ? Injury. ? Major or lengthy surgery. ? A central line placed inside a large vein.  What are the signs or symptoms? Symptoms of this condition include:  Swelling, pain, or tenderness in an arm or leg.  Warmth, redness, or discoloration in an arm or leg.  If the clot is in your leg, symptoms may be more noticeable or worse when you stand or walk. Some people do not have any symptoms. How is this diagnosed? This condition is diagnosed with:  A medical history.  A physical exam.  Tests, such as: ? Blood tests. These are done to see how your blood clots. ? Imaging tests. These are done to  check for clots. Tests may include:  Ultrasound.  CT scan.  MRI.  X-ray.  Venogram. For this test, X-rays are taken after a dye is injected into a vein.  How is this treated? Treatment for this condition depends on the cause, your risk for bleeding or developing more clots, and any medical conditions you have. Treatment may include:  Taking blood thinners (also called anticoagulants). These medicines may be taken by mouth, injected under the skin, or injected through an IV tube (catheter). These medicines prevent clots from forming.  Injecting medicine that dissolves blood clots into the affected vein (catheter-directed thrombolysis).  Having surgery. Surgery may be done to: ? Remove the clot. ? Place a filter in a large vein to catch blood clots before they reach the lungs.  Some treatments may be continued for up to six months. Follow these instructions at home: If you are taking an oral blood thinner:  Take the medicine exactly as told by your health care provider. Some blood thinners need to be taken at the same time every day. Do not skip a dose.  Ask your health care provider about what foods and drugs interact with the medicine.  Ask about possible side effects. General instructions  Blood thinners can cause easy bruising and difficulty stopping bleeding. Because of this, if you are taking or were given a blood thinner: ? Hold pressure over cuts for longer than usual. ? Tell your dentist and other health care providers that you are taking blood thinners before   having any procedures that can cause bleeding. ? Avoid contact sports.  Take over-the-counter and prescription medicines only as told by your health care provider.  Return to your normal activities as told by your health care provider. Ask your health care provider what activities are safe for you.  Wear compression stockings if recommended by your health care provider.  Keep all follow-up visits as told by  your health care provider. This is important. How is this prevented? To lower your risk of developing this condition again:  For 30 or more minutes every day, do an activity that: ? Involves moving your arms and legs. ? Increases your heart rate.  When traveling for longer than four hours: ? Exercise your arms and legs every hour. ? Drink plenty of water. ? Avoid drinking alcohol.  Avoid sitting or lying for a long time without moving your legs.  Stay a healthy weight.  If you are a woman who is older than age 35, avoid unnecessary use of medicines that contain estrogen.  Do not use any products that contain nicotine or tobacco, such as cigarettes and e-cigarettes. This is especially important if you take estrogen medicines. If you need help quitting, ask your health care provider.  Contact a health care provider if:  You miss a dose of your blood thinner.  You have nausea, vomiting, or diarrhea that lasts for more than one day.  Your menstrual period is heavier than usual.  You have unusual bruising. Get help right away if:  You have new or increased pain, swelling, or redness in an arm or leg.  You have numbness or tingling in an arm or leg.  You have shortness of breath.  You have chest pain.  You have a rapid or irregular heartbeat.  You feel light-headed or dizzy.  You cough up blood.  There is blood in your vomit, stool, or urine.  You have a serious fall or accident, or you hit your head.  You have a severe headache or confusion.  You have a cut that will not stop bleeding. These symptoms may represent a serious problem that is an emergency. Do not wait to see if the symptoms will go away. Get medical help right away. Call your local emergency services (911 in the [place] Do not drive yourself to the hospital. Summary  DVT is a condition in which a blood clot forms in a deep vein, such as a lower leg, thigh, or arm vein.  Symptoms can include swelling,  warmth, pain, and redness in your leg or arm.  Treatment may include taking blood thinners, injecting medicine that dissolves blood clots,wearing compression stockings, or surgery.  If you are prescribed blood thinners, take them exactly as told. This information is not intended to replace advice given to you by your health care provider. Make sure you discuss any questions you have with your health care provider. Document Released: 03/03/2005 Document Revised: 04/05/2016 Document Reviewed: 04/05/2016 Elsevier Interactive Patient Education  2018 Elsevier Inc.  

## 2017-03-22 ENCOUNTER — Encounter: Payer: Self-pay | Admitting: Family

## 2017-03-26 ENCOUNTER — Ambulatory Visit: Payer: Medicare HMO | Admitting: Internal Medicine

## 2017-04-23 ENCOUNTER — Ambulatory Visit (INDEPENDENT_AMBULATORY_CARE_PROVIDER_SITE_OTHER): Payer: Medicare HMO | Admitting: Internal Medicine

## 2017-04-23 ENCOUNTER — Encounter: Payer: Self-pay | Admitting: Internal Medicine

## 2017-04-23 VITALS — BP 136/82 | HR 79 | Ht 63.0 in | Wt 225.0 lb

## 2017-04-23 DIAGNOSIS — I471 Supraventricular tachycardia: Secondary | ICD-10-CM | POA: Diagnosis not present

## 2017-04-23 NOTE — Progress Notes (Signed)
HPI Cassandra [name] returns today for followup of SVT. 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 In the interim, she has done well with no recurrent symptoms of svt. She has stopped taking her beta blocker.  Allergies  Allergen Reactions  . Nitrofurantoin Itching and Shortness Of Breath  . Effexor [Venlafaxine] Hives  . Myrbetriq [Mirabegron] Other (See Comments)    BACK PAIN DRY MOUTH  . Nsaids Other (See Comments) and Hives    Hypersensitivity vasculitis VASCULITIS  . Reglan [Metoclopramide] Other (See Comments)    Blister in mouth   . Clarithromycin     UNSPECIFIED REACTION   . Flagyl [Metronidazole]     UNSPECIFIED REACTION   . Paxil [Paroxetine]     UNSPECIFIED REACTION   . Ampicillin Rash  . Codeine Other (See Comments)    Head feels like its crawling  . Fluoxetine Hcl Itching    Deep Itch   . Sulfa Antibiotics Other (See Comments) and Nausea And Vomiting    Stomach irritation Stomach irritation  . Tetracycline Nausea And Vomiting    Severe stomach pain     Current Outpatient Medications  Medication Sig Dispense Refill  . atorvastatin (LIPITOR) 20 MG tablet Take 20 mg by mouth daily.    . cetirizine (ZYRTEC) 10 MG tablet Take 10 mg by mouth daily.    . Cholecalciferol (VITAMIN D3) 5000 units TABS Take 5,000 Units by mouth daily.     Marland Kitchen DEXILANT 30 MG capsule Take 1 capsule (30 mg total) by mouth daily. 90 capsule 1  . furosemide (LASIX) 20 MG tablet Take 20 mg by mouth daily.    . metoprolol tartrate (LOPRESSOR) 25 MG tablet Take 25 mg by mouth daily.    . mycophenolate (CELLCEPT) 500 MG tablet Take 1,000 mg by mouth 2 (two) times daily.    . ondansetron (ZOFRAN-ODT) 4 MG disintegrating tablet Take 4 mg by mouth every 6 (six) hours as needed for nausea.     . ONE TOUCH ULTRA TEST test strip USE 1 STRIP VIA METER THREE TIMES A DAY  2  . predniSONE (DELTASONE) 5 MG tablet Take 5 mg by mouth daily with  breakfast.    . pseudoephedrine (SUDAFED) 30 MG tablet Take 60 mg by mouth 2 (two) times daily as needed for congestion.    . sertraline (ZOLOFT) 100 MG tablet TAKE 1 TABLET BY MOUTH EVERY NIGHT AT BEDTIME 90 tablet 3  . VESICARE 5 MG tablet Take 5 mg by mouth daily.    Cassandra [name] 20 MG TABS tablet TAKE 1 TABLET EVERY DAY WITH SUPPER 30 tablet 4   No current facility-administered medications for this visit.      Past Medical History:  Diagnosis Date  . Allergy   . Anxiety   . Complication of anesthesia    difficulty waking up  . Depression   . Dysrhythmia    svt  . GERD (gastroesophageal reflux disease)   . Heart murmur    never seen cardiologist for this  . Hypertension   . Leukocytoclastic vasculitis (HCC)    Dr. Marchelle Gearing and Dr. Corliss Skains  . Migraines    maybe one a year  . Renal disorder   . Renal insufficiency   . Sleep apnea    cpap  . SVT (supraventricular tachycardia) (HCC)   . Type 2 diabetes mellitus with kidney complication, without long-term current use of insulin (HCC) 05/07/2016  .  Wegner's disease (congenital syphilitic osteochondritis)     ROS:   All systems reviewed and negative except as noted in the HPI.   Past Surgical History:  Procedure Laterality Date  . BUNIONECTOMY Bilateral   . CARPAL TUNNEL RELEASE Right 2002  . JOINT REPLACEMENT Right    knee  . LOWER EXTREMITY VENOGRAPHY Right 07/14/2016   Procedure: Lower Extremity Venography;  Surgeon: Maeola Harman, MD;  Location: [facility] INVASIVE CV LAB;  Service: Cardiovascular;  Laterality: Right;  . PERCUTANEOUS VENOUS THROMBECTOMY,LYSIS WITH INTRAVASCULAR ULTRASOUND (IVUS) Right 07/17/2016   Procedure: PERCUTANEOUS VENOUS THROMBECTOMY AND LYSIS WITH INTRAVASCULAR ULTRASOUND (IVUS) of right lower extermity with balloon angioplasty;  Surgeon: Maeola Harman, MD;  Location: [facility] OR;  Service: Vascular;  Laterality: Right;  . SVT ABLATION N/A 02/20/2017   Procedure: SVT ABLATION;  Surgeon:  Marinus Maw, MD;  Location: [facility] INVASIVE CV LAB;  Service: Cardiovascular;  Laterality: N/A;  . TONSILLECTOMY  08/2002  . TOTAL KNEE ARTHROPLASTY Right   . TOTAL KNEE ARTHROPLASTY Left 02/21/2014   Procedure: LEFT TOTAL KNEE ARTHROPLASTY;  Surgeon: Velna Ochs, MD;  Location: [facility] OR;  Service: Orthopedics;  Laterality: Left;  . TUBAL LIGATION  1980  . ULTRASOUND GUIDANCE FOR VASCULAR ACCESS Right 07/17/2016   Procedure: ULTRASOUND GUIDANCE FOR VASCULAR ACCESS;  Surgeon: Maeola Harman, MD;  Location: [facility] OR;  Service: Vascular;  Laterality: Right;  . VENOGRAM Right 07/17/2016   Procedure: right ASCENDING VENOGRAM WITH ANGIOJET;  Surgeon: Maeola Harman, MD;  Location: [facility] OR;  Service: Vascular;  Laterality: Right;     Family History  Problem Relation Age of Onset  . Clotting disorder Mother 54       Blood clot, foot amputation  . Cervical cancer Mother   . Arthritis Sister   . Heart attack Maternal Grandmother 51  . Breast cancer Sister 16  . Asthma Cousin      Social History   Socioeconomic History  . Marital status: Divorced    Spouse name: Not on file  . Number of children: Not on file  . Years of education: Not on file  . Highest education level: Not on file  Social Needs  . Financial resource strain: Not on file  . Food insecurity - worry: Not on file  . Food insecurity - inability: Not on file  . Transportation needs - medical: Not on file  . Transportation needs - non-medical: Not on file  Occupational History  . Occupation: Unemployed  Tobacco Use  . Smoking status: Never Smoker  . Smokeless tobacco: Never Used  Substance and Sexual Activity  . Alcohol use: No  . Drug use: No  . Sexual activity: Not on file  Other Topics Concern  . Not on file  Social History Narrative  . Not on file     BP 136/82   Pulse 79   Ht 5\' 3"  (1.6 m)   Wt 225 lb (102.1 kg)   BMI 39.86 kg/m   Physical Exam:  Well appearing 62 yo woman, NAD HEENT:  Unremarkable Neck:  6 cm JVD, no thyromegally Lymphatics:  No adenopathy Back:  No CVA tenderness Lungs:  Clear with no wheezes HEART:  Regular rate rhythm, no murmurs, no rubs, no clicks Abd:  soft, positive bowel sounds, no organomegally, no rebound, no guarding Ext:  2 plus pulses, no edema, no cyanosis, no clubbing Skin:  No rashes no nodules Neuro:  CN II through XII intact, motor grossly intact  EKG -  NSR with normal axis and intervals.   DEVICE  Normal device function.  See PaceArt for details.   Assess/Plan: 1. SVT - she is doing well after EPS/RFA of AVNRT. She will undergo watchful waiting.  2. Obesity - I have encouraged the patient to work on weight loss. She is encouraged to start walking. 3. HTN - her blood pressure was a bit high today now that she is off of her beta blocker. If she can lose weight, she will likely have a reduction in her blood pressure.  Leonia ReevesGregg Evgenia Merriman,M.D.

## 2017-04-23 NOTE — Patient Instructions (Addendum)
Medication Instructions:  Your physician recommends that you continue on your current medications as directed. Please refer to the Current Medication list given to you today.  Labwork: None ordered.  Testing/Procedures: None ordered.  Follow-Up: Your physician wants you to follow-up as needed with Dr Taylor     Any Other Special Instructions Will Be Listed Below (If Applicable).  If you need a refill on your cardiac medications before your next appointment, please call your pharmacy.   

## 2017-06-18 ENCOUNTER — Other Ambulatory Visit: Payer: Self-pay | Admitting: Adult Health

## 2017-08-13 ENCOUNTER — Other Ambulatory Visit: Payer: Self-pay | Admitting: Internal Medicine

## 2017-09-16 ENCOUNTER — Other Ambulatory Visit: Payer: Self-pay | Admitting: Student

## 2017-09-16 DIAGNOSIS — Z1231 Encounter for screening mammogram for malignant neoplasm of breast: Secondary | ICD-10-CM

## 2017-09-18 ENCOUNTER — Other Ambulatory Visit: Payer: Self-pay | Admitting: Internal Medicine

## 2017-10-01 ENCOUNTER — Other Ambulatory Visit: Payer: Self-pay | Admitting: Adult Health

## 2017-10-09 ENCOUNTER — Encounter: Payer: Self-pay | Admitting: Family

## 2017-10-09 ENCOUNTER — Ambulatory Visit (HOSPITAL_COMMUNITY)
Admission: RE | Admit: 2017-10-09 | Discharge: 2017-10-09 | Disposition: A | Discharge status: Home / Self Care | Payer: Medicare HMO | Source: Ambulatory Visit | Attending: Vascular Surgery | Admitting: Vascular Surgery

## 2017-10-09 ENCOUNTER — Ambulatory Visit: Payer: Medicare HMO | Admitting: Family

## 2017-10-09 ENCOUNTER — Other Ambulatory Visit: Payer: Self-pay

## 2017-10-09 VITALS — BP 135/65 | HR 59 | Temp 97.3°F | Resp 16 | Ht 63.0 in | Wt 228.1 lb

## 2017-10-09 DIAGNOSIS — I82521 Chronic embolism and thrombosis of right iliac vein: Secondary | ICD-10-CM

## 2017-10-09 DIAGNOSIS — Z86711 Personal history of pulmonary embolism: Secondary | ICD-10-CM

## 2017-10-09 DIAGNOSIS — Z9862 Peripheral vascular angioplasty status: Secondary | ICD-10-CM | POA: Diagnosis not present

## 2017-10-09 NOTE — Patient Instructions (Signed)
How to Use Compression Stockings Compression stockings are elastic socks that squeeze the legs. They help to increase blood flow to the legs, decrease swelling in the legs, and reduce the chance of developing blood clots in the lower legs. Compression stockings are often used by people who:  Are recovering from surgery.  Have poor circulation in their legs.  Are prone to getting blood clots in their legs.  Have varicose veins.  Sit or stay in bed for long periods of time.  How to use compression stockings Before you put on your compression stockings:  Make sure that they are the correct size. If you do not know your size, ask your health care provider.  Make sure that they are clean, dry, and in good condition.  Check them for rips and tears. Do not put them on if they are ripped or torn.  Put your stockings on first thing in the morning, before you get out of bed. Keep them on for as long as your health care provider advises. When you are wearing your stockings:  Keep them as smooth as possible. Do not allow them to bunch up. It is especially important to prevent the stockings from bunching up around your toes or behind your knees.  Do not roll the stockings downward and leave them rolled down. This can decrease blood flow to your leg.  Change them right away if they become wet or dirty.  When you take off your stockings, inspect your legs and feet. Anything that does not seem normal may require medical attention. Look for:  Open sores.  Red spots.  Swelling.  Information and tips  Do not stop wearing your compression stockings without talking to your health care provider first.  Wash your stockings every day with mild detergent in cold or warm water. Do not use bleach. Air-dry your stockings or dry them in a clothes dryer on low heat.  Replace your stockings every 3-6 months.  If skin moisturizing is part of your treatment plan, apply lotion or cream at night so that  your skin will be dry when you put on the stockings in the morning. It is harder to put the stockings on when you have lotion on your legs or feet. Contact a health care provider if: Remove your stockings and seek medical care if:  You have a feeling of pins and needles in your feet or legs.  You have any new changes in your skin.  You have skin lesions that are getting worse.  You have swelling or pain that is getting worse.  Get help right away if:  You have numbness or tingling in your lower legs that does not get better right after you take the stockings off.  Your toes or feet become cold and blue.  You develop open sores or red spots on your legs that do not go away.  You see or feel a warm spot on your leg.  You have new swelling or soreness in your leg.  You are short of breath or you have chest pain for no reason.  You have a rapid or irregular heartbeat.  You feel light-headed or dizzy. This information is not intended to replace advice given to you by your health care provider. Make sure you discuss any questions you have with your health care provider. Document Released: 12/29/2008 Document Revised: 08/01/2015 Document Reviewed: 02/08/2014 Elsevier Interactive Patient Education  2018 [employer]     Deep Vein Thrombosis Deep vein thrombosis (  DVT) is a condition in which a blood clot forms in a deep vein, such as a lower leg, thigh, or arm vein. A clot is blood that has thickened into a gel or solid. This condition is dangerous. It can lead to serious and even life-threatening complications if the clot travels to the lungs and causes a blockage (pulmonary embolism). It can also damage veins in the leg. This can result in leg pain, swelling, discoloration, and sores (post-thrombotic syndrome). What are the causes? This condition may be caused by:  A slowdown of blood flow.  Damage to a vein.  A condition that makes blood clot more easily.  What increases  the risk? The following factors may make you more likely to develop this condition:  Being overweight.  Being elderly, especially over age 34.  Sitting or lying down for more than four hours.  Lack of physical activity (sedentary lifestyle).  Being pregnant, giving birth, or having recently given birth.  Taking medicines that contain estrogen.  Smoking.  A history of any of the following: ? Blood clots or blood clotting disease. ? Peripheral vascular disease. ? Inflammatory bowel disease. ? Cancer. ? Heart disease. ? Genetic conditions that affect how blood clots. ? Neurological diseases that affect the legs (leg paresis). ? Injury. ? Major or lengthy surgery. ? A central line placed inside a large vein.  What are the signs or symptoms? Symptoms of this condition include:  Swelling, pain, or tenderness in an arm or leg.  Warmth, redness, or discoloration in an arm or leg.  If the clot is in your leg, symptoms may be more noticeable or worse when you stand or walk. Some people do not have any symptoms. How is this diagnosed? This condition is diagnosed with:  A medical history.  A physical exam.  Tests, such as: ? Blood tests. These are done to see how your blood clots. ? Imaging tests. These are done to check for clots. Tests may include:  Ultrasound.  CT scan.  MRI.  X-ray.  Venogram. For this test, X-rays are taken after a dye is injected into a vein.  How is this treated? Treatment for this condition depends on the cause, your risk for bleeding or developing more clots, and any medical conditions you have. Treatment may include:  Taking blood thinners (also called anticoagulants). These medicines may be taken by mouth, injected under the skin, or injected through an IV tube (catheter). These medicines prevent clots from forming.  Injecting medicine that dissolves blood clots into the affected vein (catheter-directed thrombolysis).  Having surgery.  Surgery may be done to: ? Remove the clot. ? Place a filter in a large vein to catch blood clots before they reach the lungs.  Some treatments may be continued for up to six months. Follow these instructions at home: If you are taking an oral blood thinner:  Take the medicine exactly as told by your health care provider. Some blood thinners need to be taken at the same time every day. Do not skip a dose.  Ask your health care provider about what foods and drugs interact with the medicine.  Ask about possible side effects. General instructions  Blood thinners can cause easy bruising and difficulty stopping bleeding. Because of this, if you are taking or were given a blood thinner: ? Hold pressure over cuts for longer than usual. ? Tell your dentist and other health care providers that you are taking blood thinners before having any procedures that can  cause bleeding. ? Avoid contact sports.  Take over-the-counter and prescription medicines only as told by your health care provider.  Return to your normal activities as told by your health care provider. Ask your health care provider what activities are safe for you.  Wear compression stockings if recommended by your health care provider.  Keep all follow-up visits as told by your health care provider. This is important. How is this prevented? To lower your risk of developing this condition again:  For 30 or more minutes every day, do an activity that: ? Involves moving your arms and legs. ? Increases your heart rate.  When traveling for longer than four hours: ? Exercise your arms and legs every hour. ? Drink plenty of water. ? Avoid drinking alcohol.  Avoid sitting or lying for a long time without moving your legs.  Stay a healthy weight.  If you are a woman who is older than age 83, avoid unnecessary use of medicines that contain estrogen.  Do not use any products that contain nicotine or tobacco, such as cigarettes and  e-cigarettes. This is especially important if you take estrogen medicines. If you need help quitting, ask your health care provider.  Contact a health care provider if:  You miss a dose of your blood thinner.  You have nausea, vomiting, or diarrhea that lasts for more than one day.  Your menstrual period is heavier than usual.  You have unusual bruising. Get help right away if:  You have new or increased pain, swelling, or redness in an arm or leg.  You have numbness or tingling in an arm or leg.  You have shortness of breath.  You have chest pain.  You have a rapid or irregular heartbeat.  You feel light-headed or dizzy.  You cough up blood.  There is blood in your vomit, stool, or urine.  You have a serious fall or accident, or you hit your head.  You have a severe headache or confusion.  You have a cut that will not stop bleeding. These symptoms may represent a serious problem that is an emergency. Do not wait to see if the symptoms will go away. Get medical help right away. Call your local emergency services (911 in the [place] Do not drive yourself to the hospital. Summary  DVT is a condition in which a blood clot forms in a deep vein, such as a lower leg, thigh, or arm vein.  Symptoms can include swelling, warmth, pain, and redness in your leg or arm.  Treatment may include taking blood thinners, injecting medicine that dissolves blood clots,wearing compression stockings, or surgery.  If you are prescribed blood thinners, take them exactly as told. This information is not intended to replace advice given to you by your health care provider. Make sure you discuss any questions you have with your health care provider. Document Released: 03/03/2005 Document Revised: 04/05/2016 Document Reviewed: 04/05/2016 Elsevier Interactive Patient Education  2018 [employer].

## 2017-10-09 NOTE — Progress Notes (Signed)
CC: Follow up s/p thrombectomy of right lower extremity and iliac veins with balloon angioplasty    History of Present Illness  Cassandra [name] is a 62 y.o. (November 18, 1955) female who is s/p pharmacal mechanical thrombectomy of her right lower extremity and iliac veins with balloon angioplasty but no stenting on 07-17-16 by Dr. Randie Heinzain. During that hospitalization she also had acute kidney injury, thrombocytopenia, and leukopenia. She states that her right leg now is feeling fine and she is walking with her only limitation being shortness of breath and she did have a PE in the hospital that was in the periprocedural time. During the procedure we did not find any significant stenosis in the central venous system. She does have compression stockings but has not been wearing them. She does have minimal swelling of her right lower extremity that is not limiting her activities. She does not have any tissue loss or ulceration or skin changes.  Dr. Randie Heinzain evaluated pt on 09-05-16. At that time there was no underlying cause found for the right leg and iliac veins thrombus formation. During her hospitalization she had several medical comorbidities conditions, no origin located with pulmonary embolus. She remained on oxygen and this was her greatest limitation to walking. She does have compression stockings but has not been wearing them. Dr. Randie Heinzain instructed her on wearing compression stockings at least to the knee 20-30 mmHg and she has agreed. Also told her to continue anticoagulation; unsure of a good stop date given we do not have any indication of the cause of her clotting. From Dr. Randie Heinzain standpoint point she should be on the Xarelto for at least 6 months. Follow-up in another 6 months with repeat duplex. In the interim Dr. Randie Heinzain advised walking, compression stocking , and continued anticoagulation.   Pt states she no longer needs supplemental O2.  She had a cardiac ablation in December 2018 for SVT.  She states she  feels much improved in general.  She saw her PCP recently and states her cholesterol is good.  She takes prednisone for an autoimmune disorder, vasculitis.   She states she has developed DM due to need for prednisone use, states her glucose checks at home are normal, no recent A1C result available.   She denies any bleeding issues.  After she takes Xarelto at night she has an itch, takes Benadryl, and does not have the itch, denies oral tissue swelling or hives.  She changed her diet, feels improved, has more energy.    Past Medical History:  Diagnosis Date  . Allergy   . Anxiety   . Complication of anesthesia    difficulty waking up  . Depression   . Dysrhythmia    svt  . GERD (gastroesophageal reflux disease)   . Heart murmur    never seen cardiologist for this  . Hypertension   . Leukocytoclastic vasculitis (HCC)    Dr. Marchelle Gearingamaswamy and Dr. Corliss Skainseveshwar  . Migraines    maybe one a year  . Renal disorder   . Renal insufficiency   . Sleep apnea    cpap  . SVT (supraventricular tachycardia) (HCC)   . Type 2 diabetes mellitus with kidney complication, without long-term current use of insulin (HCC) 05/07/2016  . Wegner's disease (congenital syphilitic osteochondritis)     Social History Social History   Tobacco Use  . Smoking status: Never Smoker  . Smokeless tobacco: Never Used  Substance Use Topics  . Alcohol use: No  . Drug use: No  Family History Family History  Problem Relation Age of Onset  . Clotting disorder Mother 28       Blood clot, foot amputation  . Cervical cancer Mother   . Arthritis Sister   . Heart attack Maternal Grandmother 51  . Breast cancer Sister 55  . Asthma Cousin     Surgical History Past Surgical History:  Procedure Laterality Date  . BUNIONECTOMY Bilateral   . CARPAL TUNNEL RELEASE Right 2002  . JOINT REPLACEMENT Right    knee  . LOWER EXTREMITY VENOGRAPHY Right 07/14/2016   Procedure: Lower Extremity Venography;  Surgeon: Maeola Harman, MD;  [facility] INVASIVE CV LAB;  Service: Cardiovascular;  Laterality: Right;  . PERCUTANEOUS VENOUS THROMBECTOMY,LYSIS WITH INTRAVASCULAR ULTRASOUND (IVUS) Right 07/17/2016   Procedure: PERCUTANEOUS VENOUS THROMBECTOMY AND LYSIS WITH INTRAVASCULAR ULTRASOUND (IVUS) of right lower extermity with balloon angioplasty;  Surgeon: Maeola Harman, MD;  Location: [facility] OR;  Service: Vascular;  Laterality: Right;  . SVT ABLATION N/A 02/20/2017   Procedure: SVT ABLATION;  Surgeon: Marinus Maw, MD;  Location: [facility] INVASIVE CV LAB;  Service: Cardiovascular;  Laterality: N/A;  . TONSILLECTOMY  08/2002  . TOTAL KNEE ARTHROPLASTY Right   . TOTAL KNEE ARTHROPLASTY Left 02/21/2014   Procedure: LEFT TOTAL KNEE ARTHROPLASTY;  Surgeon: Velna Ochs, MD;  Location: [facility] OR;  Service: Orthopedics;  Laterality: Left;  . TUBAL LIGATION  1980  . ULTRASOUND GUIDANCE FOR VASCULAR ACCESS Right 07/17/2016   Procedure: ULTRASOUND GUIDANCE FOR VASCULAR ACCESS;  Surgeon: Maeola Harman, MD;  Location: [facility] OR;  Service: Vascular;  Laterality: Right;  . VENOGRAM Right 07/17/2016   Procedure: right ASCENDING VENOGRAM WITH ANGIOJET;  Surgeon: Maeola Harman, MD;  Location: [facility] OR;  Service: Vascular;  Laterality: Right;    Allergies  Allergen Reactions  . Nitrofurantoin Itching and Shortness Of Breath  . Effexor [Venlafaxine] Hives  . Myrbetriq [Mirabegron] Other (See Comments)    BACK PAIN DRY MOUTH  . Nsaids Other (See Comments) and Hives    Hypersensitivity vasculitis VASCULITIS  . Reglan [Metoclopramide] Other (See Comments)    Blister in mouth   . Clarithromycin     UNSPECIFIED REACTION   . Flagyl [Metronidazole]     UNSPECIFIED REACTION   . Paxil [Paroxetine]     UNSPECIFIED REACTION   . Ampicillin Rash  . Codeine Other (See Comments)    Head feels like its crawling  . Fluoxetine Hcl Itching    Deep Itch   . Sulfa Antibiotics Other (See Comments) and Nausea And  Vomiting    Stomach irritation Stomach irritation  . Tetracycline Nausea And Vomiting    Severe stomach pain    Current Outpatient Medications  Medication Sig Dispense Refill  . cetirizine (ZYRTEC) 10 MG tablet Take 10 mg by mouth daily.    . furosemide (LASIX) 20 MG tablet Take 20 mg by mouth daily.    . predniSONE (DELTASONE) 5 MG tablet Take 5 mg by mouth daily with breakfast.    . sertraline (ZOLOFT) 100 MG tablet TAKE 1 TABLET BY MOUTH EVERY NIGHT AT BEDTIME 90 tablet 3  . VESICARE 5 MG tablet Take 5 mg by mouth daily.    Carlena Hurl 20 MG TABS tablet TAKE 1 TABLET EVERY DAY WITH SUPPER 30 tablet 3  . atorvastatin (LIPITOR) 20 MG tablet Take 20 mg by mouth daily.    . Cholecalciferol (VITAMIN D3) 5000 units TABS Take 5,000 Units by mouth daily.     Marland Kitchen  DEXILANT 30 MG capsule Take 1 capsule (30 mg total) by mouth daily. 90 capsule 1  . metoprolol tartrate (LOPRESSOR) 25 MG tablet Take 25 mg by mouth daily.    . mycophenolate (CELLCEPT) 500 MG tablet Take 1,000 mg by mouth 2 (two) times daily.    . ondansetron (ZOFRAN-ODT) 4 MG disintegrating tablet Take 4 mg by mouth every 6 (six) hours as needed for nausea.     . ONE TOUCH ULTRA TEST test strip USE 1 STRIP VIA METER THREE TIMES A DAY  2  . pseudoephedrine (SUDAFED) 30 MG tablet Take 60 mg by mouth 2 (two) times daily as needed for congestion.     No current facility-administered medications for this visit.     On ROS today: See HPI for pertinent positives and negatives.  Physical Examination  Vitals:   10/09/17 0847  BP: 135/65  Pulse: (!) 59  Resp: 16  Temp: (!) 97.3 F (36.3 C)  TempSrc: Oral  SpO2: 100%  Weight: 228 lb 1.6 oz (103.5 kg)  Height: 5\' 3"  (1.6 m)   Body mass index is 40.41 kg/m.  General: A&O x 3, WD morbidly obese female. HEENT: Grossly intact and WNL.  Pulmonary: Sym exp, good air movement in all fields, CTAB, no rales, rhonchi, or wheezing. Cardiac: Regular rate and rhythm, no detected  murmur.  Vascular: Vessel Right Left  Radial 2+Palpable 2+Palpable  Brachial Palpable Palpable  Carotid  without bruit  without bruit  Aorta Not palpable N/A  Femoral 2+Palpable 2+Palpable  Popliteal Not palpable Not palpable  PT Not Palpable Not Palpable  DP 2+Palpable 1+Palpable   Gastrointestinal: NTND, -G/R, - HSM, - masses, - CVAT B. Large soft abdomen.  Musculoskeletal: M/S 5/5 throughout, Extremities without ischemic changes. Knee high compression hose in place.  Skin: No rash, no cellulitis, no ulcers noted.  Neurologic: Pain and light touch intact in extremities, Motor exam as listed above. CN 2-12 intact except is hard of hearing with hearing aid in place.  Psychiatric: Normal thought content, mood appropriate to clinical situation.    DATA  BLE IVC Iliac vein Duplex (Date: 10-09-17):  Mild vein wall thickening seen in the right common femoral vein consistent with chronic thrombus. The right common femoral and femoral veins demonstrate deep venous incompetence. IVC/Iliac: No evidence of thrombus in IVC and Iliac veins. Was evidence of chronic thrombus in the proximal thigh segment of the right femoral vein on the exam of 03-20-17, but no mention of this on today's exam. 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    Medical Decision Making  Cassandra [name] is a 62 y.o. female whowho is s/p pharmacal mechanical thrombectomy of her right lower extremity and iliac veins with balloon angioplasty but no stenting on 07-17-16 by Dr. Randie Heinz. No etiology was found for the thrombus.   I discussed with Dr. Randie Heinz whether or not pt should continue Xarelto; weighing the risks of IC bleed vs recurrence of PE and worsening of DVT.   Pt is having no bleeding issues, is not a fall risk, and feels better than when I saw her last, denies dyspnea.   Will continue Xarelto for another year.   Xarelto 20 mg po daily reordered at pt request for 90 days, 3 refills. I advised pt to notify us  immediately if she develops any bleeding issues, or any issues related to Xarelto.    Based on the patient's vascular studies and examination, and after discussing with Dr. Randie Heinz, I  have offered the patient: return in 1 year with IVC-iliac vein duplex bilateral, see Dr. Randie Heinz afterward   Continue to wear 20-30 mm Hg knee high graduated compression hose during the day.   Continue to walk at least 30 minutes daily.    Thank you for allowing [place] to participate in this patient's care.  Charisse March, RN, MSN, FNP-C [facility] Office: [phone]  Clinic MD: Randie Heinz  10/09/2017, 8:54 AM

## 2017-11-17 ENCOUNTER — Ambulatory Visit: Payer: Medicare HMO

## 2017-12-11 ENCOUNTER — Other Ambulatory Visit: Payer: Self-pay | Admitting: Adult Health

## 2017-12-15 NOTE — Telephone Encounter (Signed)
Dr. Marchelle Gearing please advise if we can refill Xarelto?

## 2018-12-09 ENCOUNTER — Other Ambulatory Visit: Payer: Self-pay | Admitting: Student

## 2018-12-09 DIAGNOSIS — Z1231 Encounter for screening mammogram for malignant neoplasm of breast: Secondary | ICD-10-CM

## 2019-01-13 ENCOUNTER — Ambulatory Visit
Admission: RE | Admit: 2019-01-13 | Discharge: 2019-01-13 | Disposition: A | Discharge status: Home / Self Care | Payer: Medicare HMO | Source: Ambulatory Visit | Attending: Student | Admitting: Student

## 2019-01-13 DIAGNOSIS — Z1231 Encounter for screening mammogram for malignant neoplasm of breast: Secondary | ICD-10-CM | POA: Insufficient documentation

## 2019-02-21 DIAGNOSIS — D709 Neutropenia, unspecified: Secondary | ICD-10-CM

## 2019-02-21 DIAGNOSIS — R319 Hematuria, unspecified: Secondary | ICD-10-CM | POA: Insufficient documentation

## 2019-02-21 DIAGNOSIS — R809 Proteinuria, unspecified: Secondary | ICD-10-CM | POA: Insufficient documentation

## 2019-02-21 DIAGNOSIS — N2581 Secondary hyperparathyroidism of renal origin: Secondary | ICD-10-CM | POA: Insufficient documentation

## 2019-02-21 DIAGNOSIS — I10 Essential (primary) hypertension: Secondary | ICD-10-CM | POA: Insufficient documentation

## 2019-02-21 HISTORY — DX: Neutropenia, unspecified: D70.9

## 2019-04-20 ENCOUNTER — Encounter: Payer: Self-pay | Admitting: Cardiovascular Disease

## 2019-05-26 ENCOUNTER — Ambulatory Visit: Payer: Medicare HMO | Admitting: Cardiovascular Disease

## 2019-06-16 ENCOUNTER — Telehealth: Payer: Self-pay | Admitting: Nurse Practitioner

## 2019-06-16 ENCOUNTER — Ambulatory Visit: Payer: Medicare HMO | Admitting: Dermatology

## 2019-06-16 NOTE — Telephone Encounter (Signed)
Received call from patient who states she has not had any problems with SVT since her ablation 12/18. She is scheduling an appointment for general cardiology follow-up. I confirmed the date and time of her appointment and she thanked me for the call.

## 2019-06-16 NOTE — Telephone Encounter (Signed)
Follow up   Pt is returning call    

## 2019-06-16 NOTE — Telephone Encounter (Signed)
Left message for patient requesting a call back to determine appropriate provider for patient to be scheduled to see.

## 2019-06-27 ENCOUNTER — Ambulatory Visit: Payer: Medicare HMO | Admitting: Cardiovascular Disease

## 2019-06-28 ENCOUNTER — Other Ambulatory Visit: Payer: Self-pay

## 2019-06-28 ENCOUNTER — Ambulatory Visit: Payer: Medicare HMO | Admitting: Cardiovascular Disease

## 2019-06-28 ENCOUNTER — Encounter: Payer: Self-pay | Admitting: Cardiovascular Disease

## 2019-06-28 VITALS — BP 122/70 | HR 63 | Ht 63.0 in | Wt 210.0 lb

## 2019-06-28 DIAGNOSIS — I471 Supraventricular tachycardia: Secondary | ICD-10-CM | POA: Diagnosis not present

## 2019-06-28 DIAGNOSIS — I82521 Chronic embolism and thrombosis of right iliac vein: Secondary | ICD-10-CM | POA: Diagnosis not present

## 2019-06-28 NOTE — Patient Instructions (Signed)
Medication Instructions:  Your physician recommends that you continue on your current medications as directed. Please refer to the Current Medication list given to you today.  *If you need a refill on your cardiac medications before your next appointment, please call your pharmacy*   Lab Work: None Ordered If you have labs (blood work) drawn today and your tests are completely normal, you will receive your results only by: . MyChart Message (if you have MyChart) OR . A paper copy in the mail If you have any lab test that is abnormal or we need to change your treatment, we will call you to review the results.   Testing/Procedures: None Ordered   Follow-Up: At [facility], you and your health needs are our priority.  As part of our continuing mission to provide you with exceptional heart care, we have created designated Provider Care Teams.  These Care Teams include your primary Cardiologist (physician) and Advanced Practice Providers (APPs -  Physician Assistants and Nurse Practitioners) who all work together to provide you with the care you need, when you need it.   Your next appointment:    As Needed  The format for your next appointment:   Either In Person or Virtual  Provider:   You may see Philip [name], MD or one of the following Advanced Practice Providers on your designated Care Team:    Scott Weaver, PA-C  Vin Bhagat, PA-C  Janine Hammond, NP     

## 2019-06-28 NOTE — Progress Notes (Signed)
Cassandra [name] Date of Birth  07-07-1955       [facility] Office [address], [address], [name], [place]  [address]   [place], [place]  [address] [place]   Fax  [phone]     Fax [phone]  Problem List: 1. Palpitations 2. Knee pain - needs L knee replacement.   Has had right TKA .  : Previous notes:   Cassandra [name] is a 74 with hx of SVT.  She had a prolonged episode of SVT in April , 2015 requiring a visit to the ED .  She received IV adenosine with resolution of the SVT.  She now takes PRN Diltiazem. She has not had any severe episodes since then .    Non smoker No ETOH, Fhx:   Maternal grandparents died of MI.   Jul 25, 2016:  [name] is seen today for a follow up for a recent ER visit for SVT.  Presented to the ER with SVT - HR of 236.   She had tunnel vision and near syncope.   Resolved with IV adenosine  She has felt short of breath since that time .  She was started on Metoprolol 25 mg BID and has been feeling better   June 28, 2019: Problem list 1.  Supraventricular tachycardia 2. Hyperlipidemia 3.  Chronic kidney disease  [name] seen back today for follow-up of her supraventricular tachycardia.  Her SVT was documented in 2015 and is very rapid.  She had tunnel vision with near syncope.  It resolved with adenosine.  She has been on metoprolol 25 mg twice a day but this was stopped for some reason she cannot remember   She denies any severe episodes of tachycardia.  She still has occasional palpitations that sound consistent with premature ventricular contractions.  Current Outpatient Medications on File Prior to Visit  Medication Sig Dispense Refill  . atorvastatin (LIPITOR) 20 MG tablet Take 20 mg by mouth daily.    Marland Kitchen azelastine (ASTELIN) 0.1 % nasal spray Place into the nose.    . CHOLECALCIFEROL PO Take 500 Units by mouth daily.    Marland Kitchen DEXILANT 30 MG capsule Take 1 capsule (30 mg  total) by mouth daily. 90 capsule 1  . KEFLEX 500 MG capsule Take      2 pills 1 hr before dental visit and 2 pills 1 hr after dental visit.    Marland Kitchen montelukast (SINGULAIR) 10 MG tablet Take by mouth.    . Multiple Vitamin (MULTI-VITAMIN) tablet Take by mouth daily.    . mycophenolate (CELLCEPT) 500 MG tablet Take 1,000 mg by mouth 2 (two) times daily.    Marland Kitchen oxybutynin (DITROPAN-XL) 5 MG 24 hr tablet Take by mouth.    . pseudoephedrine (SUDAFED) 30 MG tablet Take 60 mg by mouth 2 (two) times daily as needed for congestion.    . sertraline (ZOLOFT) 100 MG tablet TAKE 1 TABLET BY MOUTH EVERY NIGHT AT BEDTIME 90 tablet 3   No current facility-administered medications on file prior to visit.    Allergies  Allergen Reactions  . Nitrofurantoin Itching and Shortness Of Breath  . Effexor [Venlafaxine] Hives  . Myrbetriq [Mirabegron] Other (See Comments)    BACK PAIN DRY MOUTH  . Nsaids Other (See Comments) and Hives    Hypersensitivity vasculitis VASCULITIS  . Reglan [Metoclopramide] Other (See Comments)    Blister in mouth   .  Clarithromycin     UNSPECIFIED REACTION   . Flagyl [Metronidazole]     UNSPECIFIED REACTION   . Paxil [Paroxetine]     UNSPECIFIED REACTION   . Ampicillin Rash  . Codeine Other (See Comments)    Head feels like its crawling  . Fluoxetine Hcl Itching    Deep Itch   . Sulfa Antibiotics Other (See Comments) and Nausea And Vomiting    Stomach irritation Stomach irritation  . Tetracycline Nausea And Vomiting    Severe stomach pain    Past Medical History:  Diagnosis Date  . Allergy   . Anxiety   . Complication of anesthesia    difficulty waking up  . Depression   . Dysrhythmia    svt  . GERD (gastroesophageal reflux disease)   . Heart murmur    never seen cardiologist for this  . Hypertension   . Leukocytoclastic vasculitis (HCC)    Dr. Marchelle Gearing and Dr. Corliss Skains  . Migraines    maybe one a year  . Renal disorder   . Renal insufficiency   . Sleep  apnea    cpap  . SVT (supraventricular tachycardia) (HCC)   . Type 2 diabetes mellitus with kidney complication, without long-term current use of insulin (HCC) 05/07/2016  . Verrucae vulgaris   . Wegner's disease (congenital syphilitic osteochondritis)     Past Surgical History:  Procedure Laterality Date  . BUNIONECTOMY Bilateral   . CARPAL TUNNEL RELEASE Right 2002  . JOINT REPLACEMENT Right    knee  . LOWER EXTREMITY VENOGRAPHY Right 07/14/2016   Procedure: Lower Extremity Venography;  Surgeon: Maeola Harman, MD;  Location: [facility] INVASIVE CV LAB;  Service: Cardiovascular;  Laterality: Right;  . PERCUTANEOUS VENOUS THROMBECTOMY,LYSIS WITH INTRAVASCULAR ULTRASOUND (IVUS) Right 07/17/2016   Procedure: PERCUTANEOUS VENOUS THROMBECTOMY AND LYSIS WITH INTRAVASCULAR ULTRASOUND (IVUS) of right lower extermity with balloon angioplasty;  Surgeon: Maeola Harman, MD;  Location: [facility] OR;  Service: Vascular;  Laterality: Right;  . SVT ABLATION N/A 02/20/2017   Procedure: SVT ABLATION;  Surgeon: Marinus Maw, MD;  Location[facility] INVASIVE CV LAB;  Service: Cardiovascular;  Laterality: N/A;  . TONSILLECTOMY  08/2002  . TOTAL KNEE ARTHROPLASTY Right   . TOTAL KNEE ARTHROPLASTY Left 02/21/2014   Procedure: LEFT TOTAL KNEE ARTHROPLASTY;  Surgeon: Velna Ochs, MD;  Location: [facility] OR;  Service: Orthopedics;  Laterality: Left;  . TUBAL LIGATION  1980  . ULTRASOUND GUIDANCE FOR VASCULAR ACCESS Right 07/17/2016   Procedure: ULTRASOUND GUIDANCE FOR VASCULAR ACCESS;  Surgeon: Maeola Harman, MD;  Location: [facility] OR;  Service: Vascular;  Laterality: Right;  . VENOGRAM Right 07/17/2016   Procedure: right ASCENDING VENOGRAM WITH ANGIOJET;  Surgeon: Maeola Harman, MD;  Location: [facility] OR;  Service: Vascular;  Laterality: Right;    Social History   Tobacco Use  Smoking Status Never Smoker  Smokeless Tobacco Never Used    Social History   Substance and Sexual Activity  Alcohol  Use No    Family History  Problem Relation Age of Onset  . Clotting disorder Mother 71       Blood clot, foot amputation  . Cervical cancer Mother   . Arthritis Sister   . Heart attack Maternal Grandmother 51  . Breast cancer Sister 49  . Asthma Cousin     Reviw of Systems:  Reviewed in the HPI.  All other systems are negative.  Physical Exam: Blood pressure 122/70, pulse 63, height 5\' 3"  (1.6 m), weight 210  lb (95.3 kg), SpO2 98 %.  GEN:  Well nourished, well developed in no acute distress HEENT: Normal NECK: No JVD; No carotid bruits LYMPHATICS: No lymphadenopathy CARDIAC: RRR , no murmurs, rubs, gallops RESPIRATORY:  Clear to auscultation without rales, wheezing or rhonchi  ABDOMEN: Soft, non-tender, non-distended MUSCULOSKELETAL:  No edema; No deformity  SKIN: Warm and dry NEUROLOGIC:  Alert and oriented x 3   ECG: June 28, 2019: Normal sinus rhythm at 63.  No ST or T wave changes.   1. SVT :   Maelle has not had any further episodes of SVT.  She has stopped taking her metoprolol and actually did not remember ever taking the medication.  Her heart rate is fairly slow at baseline.  We will not restart the metoprolol but will treat her as needed.  She will continue to follow-up with her primary medical doctor and her nephrologist.  We will see her back on an as-needed basis.  Kristeen Miss, MD  06/28/2019 2:11 PM    [facility] [address] [place],  [address] [place], [place]  [address] Pager [phone] Phone: [phone]; Fax: [phone]

## 2019-07-26 DIAGNOSIS — Z03818 Encounter for observation for suspected exposure to other biological agents ruled out: Secondary | ICD-10-CM | POA: Diagnosis not present

## 2019-07-26 DIAGNOSIS — Z20828 Contact with and (suspected) exposure to other viral communicable diseases: Secondary | ICD-10-CM | POA: Diagnosis not present

## 2019-08-01 DIAGNOSIS — J0100 Acute maxillary sinusitis, unspecified: Secondary | ICD-10-CM | POA: Diagnosis not present

## 2019-08-10 DIAGNOSIS — Z889 Allergy status to unspecified drugs, medicaments and biological substances status: Secondary | ICD-10-CM | POA: Diagnosis not present

## 2019-08-10 DIAGNOSIS — J209 Acute bronchitis, unspecified: Secondary | ICD-10-CM | POA: Diagnosis not present

## 2019-08-10 DIAGNOSIS — B9689 Other specified bacterial agents as the cause of diseases classified elsewhere: Secondary | ICD-10-CM | POA: Diagnosis not present

## 2019-08-10 DIAGNOSIS — J019 Acute sinusitis, unspecified: Secondary | ICD-10-CM | POA: Diagnosis not present

## 2019-08-25 DIAGNOSIS — G4733 Obstructive sleep apnea (adult) (pediatric): Secondary | ICD-10-CM | POA: Diagnosis not present

## 2019-08-25 DIAGNOSIS — I776 Arteritis, unspecified: Secondary | ICD-10-CM | POA: Diagnosis not present

## 2019-08-25 DIAGNOSIS — E669 Obesity, unspecified: Secondary | ICD-10-CM | POA: Diagnosis not present

## 2019-08-25 DIAGNOSIS — Z889 Allergy status to unspecified drugs, medicaments and biological substances status: Secondary | ICD-10-CM | POA: Diagnosis not present

## 2019-09-12 ENCOUNTER — Ambulatory Visit: Payer: Self-pay | Admitting: Dermatology

## 2019-10-05 DIAGNOSIS — T7840XA Allergy, unspecified, initial encounter: Secondary | ICD-10-CM | POA: Diagnosis not present

## 2019-11-09 DIAGNOSIS — Z1231 Encounter for screening mammogram for malignant neoplasm of breast: Secondary | ICD-10-CM | POA: Diagnosis not present

## 2019-11-14 DIAGNOSIS — E782 Mixed hyperlipidemia: Secondary | ICD-10-CM | POA: Diagnosis not present

## 2019-11-14 DIAGNOSIS — E559 Vitamin D deficiency, unspecified: Secondary | ICD-10-CM | POA: Diagnosis not present

## 2019-11-14 DIAGNOSIS — E1129 Type 2 diabetes mellitus with other diabetic kidney complication: Secondary | ICD-10-CM | POA: Diagnosis not present

## 2019-11-14 DIAGNOSIS — I129 Hypertensive chronic kidney disease with stage 1 through stage 4 chronic kidney disease, or unspecified chronic kidney disease: Secondary | ICD-10-CM | POA: Diagnosis not present

## 2019-11-18 DIAGNOSIS — I10 Essential (primary) hypertension: Secondary | ICD-10-CM | POA: Diagnosis not present

## 2019-11-18 DIAGNOSIS — E782 Mixed hyperlipidemia: Secondary | ICD-10-CM | POA: Diagnosis not present

## 2019-11-18 DIAGNOSIS — F419 Anxiety disorder, unspecified: Secondary | ICD-10-CM | POA: Diagnosis not present

## 2019-11-18 DIAGNOSIS — N058 Unspecified nephritic syndrome with other morphologic changes: Secondary | ICD-10-CM | POA: Diagnosis not present

## 2019-11-18 DIAGNOSIS — I471 Supraventricular tachycardia: Secondary | ICD-10-CM | POA: Diagnosis not present

## 2019-11-18 DIAGNOSIS — E1129 Type 2 diabetes mellitus with other diabetic kidney complication: Secondary | ICD-10-CM | POA: Diagnosis not present

## 2019-11-18 DIAGNOSIS — N2581 Secondary hyperparathyroidism of renal origin: Secondary | ICD-10-CM | POA: Diagnosis not present

## 2019-11-18 DIAGNOSIS — Z1331 Encounter for screening for depression: Secondary | ICD-10-CM | POA: Diagnosis not present

## 2019-11-18 DIAGNOSIS — Z0000 Encounter for general adult medical examination without abnormal findings: Secondary | ICD-10-CM | POA: Diagnosis not present

## 2019-11-18 DIAGNOSIS — N057 Unspecified nephritic syndrome with diffuse crescentic glomerulonephritis: Secondary | ICD-10-CM | POA: Diagnosis not present

## 2019-11-30 DIAGNOSIS — M810 Age-related osteoporosis without current pathological fracture: Secondary | ICD-10-CM | POA: Diagnosis not present

## 2019-12-01 DIAGNOSIS — H0102B Squamous blepharitis left eye, upper and lower eyelids: Secondary | ICD-10-CM | POA: Diagnosis not present

## 2019-12-01 DIAGNOSIS — H0102A Squamous blepharitis right eye, upper and lower eyelids: Secondary | ICD-10-CM | POA: Diagnosis not present

## 2019-12-01 DIAGNOSIS — H2513 Age-related nuclear cataract, bilateral: Secondary | ICD-10-CM | POA: Diagnosis not present

## 2019-12-01 DIAGNOSIS — E119 Type 2 diabetes mellitus without complications: Secondary | ICD-10-CM | POA: Diagnosis not present

## 2019-12-01 DIAGNOSIS — H0015 Chalazion left lower eyelid: Secondary | ICD-10-CM | POA: Diagnosis not present

## 2019-12-02 DIAGNOSIS — M810 Age-related osteoporosis without current pathological fracture: Secondary | ICD-10-CM | POA: Insufficient documentation

## 2019-12-13 DIAGNOSIS — R809 Proteinuria, unspecified: Secondary | ICD-10-CM | POA: Diagnosis not present

## 2019-12-13 DIAGNOSIS — N017 Rapidly progressive nephritic syndrome with diffuse crescentic glomerulonephritis: Secondary | ICD-10-CM | POA: Diagnosis not present

## 2019-12-13 DIAGNOSIS — E1122 Type 2 diabetes mellitus with diabetic chronic kidney disease: Secondary | ICD-10-CM | POA: Diagnosis not present

## 2019-12-13 DIAGNOSIS — I129 Hypertensive chronic kidney disease with stage 1 through stage 4 chronic kidney disease, or unspecified chronic kidney disease: Secondary | ICD-10-CM | POA: Diagnosis not present

## 2019-12-13 DIAGNOSIS — R319 Hematuria, unspecified: Secondary | ICD-10-CM | POA: Diagnosis not present

## 2019-12-19 DIAGNOSIS — E1122 Type 2 diabetes mellitus with diabetic chronic kidney disease: Secondary | ICD-10-CM | POA: Diagnosis not present

## 2019-12-19 DIAGNOSIS — I129 Hypertensive chronic kidney disease with stage 1 through stage 4 chronic kidney disease, or unspecified chronic kidney disease: Secondary | ICD-10-CM | POA: Diagnosis not present

## 2019-12-19 DIAGNOSIS — N1831 Chronic kidney disease, stage 3a: Secondary | ICD-10-CM | POA: Diagnosis not present

## 2019-12-19 DIAGNOSIS — N017 Rapidly progressive nephritic syndrome with diffuse crescentic glomerulonephritis: Secondary | ICD-10-CM | POA: Diagnosis not present

## 2019-12-19 DIAGNOSIS — R809 Proteinuria, unspecified: Secondary | ICD-10-CM | POA: Diagnosis not present

## 2019-12-19 DIAGNOSIS — D631 Anemia in chronic kidney disease: Secondary | ICD-10-CM | POA: Diagnosis not present

## 2019-12-19 DIAGNOSIS — N2581 Secondary hyperparathyroidism of renal origin: Secondary | ICD-10-CM | POA: Diagnosis not present

## 2019-12-19 DIAGNOSIS — R319 Hematuria, unspecified: Secondary | ICD-10-CM | POA: Diagnosis not present

## 2020-01-06 DIAGNOSIS — G4733 Obstructive sleep apnea (adult) (pediatric): Secondary | ICD-10-CM | POA: Diagnosis not present

## 2020-02-17 DIAGNOSIS — Z23 Encounter for immunization: Secondary | ICD-10-CM | POA: Diagnosis not present

## 2020-02-17 DIAGNOSIS — Z880 Allergy status to penicillin: Secondary | ICD-10-CM | POA: Diagnosis not present

## 2020-02-17 DIAGNOSIS — T886XXS Anaphylactic reaction due to adverse effect of correct drug or medicament properly administered, sequela: Secondary | ICD-10-CM | POA: Diagnosis not present

## 2020-03-07 DIAGNOSIS — H8110 Benign paroxysmal vertigo, unspecified ear: Secondary | ICD-10-CM | POA: Diagnosis not present

## 2020-05-19 ENCOUNTER — Ambulatory Visit (HOSPITAL_COMMUNITY)
Admission: EM | Admit: 2020-05-19 | Discharge: 2020-05-19 | Disposition: A | Discharge status: Home / Self Care | Payer: Medicare HMO | Attending: Medical Oncology | Admitting: Medical Oncology

## 2020-05-19 ENCOUNTER — Encounter (HOSPITAL_COMMUNITY): Payer: Self-pay | Admitting: Emergency Medicine

## 2020-05-19 ENCOUNTER — Other Ambulatory Visit: Payer: Self-pay

## 2020-05-19 DIAGNOSIS — Z885 Allergy status to narcotic agent status: Secondary | ICD-10-CM | POA: Insufficient documentation

## 2020-05-19 DIAGNOSIS — Z881 Allergy status to other antibiotic agents status: Secondary | ICD-10-CM | POA: Diagnosis not present

## 2020-05-19 DIAGNOSIS — Z886 Allergy status to analgesic agent status: Secondary | ICD-10-CM | POA: Insufficient documentation

## 2020-05-19 DIAGNOSIS — Z20822 Contact with and (suspected) exposure to covid-19: Secondary | ICD-10-CM

## 2020-05-19 DIAGNOSIS — Z882 Allergy status to sulfonamides status: Secondary | ICD-10-CM | POA: Insufficient documentation

## 2020-05-19 DIAGNOSIS — R059 Cough, unspecified: Secondary | ICD-10-CM | POA: Diagnosis not present

## 2020-05-19 DIAGNOSIS — U071 COVID-19: Secondary | ICD-10-CM | POA: Diagnosis not present

## 2020-05-19 DIAGNOSIS — Z888 Allergy status to other drugs, medicaments and biological substances status: Secondary | ICD-10-CM | POA: Diagnosis not present

## 2020-05-19 DIAGNOSIS — Z880 Allergy status to penicillin: Secondary | ICD-10-CM | POA: Insufficient documentation

## 2020-05-19 DIAGNOSIS — Z79899 Other long term (current) drug therapy: Secondary | ICD-10-CM | POA: Insufficient documentation

## 2020-05-19 MED ORDER — BENZONATATE 100 MG PO CAPS: 100.0000 mg | ORAL_CAPSULE | Freq: Three times a day (TID) | ORAL | 0 refills | Status: DC

## 2020-05-19 MED ORDER — FLUTICASONE PROPIONATE 50 MCG/ACT NA SUSP: 2.0000 | Freq: Every day | NASAL | 0 refills | Status: DC

## 2020-05-19 NOTE — ED Triage Notes (Signed)
Patient started feeling bad around 3 or 4 this morning.  Patient right side of chest tightness, dry cough.  Then patient started having generalized aching, chills, and sneezing.  Reports her sister has covid and they do live together

## 2020-05-19 NOTE — ED Provider Notes (Signed)
[facility]    CSN: [id number] Arrival date & time: 05/19/20  1615      History   Chief Complaint Cold Symptoms  [name]   HPI   Cold Symptoms: Pt reports that early this morning she started to feel badly with symptoms of dry cough, chest tightness with coughing, chills, body aches, sneezing. She reports that this sister currently has COVID and they live together. She has taken her normal allergy medication along with tylenol with no significant improvement. She requests COVID-19 testing.    Past Medical History:  Diagnosis Date  . Allergy   . Anxiety   . Complication of anesthesia    difficulty waking up  . Depression   . Dysrhythmia    svt  . GERD (gastroesophageal reflux disease)   . Heart murmur    never seen cardiologist for this  . Hypertension   . Leukocytoclastic vasculitis (HCC)    Dr. Marchelle Gearingamaswamy and Dr. Corliss Skainseveshwar  . Migraines    maybe one a year  . Renal disorder   . Renal insufficiency   . Sleep apnea    cpap  . SVT (supraventricular tachycardia) (HCC)   . Type 2 diabetes mellitus with kidney complication, without long-term current use of insulin (HCC) 05/07/2016  . Verrucae vulgaris   . Wegner's disease (congenital syphilitic osteochondritis)     Patient Active Problem List   Diagnosis Date Noted  . Sustained SVT (HCC) 02/20/2017  . Hyperlipidemia, mixed 09/05/2016  . Pulmonary embolism (HCC) 08/12/2016  . AKI (acute kidney injury) (HCC) 07/09/2016  . Dehydration 07/09/2016  . Anemia 07/09/2016  . Prolonged QT interval 07/09/2016  . DVT (deep venous thrombosis) (HCC) 07/09/2016  . Right leg swelling 07/08/2016  . 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 Type 2 diabetes mellitus with kidney complication, without long-term current use of insulin (HCC) 05/07/2016  . Hemoptysis 03/16/2016  . Positive ANA (antinuclear antibody) 03/15/2016  . Pulmonary vasculitis (HCC)  03/13/2016  . OAB (overactive bladder) 03/04/2016  . History of gross hematuria 02/20/2016  . Heart palpitations 09/03/2015  . Seasonal allergic rhinitis due to pollen 09/03/2015  . Primary osteoarthritis of left knee 02/21/2014  . 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 Primary osteoarthritis of knee 02/21/2014  . SVT (supraventricular tachycardia) (HCC) 02/17/2014  . Leukocytoclastic vasculitis (HCC)   . 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 Anxiety and depression   . Depression   . GERD (gastroesophageal reflux disease)   . Dyspnea and respiratory abnormality 01/03/2009  . HEPATITIS C 12/07/2008  . VASCULITIS 12/07/2008  . MICROSCOPIC HEMATURIA 12/07/2008  . HEADACHE, CHRONIC 11/30/2008  . COUGH 11/30/2008    Past Surgical History:  Procedure Laterality Date  . BUNIONECTOMY Bilateral   . CARPAL TUNNEL RELEASE Right 2002  . JOINT REPLACEMENT Right    knee  . LOWER EXTREMITY VENOGRAPHY Right 07/14/2016   Procedure: Lower Extremity Venography;  Surgeon: Maeola HarmanBrandon Christopher Cain, MD;  [facility];  Service: Cardiovascular;  Laterality: Right;  . PERCUTANEOUS VENOUS THROMBECTOMY,LYSIS WITH INTRAVASCULAR ULTRASOUND (IVUS) Right 07/17/2016   Procedure: PERCUTANEOUS VENOUS THROMBECTOMY AND LYSIS WITH INTRAVASCULAR ULTRASOUND (IVUS) of right lower extermity with balloon angioplasty;  Surgeon: Maeola HarmanBrandon Christopher Cain, MD;  [facility];  Service: Vascular;  Laterality: Right;  . SVT ABLATION N/A 02/20/2017   Procedure: SVT ABLATION;  Surgeon: Marinus Mawaylor, Gregg W, MD;  [facility];  Service: Cardiovascular;  Laterality: N/A;  . TONSILLECTOMY  08/2002  . TOTAL KNEE ARTHROPLASTY Right   . TOTAL KNEE ARTHROPLASTY Left 02/21/2014   Procedure: LEFT TOTAL KNEE ARTHROPLASTY;  Surgeon: Velna Ochs, MD;  Location: [facility] OR;  Service: Orthopedics;  Laterality: Left;  . TUBAL LIGATION  1980  . ULTRASOUND GUIDANCE FOR VASCULAR  ACCESS Right 07/17/2016   Procedure: ULTRASOUND GUIDANCE FOR VASCULAR ACCESS;  Surgeon: Maeola Harman, MD;  Location: [facility] OR;  Service: Vascular;  Laterality: Right;  . VENOGRAM Right 07/17/2016   Procedure: right ASCENDING VENOGRAM WITH ANGIOJET;  Surgeon: Maeola Harman, MD;  Location: [facility] OR;  Service: Vascular;  Laterality: Right;    OB History   No obstetric history on file.      Home Medications    Prior to Admission medications   Medication Sig Start Date End Date Taking? Authorizing Provider  alendronate (FOSAMAX) 70 MG tablet Take 70 mg by mouth once a week. Take with a full glass of water on an empty stomach.   Yes [provider]  benzonatate (TESSALON) 100 MG capsule Take 1 capsule (100 mg total) by mouth every 8 (eight) hours. 05/19/20  Yes Clent Jacks M, PA-C  DEXILANT 30 MG capsule Take 1 capsule (30 mg total) by mouth daily. 02/12/17  Yes Kalman Shan, MD  fluticasone (FLONASE) 50 MCG/ACT nasal spray Place 2 sprays into both nostrils daily. 05/19/20  Yes Tangela Dolliver, Maralyn Sago M, PA-C  furosemide (LASIX) 20 MG tablet Take 20 mg by mouth.   Yes [provider]  Multiple Vitamin (MULTI-VITAMIN) tablet Take by mouth daily.   Yes [provider]  mycophenolate (CELLCEPT) 500 MG tablet Take 1,000 mg by mouth 2 (two) times daily. 10/23/16  Yes [provider]  oxybutynin (DITROPAN-XL) 5 MG 24 hr tablet Take by mouth. 02/18/18  Yes [provider]  sertraline (ZOLOFT) 100 MG tablet TAKE 1 TABLET BY MOUTH EVERY NIGHT AT BEDTIME 01/31/14  Yes Donita Brooks, MD  atorvastatin (LIPITOR) 20 MG tablet Take 20 mg by mouth daily. 09/05/16 06/28/19  [provider]  azelastine (ASTELIN) 0.1 % nasal spray Place into the nose. 05/19/19   [provider]  CHOLECALCIFEROL PO Take 500 Units by mouth daily.    [provider]  KEFLEX 500 MG capsule Take      2 pills 1 hr before dental visit and 2 pills 1 hr after  dental visit. 06/14/19   [provider]  montelukast (SINGULAIR) 10 MG tablet Take by mouth. 05/17/19 05/16/20  [provider]  pseudoephedrine (SUDAFED) 30 MG tablet Take 60 mg by mouth 2 (two) times daily as needed for congestion.    [provider]    Family History Family History  Problem Relation Age of Onset  . Clotting disorder Mother 55       Blood clot, foot amputation  . Cervical cancer Mother   . Arthritis Sister   . Heart attack Maternal Grandmother 51  . Breast cancer Sister 46  . Asthma Cousin     Social History Social History   Tobacco Use  . Smoking status: Never Smoker  . Smokeless tobacco: Never Used  Substance Use Topics  . Alcohol use: No  . Drug use: No     Allergies   Nitrofurantoin, Effexor [venlafaxine], Myrbetriq [mirabegron], Nsaids, Reglan [metoclopramide], Clarithromycin, Flagyl [metronidazole], Paxil [paroxetine], Ampicillin, Codeine, Fluoxetine hcl, Sulfa antibiotics, and Tetracycline   Review of Systems Review of Systems  As stated above in HPI Physical  Exam Triage Vital Signs ED Triage Vitals [05/19/20 1707]  Enc Vitals Group     BP      Pulse      Resp      Temp      Temp src      SpO2      Weight      Height      Head Circumference      Peak Flow      Pain Score 0     Pain Loc      Pain Edu?      Excl. in GC?    No data found.  Updated Vital Signs BP 140/60 (BP Location: Left Arm) Comment (BP Location): large cuff  Pulse 68   Temp 98.6 F (37 C) (Oral)   Resp (!) 22   SpO2 96%   Physical Exam Vitals and nursing note reviewed.  Constitutional:      General: She is not in acute distress.    Appearance: Normal appearance. She is obese. She is not ill-appearing, toxic-appearing or diaphoretic.  HENT:     Head: Normocephalic and atraumatic.     Right Ear: Tympanic membrane, ear canal and external ear normal. There is no impacted cerumen.     Left Ear: Tympanic membrane, ear canal and  external ear normal. There is no impacted cerumen.     Nose: Nose normal. No congestion or rhinorrhea.     Mouth/Throat:     Mouth: Mucous membranes are moist.     Pharynx: No oropharyngeal exudate or posterior oropharyngeal erythema.  Eyes:     Extraocular Movements: Extraocular movements intact.     Conjunctiva/sclera: Conjunctivae normal.     Pupils: Pupils are equal, round, and reactive to light.  Cardiovascular:     Rate and Rhythm: Normal rate and regular rhythm.     Heart sounds: Murmur heard.    Pulmonary:     Effort: Pulmonary effort is normal. No respiratory distress.     Breath sounds: Normal breath sounds. No stridor. No wheezing, rhonchi or rales.  Chest:     Chest wall: No tenderness.  Musculoskeletal:     Cervical back: Normal range of motion and neck supple. No rigidity.  Lymphadenopathy:     Cervical: No cervical adenopathy.  Skin:    General: Skin is warm.  Neurological:     Mental Status: She is alert.      UC Treatments / Results  Labs (all labs ordered are listed, but only abnormal results are displayed) Labs Reviewed  SARS CORONAVIRUS 2 (TAT 6-24 HRS)    EKG   Radiology No results found.  Procedures Procedures (including critical care time)  Medications Ordered in UC Medications - No data to display  Initial Impression / Assessment and Plan / UC Course  I have reviewed the triage vital signs and the nursing notes.  Pertinent labs & imaging results that were available during my care of the patient were reviewed by me and considered in my medical decision making (see chart for details).     New. Discussed holding off on testing as she likely has COVID-19 given symptoms and exposure as this would not change my management and there is a high rate of a false negative result. Treating with tessalon and flonase. Discussed how to use along with common potential side effects and precautions. Red flag signs and symptoms discussed.  Final Clinical  Impressions(s) / UC Diagnoses   Final diagnoses:  Close exposure to COVID-19  virus  Cough   Discharge Instructions   None    ED Prescriptions    Medication Sig Dispense Auth. Provider   benzonatate (TESSALON) 100 MG capsule Take 1 capsule (100 mg total) by mouth every 8 (eight) hours. 21 capsule Sol Englert M, PA-C   fluticasone [facility]) 50 MCG/ACT nasal spray Place 2 sprays into both nostrils daily. 16 g Eon Zunker M, [place]     PDMP not reviewed this encounter.   Rushie Chestnut, [place] 05/19/20 1734

## 2020-05-20 LAB — SARS CORONAVIRUS 2 (TAT 6-24 HRS): SARS Coronavirus 2: POSITIVE — AB

## 2020-05-21 ENCOUNTER — Telehealth: Payer: Self-pay

## 2020-05-21 NOTE — Telephone Encounter (Signed)
Called to discuss with patient about COVID-19 symptoms and the use of one of the available treatments for those with mild to moderate Covid symptoms and at a high risk of hospitalization.  Pt appears to qualify for outpatient treatment due to co-morbid conditions and/or a member of an at-risk group in accordance with the FDA Emergency Use Authorization.    Symptom onset: 05/19/20 Vaccinated: Yes Booster? No Immunocompromised? No Qualifiers: HTN,DM,Renal insuffiencey   Pt. Will think about it. Given call back number.   Esther Hardy

## 2020-06-25 ENCOUNTER — Ambulatory Visit: Payer: Self-pay | Admitting: Urology

## 2020-06-25 NOTE — Progress Notes (Deleted)
06/26/2020 4:32 PM   Cassandra [name] 08/04/1955 [id number]  Referring provider: Carren Rang, PA-C [address] [place],  [place] [address]  No chief complaint on file.   HPI: 65 year old female who presents today for further evaluation of urinary frequency.  Notably, she is recently seen and evaluated on 05/27/2018 for urinary frequency.  Her urine was unremarkable but she was diagnosed with a UTI regardless and treated with Omnicef.  Urine culture was negative.   PMH: Past Medical History:  Diagnosis Date  . Allergy   . Anxiety   . Complication of anesthesia    difficulty waking up  . Depression   . Dysrhythmia    svt  . GERD (gastroesophageal reflux disease)   . Heart murmur    never seen cardiologist for this  . Hypertension   . Leukocytoclastic vasculitis (HCC)    Dr. Marchelle Gearing and Dr. Corliss Skains  . Migraines    maybe one a year  . Renal disorder   . Renal insufficiency   . Sleep apnea    cpap  . SVT (supraventricular tachycardia) (HCC)   . Type 2 diabetes mellitus with kidney complication, without long-term current use of insulin (HCC) 05/07/2016  . Verrucae vulgaris   . Wegner's disease (congenital syphilitic osteochondritis)     Surgical History: Past Surgical History:  Procedure Laterality Date  . BUNIONECTOMY Bilateral   . CARPAL TUNNEL RELEASE Right 2002  . JOINT REPLACEMENT Right    knee  . LOWER EXTREMITY VENOGRAPHY Right 07/14/2016   Procedure: Lower Extremity Venography;  Surgeon: Maeola Harman, MD;  Location: [facility] INVASIVE CV LAB;  Service: Cardiovascular;  Laterality: Right;  . PERCUTANEOUS VENOUS THROMBECTOMY,LYSIS WITH INTRAVASCULAR ULTRASOUND (IVUS) Right 07/17/2016   Procedure: PERCUTANEOUS VENOUS THROMBECTOMY AND LYSIS WITH INTRAVASCULAR ULTRASOUND (IVUS) of right lower extermity with balloon angioplasty;  Surgeon: Maeola Harman, MD;  Location: [facility] OR;  Service: Vascular;  Laterality: Right;  . SVT  ABLATION N/A 02/20/2017   Procedure: SVT ABLATION;  Surgeon: Marinus Maw, MD;  Location: [facility] INVASIVE CV LAB;  Service: Cardiovascular;  Laterality: N/A;  . TONSILLECTOMY  08/2002  . TOTAL KNEE ARTHROPLASTY Right   . TOTAL KNEE ARTHROPLASTY Left 02/21/2014   Procedure: LEFT TOTAL KNEE ARTHROPLASTY;  Surgeon: Velna Ochs, MD;  Location: [facility] OR;  Service: Orthopedics;  Laterality: Left;  . TUBAL LIGATION  1980  . ULTRASOUND GUIDANCE FOR VASCULAR ACCESS Right 07/17/2016   Procedure: ULTRASOUND GUIDANCE FOR VASCULAR ACCESS;  Surgeon: Maeola Harman, MD;  Location: [facility] OR;  Service: Vascular;  Laterality: Right;  . VENOGRAM Right 07/17/2016   Procedure: right ASCENDING VENOGRAM WITH ANGIOJET;  Surgeon: Maeola Harman, MD;  Location: [facility] OR;  Service: Vascular;  Laterality: Right;    Home Medications:  Allergies as of 06/26/2020      Reactions   Nitrofurantoin Itching, Shortness Of Breath   Effexor [venlafaxine] Hives   Myrbetriq [mirabegron] Other (See Comments)   BACK PAIN DRY MOUTH   Nsaids Other (See Comments), Hives   Hypersensitivity vasculitis VASCULITIS   Reglan [metoclopramide] Other (See Comments)   Blister in mouth    Clarithromycin    UNSPECIFIED REACTION    Flagyl [metronidazole]    UNSPECIFIED REACTION    Paxil [paroxetine]    UNSPECIFIED REACTION    Ampicillin Rash   Codeine Other (See Comments)   Head feels like its crawling   Fluoxetine Hcl Itching   Deep Itch   Sulfa Antibiotics Other (See Comments),  Nausea And Vomiting   Stomach irritation Stomach irritation   Tetracycline Nausea And Vomiting   Severe stomach pain      Medication List       Accurate as of June 25, 2020  4:32 PM. If you have any questions, ask your nurse or doctor.        alendronate 70 MG tablet Commonly known as: FOSAMAX Take 70 mg by mouth once a week. Take with a full glass of water on an empty stomach.   atorvastatin 20 MG tablet Commonly known as:  LIPITOR Take 20 mg by mouth daily.   azelastine 0.1 % nasal spray Commonly known as: ASTELIN Place into the nose.   benzonatate 100 MG capsule Commonly known as: TESSALON Take 1 capsule (100 mg total) by mouth every 8 (eight) hours.   CHOLECALCIFEROL PO Take 500 Units by mouth daily.   Dexilant 30 MG capsule Generic drug: Dexlansoprazole Take 1 capsule (30 mg total) by mouth daily.   fluticasone 50 MCG/ACT nasal spray Commonly known as: FLONASE Place 2 sprays into both nostrils daily.   furosemide 20 MG tablet Commonly known as: LASIX Take 20 mg by mouth.   Keflex 500 MG capsule Generic drug: cephALEXin Take      2 pills 1 hr before dental visit and 2 pills 1 hr after dental visit.   montelukast 10 MG tablet Commonly known as: SINGULAIR Take by mouth.   Multi-Vitamin tablet Take by mouth daily.   mycophenolate 500 MG tablet Commonly known as: CELLCEPT Take 1,000 mg by mouth 2 (two) times daily.   oxybutynin 5 MG 24 hr tablet Commonly known as: DITROPAN-XL Take by mouth.   pseudoephedrine 30 MG tablet Commonly known as: SUDAFED Take 60 mg by mouth 2 (two) times daily as needed for congestion.   sertraline 100 MG tablet Commonly known as: ZOLOFT TAKE 1 TABLET BY MOUTH EVERY NIGHT AT BEDTIME       Allergies:  Allergies  Allergen Reactions  . Nitrofurantoin Itching and Shortness Of Breath  . Effexor [Venlafaxine] Hives  . Myrbetriq [Mirabegron] Other (See Comments)    BACK PAIN DRY MOUTH  . Nsaids Other (See Comments) and Hives    Hypersensitivity vasculitis VASCULITIS  . Reglan [Metoclopramide] Other (See Comments)    Blister in mouth   . Clarithromycin     UNSPECIFIED REACTION   . Flagyl [Metronidazole]     UNSPECIFIED REACTION   . Paxil [Paroxetine]     UNSPECIFIED REACTION   . Ampicillin Rash  . Codeine Other (See Comments)    Head feels like its crawling  . Fluoxetine Hcl Itching    Deep Itch   . Sulfa Antibiotics Other (See Comments)  and Nausea And Vomiting    Stomach irritation Stomach irritation  . Tetracycline Nausea And Vomiting    Severe stomach pain    Family History: Family History  Problem Relation Age of Onset  . Clotting disorder Mother 25       Blood clot, foot amputation  . Cervical cancer Mother   . Arthritis Sister   . Heart attack Maternal Grandmother 51  . Breast cancer Sister 64  . Asthma Cousin     Social History:  reports that she has never smoked. She has never used smokeless tobacco. She reports that she does not drink alcohol and does not use drugs.   Physical Exam: There were no vitals taken for this visit.  Constitutional:  Alert and oriented, No acute distress. HEENT: [place] AT,  moist mucus membranes.  Trachea midline, no masses. Cardiovascular: No clubbing, cyanosis, or edema. Respiratory: Normal respiratory effort, no increased work of breathing. GI: Abdomen is soft, nontender, nondistended, no abdominal masses GU: No CVA tenderness Lymph: No cervical or inguinal lymphadenopathy. Skin: No rashes, bruises or suspicious lesions. Neurologic: Grossly intact, no focal deficits, moving all 4 extremities. Psychiatric: Normal mood and affect.  Laboratory Data: Lab Results  Component Value Date   WBC 4.5 02/11/2017   HGB 11.7 02/11/2017   HCT 36.0 02/11/2017   MCV 92 02/11/2017   PLT 163 02/11/2017    Lab Results  Component Value Date   CREATININE 1.19 (H) 02/11/2017    No results found for: PSA  No results found for: TESTOSTERONE  No results found for: HGBA1C  Urinalysis    Component Value Date/Time   COLORURINE COLORLESS (A) 07/20/2016 1617   APPEARANCEUR CLEAR 07/20/2016 1617   LABSPEC 1.002 (L) 07/20/2016 1617   PHURINE 7.0 07/20/2016 1617   GLUCOSEU 50 (A) 07/20/2016 1617   GLUCOSEU NEGATIVE 11/30/2008 1522   HGBUR SMALL (A) 07/20/2016 1617   BILIRUBINUR NEGATIVE 07/20/2016 1617   KETONESUR NEGATIVE 07/20/2016 1617   PROTEINUR NEGATIVE 07/20/2016 1617    UROBILINOGEN 1 06/02/2013 1524   NITRITE NEGATIVE 07/20/2016 1617   LEUKOCYTESUR NEGATIVE 07/20/2016 1617    Lab Results  Component Value Date   MUCUS Presence of 11/30/2008   BACTERIA NONE SEEN 07/20/2016    Pertinent Imaging: *** No results found for this or any previous visit.  No results found for this or any previous visit.  No results found for this or any previous visit.  No results found for this or any previous visit.  Results for orders placed during the hospital encounter of 03/13/16  US Renal  Narrative CLINICAL DATA:  Hematuria.  Right flank pain for 2-3 weeks.  EXAM: RENAL / URINARY TRACT ULTRASOUND COMPLETE  COMPARISON:  12/24/2015  FINDINGS: Right Kidney:  Length: 10.9 cm. Echogenicity within normal limits. No mass or hydronephrosis visualized.  Left Kidney:  Length: 10.6 cm. Echogenicity within normal limits. No mass or hydronephrosis visualized.  Bladder:  Appears normal for degree of bladder distention.  IMPRESSION: Normal exam.   Electronically Signed By: Norva Pavlov M.D. On: 03/14/2016 17:58  No results found for this or any previous visit.  No results found for this or any previous visit.  No results found for this or any previous visit.   Assessment & Plan:    There are no diagnoses linked to this encounter.  No follow-ups on file.  Vanna Scotland, MD  [facility] [address], [address] [place], [place] [address] [phone]

## 2020-06-26 ENCOUNTER — Ambulatory Visit: Payer: Self-pay | Admitting: Urology

## 2020-07-03 NOTE — Progress Notes (Signed)
07/06/2020  9:21 AM   Cassandra [name] 1955-03-20 [id number]  Referring provider: Carren Rang, PA-C [address] [place],  [place] [address] Chief Complaint  Patient presents with  . Urinary Incontinence    HPI: Cassandra [name] is a 66 y.o. 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  Patient reports longstanding history of urinary urgency, especially right after waking up in the morning. Most of the time she cant get up fast enough and she has an accident before making it to the bathroom. She reports this has been worse, intermittent over the past 2 to 3 months but is been going on for several years.. She reported that she has been wearing about 2 Depends a day.   She reports incontinence with laughing, crying, sneezing or squatting.   Reports having occasional UTIs with associated LUTS.  Symptoms might have been exacerbated during COVID. Taking 5 mg XL of oxybutin (prescribed last year). Then she was prescribed something new, not sure what it was, but she said that it has not really had any effect on her, positive or negative.  She has had 2 vaginal births, over 8 lbs. She did not have this issue before her children were born. Still has uterus. Reports some sensation of vaginal bulging and/or prolapse.  She said that she is regular if she eats enough fiber. She reported that she has had some weight loss, but since COVID she has gained some weight back.  Today her PVR 20.  Never smoker.  PMH: Past Medical History:  Diagnosis Date  . Allergy   . Anxiety   . Complication of anesthesia    difficulty waking up  . Depression   . Dysrhythmia    svt  . GERD (gastroesophageal reflux disease)   . Heart murmur    never seen cardiologist for this  . Hypertension   . Leukocytoclastic  vasculitis (HCC)    Dr. Marchelle Gearing and Dr. Corliss Skains  . Migraines    maybe one a year  . Renal disorder   . Renal insufficiency   . Sleep apnea    cpap  . SVT (supraventricular tachycardia) (HCC)   . Type 2 diabetes mellitus with kidney complication, without long-term current use of insulin (HCC) 05/07/2016  . Verrucae vulgaris   . Wegner's disease (congenital syphilitic osteochondritis)     Surgical History: Past Surgical History:  Procedure Laterality Date  . BUNIONECTOMY Bilateral   . CARPAL TUNNEL RELEASE Right 2002  . JOINT REPLACEMENT Right    knee  . LOWER EXTREMITY VENOGRAPHY Right 07/14/2016   Procedure: Lower Extremity Venography;  Surgeon: Maeola Harman, MD;  Location: [facility] INVASIVE CV LAB;  Service: Cardiovascular;  Laterality: Right;  . PERCUTANEOUS VENOUS THROMBECTOMY,LYSIS WITH INTRAVASCULAR ULTRASOUND (IVUS) Right 07/17/2016   Procedure: PERCUTANEOUS VENOUS THROMBECTOMY AND LYSIS WITH INTRAVASCULAR ULTRASOUND (IVUS) of right lower extermity with balloon angioplasty;  Surgeon: Maeola Harman, MD;  Location: [facility] OR;  Service: Vascular;  Laterality: Right;  . SVT ABLATION N/A 02/20/2017   Procedure: SVT ABLATION;  Surgeon: Marinus Maw, MD;  Location: [facility] INVASIVE CV LAB;  Service: Cardiovascular;  Laterality: N/A;  . TONSILLECTOMY  08/2002  . TOTAL KNEE ARTHROPLASTY Right   . TOTAL KNEE ARTHROPLASTY Left 02/21/2014   Procedure: LEFT TOTAL KNEE ARTHROPLASTY;  Surgeon: Velna Ochs, MD;  Location: [facility] OR;  Service:  Orthopedics;  Laterality: Left;  . TUBAL LIGATION  1980  . ULTRASOUND GUIDANCE FOR VASCULAR ACCESS Right 07/17/2016   Procedure: ULTRASOUND GUIDANCE FOR VASCULAR ACCESS;  Surgeon: Maeola Harman, MD;  Location: [facility] OR;  Service: Vascular;  Laterality: Right;  . VENOGRAM Right 07/17/2016   Procedure: right ASCENDING VENOGRAM WITH ANGIOJET;  Surgeon: Maeola Harman, MD;  Location: [facility] OR;  Service: Vascular;  Laterality: Right;     Home Medications:  Allergies as of 07/04/2020      Reactions   Nitrofurantoin Itching, Shortness Of Breath   Effexor [venlafaxine] Hives   Myrbetriq [mirabegron] Other (See Comments)   BACK PAIN DRY MOUTH   Nsaids Other (See Comments), Hives   Hypersensitivity vasculitis VASCULITIS   Reglan [metoclopramide] Other (See Comments)   Blister in mouth    Clarithromycin    UNSPECIFIED REACTION    Flagyl [metronidazole]    UNSPECIFIED REACTION    Paxil [paroxetine]    UNSPECIFIED REACTION    Tolterodine Other (See Comments)   Ampicillin Rash   Cholecalciferol Rash   Codeine Other (See Comments)   Head feels like its crawling   Fluoxetine Hcl Itching   Deep Itch   Sulfa Antibiotics Other (See Comments), Nausea And Vomiting   Stomach irritation Stomach irritation   Tetracycline Nausea And Vomiting   Severe stomach pain      Medication List       Accurate as of July 04, 2020 11:59 PM. If you have any questions, ask your nurse or doctor.        STOP taking these medications   benzonatate 100 MG capsule Commonly known as: TESSALON Stopped by: Vanna Scotland, MD   Dexilant 30 MG capsule Generic drug: Dexlansoprazole Stopped by: Vanna Scotland, MD   Keflex 500 MG capsule Generic drug: cephALEXin Stopped by: Vanna Scotland, MD   mycophenolate 500 MG tablet Commonly known as: CELLCEPT Stopped by: Vanna Scotland, MD     TAKE these medications   albuterol 108 (90 Base) MCG/ACT inhaler Commonly known as: VENTOLIN HFA SMARTSIG:2 Inhalation Via Inhaler Every 4 Hours PRN   alendronate 70 MG tablet Commonly known as: FOSAMAX Take 70 mg by mouth once a week. Take with a full glass of water on an empty stomach.   atorvastatin 20 MG tablet Commonly known as: LIPITOR Take 20 mg by mouth daily.   azelastine 0.1 % nasal spray Commonly known as: ASTELIN Place into the nose.   cetirizine 10 MG tablet Commonly known as: ZYRTEC Take by mouth.   CHOLECALCIFEROL  PO Take 500 Units by mouth daily.   clotrimazole 10 MG troche Commonly known as: MYCELEX Take by mouth.   fluticasone 50 MCG/ACT nasal spray Commonly known as: FLONASE Place 2 sprays into both nostrils daily.   furosemide 20 MG tablet Commonly known as: LASIX Take 20 mg by mouth.   hydrocortisone 25 MG suppository Commonly known as: ANUSOL-HC Place rectally.   hydrOXYzine 25 MG tablet Commonly known as: ATARAX/VISTARIL Take by mouth.   meclizine 25 MG tablet Commonly known as: ANTIVERT Take by mouth.   montelukast 10 MG tablet Commonly known as: SINGULAIR Take by mouth.   Multi-Vitamin tablet Take by mouth daily.   oxybutynin 15 MG 24 hr tablet Commonly known as: DITROPAN XL Take 1 tablet (15 mg total) by mouth daily. What changed:   medication strength  how much to take  when to take this Changed by: Vanna Scotland, MD   pseudoephedrine 30 MG tablet Commonly known as:  SUDAFED Take 60 mg by mouth 2 (two) times daily as needed for congestion.   sertraline 100 MG tablet Commonly known as: ZOLOFT TAKE 1 TABLET BY MOUTH EVERY NIGHT AT BEDTIME       Allergies: Allergies  Allergen Reactions  . Nitrofurantoin Itching and Shortness Of Breath  . Effexor [Venlafaxine] Hives  . Myrbetriq [Mirabegron] Other (See Comments)    BACK PAIN DRY MOUTH  . Nsaids Other (See Comments) and Hives    Hypersensitivity vasculitis VASCULITIS  . Reglan [Metoclopramide] Other (See Comments)    Blister in mouth   . Clarithromycin     UNSPECIFIED REACTION   . Flagyl [Metronidazole]     UNSPECIFIED REACTION   . Paxil [Paroxetine]     UNSPECIFIED REACTION   . Tolterodine Other (See Comments)  . Ampicillin Rash  . Cholecalciferol Rash  . Codeine Other (See Comments)    Head feels like its crawling  . Fluoxetine Hcl Itching    Deep Itch   . Sulfa Antibiotics Other (See Comments) and Nausea And Vomiting    Stomach irritation Stomach irritation  . Tetracycline Nausea  And Vomiting    Severe stomach pain    Family History: Family History  Problem Relation Age of Onset  . Clotting disorder Mother 55       Blood clot, foot amputation  . Cervical cancer Mother   . Arthritis Sister   . Heart attack Maternal Grandmother 51  . Breast cancer Sister 81  . Asthma Cousin     Social History:   reports that she has never smoked. She has never used smokeless tobacco. She reports that she does not drink alcohol and does not use drugs.  ROS: Pertinent ROS in HPI.  Physical Exam: BP 138/85   Pulse 86   Ht 5\' 3"  (1.6 m)   Wt 221 lb (100.2 kg)   BMI 39.15 kg/m   Constitutional:  Alert and oriented, No acute distress. Obese. HEENT: [place] AT, moist mucus membranes.  Trachea midline, no masses. Cardiovascular: No clubbing, cyanosis, or edema. Respiratory: Normal respiratory effort, no increased work of breathing. Skin: No rashes, bruises or suspicious lesions. Neurologic: Grossly intact, no focal deficits, moving all 4 extremities. Psychiatric: Normal mood and affect.  Laboratory Data:  Lab Results  Component Value Date   CREATININE 1.19 (H) 02/11/2017   UA negative, see Epic  I have reviewed the labs.  Pertinent Imaging PVR was 20  Assessment & Plan:    1. Stress incontinence Counseled on weight loss.  Kegel exercises recommended, directions provided-we will refer to PT if she fails to improve with self-guided exercises  She would like to avoid surgery if possible  2. Urinary Urge incontinence Discussed pathophysiology  Increase dose of oxybutin to 15 mg XL per day. Currently on a low dose 5 mg which is not made a big difference. Advised to start a stool softener, because this can cause constipation, and she already has a propensity for that.  Expressed some vaginal bulging. Will do pelvic exam to assess for prolapse.   Follow Up:  RTC in 1 month, to discuss any new urianry symptoms and pelvic exam  I, 02/13/2017, am acting as a  scribe for Dr. Wyn Quaker.   I have reviewed the above documentation for accuracy and completeness, and I agree with the above.   Vanna Scotland, MD     [facility] [address] [place], [place] [phone]

## 2020-07-04 ENCOUNTER — Encounter: Payer: Self-pay | Admitting: Urology

## 2020-07-04 ENCOUNTER — Ambulatory Visit: Payer: Medicare HMO | Admitting: Urology

## 2020-07-04 ENCOUNTER — Other Ambulatory Visit: Payer: Self-pay

## 2020-07-04 VITALS — BP 138/85 | HR 86 | Ht 63.0 in | Wt 221.0 lb

## 2020-07-04 DIAGNOSIS — N3281 Overactive bladder: Secondary | ICD-10-CM | POA: Diagnosis not present

## 2020-07-04 LAB — BLADDER SCAN AMB NON-IMAGING: Scan Result: 20

## 2020-07-04 MED ORDER — OXYBUTYNIN CHLORIDE ER 15 MG PO TB24: 15.0000 mg | ORAL_TABLET | Freq: Every day | ORAL | 11 refills | Status: DC

## 2020-07-06 LAB — URINALYSIS, COMPLETE
Bilirubin, UA: NEGATIVE
Glucose, UA: NEGATIVE
Ketones, UA: NEGATIVE
Leukocytes,UA: NEGATIVE
Nitrite, UA: NEGATIVE
Protein,UA: NEGATIVE
RBC, UA: NEGATIVE
Specific Gravity, UA: 1.01 (ref 1.005–1.030)
Urobilinogen, Ur: 0.2 mg/dL (ref 0.2–1.0)
pH, UA: 5 (ref 5.0–7.5)

## 2020-07-06 LAB — MICROSCOPIC EXAMINATION
Bacteria, UA: NONE SEEN
RBC, Urine: NONE SEEN /hpf (ref 0–2)

## 2020-08-15 ENCOUNTER — Ambulatory Visit: Payer: Medicare HMO | Admitting: Physician Assistant

## 2020-08-15 ENCOUNTER — Other Ambulatory Visit: Payer: Self-pay

## 2020-08-15 VITALS — BP 147/87 | HR 83 | Ht 63.0 in | Wt 222.0 lb

## 2020-08-15 DIAGNOSIS — N3281 Overactive bladder: Secondary | ICD-10-CM

## 2020-08-15 DIAGNOSIS — N819 Female genital prolapse, unspecified: Secondary | ICD-10-CM

## 2020-08-15 LAB — BLADDER SCAN AMB NON-IMAGING: Scan Result: 6

## 2020-08-15 NOTE — Patient Instructions (Signed)
Pelvic Organ Prolapse Pelvic organ prolapse is a condition in women that involves the stretching, bulging, or dropping of pelvic organs into an abnormal position, past the opening of the vagina. It happens when the muscles and tissues that surround and support pelvic structures become weak or stretched. Pelvic organ prolapse can involve the:  Vagina (vaginal prolapse).  Uterus (uterine prolapse).  Bladder (cystocele).  Rectum (rectocele).  Intestines (enterocele). When organs other than the vagina are involved, they often bulge into the vagina or protrude from the vagina, depending on how severe the prolapse is. What are the causes? This condition may be caused by:  Pregnancy, labor, and childbirth.  Past pelvic surgery.  Lower levels of the hormone estrogen due to menopause.  Consistently lifting more than 50 lb (23 kg).  Obesity.  Long-term difficulty passing stool (chronic constipation).  Long-term, or chronic, cough.  Fluid buildup in the abdomen due to certain conditions. What are the signs or symptoms? Symptoms of this condition include:  Leaking a little urine (loss of bladder control) when you cough, sneeze, strain, and exercise (stress incontinence). This may be worse immediately after childbirth. It may gradually improve over time.  Feeling pressure in your pelvis or vagina. This pressure may increase when you cough or when you are passing stool.  A bulge that protrudes from the opening of your vagina.  Difficulty passing urine or stool.  Pain in your lower back.  Pain or discomfort during sex, or decreased interest in sex.  Repeated bladder infections (urinary tract infections).  Difficulty inserting a tampon. In some people, this condition causes no symptoms. How is this diagnosed? This condition may be diagnosed based on a vaginal and rectal exam. During the exam, you may be asked to cough and strain while you are lying down, sitting, and standing up.  Your health care provider will determine if other tests are required, such as bladder function tests. How is this treated? Treatment for this condition may depend on your symptoms. Treatment may include:  Lifestyle changes, such as drinking plenty of fluids and eating foods that are high in fiber.  Emptying your bladder at scheduled times (bladder training therapy). This can help reduce or avoid urinary incontinence.  Estrogen. This may help mild prolapse by increasing the strength and tone of pelvic floor muscles.  Kegel exercises. These may help mild cases of prolapse by strengthening and tightening the muscles of the pelvic floor.  A soft, flexible device that helps support the vaginal walls and keep pelvic organs in place (pessary). This is inserted into your vagina by your health care provider.  Surgery. This is often the only form of treatment for severe prolapse. Follow these instructions at home: Eating and drinking  Avoid drinking beverages that contain caffeine or alcohol.  Increase your intake of high-fiber foods to decrease constipation and straining during bowel movements. Activity  Lose weight if recommended by your health care provider.  Avoid heavy lifting and straining with exercise and work. Do not hold your breath when you perform mild to moderate lifting and exercise activities. Limit your activities as directed by your health care provider.  Do Kegel exercises as directed by your health care provider. To do this: ? Squeeze your pelvic floor muscles tight. You should feel a tight lift in your rectal area and a tightness in your vaginal area. Keep your stomach, buttocks, and legs relaxed. ? Hold the muscles tight for up to 10 seconds. Then relax your muscles. ? Repeat this exercise   50 times a day, or as much as told by your health care provider. Continue to do this exercise for at least 4-6 weeks, or for as long as told by your health care provider. General  instructions  Take over-the-counter and prescription medicines only as told by your health care provider.  Wear a sanitary pad or adult diapers if you have urinary incontinence.  If you have a pessary, take care of it as told by your health care provider.  Keep all follow-up visits. This is important. Contact a health care provider if you:  Have symptoms that interfere with your daily activities or sex life.  Need medicine to help with the discomfort.  Notice bleeding from your vagina that is not related to your menstrual period.  Have a fever.  Have pain or bleeding when you urinate.  Have bleeding when you pass stool.  Pass urine when you have sex.  Have chronic constipation.  Have a pessary that falls out.  Have a foul-smelling vaginal discharge.  Have an unusual, low pain in your abdomen. Get help right away if you:  Cannot pass urine. Summary  Pelvic organ prolapse is the stretching, bulging, or dropping of pelvic organs into an abnormal position. It happens when the muscles and tissues that surround and support pelvic structures become weak or stretched.  When organs other than the vagina are involved, they often bulge into the vagina or protrude from it, depending on how severe the prolapse is.  In most cases, this condition needs to be treated only if it produces symptoms. Treatment may include lifestyle changes, estrogen, Kegel exercises, pessary insertion, or surgery.  Avoid heavy lifting and straining with exercise and work. Do not hold your breath when you perform mild to moderate lifting and exercise activities. Limit your activities as directed by your health care provider. This information is not intended to replace advice given to you by your health care provider. Make sure you discuss any questions you have with your health care provider. Document Revised: 08/29/2019 Document Reviewed: 08/29/2019 Elsevier Patient Education  2021 Elsevier Inc.  

## 2020-08-15 NOTE — Progress Notes (Signed)
08/15/2020 1:27 PM   Cassandra [name] [birth year] [id number]  CC: Chief Complaint  Patient presents with  . Over Active Bladder    HPI: Cassandra [name] is a 65 y.o. female with PMH OAB wet with mixed incontinence who presents today for symptom recheck on oxybutynin XL 15mg .   Today she reports improvement in her urgency and incontinence, but she is still having urgency primarily at night that is bothersome for her. Additionally, she reports occasional difficulty with bowel movements such that she will strain to pass stool but then be unable to do so despite the sensation of fullness in the rectal vault. PVR 36mL.  PMH: Past Medical History:  Diagnosis Date  . Allergy   . Anxiety   . Complication of anesthesia    difficulty waking up  . Depression   . Dysrhythmia    svt  . GERD (gastroesophageal reflux disease)   . Heart murmur    never seen cardiologist for this  . Hypertension   . Leukocytoclastic vasculitis (HCC)    Dr. 11m and Dr. Marchelle Gearing  . Migraines    maybe one a year  . Renal disorder   . Renal insufficiency   . Sleep apnea    cpap  . SVT (supraventricular tachycardia) (HCC)   . Type 2 diabetes mellitus with kidney complication, without long-term current use of insulin (HCC) 05/07/2016  . Verrucae vulgaris   . Wegner's disease (congenital syphilitic osteochondritis)     Surgical History: Past Surgical History:  Procedure Laterality Date  . BUNIONECTOMY Bilateral   . CARPAL TUNNEL RELEASE Right 2002  . JOINT REPLACEMENT Right    knee  . LOWER EXTREMITY VENOGRAPHY Right 07/14/2016   Procedure: Lower Extremity Venography;  Surgeon: 07/16/2016, MD;  Location: [facility] INVAS[facility] CV LAB;  Service: Cardiovascular;  Laterality: Right;  . PERCUTANEOUS VENOUS THROMBECTOMY,LYSIS WITH INTRAVASCULAR ULTRASOUND (IVUS) Right 07/17/2016   Procedure: PERCUTANEOUS VENOUS THROMBECTOMY AND LYSIS WITH INTRAVASCULAR ULTRASOUND (IVUS) of right lower extermity with  balloon angioplasty;  Surgeon: 09/16/2016, MD;  Location: [facility] OR;  Service: Vascular;  Laterality: Right;  . SVT ABLATION N/A 02/20/2017   Procedure: SVT ABLATION;  Surgeon: 14/09/2016, MD;  Location: [facility] INVASIVE CV LAB;  Service: Cardiovascular;  Laterality: N/A;  . TONSILLECTOMY  08/2002  . TOTAL KNEE ARTHROPLASTY Right   . TOTAL KNEE ARTHROPLASTY Left 02/21/2014   Procedure: LEFT TOTAL KNEE ARTHROPLASTY;  Surgeon: 14/10/2013, MD;  Location: [facility] OR;  Service: Orthopedics;  Laterality: Left;  . TUBAL LIGATION  1980  . ULTRASOUND GUIDANCE FOR VASCULAR ACCESS Right 07/17/2016   Procedure: ULTRASOUND GUIDANCE FOR VASCULAR ACCESS;  Surgeon: 09/16/2016, MD;  Location: [facility] OR;  Service: Vascular;  Laterality: Right;  . VENOGRAM Right 07/17/2016   Procedure: right ASCENDING VENOGRAM WITH ANGIOJET;  Surgeon: 09/16/2016, MD;  Location: [facility] OR;  Service: Vascular;  Laterality: Right;    Home Medications:  Allergies as of 08/15/2020      Reactions   Nitrofurantoin Itching, Shortness Of Breath   Effexor [venlafaxine] Hives   Myrbetriq [mirabegron] Other (See Comments)   BACK PAIN DRY MOUTH   Nsaids Other (See Comments), Hives   Hypersensitivity vasculitis VASCULITIS   Reglan [metoclopramide] Other (See Comments)   Blister in mouth    Clarithromycin    UNSPECIFIED REACTION    Flagyl [metronidazole]    UNSPECIFIED REACTION    Paxil [paroxetine]    UNSPECIFIED REACTION    Tolterodine Other (  See Comments)   Ampicillin Rash   Cholecalciferol Rash   Codeine Other (See Comments)   Head feels like its crawling   Fluoxetine Hcl Itching   Deep Itch   Sulfa Antibiotics Other (See Comments), Nausea And Vomiting   Stomach irritation Stomach irritation   Tetracycline Nausea And Vomiting   Severe stomach pain      Medication List       Accurate as of August 15, 2020  1:27 PM. If you have any questions, ask your nurse or doctor.        STOP taking  these medications   albuterol 108 (90 Base) MCG/ACT inhaler Commonly known as: VENTOLIN HFA Stopped by: Carman Ching, PA-C   cetirizine 10 MG tablet Commonly known as: ZYRTEC Stopped by: Carman Ching, PA-C   clotrimazole 10 MG troche Commonly known as: MYCELEX Stopped by: Carman Ching, PA-C   hydrocortisone 25 MG suppository Commonly known as: ANUSOL-HC Stopped by: Carman Ching, PA-C   meclizine 25 MG tablet Commonly known as: ANTIVERT Stopped by: Carman Ching, PA-C     TAKE these medications   alendronate 70 MG tablet Commonly known as: FOSAMAX Take 70 mg by mouth once a week. Take with a full glass of water on an empty stomach.   atorvastatin 20 MG tablet Commonly known as: LIPITOR Take 20 mg by mouth daily.   azelastine 0.1 % nasal spray Commonly known as: ASTELIN Place into the nose.   busPIRone 10 MG tablet Commonly known as: BUSPAR Take 10 mg by mouth 2 (two) times daily.   CHOLECALCIFEROL PO Take 500 Units by mouth daily.   fluticasone 50 MCG/ACT nasal spray Commonly known as: FLONASE Place 2 sprays into both nostrils daily.   furosemide 20 MG tablet Commonly known as: LASIX Take 20 mg by mouth.   hydrOXYzine 25 MG tablet Commonly known as: ATARAX/VISTARIL Take by mouth.   montelukast 10 MG tablet Commonly known as: SINGULAIR Take by mouth.   Multi-Vitamin tablet Take by mouth daily.   oxybutynin 15 MG 24 hr tablet Commonly known as: DITROPAN XL Take 1 tablet (15 mg total) by mouth daily.   pseudoephedrine 30 MG tablet Commonly known as: SUDAFED Take 60 mg by mouth 2 (two) times daily as needed for congestion.   sertraline 100 MG tablet Commonly known as: ZOLOFT TAKE 1 TABLET BY MOUTH EVERY NIGHT AT BEDTIME       Allergies:  Allergies  Allergen Reactions  . Nitrofurantoin Itching and Shortness Of Breath  . Effexor [Venlafaxine] Hives  . Myrbetriq [Mirabegron] Other (See Comments)     BACK PAIN DRY MOUTH  . Nsaids Other (See Comments) and Hives    Hypersensitivity vasculitis VASCULITIS  . Reglan [Metoclopramide] Other (See Comments)    Blister in mouth   . Clarithromycin     UNSPECIFIED REACTION   . Flagyl [Metronidazole]     UNSPECIFIED REACTION   . Paxil [Paroxetine]     UNSPECIFIED REACTION   . Tolterodine Other (See Comments)  . Ampicillin Rash  . Cholecalciferol Rash  . Codeine Other (See Comments)    Head feels like its crawling  . Fluoxetine Hcl Itching    Deep Itch   . Sulfa Antibiotics Other (See Comments) and Nausea And Vomiting    Stomach irritation Stomach irritation  . Tetracycline Nausea And Vomiting    Severe stomach pain    Family History: Family History  Problem Relation Age of Onset  . Clotting disorder Mother 67  Blood clot, foot amputation  . Cervical cancer Mother   . Arthritis Sister   . Heart attack Maternal Grandmother 51  . Breast cancer Sister 25  . Asthma Cousin     Social History:   reports that she has never smoked. She has never used smokeless tobacco. She reports that she does not drink alcohol and does not use drugs.  Physical Exam: BP (!) 147/87   Pulse 83   Ht 5\' 3"  (1.6 m)   Wt 222 lb (100.7 kg)   BMI 39.33 kg/m   Constitutional:  Alert and oriented, no acute distress, nontoxic appearing HEENT: [place], AT Cardiovascular: No clubbing, cyanosis, or edema Respiratory: Normal respiratory effort, no increased work of breathing GU: Stage 1 cystocele, Stage 2 rectocele, no leaks on Valsalva Skin: No rashes, bruises or suspicious lesions Neurologic: Grossly intact, no focal deficits, moving all 4 extremities Psychiatric: Normal mood and affect  Laboratory Data: Results for orders placed or performed in visit on 08/15/20  BLADDER SCAN AMB NON-IMAGING  Result Value Ref Range   Scan Result 6 ml    Assessment & Plan:   1. OAB (overactive bladder) Sx improved, PVR WNL. OK to continue oxybutynin XL 15mg .  Patient reports continued bother, possible associated with #2 above. May consider third line therapies in the future. - BLADDER SCAN AMB NON-IMAGING  2. Female genital prolapse, unspecified type Anterior and posterior vaginal bulging today on physical exam consistent with both cystocele and rectocele. Difficulty passing stool consistent with rectocele. Recommend further evaluation with Dr. 10/15/20 to discuss treatment options. Patient is in agreement with this plan.   Return in about 4 weeks (around 09/12/2020) for POP consultation with Dr. Sherron Monday.  09/14/2020, PA-C  [facility] [address] [place], [address] [place] [phone]

## 2020-10-01 ENCOUNTER — Ambulatory Visit: Payer: Medicare HMO | Admitting: Urology

## 2020-10-01 ENCOUNTER — Other Ambulatory Visit: Payer: Self-pay

## 2020-10-01 VITALS — BP 140/80 | HR 80

## 2020-10-01 DIAGNOSIS — N3946 Mixed incontinence: Secondary | ICD-10-CM

## 2020-10-01 NOTE — Progress Notes (Signed)
10/01/2020 2:10 PM   Cassandra [name] March 03, 1956 [id number]  Referring provider: Carren Rang, PA-C [address] [place],  [place] [address]  No chief complaint on file.   HPI: Patient was seen by my partners for mixed incontinence treated with Ditropan XL 15 mg.  Was noted to have prolapse on pelvic examination  Patient reports urge incontinence.  She can leak with coughing sneezing but not bending lifting.  She thinks her stress component is more significant.  She has no bedwetting.  She wears 2-3 pads a day sometimes they are soaked and other times damp  She voids every 90 minutes to 2 hours and gets up 3 times a night.  I think sometimes she takes a diuretic for ankle edema  She has not had a hysterectomy.  She feels like something is in the vagina but was a bit nonspecific that something was hanging out.  She was a partial responder to [employer].  She is currently a partial responder to oxybutynin.  She is prone to constipation diarrhea  She denies a history of bladder surgery kidney stones or bladder infections  PMH: Past Medical History:  Diagnosis Date   Allergy    Anxiety    Complication of anesthesia    difficulty waking up   Depression    Dysrhythmia    svt   GERD (gastroesophageal reflux disease)    Heart murmur    never seen cardiologist for this   Hypertension    Leukocytoclastic vasculitis (HCC)    Dr. Marchelle Gearing and Dr. 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   Verrucae vulgaris    Wegner's disease (congenital syphilitic osteochondritis)     Surgical History: Past Surgical History:  Procedure Laterality Date   BUNIONECTOMY Bilateral    CARPAL TUNNEL RELEASE Right 2002   JOINT REPLACEMENT Right    knee    LOWER EXTREMITY VENOGRAPHY Right 07/14/2016   Procedure: Lower Extremity Venography;  Surgeon: Maeola Harman, MD;  Location: [facility] INVASIVE CV LAB;  Service: Cardiovascular;  Laterality: Right;   PERCUTANEOUS VENOUS THROMBECTOMY,LYSIS WITH INTRAVASCULAR ULTRASOUND (IVUS) Right 07/17/2016   Procedure: PERCUTANEOUS VENOUS THROMBECTOMY AND LYSIS WITH INTRAVASCULAR ULTRASOUND (IVUS) of right lower extermity with balloon angioplasty;  Surgeon: Maeola Harman, MD;  Location: [facility] OR;  Service: Vascular;  Laterality: Right;   SVT ABLATION N/A 02/20/2017   Procedure: SVT ABLATION;  Surgeon: Marinus Maw, MD;  Location: [facility] INVASIVE CV LAB;  Service: Cardiovascular;  Laterality: N/A;   TONSILLECTOMY  08/2002   TOTAL KNEE ARTHROPLASTY Right    TOTAL KNEE ARTHROPLASTY Left 02/21/2014   Procedure: LEFT TOTAL KNEE ARTHROPLASTY;  Surgeon: Velna Ochs, MD;  Location: [facility] OR;  Service: Orthopedics;  Laterality: Left;   TUBAL LIGATION  1980   ULTRASOUND GUIDANCE FOR VASCULAR ACCESS Right 07/17/2016   Procedure: ULTRASOUND GUIDANCE FOR VASCULAR ACCESS;  Surgeon: Maeola Harman, MD;  Location: [facility] OR;  Service: Vascular;  Laterality: Right;   VENOGRAM Right 07/17/2016   Procedure: right ASCENDING VENOGRAM WITH ANGIOJET;  Surgeon: Maeola Harman, MD;  Location: [facility] OR;  Service: Vascular;  Laterality: Right;    Home Medications:  Allergies as of 10/01/2020  Reactions   Nitrofurantoin Itching, Shortness Of Breath   Effexor [venlafaxine] Hives   Myrbetriq [mirabegron] Other (See Comments)   BACK PAIN DRY MOUTH   Nsaids Other (See Comments), Hives   Hypersensitivity vasculitis VASCULITIS   Reglan [metoclopramide] Other (See Comments)   Blister in mouth    Clarithromycin    UNSPECIFIED REACTION    Flagyl [metronidazole]    UNSPECIFIED REACTION    Paxil [paroxetine]    UNSPECIFIED REACTION    Tolterodine Other (See Comments)   Ampicillin Rash   Cholecalciferol Rash    Codeine Other (See Comments)   Head feels like its crawling   Fluoxetine Hcl Itching   Deep Itch   Sulfa Antibiotics Other (See Comments), Nausea And Vomiting   Stomach irritation Stomach irritation   Tetracycline Nausea And Vomiting   Severe stomach pain        Medication List        Accurate as of October 01, 2020  2:10 PM. If you have any questions, ask your nurse or doctor.          alendronate 70 MG tablet Commonly known as: FOSAMAX Take 70 mg by mouth once a week. Take with a full glass of water on an empty stomach.   atorvastatin 20 MG tablet Commonly known as: LIPITOR Take 20 mg by mouth daily.   azelastine 0.1 % nasal spray Commonly known as: ASTELIN Place into the nose.   busPIRone 10 MG tablet Commonly known as: BUSPAR Take 10 mg by mouth 2 (two) times daily.   CHOLECALCIFEROL PO Take 500 Units by mouth daily.   fluticasone 50 MCG/ACT nasal spray Commonly known as: FLONASE Place 2 sprays into both nostrils daily.   furosemide 20 MG tablet Commonly known as: LASIX Take 20 mg by mouth.   hydrOXYzine 25 MG tablet Commonly known as: ATARAX/VISTARIL Take by mouth.   montelukast 10 MG tablet Commonly known as: SINGULAIR Take by mouth.   Multi-Vitamin tablet Take by mouth daily.   oxybutynin 15 MG 24 hr tablet Commonly known as: DITROPAN XL Take 1 tablet (15 mg total) by mouth daily.   pseudoephedrine 30 MG tablet Commonly known as: SUDAFED Take 60 mg by mouth 2 (two) times daily as needed for congestion.   sertraline 100 MG tablet Commonly known as: ZOLOFT TAKE 1 TABLET BY MOUTH EVERY NIGHT AT BEDTIME        Allergies:  Allergies  Allergen Reactions   Nitrofurantoin Itching and Shortness Of Breath   Effexor [Venlafaxine] Hives   Myrbetriq [Mirabegron] Other (See Comments)    BACK PAIN DRY MOUTH   Nsaids Other (See Comments) and Hives    Hypersensitivity vasculitis VASCULITIS   Reglan [Metoclopramide] Other (See Comments)     Blister in mouth    Clarithromycin     UNSPECIFIED REACTION    Flagyl [Metronidazole]     UNSPECIFIED REACTION    Paxil [Paroxetine]     UNSPECIFIED REACTION    Tolterodine Other (See Comments)   Ampicillin Rash   Cholecalciferol Rash   Codeine Other (See Comments)    Head feels like its crawling   Fluoxetine Hcl Itching    Deep Itch    Sulfa Antibiotics Other (See Comments) and Nausea And Vomiting    Stomach irritation Stomach irritation   Tetracycline Nausea And Vomiting    Severe stomach pain    Family History: Family History  Problem Relation Age of Onset   Clotting disorder Mother 59  Blood clot, foot amputation   Cervical cancer Mother    Arthritis Sister    Heart attack Maternal Grandmother 38   Breast cancer Sister 57   Asthma Cousin     Social History:  reports that she has never smoked. She has never used smokeless tobacco. She reports that she does not drink alcohol and does not use drugs.  ROS:                                        Physical Exam: There were no vitals taken for this visit.  Constitutional:  Alert and oriented, No acute distress. HEENT: [place] AT, moist mucus membranes.  Trachea midline, no masses. Cardiovascular: No clubbing, cyanosis, or edema. Respiratory: Normal respiratory effort, no increased work of breathing. GI: Abdomen is soft, nontender, nondistended, no abdominal masses GU: On pelvic examination patient had good vaginal length.  She had little to no cystocele or grade 1.  She had mild bulging of the posterior vaginal wall near the introitus but I could feel a moderate amount of stool in the rectum.  She really did not have much of a defect.  She did not have perineal bulging.  There would be really minimal defect to repair under anesthesia with almost no central defect whatsoever.  Uterus is well supported Skin: No rashes, bruises or suspicious lesions. Lymph: No cervical or inguinal  adenopathy. Neurologic: Grossly intact, no focal deficits, moving all 4 extremities. Psychiatric: Normal mood and affect.  Laboratory Data: Lab Results  Component Value Date   WBC 4.5 02/11/2017   HGB 11.7 02/11/2017   HCT 36.0 02/11/2017   MCV 92 02/11/2017   PLT 163 02/11/2017    Lab Results  Component Value Date   CREATININE 1.19 (H) 02/11/2017    No results found for: PSA  No results found for: TESTOSTERONE  No results found for: HGBA1C  Urinalysis    Component Value Date/Time   COLORURINE COLORLESS (A) 07/20/2016 1617   APPEARANCEUR Clear 07/04/2020 1508   LABSPEC 1.002 (L) 07/20/2016 1617   PHURINE 7.0 07/20/2016 1617   GLUCOSEU Negative 07/04/2020 1508   GLUCOSEU NEGATIVE 11/30/2008 1522   HGBUR SMALL (A) 07/20/2016 1617   BILIRUBINUR Negative 07/04/2020 1508   KETONESUR NEGATIVE 07/20/2016 1617   PROTEINUR Negative 07/04/2020 1508   PROTEINUR NEGATIVE 07/20/2016 1617   UROBILINOGEN 1 06/02/2013 1524   NITRITE Negative 07/04/2020 1508   NITRITE NEGATIVE 07/20/2016 1617   LEUKOCYTESUR Negative 07/04/2020 1508    Pertinent Imaging: Chart reviewed.  Assessment & Plan: Patient has mixed incontinence.  She has frequency and nocturia.  She has prolapse symptoms.  She has not had a hysterectomy.  The patient's symptoms were nonspecific and I think related to her constipation which is obvious associated with a mild posterior distal defect.  Certainly surgery would not fix this.  Picture was drawn.  I like to see her back in about 6 weeks for pelvic examination and cystoscopy on new beta 3 agonist samples.  I think it be reasonable at least to stop the oxybutynin because of the constipation.  Depending on her goals and response I will then order urodynamics.  Pathophysiology and role of urodynamics discussed today.  She did think oxybutynin worsens her constipation.  Picture was drawn regarding the prolapse.    There are no diagnoses linked to this encounter.  No  follow-ups on file.  Enma Maeda  Reola Mosher, MD  [facility] [address] [place], [place] [address] [phone]

## 2020-10-01 NOTE — Patient Instructions (Signed)
Cystoscopy Cystoscopy is a procedure that is used to help diagnose and sometimes treat conditions that affect the lower urinary tract. The lower urinary tract includes the bladder and the urethra. The urethra is the tube that drains urine from the bladder. Cystoscopy is done using a thin, tube-shaped instrument with a light and camera at the end (cystoscope). The cystoscope may be hard or flexible, depending on the goal of the procedure. The cystoscope is inserted through the urethra, into the bladder. Cystoscopy may be recommended if you have: Urinary tract infections that keep coming back. Blood in the urine (hematuria). An inability to control when you urinate (urinary incontinence) or an overactive bladder. Unusual cells found in a urine sample. A blockage in the urethra, such as a urinary stone. Painful urination. An abnormality in the bladder found during an intravenous pyelogram (IVP) or CT scan. Cystoscopy may also be done to remove a sample of tissue to be examined under a microscope (biopsy). What are the risks? Generally, this is a safe procedure. However, problems may occur, including: Infection. Bleeding.  What happens during the procedure?  You will be given one or more of the following: A medicine to numb the area (local anesthetic). The area around the opening of your urethra will be cleaned. The cystoscope will be passed through your urethra into your bladder. Germ-free (sterile) fluid will flow through the cystoscope to fill your bladder. The fluid will stretch your bladder so that your health care provider can clearly examine your bladder walls. Your doctor will look at the urethra and bladder. The cystoscope will be removed The procedure may vary among health care providers  What can I expect after the procedure? After the procedure, it is common to have: Some soreness or pain in your abdomen and urethra. Urinary symptoms. These include: Mild pain or burning when you  urinate. Pain should stop within a few minutes after you urinate. This may last for up to 1 week. A small amount of blood in your urine for several days. Feeling like you need to urinate but producing only a small amount of urine. Follow these instructions at home: General instructions Return to your normal activities as told by your health care provider.  Do not drive for 24 hours if you were given a sedative during your procedure. Watch for any blood in your urine. If the amount of blood in your urine increases, call your health care provider. If a tissue sample was removed for testing (biopsy) during your procedure, it is up to you to get your test results. Ask your health care provider, or the department that is doing the test, when your results will be ready. Drink enough fluid to keep your urine pale yellow. Keep all follow-up visits as told by your health care provider. This is important. Contact a health care provider if you: Have pain that gets worse or does not get better with medicine, especially pain when you urinate. Have trouble urinating. Have more blood in your urine. Get help right away if you: Have blood clots in your urine. Have abdominal pain. Have a fever or chills. Are unable to urinate. Summary Cystoscopy is a procedure that is used to help diagnose and sometimes treat conditions that affect the lower urinary tract. Cystoscopy is done using a thin, tube-shaped instrument with a light and camera at the end. After the procedure, it is common to have some soreness or pain in your abdomen and urethra. Watch for any blood in your urine.   If the amount of blood in your urine increases, call your health care provider. If you were prescribed an antibiotic medicine, take it as told by your health care provider. Do not stop taking the antibiotic even if you start to feel better. This information is not intended to replace advice given to you by your health care provider. Make  sure you discuss any questions you have with your health care provider. Document Revised: 02/23/2018 Document Reviewed: 02/23/2018 Elsevier Patient Education  2020 [employer]  

## 2020-11-05 ENCOUNTER — Ambulatory Visit: Payer: Medicare HMO | Admitting: Urology

## 2020-11-05 ENCOUNTER — Other Ambulatory Visit: Payer: Self-pay

## 2020-11-05 VITALS — BP 140/70 | HR 70

## 2020-11-05 DIAGNOSIS — N3946 Mixed incontinence: Secondary | ICD-10-CM

## 2020-11-05 LAB — URINALYSIS, COMPLETE
Bilirubin, UA: NEGATIVE
Glucose, UA: NEGATIVE
Ketones, UA: NEGATIVE
Leukocytes,UA: NEGATIVE
Nitrite, UA: NEGATIVE
RBC, UA: NEGATIVE
Specific Gravity, UA: 1.02 (ref 1.005–1.030)
Urobilinogen, Ur: 0.2 mg/dL (ref 0.2–1.0)
pH, UA: 5.5 (ref 5.0–7.5)

## 2020-11-05 LAB — MICROSCOPIC EXAMINATION: Bacteria, UA: NONE SEEN

## 2020-11-05 NOTE — Progress Notes (Signed)
11/05/2020 2:45 PM   Cassandra [name] [birth year] [id number]  Referring provider: Carren Rang, PA-C [address] [place],  [place] [address]  Chief Complaint  Patient presents with   Cysto    HPI: Patient was seen by my partners for mixed incontinence treated with Ditropan XL 15 mg.  Was noted to have prolapse on pelvic examination   Patient reports urge incontinence.  She can leak with coughing sneezing but not bending lifting.  She thinks her stress component is more significant.  She has no bedwetting.  She wears 2-3 pads a day sometimes they are soaked and other times damp  She voids every 90 minutes to 2 hours and gets up 3 times a night.  I think sometimes she takes a diuretic for ankle edema  She has not had a hysterectomy.  She feels like something is in the vagina but was a bit nonspecific that something was hanging out.  She was a partial responder to [employer].  She is currently a partial responder to oxybutynin.  She is prone to constipation diarrhea    On pelvic examination patient had good vaginal length.  She had little to no cystocele or grade 1.  She had mild bulging of the posterior vaginal wall near the introitus but I could feel a moderate amount of stool in the rectum.  She really did not have much of a defect.  She did not have perineal bulging.  There would be really minimal defect to repair under anesthesia with almost no central defect whatsoever.  Uterus is well supported  Patient has mixed incontinence.  She has frequency and nocturia.  She has prolapse symptoms.  She has not had a hysterectomy.  The patient's symptoms were nonspecific and I think related to her constipation which is obvious associated with a mild posterior distal defect.  Certainly surgery would not fix this.  Picture was drawn.  I like to see her back in about 6 weeks for pelvic examination and cystoscopy on new beta 3 agonist samples.  I think it be reasonable at least to  stop the oxybutynin because of the constipation.  Depending on her goals and response I will then order urodynamics.  Pathophysiology and role of urodynamics discussed today.  She did think oxybutynin worsens her constipation.  Picture was drawn regarding the prolapse.    Today Since stopping the oxybutynin bowel symptoms and rectal symptoms have greatly improved.  Today on pelvic examination she had a high small grade 2 cystocele that did not reach the urethrovesical angle.  She had a distal rectocele that was soft with no obvious constipation.  No stress incontinence  Cystoscopy: Patient underwent flexible cystoscopy.  Bladder mucosa and trigone were normal.  No stitch foreign body or carcinoma  She failed the beta 3 agonist.  It may have helped some.  Main symptom is still urgency and frequency.   PMH: Past Medical History:  Diagnosis Date   Allergy    Anxiety    Complication of anesthesia    difficulty waking up   Depression    Dysrhythmia    svt   GERD (gastroesophageal reflux disease)    Heart murmur    never seen cardiologist for this   Hypertension    Leukocytoclastic vasculitis (HCC)    Dr. Marchelle Gearing and Dr. 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   Verrucae vulgaris    Wegner's disease (congenital syphilitic osteochondritis)     Surgical History: Past Surgical History:  Procedure Laterality Date   BUNIONECTOMY Bilateral    CARPAL TUNNEL RELEASE Right 2002   JOINT REPLACEMENT Right    knee   LOWER EXTREMITY VENOGRAPHY Right 07/14/2016   Procedure: Lower Extremity Venography;  Surgeon: Maeola HarmanBrandon Christopher Cain, MD;  [facility];  Service: Cardiovascular;  Laterality: Right;   PERCUTANEOUS VENOUS THROMBECTOMY,LYSIS WITH  INTRAVASCULAR ULTRASOUND (IVUS) Right 07/17/2016   Procedure: PERCUTANEOUS VENOUS THROMBECTOMY AND LYSIS WITH INTRAVASCULAR ULTRASOUND (IVUS) of right lower extermity with balloon angioplasty;  Surgeon: Maeola HarmanBrandon Christopher Cain, MD;  [facility];  Service: Vascular;  Laterality: Right;   SVT ABLATION N/A 02/20/2017   Procedure: SVT ABLATION;  Surgeon: Marinus Mawaylor, Gregg W, MD;  Location: [facility] INVASIVE CV LAB;  Service: Cardiovascular;  Laterality: N/A;   TONSILLECTOMY  08/2002   TOTAL KNEE ARTHROPLASTY Right    TOTAL KNEE ARTHROPLASTY Left 02/21/2014   Procedure: LEFT TOTAL KNEE ARTHROPLASTY;  Surgeon: Velna OchsPeter G Dalldorf, MD;  Location: [facility] OR;  Service: Orthopedics;  Laterality: Left;   TUBAL LIGATION  1980   ULTRASOUND GUIDANCE FOR VASCULAR ACCESS Right 07/17/2016   Procedure: ULTRASOUND GUIDANCE FOR VASCULAR ACCESS;  Surgeon: Maeola HarmanBrandon Christopher Cain, MD;  [facility];  Service: Vascular;  Laterality: Right;   VENOGRAM Right 07/17/2016   Procedure: right ASCENDING VENOGRAM WITH ANGIOJET;  Surgeon: Maeola HarmanBrandon Christopher Cain, MD;  [facility];  Service: Vascular;  Laterality: Right;    Home Medications:  Allergies as of 11/05/2020       Reactions   Nitrofurantoin Itching, Shortness Of Breath   Effexor [venlafaxine] Hives   Myrbetriq [mirabegron] Other (See Comments)   BACK PAIN DRY MOUTH   Nsaids Other (See Comments), Hives   Hypersensitivity vasculitis VASCULITIS   Reglan [metoclopramide] Other (See Comments)   Blister in mouth    Clarithromycin    UNSPECIFIED REACTION    Flagyl [metronidazole]    UNSPECIFIED REACTION    Paxil [paroxetine]    UNSPECIFIED REACTION    Tolterodine Other (See Comments)   Ampicillin Rash   Cholecalciferol Rash   Codeine Other (See Comments)   Head feels like its crawling   Fluoxetine Hcl Itching   Deep Itch   Sulfa Antibiotics Other (See Comments), Nausea And Vomiting   Stomach irritation Stomach irritation   Tetracycline Nausea And Vomiting    Severe stomach pain        Medication List        Accurate as of November 05, 2020  2:45 PM. If you have any questions, ask your nurse or doctor.          STOP taking these medications    pseudoephedrine 30 MG tablet Commonly known as: SUDAFED Stopped by: Martina SinnerScott A Yuriel Lopezmartinez, MD       TAKE these medications    alendronate 70 MG tablet Commonly known as: FOSAMAX Take 70 mg by mouth once a week. Take with a full glass of water on an empty stomach.   atorvastatin 20 MG tablet Commonly known as: LIPITOR Take 20 mg by mouth daily.   azelastine 0.1 % nasal spray Commonly known as: ASTELIN Place into the nose.   busPIRone 10 MG tablet Commonly known as: BUSPAR Take 10 mg by mouth 2 (two)  times daily.   CHOLECALCIFEROL PO Take 500 Units by mouth daily.   Dexlansoprazole 30 MG capsule Take 1 capsule by mouth daily.   furosemide 20 MG tablet Commonly known as: LASIX Take 20 mg by mouth.   hydrOXYzine 25 MG tablet Commonly known as: ATARAX/VISTARIL Take by mouth.   montelukast 10 MG tablet Commonly known as: SINGULAIR Take by mouth.   Multi-Vitamin tablet Take by mouth daily.   oxybutynin 15 MG 24 hr tablet Commonly known as: DITROPAN XL Take 1 tablet (15 mg total) by mouth daily.   sertraline 100 MG tablet Commonly known as: ZOLOFT TAKE 1 TABLET BY MOUTH EVERY NIGHT AT BEDTIME        Allergies:  Allergies  Allergen Reactions   Nitrofurantoin Itching and Shortness Of Breath   Effexor [Venlafaxine] Hives   Myrbetriq [Mirabegron] Other (See Comments)    BACK PAIN DRY MOUTH   Nsaids Other (See Comments) and Hives    Hypersensitivity vasculitis VASCULITIS   Reglan [Metoclopramide] Other (See Comments)    Blister in mouth    Clarithromycin     UNSPECIFIED REACTION    Flagyl [Metronidazole]     UNSPECIFIED REACTION    Paxil [Paroxetine]     UNSPECIFIED REACTION    Tolterodine Other (See Comments)   Ampicillin Rash   Cholecalciferol Rash    Codeine Other (See Comments)    Head feels like its crawling   Fluoxetine Hcl Itching    Deep Itch    Sulfa Antibiotics Other (See Comments) and Nausea And Vomiting    Stomach irritation Stomach irritation   Tetracycline Nausea And Vomiting    Severe stomach pain    Family History: Family History  Problem Relation Age of Onset   Clotting disorder Mother 40       Blood clot, foot amputation   Cervical cancer Mother    Arthritis Sister    Heart attack Maternal Grandmother 23   Breast cancer Sister 30   Asthma Cousin     Social History:  reports that she has never smoked. She has never used smokeless tobacco. She reports that she does not drink alcohol and does not use drugs.  ROS:                                        Physical Exam: There were no vitals taken for this visit.  Constitutional:  Alert and oriented, No acute distress. HEENT: [place] AT, moist mucus membranes.  Trachea midline, no masses.   Laboratory Data: Lab Results  Component Value Date   WBC 4.5 02/11/2017   HGB 11.7 02/11/2017   HCT 36.0 02/11/2017   MCV 92 02/11/2017   PLT 163 02/11/2017    Lab Results  Component Value Date   CREATININE 1.19 (H) 02/11/2017    No results found for: PSA  No results found for: TESTOSTERONE  No results found for: HGBA1C  Urinalysis    Component Value Date/Time   COLORURINE COLORLESS (A) 07/20/2016 1617   APPEARANCEUR Clear 07/04/2020 1508   LABSPEC 1.002 (L) 07/20/2016 1617   PHURINE 7.0 07/20/2016 1617   GLUCOSEU Negative 07/04/2020 1508   GLUCOSEU NEGATIVE 11/30/2008 1522   HGBUR SMALL (A) 07/20/2016 1617   BILIRUBINUR Negative 07/04/2020 1508   KETONESUR NEGATIVE 07/20/2016 1617   PROTEINUR Negative 07/04/2020 1508   PROTEINUR NEGATIVE 07/20/2016 1617   UROBILINOGEN 1 06/02/2013 1524   NITRITE  Negative 07/04/2020 1508   NITRITE NEGATIVE 07/20/2016 1617   LEUKOCYTESUR Negative 07/04/2020 1508    Pertinent  Imaging:   Assessment & Plan: Patient has mixed incontinence.  She has failed medication.  She appears to have an altered affect and I am not certain that surgery is in her best interest.  Urodynamics ordered.  1. Mixed incontinence  - Urinalysis, Complete   No follow-ups on file.  Martina Sinner, MD  [facility] [address] [place], [place] [address] [phone]

## 2020-12-17 ENCOUNTER — Encounter: Payer: Self-pay | Admitting: Urology

## 2020-12-17 ENCOUNTER — Other Ambulatory Visit: Payer: Self-pay

## 2020-12-17 ENCOUNTER — Telehealth: Payer: Self-pay | Admitting: *Deleted

## 2020-12-17 ENCOUNTER — Ambulatory Visit: Payer: Medicare HMO | Admitting: Urology

## 2020-12-17 ENCOUNTER — Other Ambulatory Visit: Payer: Self-pay | Admitting: Urology

## 2020-12-17 VITALS — BP 142/78 | HR 82 | Ht 63.0 in | Wt 222.0 lb

## 2020-12-17 DIAGNOSIS — N3946 Mixed incontinence: Secondary | ICD-10-CM | POA: Diagnosis not present

## 2020-12-17 NOTE — Progress Notes (Signed)
12/17/2020 3:08 PM   Cassandra [name] 05-24-1955 [id number]  Referring provider: Carren Rang, PA-C [address] [place],  [place] [address]  Chief Complaint  Patient presents with   Urinary Incontinence    HPI: Patient was seen by my partners for mixed incontinence treated with Ditropan XL 15 mg.  Was noted to have prolapse on pelvic examination   Patient reports urge incontinence.  She can leak with coughing sneezing but not bending lifting.  She thinks her stress component is more significant.  She has no bedwetting.  She wears 2-3 pads a day sometimes they are soaked and other times damp  She voids every 90 minutes to 2 hours and gets up 3 times a night.  I think sometimes she takes a diuretic for ankle edema  She has not had a hysterectomy.  She feels like something is in the vagina but was a bit nonspecific that something was hanging out.  She was a partial responder to [employer].  She is currently a partial responder to oxybutynin.  She is prone to constipation diarrhea     On pelvic examination patient had good vaginal length.  She had little to no cystocele or grade 1.  She had mild bulging of the posterior vaginal wall near the introitus but I could feel a moderate amount of stool in the rectum.  She really did not have much of a defect.  She did not have perineal bulging.  There would be really minimal defect to repair under anesthesia with almost no central defect whatsoever.  Uterus is well supported   Patient has mixed incontinence.  She has frequency and nocturia.  She has prolapse symptoms.  She has not had a hysterectomy.  The patient's symptoms were nonspecific and I think related to her constipation which is obvious associated with a mild posterior distal defect.  Certainly surgery would not fix this.  Picture was drawn.  I like to see her back in about 6 weeks for pelvic examination and cystoscopy on new beta 3 agonist samples.  I think it be  reasonable at least to stop the oxybutynin because of the constipation.  Depending on her goals and response I will then order urodynamics.  Pathophysiology and role of urodynamics discussed today.  She did think oxybutynin worsens her constipation.  Picture was drawn regarding the prolapse.     Since stopping the oxybutynin bowel symptoms and rectal symptoms have greatly improved.  Today on pelvic examination she had a high small grade 2 cystocele that did not reach the urethrovesical angle.  She had a distal rectocele that was soft with no obvious constipation.  No stress incontinence  Cystoscopy: normal  She failed the beta 3 agonist.  It may have helped some.  Main symptom is still urgency and frequency.  Patient has mixed incontinence.  She has failed medication.  She appears to have an altered affect and I am not certain that surgery is in her best interest.    Today Frequency stable.  Incontinence stable. Patient did not void prior to the study and was catheterized for a few milliliters.  Maximum bladder capacity was 145 mL with increased bladder sensation.  She had multiple unstable contractions that were very uncomfortable to the point that she was nauseous.  One of the contraction pressures reach 125 cm of water and she did not leak.  Others reached a pressure of 78 cm of water.  The overactivity was very impressive.  She  had no stress incontinence with Valsalva pressure of 100 cm of water.  During voluntary voiding she voided 63 mL with a max of flow 70 mils per second.  Maximum voiding pressure 35 cm of water.  Residual was 84 mL.  EMG was quiet during voiding.  Bladder neck descended 1 cm.  The details of the urodynamics are signed and dictated  Patient has a refractory overactive bladder.       PMH: Past Medical History:  Diagnosis Date   Allergy    Anxiety    Complication of anesthesia    difficulty waking up   Depression    Dysrhythmia    svt   GERD (gastroesophageal  reflux disease)    Heart murmur    never seen cardiologist for this   Hypertension    Leukocytoclastic vasculitis (HCC)    Dr. Marchelle Gearing and Dr. 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   Verrucae vulgaris    Wegner's disease (congenital syphilitic osteochondritis)     Surgical History: Past Surgical History:  Procedure Laterality Date   BUNIONECTOMY Bilateral    CARPAL TUNNEL RELEASE Right 2002   JOINT REPLACEMENT Right    knee   LOWER EXTREMITY VENOGRAPHY Right 07/14/2016   Procedure: Lower Extremity Venography;  Surgeon: Maeola Harman, MD;  Location: [facility] INVASIVE CV LAB;  Service: Cardiovascular;  Laterality: Right;   PERCUTANEOUS VENOUS THROMBECTOMY,LYSIS WITH INTRAVASCULAR ULTRASOUND (IVUS) Right 07/17/2016   Procedure: PERCUTANEOUS VENOUS THROMBECTOMY AND LYSIS WITH INTRAVASCULAR ULTRASOUND (IVUS) of right lower extermity with balloon angioplasty;  Surgeon: Maeola Harman, MD;  Location: [facility] OR;  Service: Vascular;  Laterality: Right;   SVT ABLATION N/A 02/20/2017   Procedure: SVT ABLATION;  Surgeon: Marinus Maw, MD;  Location: [facility] INVASIVE CV LAB;  Service: Cardiovascular;  Laterality: N/A;   TONSILLECTOMY  08/2002   TOTAL KNEE ARTHROPLASTY Right    TOTAL KNEE ARTHROPLASTY Left 02/21/2014   Procedure: LEFT TOTAL KNEE ARTHROPLASTY;  Surgeon: Velna Ochs, MD;  Location: [facility] OR;  Service: Orthopedics;  Laterality: Left;   TUBAL LIGATION  1980   ULTRASOUND GUIDANCE FOR VASCULAR ACCESS Right 07/17/2016   Procedure: ULTRASOUND GUIDANCE FOR VASCULAR ACCESS;  Surgeon: Maeola Harman, MD;  Location: [facility] OR;  Service: Vascular;  Laterality: Right;   VENOGRAM Right 07/17/2016   Procedure: right ASCENDING VENOGRAM WITH ANGIOJET;  Surgeon:  Maeola Harman, MD;  Location: [facility] OR;  Service: Vascular;  Laterality: Right;    Home Medications:  Allergies as of 12/17/2020       Reactions   Nitrofurantoin Itching, Shortness Of Breath   Effexor [venlafaxine] Hives   Myrbetriq [mirabegron] Other (See Comments)   BACK PAIN DRY MOUTH   Nsaids Other (See Comments), Hives   Hypersensitivity vasculitis VASCULITIS   Reglan [metoclopramide] Other (See Comments)   Blister in mouth    Clarithromycin    UNSPECIFIED REACTION    Flagyl [metronidazole]    UNSPECIFIED REACTION    Paxil [paroxetine]    UNSPECIFIED REACTION    Tolterodine Other (See Comments)   Ampicillin Rash   Cholecalciferol Rash   Codeine Other (See Comments)   Head feels like its crawling   Fluoxetine Hcl Itching   Deep Itch  Sulfa Antibiotics Other (See Comments), Nausea And Vomiting   Stomach irritation Stomach irritation   Tetracycline Nausea And Vomiting   Severe stomach pain        Medication List        Accurate as of December 17, 2020  3:08 PM. If you have any questions, ask your nurse or doctor.          alendronate 70 MG tablet Commonly known as: FOSAMAX Take 70 mg by mouth once a week. Take with a full glass of water on an empty stomach.   atorvastatin 20 MG tablet Commonly known as: LIPITOR Take 20 mg by mouth daily.   azelastine 0.1 % nasal spray Commonly known as: ASTELIN Place into the nose.   busPIRone 10 MG tablet Commonly known as: BUSPAR Take 10 mg by mouth 2 (two) times daily.   CHOLECALCIFEROL PO Take 500 Units by mouth daily.   Dexlansoprazole 30 MG capsule Take 1 capsule by mouth daily.   furosemide 20 MG tablet Commonly known as: LASIX Take 20 mg by mouth.   hydrOXYzine 25 MG tablet Commonly known as: ATARAX/VISTARIL Take by mouth.   montelukast 10 MG tablet Commonly known as: SINGULAIR Take by mouth.   Multi-Vitamin tablet Take by mouth daily.   sertraline 100 MG tablet Commonly known as:  ZOLOFT TAKE 1 TABLET BY MOUTH EVERY NIGHT AT BEDTIME        Allergies:  Allergies  Allergen Reactions   Nitrofurantoin Itching and Shortness Of Breath   Effexor [Venlafaxine] Hives   Myrbetriq [Mirabegron] Other (See Comments)    BACK PAIN DRY MOUTH   Nsaids Other (See Comments) and Hives    Hypersensitivity vasculitis VASCULITIS   Reglan [Metoclopramide] Other (See Comments)    Blister in mouth    Clarithromycin     UNSPECIFIED REACTION    Flagyl [Metronidazole]     UNSPECIFIED REACTION    Paxil [Paroxetine]     UNSPECIFIED REACTION    Tolterodine Other (See Comments)   Ampicillin Rash   Cholecalciferol Rash   Codeine Other (See Comments)    Head feels like its crawling   Fluoxetine Hcl Itching    Deep Itch    Sulfa Antibiotics Other (See Comments) and Nausea And Vomiting    Stomach irritation Stomach irritation   Tetracycline Nausea And Vomiting    Severe stomach pain    Family History: Family History  Problem Relation Age of Onset   Clotting disorder Mother 49       Blood clot, foot amputation   Cervical cancer Mother    Arthritis Sister    Heart attack Maternal Grandmother 35   Breast cancer Sister 30   Asthma Cousin     Social History:  reports that she has never smoked. She has never used smokeless tobacco. She reports that she does not drink alcohol and does not use drugs.  ROS:                                        Physical Exam: BP (!) 142/78   Pulse 82   Ht 5\' 3"  (1.6 m)   Wt 100.7 kg   BMI 39.33 kg/m   Constitutional:  Alert and oriented, No acute distress. HEENT: [place] AT, moist mucus membranes.  Trachea midline, no masses.   Laboratory Data: Lab Results  Component Value Date   WBC 4.5 02/11/2017  HGB 11.7 02/11/2017   HCT 36.0 02/11/2017   MCV 92 02/11/2017   PLT 163 02/11/2017    Lab Results  Component Value Date   CREATININE 1.19 (H) 02/11/2017    No results found for: PSA  No results found  for: TESTOSTERONE  No results found for: HGBA1C  Urinalysis    Component Value Date/Time   COLORURINE COLORLESS (A) 07/20/2016 1617   APPEARANCEUR Clear 11/05/2020 1452   LABSPEC 1.002 (L) 07/20/2016 1617   PHURINE 7.0 07/20/2016 1617   GLUCOSEU Negative 11/05/2020 1452   GLUCOSEU NEGATIVE 11/30/2008 1522   HGBUR SMALL (A) 07/20/2016 1617   BILIRUBINUR Negative 11/05/2020 1452   KETONESUR NEGATIVE 07/20/2016 1617   PROTEINUR Trace (A) 11/05/2020 1452   PROTEINUR NEGATIVE 07/20/2016 1617   UROBILINOGEN 1 06/02/2013 1524   NITRITE Negative 11/05/2020 1452   NITRITE NEGATIVE 07/20/2016 1617   LEUKOCYTESUR Negative 11/05/2020 1452    Pertinent Imaging:   Assessment & Plan: Patient has refractory overactive bladder.  Her bladder overactivity and bladder pressures were very impressive.  I do not see any obvious risk factors for this.  I went over 3 refractory treatments as full template and she wants to do percutaneous tibial nerve stimulation but think about the others.  She got very nauseated and was very uncomfortable with the urodynamics as noted.  I think if she ever had the peripheral nerve evaluation to be best done in the operating room  There are no diagnoses linked to this encounter.  No follow-ups on file.  Martina Sinner, MD  [facility] [address], [address] [place], [place] [address] [phone]

## 2020-12-17 NOTE — Telephone Encounter (Signed)
Patient called in today and states she would like to be scheduled for PTNS

## 2020-12-25 NOTE — Telephone Encounter (Signed)
No PA required for PTNS. 12 PTNS treatments booked, patient confirmed.

## 2021-01-07 ENCOUNTER — Other Ambulatory Visit: Payer: Self-pay

## 2021-01-07 ENCOUNTER — Ambulatory Visit (INDEPENDENT_AMBULATORY_CARE_PROVIDER_SITE_OTHER): Payer: Medicare HMO | Admitting: Family Medicine

## 2021-01-07 DIAGNOSIS — N3946 Mixed incontinence: Secondary | ICD-10-CM

## 2021-01-07 NOTE — Patient Instructions (Signed)
Tracking Your Bladder Symptoms    Patient Name:___________________________________________________   Sample: Day   Daytime Voids  Nighttime Voids Urgency for the Day(0-4) Number of Accidents Beverage Comments  Monday IIII II 2 I Water IIII Coffee  I      Week Starting:____________________________________   Day Daytime  Voids Nighttime  Voids Urgency for  The Day(0-4) Number of Accidents Beverages Comments                                                           This week my symptoms were:  O much better  O better O the same O worse   

## 2021-01-07 NOTE — Progress Notes (Signed)
PTNS  Session # 1 of 45  Health & Social Factors: no change Caffeine: 3 Alcohol: 0 Daytime voids #per day: 7-8  Night-time voids #per night: 3 Urgency: severe Incontinence Episodes #per day: 3-4 Ankle used: left Treatment Setting: 9 Feeling/ Response: sensory Comments: Patient tolerated well  Performed By: Teressa Lower, CMA  Follow Up: 1 week #2 of 45

## 2021-01-14 ENCOUNTER — Ambulatory Visit (INDEPENDENT_AMBULATORY_CARE_PROVIDER_SITE_OTHER): Payer: Medicare HMO

## 2021-01-14 ENCOUNTER — Other Ambulatory Visit: Payer: Self-pay

## 2021-01-14 DIAGNOSIS — N3281 Overactive bladder: Secondary | ICD-10-CM | POA: Diagnosis not present

## 2021-01-14 NOTE — Patient Instructions (Signed)
Tracking Your Bladder Symptoms    Patient Name:___________________________________________________   Sample: Day   Daytime Voids  Nighttime Voids Urgency for the Day(0-4) Number of Accidents Beverage Comments  Monday IIII II 2 I Water IIII Coffee  I      Week Starting:____________________________________   Day Daytime  Voids Nighttime  Voids Urgency for  The Day(0-4) Number of Accidents Beverages Comments                                                           This week my symptoms were:  O much better  O better O the same O worse   

## 2021-01-14 NOTE — Progress Notes (Signed)
PTNS  Session # 2 of 45  Health & Social Factors: No changes  Caffeine: 2 Alcohol: 0 Daytime voids #per day: 3-4 Night-time voids #per night: 2-3 Urgency: 1-2 times a day Incontinence Episodes #per day: 0-2 accidents a day Ankle used: Right Treatment Setting: 5 Feeling/ Response: toe flex and sensory Comments: pt tolerated well  Performed By: Gerarda Gunther, RMA  Follow Up: 1 week

## 2021-01-15 ENCOUNTER — Ambulatory Visit: Payer: Medicare HMO | Admitting: Dermatology

## 2021-01-15 DIAGNOSIS — L219 Seborrheic dermatitis, unspecified: Secondary | ICD-10-CM

## 2021-01-15 DIAGNOSIS — B078 Other viral warts: Secondary | ICD-10-CM | POA: Diagnosis not present

## 2021-01-15 DIAGNOSIS — B070 Plantar wart: Secondary | ICD-10-CM

## 2021-01-15 MED ORDER — CLOBETASOL PROPIONATE 0.05 % EX SOLN: 1.0000 "application " | Freq: Two times a day (BID) | CUTANEOUS | 3 refills | Status: DC

## 2021-01-15 MED ORDER — KETOCONAZOLE 2 % EX SHAM: 1.0000 "application " | MEDICATED_SHAMPOO | Freq: Once | CUTANEOUS | 3 refills | Status: DC

## 2021-01-15 NOTE — Progress Notes (Signed)
Follow-Up Visit   Subjective  Cassandra [name] is a 65 y.o. female who presents for the following: Follow-up (Patient reports some bumps at scalp over a year. Patient changed shampoo but did not help.  Patient reports constantly itches. Patient also reports plantar wart at left foot, and wart at right hand. ). Frozen in past, but grew back.   The following portions of the chart were reviewed this encounter and updated as appropriate:      Review of Systems: No other skin or systemic complaints except as noted in HPI or Assessment and Plan.   Objective  Well appearing patient in no apparent distress; mood and affect are within normal limits.  A focused examination was performed including left foot, right hand. Relevant physical exam findings are noted in the Assessment and Plan.  Scalp Scattered pink brown scaly papules/plaques at scalp  right index lateral nail fold Verrucous plaque   left lateral plantar surface x 1 and left lateral heel x 1 (2) 5 mm Verrucous papule x 1 and 59mm verrucous papule at left heel x 1-- Discussed viral etiology and contagion.   Assessment & Plan  Seborrheic dermatitis Scalp  /psoriasis vs irritant seborrheic keratoses, with pruritus  Seborrheic Dermatitis  -  is a chronic persistent rash characterized by pinkness and scaling most commonly of the mid face but also can occur on the scalp (dandruff), ears; mid chest, mid back and groin.  It tends to be exacerbated by stress and cooler weather.  People who have neurologic disease may experience new onset or exacerbation of existing seborrheic dermatitis.  The condition is not curable but treatable and can be controlled.  Start Ketoconazole 2% shampoo apply three times per week, massage into scalp and leave in for several minutes before rinsing out Start Clobetasol solution Apply to affected areas scalp 2 times daily prn itch  If not improving, consider LN2 to itchy bumps (ISKs?)  clobetasol  (TEMOVATE) 0.05 % external solution - Scalp Apply 1 application topically 2 (two) times daily. Apply to itchy areas of scalp  ketoconazole (NIZORAL) 2 % shampoo - Scalp Apply 1 application topically once for 1 dose. apply three times per week, massage into scalp and leave in for 10 minutes before rinsing out  Periungual wart right index lateral nail fold  Discussed viral etiology and risk of spread.  Discussed multiple treatments may be required to clear warts.  Discussed possible post-treatment dyspigmentation and risk of recurrence.   Start [facility] wart solution Fluorouracil: 5% Salicylic acid: 30% Vehicle: Solution apply qHS and cover as directed 4 rf    Destruction of lesion - right index lateral nail fold  Destruction method: cryotherapy   Informed consent: discussed and consent obtained   Lesion destroyed using liquid nitrogen: Yes   Region frozen until ice ball extended beyond lesion: Yes   Outcome: patient tolerated procedure well with no complications   Post-procedure details: wound care instructions given   Additional details:  Prior to procedure, discussed risks of blister formation, small wound, skin dyspigmentation, or rare scar following cryotherapy. Recommend Vaseline ointment to treated areas while healing.   Destruction of lesion - right index lateral nail fold  Destruction method: chemical removal   Informed consent: discussed and consent obtained   Timeout:  patient name, date of birth, surgical site, and procedure verified Chemical destruction method: cantharidin   Application time:  6 hours Procedure instructions: patient instructed to wash and dry area   Outcome: patient tolerated procedure  well with no complications   Post-procedure details: wound care instructions given   Additional details:  Cantharidin Plus is a blistering agent that comes from a beetle.  It needs to be washed off in about 4 hours after application.  Although it is painless when  applied in office, it may cause symptoms of mild pain and burning several hours later.  Treated areas will swell and turn red, and blisters may form.  Vaseline and a bandaid may be applied until wound has healed.  Once healed, the skin may remain temporarily discolored.  It can take weeks to months for pigmentation to return to normal.  Advised to wash off with soap and water in 6 hours or sooner if it becomes tender before then.  Plantar wart (2) left lateral plantar surface x 1 and left lateral heel x 1  Discussed viral etiology and risk of spread.  Discussed multiple treatments may be required to clear warts.  Discussed possible post-treatment dyspigmentation and risk of recurrence.  Start skin medicinals wart solution Fluorouracil: 5% Salicylic acid: 30% Vehicle: Solution apply qhs and cover as directed 4 rf     Destruction of lesion - left lateral plantar surface x 1 and left lateral heel x 1  Destruction method: cryotherapy   Informed consent: discussed and consent obtained   Lesion destroyed using liquid nitrogen: Yes   Region frozen until ice ball extended beyond lesion: Yes   Outcome: patient tolerated procedure well with no complications   Post-procedure details: wound care instructions given   Additional details:  Prior to procedure, discussed risks of blister formation, small wound, skin dyspigmentation, or rare scar following cryotherapy. Recommend Vaseline ointment to treated areas while healing.   Destruction of lesion - left lateral plantar surface x 1 and left lateral heel x 1  Destruction method: chemical removal   Informed consent: discussed and consent obtained   Timeout:  patient name, date of birth, surgical site, and procedure verified Chemical destruction method: cantharidin   Chemical destruction method comment:  Plus Application time:  6 hours Procedure instructions: patient instructed to wash and dry area   Outcome: patient tolerated procedure well with no  complications   Post-procedure details: wound care instructions given   Additional details:  Cantharidin Plus is a blistering agent that comes from a beetle.  It needs to be washed off in about 4 hours after application.  Although it is painless when applied in office, it may cause symptoms of mild pain and burning several hours later.  Treated areas will swell and turn red, and blisters may form.  Vaseline and a bandaid may be applied until wound has healed.  Once healed, the skin may remain temporarily discolored.  It can take weeks to months for pigmentation to return to normal.  Advised to wash off with soap and water in 6 hours or sooner if it becomes tender before then.   Return for 1 month for seb derm and warts . I, Asher Muir, CMA, am acting as scribe for Willeen Niece, MD.  Documentation: I have reviewed the above documentation for accuracy and completeness, and I agree with the above.  Willeen Niece MD

## 2021-01-15 NOTE — Patient Instructions (Addendum)
Topical steroids (such as triamcinolone, fluocinolone, fluocinonide, mometasone, clobetasol, halobetasol, betamethasone, hydrocortisone) can cause thinning and lightening of the skin if they are used for too long in the same area. Your physician has selected the right strength medicine for your problem and area affected on the body. Please use your medication only as directed by your physician to prevent side effects.   Cantharidin Plus is a blistering agent that comes from a beetle.  It needs to be washed off in about 4 hours after application.  Although it is painless when applied in office, it may cause symptoms of mild pain and burning several hours later.  Treated areas will swell and turn red, and blisters may form.  Vaseline and a bandaid may be applied until wound has healed.  Once healed, the skin may remain temporarily discolored.  It can take weeks to months for pigmentation to return to normal.  Advised to wash off with soap and water in 6 hours or sooner if it becomes tender before then.     Instructions for [facility] Medications  One or more of your medications was sent to the [facility] mail order compounding pharmacy. You will receive an email from them and can purchase the medicine through that link. It will then be mailed to your home at the address you confirmed. If for any reason you do not receive an email from them, please check your spam folder. If you still do not find the email, please let us know. [facility] phone number is [phone].  If you have any questions or concerns for your doctor, please call our main line at [phone] and press option 4 to reach your doctor's medical assistant. If no one answers, please leave a voicemail as directed and we will return your call as soon as possible. Messages left after 4 pm will be answered the following business day.   You may also send [place] a message via MyChart. We typically respond to MyChart messages within 1-2  business days.  For prescription refills, please ask your pharmacy to contact our office. Our fax number is [phone].  If you have an urgent issue when the clinic is closed that cannot wait until the next business day, you can page your doctor at the number below.    Please note that while we do our best to be available for urgent issues outside of office hours, we are not available 24/7.   If you have an urgent issue and are unable to reach [place], you may choose to seek medical care at your doctor's office, retail clinic, urgent care center, or emergency room.  If you have a medical emergency, please immediately call 911 or go to the emergency department.  Pager Numbers  - Dr. Gwen Pounds: [phone]  - Dr. Neale Burly: [phone]  - Dr. Roseanne Reno: [phone]  In the event of inclement weather, please call our main line at [phone] for an update on the status of any delays or closures.  Dermatology Medication Tips: Please keep the boxes that topical medications come in in order to help keep track of the instructions about where and how to use these. Pharmacies typically print the medication instructions only on the boxes and not directly on the medication tubes.   If your medication is too expensive, please contact our office at [phone] option 4 or send [place] a message through MyChart.   We are unable to tell what your co-pay for medications will be in advance as this is different  depending on your insurance coverage. However, we may be able to find a substitute medication at lower cost or fill out paperwork to get insurance to cover a needed medication.   If a prior authorization is required to get your medication covered by your insurance company, please allow [place] 1-2 business days to complete this process.  Drug prices often vary depending on where the prescription is filled and some pharmacies may offer cheaper prices.  The website www.goodrx.com contains coupons for medications  through different pharmacies. The prices here do not account for what the cost may be with help from insurance (it may be cheaper with your insurance), but the website can give you the price if you did not use any insurance.  - You can print the associated coupon and take it with your prescription to the pharmacy.  - You may also stop by our office during regular business hours and pick up a GoodRx coupon card.  - If you need your prescription sent electronically to a different pharmacy, notify our office through [facility] or by phone at [phone] option 4.

## 2021-01-21 ENCOUNTER — Ambulatory Visit (INDEPENDENT_AMBULATORY_CARE_PROVIDER_SITE_OTHER): Payer: Medicare HMO | Admitting: Family Medicine

## 2021-01-21 ENCOUNTER — Other Ambulatory Visit: Payer: Self-pay

## 2021-01-21 DIAGNOSIS — N3281 Overactive bladder: Secondary | ICD-10-CM | POA: Diagnosis not present

## 2021-01-21 NOTE — Progress Notes (Signed)
PTNS  Session # 3  Health & Social Factors: no change Caffeine: 1 Alcohol: 0 Daytime voids #per day: 3 Night-time voids #per night: 2-3 Urgency: mild Incontinence Episodes #per day: 0 Ankle used: right Treatment Setting: 11 Feeling/ Response: sensory Comments: Patient tolerated well  Performed By: Teressa Lower, CMA  Follow Up: 1 week #4

## 2021-01-21 NOTE — Patient Instructions (Signed)
Tracking Your Bladder Symptoms    Patient Name:___________________________________________________   Sample: Day   Daytime Voids  Nighttime Voids Urgency for the Day(0-4) Number of Accidents Beverage Comments  Monday IIII II 2 I Water IIII Coffee  I      Week Starting:____________________________________   Day Daytime  Voids Nighttime  Voids Urgency for  The Day(0-4) Number of Accidents Beverages Comments                                                           This week my symptoms were:  O much better  O better O the same O worse   

## 2021-01-28 ENCOUNTER — Ambulatory Visit: Payer: Medicare HMO

## 2021-01-29 ENCOUNTER — Other Ambulatory Visit: Payer: Self-pay

## 2021-01-29 ENCOUNTER — Ambulatory Visit: Payer: Medicare HMO | Admitting: *Deleted

## 2021-01-29 DIAGNOSIS — N3281 Overactive bladder: Secondary | ICD-10-CM

## 2021-01-29 NOTE — Progress Notes (Signed)
PTNS   Session # 4   Health & Social Factors: no change Caffeine: 1 Alcohol: 0 Daytime voids #per day: 3 Night-time voids #per night: 2-3 Urgency: mild Incontinence Episodes #per day: 0 Ankle used: right Treatment Setting: 10 Feeling/ Response: sensory Comments: Patient tolerated well   Performed By: Ples Specter CMA    Follow Up: 1 week #5

## 2021-01-29 NOTE — Patient Instructions (Signed)
Tracking Your Bladder Symptoms    Patient Name:___________________________________________________   Sample: Day   Daytime Voids  Nighttime Voids Urgency for the Day(0-4) Number of Accidents Beverage Comments  Monday IIII II 2 I Water IIII Coffee  I      Week Starting:____________________________________   Day Daytime  Voids Nighttime  Voids Urgency for  The Day(0-4) Number of Accidents Beverages Comments                                                           This week my symptoms were:  O much better  O better O the same O worse   

## 2021-02-04 ENCOUNTER — Other Ambulatory Visit: Payer: Self-pay

## 2021-02-04 ENCOUNTER — Ambulatory Visit (INDEPENDENT_AMBULATORY_CARE_PROVIDER_SITE_OTHER): Payer: Medicare HMO | Admitting: *Deleted

## 2021-02-04 DIAGNOSIS — N3281 Overactive bladder: Secondary | ICD-10-CM

## 2021-02-04 NOTE — Progress Notes (Signed)
PTNS   Session # 5   Health & Social Factors: no change Caffeine: 1 Alcohol: 0 Daytime voids #per day: 4-5 Night-time voids #per night: 2-3 Urgency: mild Incontinence Episodes #per day: 0 Ankle used: right Treatment Setting: 5 Feeling/ Response: sensory Comments: Patient tolerated well   Performed By: Ples Specter CMA    Follow Up: 1 week #6

## 2021-02-04 NOTE — Patient Instructions (Signed)
Tracking Your Bladder Symptoms    Patient Name:___________________________________________________   Sample: Day   Daytime Voids  Nighttime Voids Urgency for the Day(0-4) Number of Accidents Beverage Comments  Monday IIII II 2 I Water IIII Coffee  I      Week Starting:____________________________________   Day Daytime  Voids Nighttime  Voids Urgency for  The Day(0-4) Number of Accidents Beverages Comments                                                           This week my symptoms were:  O much better  O better O the same O worse   

## 2021-02-11 ENCOUNTER — Other Ambulatory Visit: Payer: Self-pay

## 2021-02-11 ENCOUNTER — Ambulatory Visit (INDEPENDENT_AMBULATORY_CARE_PROVIDER_SITE_OTHER): Payer: Medicare HMO

## 2021-02-11 DIAGNOSIS — N3281 Overactive bladder: Secondary | ICD-10-CM | POA: Diagnosis not present

## 2021-02-11 DIAGNOSIS — N3946 Mixed incontinence: Secondary | ICD-10-CM

## 2021-02-11 NOTE — Patient Instructions (Signed)
Tracking Your Bladder Symptoms    Patient Name:___________________________________________________   Sample: Day   Daytime Voids  Nighttime Voids Urgency for the Day(0-4) Number of Accidents Beverage Comments  Monday IIII II 2 I Water IIII Coffee  I      Week Starting:____________________________________   Day Daytime  Voids Nighttime  Voids Urgency for  The Day(0-4) Number of Accidents Beverages Comments                                                           This week my symptoms were:  O much better  O better O the same O worse   

## 2021-02-11 NOTE — Progress Notes (Signed)
PTNS  Session # 6  Health & Social Factors: No Change Caffeine: 1 Alcohol: 0 Daytime voids #per day: 4 Night-time voids #per night: 3 Urgency: Mild Incontinence Episodes #per day: 1   Ankle used: Left Treatment Setting: 5 Feeling/ Response: Sensory & Toe Flex  Comments: N/A  Performed By: Debbe Bales, CMA   Follow Up: RTC in 1 week as scheduled

## 2021-02-18 ENCOUNTER — Other Ambulatory Visit: Payer: Self-pay

## 2021-02-18 ENCOUNTER — Ambulatory Visit (INDEPENDENT_AMBULATORY_CARE_PROVIDER_SITE_OTHER): Payer: Medicare HMO | Admitting: Family Medicine

## 2021-02-18 DIAGNOSIS — N3281 Overactive bladder: Secondary | ICD-10-CM | POA: Diagnosis not present

## 2021-02-18 NOTE — Patient Instructions (Signed)
Tracking Your Bladder Symptoms    Patient Name:___________________________________________________   Sample: Day   Daytime Voids  Nighttime Voids Urgency for the Day(0-4) Number of Accidents Beverage Comments  Monday IIII II 2 I Water IIII Coffee  I      Week Starting:____________________________________   Day Daytime  Voids Nighttime  Voids Urgency for  The Day(0-4) Number of Accidents Beverages Comments                                                           This week my symptoms were:  O much better  O better O the same O worse   

## 2021-02-18 NOTE — Progress Notes (Signed)
PTNS  Session # 7  Health & Social Factors: no change Caffeine: 1 Alcohol: 0 Daytime voids #per day: 3 Night-time voids #per night: 3 Urgency: mild Incontinence Episodes #per day: 1 Ankle used: right Treatment Setting: 9 Feeling/ Response: sensory Comments: patient tolerated well  Performed By: Teressa Lower, CMA  Follow Up: 1 week #8

## 2021-02-19 ENCOUNTER — Ambulatory Visit: Payer: Medicare HMO | Admitting: Dermatology

## 2021-02-19 DIAGNOSIS — B078 Other viral warts: Secondary | ICD-10-CM | POA: Diagnosis not present

## 2021-02-19 DIAGNOSIS — B070 Plantar wart: Secondary | ICD-10-CM | POA: Diagnosis not present

## 2021-02-19 DIAGNOSIS — L219 Seborrheic dermatitis, unspecified: Secondary | ICD-10-CM | POA: Diagnosis not present

## 2021-02-19 MED ORDER — KETOCONAZOLE 2 % EX SHAM: 1.0000 "application " | MEDICATED_SHAMPOO | Freq: Once | CUTANEOUS | 3 refills | Status: AC

## 2021-02-19 NOTE — Progress Notes (Signed)
Follow-Up Visit   Subjective  Cassandra [name] is a 65 y.o. female who presents for the following: Follow-up (Patient here today for follow up on seborrheic dermatitis at scalp. She has been using ketoconazole shampoo three times weekly and clobetasol solution as needed for itchy places at scalp. She reports that scalp has improved it doing much better.  She is here for follow up for warts at hand and foot. She reports they are clear today. ).   The following portions of the chart were reviewed this encounter and updated as appropriate:      Review of Systems: No other skin or systemic complaints except as noted in HPI or Assessment and Plan.   Objective  Well appearing patient in no apparent distress; mood and affect are within normal limits.  A focused examination was performed including left foot, right index finger, and scalp. Relevant physical exam findings are noted in the Assessment and Plan.  Scalp  Pink brown slighty raised scaly papules on parietal scalp, occipital scalp, and crown.   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  It tends to be exacerbated by stress and cooler weather.  People who have neurologic disease may experience new onset or exacerbation of existing seborrheic dermatitis.  The condition is not curable but treatable and can be controlled.  Continue ketoconazole shampoo apply three times per week, massage into scalp and leave in for 10 minutes before rinsing  out  Continue with clobetasol 0.05 % solution qd/bid as needed for raised itchy bumps  Avoid picking/scratching  Topical steroids (such as triamcinolone, fluocinolone, fluocinonide, mometasone, clobetasol, halobetasol, betamethasone, hydrocortisone) can cause thinning and lightening of the skin if they are used for too long in the same area. Your physician has selected the right strength medicine for your problem and area affected on the body. Please use your medication only as directed by your physician to prevent side effects.     ketoconazole (NIZORAL) 2 % shampoo - Scalp Apply 1 application topically once for 1 dose. apply three times per week, massage into scalp and leave in for 10 minutes before rinsing out  Related Medications clobetasol (TEMOVATE) 0.05 % external solution Apply 1 application topically 2 (two) times daily. Apply to itchy areas of scalp  Plantar wart Left Lateral Plantar Surface, left lateral heel  Improving Pt declines cryotherapy  Discussed viral etiology and risk of spread.  Discussed multiple treatments may be required to clear warts.  Discussed possible post-treatment dyspigmentation and risk of recurrence.  For left lateral heel  Continue prescription 5-fluorouracil/salicylic acid wart paste from [facility] nightly under occlusion. Reviewed risk of irritation and risk scarring if applied to normal skin. If irritation develops, stop medication for a few days until area calm, then restart a very small amount just to the wart.   Periungual wart Right 3rd Finger Proximal Nail Fold  Improving Pt declines cryotherapy Continue  prescription 5-fluorouracil/salicylic acid wart paste from [facility] nightly under occlusion. Reviewed risk of  irritation and risk scarring if applied to normal skin. If irritation develops, stop medication for a few days until area calm, then restart a very small amount just to the wart.    Return if symptoms worsen or fail  to improve. I, Asher Muir, CMA, am acting as scribe for Willeen Niece, MD.  Documentation: I have reviewed the above documentation for accuracy and completeness, and I agree with the above.  Willeen Niece MD

## 2021-02-19 NOTE — Patient Instructions (Addendum)
Continue  prescription 5-fluorouracil/salicylic acid wart paste from [facility] nightly under occlusion. Reviewed risk of irritation and risk scarring if applied to normal skin. If irritation develops, stop medication for a few days until area calm, then restart a very small amount just to the wart. This medication cannot be used by pregnant women. Patient advised they will receive an email from the [facility] pharmacy and can purchase the medication online through a link in the email.    If You Need Anything After Your Visit  If you have any questions or concerns for your doctor, please call our main line at [phone] and press option 4 to reach your doctor's medical assistant. If no one answers, please leave a voicemail as directed and we will return your call as soon as possible. Messages left after 4 pm will be answered the following business day.   You may also send [place] a message via MyChart. We typically respond to MyChart messages within 1-2 business days.  For prescription refills, please ask your pharmacy to contact our office. Our fax number is [phone].  If you have an urgent issue when the clinic is closed that cannot wait until the next business day, you can page your doctor at the number below.    Please note that while we do our best to be available for urgent issues outside of office hours, we are not available 24/7.   If you have an urgent issue and are unable to reach [place], you may choose to seek medical care at your doctor's office, retail clinic, urgent care center, or emergency room.  If you have a medical emergency, please immediately call 911 or go to the emergency department.  Pager Numbers  - Dr. Gwen Pounds: [phone]  - Dr. Neale Burly: [phone]  - Dr. Roseanne Reno: [phone]  In the event of inclement weather, please call our main line at [phone] for an update on the status of any delays or closures.  Dermatology Medication Tips: Please keep  the boxes that topical medications come in in order to help keep track of the instructions about where and how to use these. Pharmacies typically print the medication instructions only on the boxes and not directly on the medication tubes.   If your medication is too expensive, please contact our office at [phone] option 4 or send [place] a message through MyChart.   We are unable to tell what your co-pay for medications will be in advance as this is different depending on your insurance coverage. However, we may be able to find a substitute medication at lower cost or fill out paperwork to get insurance to cover a needed medication.   If a prior authorization is required to get your medication covered by your insurance company, please allow [place] 1-2 business days to complete this process.  Drug prices often vary depending on where the prescription is filled and some pharmacies may offer cheaper prices.  The website www.goodrx.com contains coupons for medications through different pharmacies. The prices here do not account for what the cost may be with help from insurance (it may be cheaper with your insurance), but the website can give you the price if you did not use any insurance.  - You can print the associated coupon and take it with your prescription to the pharmacy.  - You may also stop by our office during regular business hours and pick up a GoodRx coupon card.  - If you need your prescription sent electronically to a different pharmacy,  notify our office through [facility] or by phone at [phone] option 4.     Si Usted Necesita Algo Despus de Su Visita  Tambin puede enviarnos un mensaje a travs de Clinical cytogeneticist. Por lo general respondemos a los mensajes de MyChart en el transcurso de 1 a 2 das hbiles.  Para renovar recetas, por favor pida a su farmacia que se ponga en contacto con nuestra oficina. Annie Sable de fax es [place] [phone].  Si tiene un asunto urgente cuando  la clnica est cerrada y que no puede esperar hasta el siguiente da hbil, puede llamar/localizar a su doctor(a) al nmero que aparece a continuacin.   Por favor, tenga en cuenta que aunque hacemos todo lo posible para estar disponibles para asuntos urgentes fuera del horario de [place], no estamos disponibles las 24 horas del da, los 7 [address]  [address] de la [place].   Si tiene un problema urgente y no puede comunicarse con nosotros, puede optar por buscar atencin mdica  en el consultorio de su doctor(a), en una clnica privada, en un centro de atencin urgente o en una sala de emergencias.  Si tiene Engineer, drilling, por favor llame inmediatamente al 911 o vaya a la sala de emergencias.  Nmeros de bper  - Dr. Gwen Pounds: [phone]  - Dra. Moye: [phone]  - Dra. Roseanne Reno: [phone]  En caso de inclemencias del [place], por favor llame a Lacy Duverney principal al [phone] para una actualizacin sobre el [place] de cualquier retraso o cierre.  Consejos para la medicacin en dermatologa: Por favor, guarde las cajas en las que vienen los medicamentos de uso tpico para ayudarle a seguir las instrucciones sobre dnde y cmo usarlos. Las farmacias generalmente imprimen las instrucciones del medicamento slo en las cajas y no directamente en los tubos del [place].   Si su medicamento es muy caro, por favor, pngase en contacto con Rolm Gala llamando al [phone] y presione la opcin 4 o envenos un mensaje a travs de Clinical cytogeneticist.   No podemos decirle cul ser su copago por los medicamentos por adelantado ya que esto es diferente dependiendo de la cobertura de su seguro. Sin embargo, es posible que podamos encontrar un medicamento sustituto a Audiological scientist un formulario para que el seguro cubra el medicamento que se considera necesario.   Si se requiere una autorizacin previa para que su compaa de seguros [place] su medicamento, por favor permtanos de 1 a 2 das  hbiles para completar [address]  Los precios de los medicamentos varan con frecuencia dependiendo del Environmental consultant de dnde se surte la receta y alguna farmacias pueden ofrecer precios ms baratos.  El sitio web www.goodrx.com tiene cupones para medicamentos de Health and safety inspector. Los precios aqu no tienen en cuenta lo que podra costar con la ayuda del seguro (puede ser ms barato con su seguro), pero el sitio web puede darle el precio si no utiliz Tourist information centre manager.  - Puede imprimir el cupn correspondiente y llevarlo con su receta a la farmacia.  - Tambin puede pasar por nuestra oficina durante el horario de atencin regular y Education officer, museum una tarjeta de cupones de GoodRx.  - Si necesita que su receta se enve electrnicamente a una farmacia diferente, informe a nuestra oficina a travs de MyChart de [place] o por telfono llamando al [phone] y presione la opcin 4.

## 2021-02-25 ENCOUNTER — Other Ambulatory Visit: Payer: Self-pay

## 2021-02-25 ENCOUNTER — Ambulatory Visit (INDEPENDENT_AMBULATORY_CARE_PROVIDER_SITE_OTHER): Payer: Medicare HMO | Admitting: *Deleted

## 2021-02-25 DIAGNOSIS — N3281 Overactive bladder: Secondary | ICD-10-CM | POA: Diagnosis not present

## 2021-02-25 NOTE — Patient Instructions (Signed)
Tracking Your Bladder Symptoms    Patient Name:___________________________________________________   Sample: Day   Daytime Voids  Nighttime Voids Urgency for the Day(0-4) Number of Accidents Beverage Comments  Monday IIII II 2 I Water IIII Coffee  I      Week Starting:____________________________________   Day Daytime  Voids Nighttime  Voids Urgency for  The Day(0-4) Number of Accidents Beverages Comments                                                           This week my symptoms were:  O much better  O better O the same O worse   

## 2021-02-25 NOTE — Progress Notes (Deleted)
PTNS  Session # ***  Health & Social Factors: *** Caffeine: *** Alcohol: *** Daytime voids #per day: *** Night-time voids #per night: *** Urgency: *** Incontinence Episodes #per day: *** Ankle used: *** Treatment Setting: *** Feeling/ Response: *** Comments: ***  Performed By: ***  Follow Up: ***  

## 2021-02-25 NOTE — Progress Notes (Signed)
PTNS  Session # 8    Health & Social Factors: no change Caffeine: 1 Alcohol: 0 Daytime voids #per day: 4 Night-time voids #per night: 3 Urgency: mild Incontinence Episodes #per day: 1 Ankle used: Left Treatment Setting: 6 Feeling/ Response: sensory Comments: patient tolerated well  Follow PY:KDXI week

## 2021-03-04 ENCOUNTER — Other Ambulatory Visit: Payer: Self-pay

## 2021-03-04 ENCOUNTER — Ambulatory Visit (INDEPENDENT_AMBULATORY_CARE_PROVIDER_SITE_OTHER): Payer: Medicare HMO | Admitting: Family Medicine

## 2021-03-04 DIAGNOSIS — N3281 Overactive bladder: Secondary | ICD-10-CM | POA: Diagnosis not present

## 2021-03-04 NOTE — Progress Notes (Signed)
PTNS  Session # 9  Health & Social Factors: no change Caffeine: 0-1 Alcohol: 0 Daytime voids #per day: 3-4 Night-time voids #per night: 3 Urgency: mild Incontinence Episodes #per day: 1 Ankle used: right Treatment Setting: 11 Feeling/ Response: sensory Comments: patient tolerated well  Performed By: Teressa Lower, CMA  Follow Up: 1 week #10

## 2021-03-04 NOTE — Patient Instructions (Signed)
Tracking Your Bladder Symptoms    Patient Name:___________________________________________________   Sample: Day   Daytime Voids  Nighttime Voids Urgency for the Day(0-4) Number of Accidents Beverage Comments  Monday IIII II 2 I Water IIII Coffee  I      Week Starting:____________________________________   Day Daytime  Voids Nighttime  Voids Urgency for  The Day(0-4) Number of Accidents Beverages Comments                                                           This week my symptoms were:  O much better  O better O the same O worse   

## 2021-03-12 ENCOUNTER — Ambulatory Visit (INDEPENDENT_AMBULATORY_CARE_PROVIDER_SITE_OTHER): Payer: Medicare HMO | Admitting: *Deleted

## 2021-03-12 ENCOUNTER — Other Ambulatory Visit: Payer: Self-pay

## 2021-03-12 DIAGNOSIS — N3281 Overactive bladder: Secondary | ICD-10-CM | POA: Diagnosis not present

## 2021-03-12 NOTE — Patient Instructions (Signed)
Tracking Your Bladder Symptoms    Patient Name:___________________________________________________   Sample: Day   Daytime Voids  Nighttime Voids Urgency for the Day(0-4) Number of Accidents Beverage Comments  Monday IIII II 2 I Water IIII Coffee  I      Week Starting:____________________________________   Day Daytime  Voids Nighttime  Voids Urgency for  The Day(0-4) Number of Accidents Beverages Comments                                                           This week my symptoms were:  O much better  O better O the same O worse   

## 2021-03-12 NOTE — Progress Notes (Signed)
Session # 10   Health & Social Factors: no change Caffeine: 0-1 Alcohol: 0 Daytime voids #per day: 3 Night-time voids #per night: 3 Urgency: mild Incontinence Episodes #per day: 1 Ankle used: right Treatment Setting: 13 Feeling/ Response: sensory Comments: patient tolerated well    Performed by: Milas Kocher, CMA  Follow up: 1 week #11

## 2021-03-19 ENCOUNTER — Ambulatory Visit (INDEPENDENT_AMBULATORY_CARE_PROVIDER_SITE_OTHER): Payer: Medicare HMO | Admitting: Family Medicine

## 2021-03-19 ENCOUNTER — Other Ambulatory Visit: Payer: Self-pay

## 2021-03-19 DIAGNOSIS — N3281 Overactive bladder: Secondary | ICD-10-CM | POA: Diagnosis not present

## 2021-03-19 NOTE — Progress Notes (Signed)
PTNS  Session # 11  Health & Social Factors: no change Caffeine: 0-1 Alcohol: 0 Daytime voids #per day: 4 Night-time voids #per night: 2-3 Urgency: mild Incontinence Episodes #per day: 0-1 Ankle used: right Treatment Setting: 10 Feeling/ Response: sensory Comments: patient tolerated well  Performed By: Teressa Lower, CMA  Follow Up: 1 week #12

## 2021-03-19 NOTE — Patient Instructions (Signed)
Tracking Your Bladder Symptoms    Patient Name:___________________________________________________   Sample: Day   Daytime Voids  Nighttime Voids Urgency for the Day(0-4) Number of Accidents Beverage Comments  Monday IIII II 2 I Water IIII Coffee  I      Week Starting:____________________________________   Day Daytime  Voids Nighttime  Voids Urgency for  The Day(0-4) Number of Accidents Beverages Comments                                                           This week my symptoms were:  O much better  O better O the same O worse   

## 2021-03-25 ENCOUNTER — Ambulatory Visit: Payer: Medicare HMO

## 2021-03-25 ENCOUNTER — Other Ambulatory Visit: Payer: Self-pay

## 2021-03-25 DIAGNOSIS — N3946 Mixed incontinence: Secondary | ICD-10-CM

## 2021-03-25 NOTE — Progress Notes (Signed)
PTNS  Session # 12  Health & Social Factors: None Caffeine: 0-1 Alcohol: 0 Daytime voids #per day: 4 Night-time voids #per night: 2 Urgency: Mild Incontinence Episodes #per day: 1 Ankle used: Left Treatment Setting: 9 Feeling/ Response: Sensory & Toe Flex Comments: Pt notes no improvement in urinary symptoms   Performed By: Debbe Bales, CMA   Follow Up: RTC in 1 month for re-evaluation of symptoms.

## 2021-05-06 ENCOUNTER — Ambulatory Visit: Payer: Medicare HMO | Admitting: Urology

## 2021-05-13 ENCOUNTER — Other Ambulatory Visit: Payer: Self-pay

## 2021-05-13 ENCOUNTER — Ambulatory Visit: Payer: Medicare HMO | Admitting: Urology

## 2021-05-13 ENCOUNTER — Encounter: Payer: Self-pay | Admitting: Urology

## 2021-05-13 VITALS — BP 156/96 | HR 76 | Ht 64.0 in | Wt 222.0 lb

## 2021-05-13 DIAGNOSIS — N3946 Mixed incontinence: Secondary | ICD-10-CM

## 2021-05-13 MED ORDER — CEPHALEXIN 500 MG PO CAPS: 500.0000 mg | ORAL_CAPSULE | Freq: Three times a day (TID) | ORAL | 0 refills | Status: DC

## 2021-05-13 NOTE — Progress Notes (Signed)
05/13/2021 2:21 PM   Cassandra [name] Jul 02, 1955 [id number]  Referring provider: Wayland Denis, PA-C [address] [place],  [place] [address]  Chief Complaint  Patient presents with   Urinary Incontinence    HPI: Patient was seen by my partners for mixed incontinence treated with Ditropan XL 15 mg.  Was noted to have prolapse on pelvic examination   Patient reports urge incontinence.  She can leak with coughing sneezing but not bending lifting.  She thinks her stress component is more significant.  She has no bedwetting.  She wears 2-3 pads a day sometimes they are soaked and other times damp  She voids every 90 minutes to 2 hours and gets up 3 times a night.  I think sometimes she takes a diuretic for ankle edema  She has not had a hysterectomy.  She feels like something is in the vagina but was a bit nonspecific that something was hanging out.  She was a partial responder to [employer].  She is currently a partial responder to oxybutynin.   On pelvic examination patient had good vaginal length.  She had little to no cystocele or grade 1.  She had mild bulging of the posterior vaginal wall near the introitus but I could feel a moderate amount of stool in the rectum.  She really did not have much of a defect.  She did not have perineal bulging.  There would be really minimal defect to repair under anesthesia with almost no central defect whatsoever.  Uterus is well supported   Patient has mixed incontinence.  She has frequency and nocturia.  She has prolapse symptoms.  She has not had a hysterectomy.  The patient's symptoms were nonspecific and I think related to her constipation which is obvious associated with a mild posterior distal defect.  Certainly surgery would not fix this.  Picture was drawn.  I like to see her back in about 6 weeks for pelvic examination and cystoscopy on new beta 3 agonist samples.  I think it be reasonable at least to stop the oxybutynin  because of the constipation.  She did think oxybutynin worsens her constipation.  Picture was drawn regarding the prolapse.     Since stopping the oxybutynin bowel symptoms and rectal symptoms have greatly improved.  Today on pelvic examination she had a high small grade 2 cystocele that did not reach the urethrovesical angle.  She had a distal rectocele that was soft with no obvious constipation.  No stress incontinence  Cystoscopy: normal  She failed the beta 3 agonist.  It may have helped some.  Main symptom is still urgency and frequency.   Patient has mixed incontinence.  She has failed medication.  She appears to have an altered affect and I am not certain that surgery is in her best interest.     Patient did not void prior to the study and was catheterized for a few milliliters.  Maximum bladder capacity was 145 mL with increased bladder sensation.  She had multiple unstable contractions that were very uncomfortable to the point that she was nauseous.  One of the contraction pressures reach 125 cm of water and she did not leak.  Others reached a pressure of 78 cm of water.  The overactivity was very impressive.  She had no stress incontinence with Valsalva pressure of 100 cm of water.  During voluntary voiding she voided 63 mL with a max of flow 70 mils per second.  Maximum voiding pressure  35 cm of water.  Residual was 84 mL.  EMG was quiet during voiding.  Bladder neck descended 1 cm.    Patient has refractory overactive bladder.  Her bladder overactivity and bladder pressures were very impressive.  I do not see any obvious risk factors for this.  I went over 3 refractory treatments as full template and she wants to do percutaneous tibial nerve stimulation but think about the others.  She got very nauseated and was very uncomfortable with the urodynamics as noted.  I think if she ever had the peripheral nerve evaluation to be best done in the operating room  Today Frequency stable.  Patient  failed percutaneous tibial nerve stimulation.  Clinically not infected.  She would like to proceed with Botox.  She has a lot of allergies so I gave her 3 days of Keflex and follow the usual protocol.          PMH: Past Medical History:  Diagnosis Date   Allergy    Anxiety    Complication of anesthesia    difficulty waking up   Depression    Dysrhythmia    svt   GERD (gastroesophageal reflux disease)    Heart murmur    never seen cardiologist for this   Hypertension    Leukocytoclastic vasculitis (HCC)    Dr. Chase Caller and Dr. Estanislado Pandy   Migraines    maybe one a year   Renal disorder    Renal insufficiency    Sleep apnea    cpap   SVT (supraventricular tachycardia) ([facility])    Type 2 diabetes mellitus with kidney complication, without long-term current use of insulin ([facility]) 05/07/2016   Verrucae vulgaris    Wegner's disease (congenital syphilitic osteochondritis)     Surgical History: Past Surgical History:  Procedure Laterality Date   BUNIONECTOMY Bilateral    CARPAL TUNNEL RELEASE Right 2002   JOINT REPLACEMENT Right    knee   LOWER EXTREMITY VENOGRAPHY Right 07/14/2016   Procedure: Lower Extremity Venography;  Surgeon: Waynetta Sandy, MD;  Location: [facility];  Service: Cardiovascular;  Laterality: Right;   PERCUTANEOUS VENOUS THROMBECTOMY,LYSIS WITH INTRAVASCULAR ULTRASOUND (IVUS) Right 07/17/2016   Procedure: PERCUTANEOUS VENOUS THROMBECTOMY AND LYSIS WITH INTRAVASCULAR ULTRASOUND (IVUS) of right lower extermity with balloon angioplasty;  Surgeon: Waynetta Sandy, MD;  Location: [facility];  Service: Vascular;  Laterality: Right;   SVT ABLATION N/A 02/20/2017   Procedure: SVT ABLATION;  Surgeon: Evans Lance, MD;  Location: [facility];  Service: Cardiovascular;  Laterality: N/A;   TONSILLECTOMY  08/2002   TOTAL KNEE ARTHROPLASTY Right    TOTAL KNEE ARTHROPLASTY Left 02/21/2014   Procedure: LEFT TOTAL KNEE ARTHROPLASTY;  Surgeon: Hessie Dibble, MD;  Location: [facility];  Service: Orthopedics;  Laterality: Left;   TUBAL LIGATION  1980   ULTRASOUND GUIDANCE FOR VASCULAR ACCESS Right 07/17/2016   Procedure: ULTRASOUND GUIDANCE FOR VASCULAR ACCESS;  Surgeon: Waynetta Sandy, MD;  Location: [facility];  Service: Vascular;  Laterality: Right;   VENOGRAM Right 07/17/2016   Procedure: right ASCENDING VENOGRAM WITH ANGIOJET;  Surgeon: Waynetta Sandy, MD;  Location: [facility];  Service: Vascular;  Laterality: Right;    Home Medications:  Allergies as of 05/13/2021       Reactions   Nitrofurantoin Itching, Shortness Of Breath   Effexor [venlafaxine] Hives   Myrbetriq [mirabegron] Other (See Comments)   BACK PAIN DRY MOUTH   Nsaids Other (See Comments), Hives   Hypersensitivity  vasculitis VASCULITIS   Reglan [metoclopramide] Other (See Comments)   Blister in mouth    Clarithromycin    UNSPECIFIED REACTION    Flagyl [metronidazole]    UNSPECIFIED REACTION    Ketoprofen    Levofloxacin    Paxil [paroxetine]    UNSPECIFIED REACTION    Tolterodine Other (See Comments)   Ampicillin Rash   Cholecalciferol Rash   Codeine Other (See Comments)   Head feels like its crawling   Fluoxetine Hcl Itching   Deep Itch   Sulfa Antibiotics Other (See Comments), Nausea And Vomiting   Stomach irritation Stomach irritation   Tetracycline Nausea And Vomiting   Severe stomach pain        Medication List        Accurate as of May 13, 2021  2:21 PM. If you have any questions, ask your nurse or doctor.          STOP taking these medications    busPIRone 10 MG tablet Commonly known as: BUSPAR Stopped by: Reece Packer, MD   furosemide 20 MG tablet Commonly known as: LASIX Stopped by: Reece Packer, MD   hydrOXYzine 25 MG tablet Commonly known as: ATARAX Stopped by: Reece Packer, MD       TAKE these medications    alendronate 70 MG tablet Commonly known as: FOSAMAX Take 70 mg by mouth once  a week. Take with a full glass of water on an empty stomach.   atorvastatin 20 MG tablet Commonly known as: LIPITOR Take 20 mg by mouth daily.   azelastine 0.1 % nasal spray Commonly known as: ASTELIN Place into the nose.   CHOLECALCIFEROL PO Take 500 Units by mouth daily.   clobetasol 0.05 % external solution Commonly known as: TEMOVATE Apply 1 application topically 2 (two) times daily. Apply to itchy areas of scalp   Dexlansoprazole 30 MG capsule DR Take 1 capsule by mouth daily.   montelukast 10 MG tablet Commonly known as: SINGULAIR Take by mouth.   Multi-Vitamin tablet Take by mouth daily.   sertraline 100 MG tablet Commonly known as: ZOLOFT TAKE 1 TABLET BY MOUTH EVERY NIGHT AT BEDTIME        Allergies:  Allergies  Allergen Reactions   Nitrofurantoin Itching and Shortness Of Breath   Effexor [Venlafaxine] Hives   Myrbetriq [Mirabegron] Other (See Comments)    BACK PAIN DRY MOUTH   Nsaids Other (See Comments) and Hives    Hypersensitivity vasculitis VASCULITIS   Reglan [Metoclopramide] Other (See Comments)    Blister in mouth    Clarithromycin     UNSPECIFIED REACTION    Flagyl [Metronidazole]     UNSPECIFIED REACTION    Ketoprofen    Levofloxacin    Paxil [Paroxetine]     UNSPECIFIED REACTION    Tolterodine Other (See Comments)   Ampicillin Rash   Cholecalciferol Rash   Codeine Other (See Comments)    Head feels like its crawling   Fluoxetine Hcl Itching    Deep Itch    Sulfa Antibiotics Other (See Comments) and Nausea And Vomiting    Stomach irritation Stomach irritation   Tetracycline Nausea And Vomiting    Severe stomach pain    Family History: Family History  Problem Relation Age of Onset   Clotting disorder Mother 35       Blood clot, foot amputation   Cervical cancer Mother    Arthritis Sister    Heart attack Maternal Grandmother 74   Breast cancer Sister  30   Asthma Cousin     Social History:  reports that she has never  smoked. She has never used smokeless tobacco. She reports that she does not drink alcohol and does not use drugs.  ROS:                                        Physical Exam: BP (!) 156/96    Pulse 76    Ht 5\' 4"  (1.626 m)    Wt 100.7 kg    BMI 38.11 kg/m   Constitutional:  Alert and oriented, No acute distress. HEENT: [place] AT, moist mucus membranes.  Trachea midline, no masses.   Laboratory Data: Lab Results  Component Value Date   WBC 4.5 02/11/2017   HGB 11.7 02/11/2017   HCT 36.0 02/11/2017   MCV 92 02/11/2017   PLT 163 02/11/2017    Lab Results  Component Value Date   CREATININE 1.19 (H) 02/11/2017    No results found for: PSA  No results found for: TESTOSTERONE  No results found for: HGBA1C  Urinalysis    Component Value Date/Time   COLORURINE COLORLESS (A) 07/20/2016 1617   APPEARANCEUR Clear 11/05/2020 1452   LABSPEC 1.002 (L) 07/20/2016 1617   PHURINE 7.0 07/20/2016 1617   GLUCOSEU Negative 11/05/2020 1452   GLUCOSEU NEGATIVE 11/30/2008 1522   HGBUR SMALL (A) 07/20/2016 1617   BILIRUBINUR Negative 11/05/2020 1452   KETONESUR NEGATIVE 07/20/2016 1617   PROTEINUR Trace (A) 11/05/2020 1452   PROTEINUR NEGATIVE 07/20/2016 1617   UROBILINOGEN 1 06/02/2013 1524   NITRITE Negative 11/05/2020 1452   NITRITE NEGATIVE 07/20/2016 1617   LEUKOCYTESUR Negative 11/05/2020 1452    Pertinent Imaging:   Assessment & Plan: Schedule Botox  There are no diagnoses linked to this encounter.  No follow-ups on file.  Reece Packer, MD  [facility] [address] [place], [place] [address] [phone]

## 2021-05-22 DIAGNOSIS — E1129 Type 2 diabetes mellitus with other diabetic kidney complication: Secondary | ICD-10-CM | POA: Insufficient documentation

## 2021-05-22 DIAGNOSIS — R7303 Prediabetes: Secondary | ICD-10-CM | POA: Insufficient documentation

## 2021-05-31 ENCOUNTER — Telehealth: Payer: Self-pay | Admitting: *Deleted

## 2021-05-31 DIAGNOSIS — N3946 Mixed incontinence: Secondary | ICD-10-CM

## 2021-05-31 NOTE — Telephone Encounter (Signed)
Received PA for Botox PA # [id number] 05/31/21-06/01/22 4 visits ? ?Left patient a Vm to review Botox appointments and instructions ?Scheduled appointment for UA/UCX and Botox ? ? ?

## 2021-06-25 ENCOUNTER — Other Ambulatory Visit: Payer: Medicare HMO

## 2021-06-25 DIAGNOSIS — N3946 Mixed incontinence: Secondary | ICD-10-CM

## 2021-06-25 LAB — URINALYSIS, COMPLETE
Bilirubin, UA: NEGATIVE
Glucose, UA: NEGATIVE
Leukocytes,UA: NEGATIVE
Nitrite, UA: NEGATIVE
RBC, UA: NEGATIVE
Specific Gravity, UA: 1.02 (ref 1.005–1.030)
Urobilinogen, Ur: 0.2 mg/dL (ref 0.2–1.0)
pH, UA: 6 (ref 5.0–7.5)

## 2021-06-25 LAB — MICROSCOPIC EXAMINATION: Bacteria, UA: NONE SEEN

## 2021-06-29 LAB — CULTURE, URINE COMPREHENSIVE

## 2021-07-08 ENCOUNTER — Encounter: Payer: Self-pay | Admitting: Urology

## 2021-07-08 ENCOUNTER — Ambulatory Visit: Payer: Medicare HMO | Admitting: Urology

## 2021-07-08 VITALS — BP 150/70 | HR 78 | Ht 61.0 in | Wt 240.0 lb

## 2021-07-08 DIAGNOSIS — N3281 Overactive bladder: Secondary | ICD-10-CM | POA: Diagnosis not present

## 2021-07-08 MED ORDER — LIDOCAINE HCL 2 % IJ SOLN
50.0000 mL | Freq: Once | INTRAMUSCULAR | Status: AC
  Administered 2021-07-08: 1000 mg

## 2021-07-08 MED ORDER — ONABOTULINUMTOXINA 100 UNITS IJ SOLR
100.0000 [IU] | Freq: Once | INTRAMUSCULAR | Status: AC
  Administered 2021-07-08: 100 [IU] via INTRAMUSCULAR

## 2021-07-08 NOTE — Addendum Note (Signed)
Addended by: Milas Kocher A on: 07/08/2021 03:08 PM ? ? Modules accepted: Orders ? ?

## 2021-07-08 NOTE — Progress Notes (Signed)
Bladder Instillation ? ?Patient is present today for a Bladder Instillation of Lidocaine 2% prior to Cysto Bladder Botox instillation procedure. Patient was cleaned and prepped in a sterile fashion with betadine and lidocaine 2% jelly was instilled into the urethra.  A 14FR catheter was inserted, urine return was noted 68ml, urine was yellow in color.  50 ml of Lidocaine 2% was instilled into the bladder. The catheter was then removed. Patient tolerated well, no complications were noted ?Patient held in bladder for 30 minutes prior to procedure starting.  ? ?Performed by: Fonnie Jarvis, CMA ? ? ?

## 2021-07-08 NOTE — Progress Notes (Signed)
? ?07/08/2021 ?1:59 PM  ? ?Cassandra MathRhonda L [name] ?12-May-1955 ?[id number] ? ?Referring provider: Carren Rangarter, Danielle, PA-C ?[address] ?[facility] ?[place],  [place] [id number] ? ?Chief Complaint  ?Patient presents with  ? Botulinum Toxin Injection  ? ? ?HPI: ?I reviewed my lengthy note from May 13, 2021.  She failed percutaneous tibial nerve stimulation for refractory urge incontinence and mixed incontinence.  She had very impressive high-pressure overactivity on urodynamics.  She has a lot of allergies.  Recent culture showed 4000 colonies of Streptococcus and I did not treat it.  I gave her 3 days of Keflex as per protocol ? ?Urgency incontinence persisting.  Frequency stable. ? ?Patient underwent cystoscopy and injection of botulinum toxin.  Cystoscopically bladder mucosa and trigone were normal.  I injected 100 units of Botox in 10 cc in normal saline with my usual up-and-down template with the scope.  At least half the injections were quite tender mainly with the entrance of the needle.  There was no bleeding.  Overall she did well ? ? ?PMH: ?Past Medical History:  ?Diagnosis Date  ? Allergy   ? Anxiety   ? Complication of anesthesia   ? difficulty waking up  ? Depression   ? Dysrhythmia   ? svt  ? GERD (gastroesophageal reflux disease)   ? Heart murmur   ? never seen cardiologist for this  ? Hypertension   ? Leukocytoclastic vasculitis (HCC)   ? Dr. Marchelle Gearingamaswamy and Dr. Corliss Skainseveshwar  ? Migraines   ? maybe one a year  ? Renal disorder   ? Renal insufficiency   ? Sleep apnea   ? cpap  ? SVT (supraventricular tachycardia) (HCC)   ? 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 Verrucae vulgaris   ? Wegner's disease (congenital syphilitic osteochondritis)   ? ? ?Surgical History: ?Past Surgical History:  ?Procedure Laterality Date  ? BUNIONECTOMY Bilateral   ? CARPAL TUNNEL RELEASE Right 2002  ? JOINT REPLACEMENT Right   ? knee  ? LOWER EXTREMITY  VENOGRAPHY Right 07/14/2016  ? Procedure: Lower Extremity Venography;  Surgeon: Maeola HarmanBrandon Christopher Cain, MD;  [facility];  Service: Cardiovascular;  Laterality: Right;  ? PERCUTANEOUS VENOUS THROMBECTOMY,LYSIS WITH INTRAVASCULAR ULTRASOUND (IVUS) Right 07/17/2016  ? Procedure: PERCUTANEOUS VENOUS THROMBECTOMY AND LYSIS WITH INTRAVASCULAR ULTRASOUND (IVUS) of right lower extermity with balloon angioplasty;  Surgeon: Maeola HarmanBrandon Christopher Cain, MD;  [facility];  Service: Vascular;  Laterality: Right;  ? SVT ABLATION N/A 02/20/2017  ? Procedure: SVT ABLATION;  Surgeon: Marinus Mawaylor, Gregg W, MD;  [facility];  Service: Cardiovascular;  Laterality: N/A;  ? TONSILLECTOMY  08/2002  ? TOTAL KNEE ARTHROPLASTY Right   ? TOTAL KNEE ARTHROPLASTY Left 02/21/2014  ? Procedure: LEFT TOTAL KNEE ARTHROPLASTY;  Surgeon: Velna OchsPeter G Dalldorf, MD;  [facility];  Service: Orthopedics;  Laterality: Left;  ? TUBAL LIGATION  1980  ? ULTRASOUND GUIDANCE FOR VASCULAR ACCESS Right 07/17/2016  ? Procedure: ULTRASOUND GUIDANCE FOR VASCULAR ACCESS;  Surgeon: Maeola HarmanBrandon Christopher Cain, MD;  [facility];  Service: Vascular;  Laterality: Right;  ? VENOGRAM Right 07/17/2016  ? Procedure: right ASCENDING VENOGRAM WITH ANGIOJET;  Surgeon: Maeola HarmanBrandon Christopher Cain, MD;  [facility];  Service: Vascular;  Laterality: Right;  ? ? ?Home Medications:  ?Allergies as of 07/08/2021   ? ?   Reactions  ? Nitrofurantoin Itching, Shortness Of Breath  ? Effexor [venlafaxine] Hives  ? Myrbetriq [  mirabegron] Other (See Comments)  ? BACK PAIN ?DRY MOUTH  ? Nsaids Other (See Comments), Hives  ? 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 Reglan [metoclopramide] Other (See Comments)  ? Blister in mouth   ? Clarithromycin   ? UNSPECIFIED REACTION   ? Flagyl [metronidazole]   ? UNSPECIFIED REACTION   ? Ketoprofen   ? Levofloxacin   ? Paxil [paroxetine]   ? UNSPECIFIED REACTION   ? Tolterodine Other (See Comments)  ? Ampicillin Rash  ?  Cholecalciferol Rash  ? Codeine Other (See Comments)  ? Head feels like its crawling  ? Fluoxetine Hcl Itching  ? Deep Itch  ? Sulfa Antibiotics Other (See Comments), Nausea And Vomiting  ? Stomach irritation ?Stomach irritation  ? Tetracycline Nausea And Vomiting  ? Severe stomach pain  ? ?  ? ?  ?Medication List  ?  ? ?  ? Accurate as of July 08, 2021  1:59 PM. If you have any questions, ask your nurse or doctor.  ?  ?  ? ?  ? ?alendronate 70 MG tablet ?Commonly known as: FOSAMAX ?Take 70 mg by mouth once a week. Take with a full glass of water on an empty stomach. ?  ?atorvastatin 20 MG tablet ?Commonly known as: LIPITOR ?Take 20 mg by mouth daily. ?  ?azelastine 0.1 % nasal spray ?Commonly known as: ASTELIN ?Place into the nose. ?  ?cephALEXin 500 MG capsule ?Commonly known as: Keflex ?Take 1 capsule (500 mg total) by mouth 3 (three) times daily. Take 1 day prior to procedure. Take day of procedure and take 1 day after procedure ?  ?CHOLECALCIFEROL PO ?Take 500 Units by mouth daily. ?  ?clobetasol 0.05 % external solution ?Commonly known as: TEMOVATE ?Apply 1 application topically 2 (two) times daily. Apply to itchy areas of scalp ?  ?Dexlansoprazole 30 MG capsule DR ?Take 1 capsule by mouth daily. ?  ?montelukast 10 MG tablet ?Commonly known as: SINGULAIR ?Take by mouth. ?  ?Multi-Vitamin tablet ?Take by mouth daily. ?  ?sertraline 100 MG tablet ?Commonly known as: ZOLOFT ?TAKE 1 TABLET BY MOUTH EVERY NIGHT AT BEDTIME ?  ? ?  ? ? ?Allergies:  ?Allergies  ?Allergen Reactions  ? Nitrofurantoin Itching and Shortness Of Breath  ? Effexor [Venlafaxine] Hives  ? Myrbetriq [Mirabegron] Other (See Comments)  ?  BACK PAIN ?DRY MOUTH  ? Nsaids Other (See Comments) and Hives  ?  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 Reglan [Metoclopramide] Other (See Comments)  ?  Blister in mouth   ? Clarithromycin   ?  UNSPECIFIED REACTION   ? Flagyl [Metronidazole]   ?  UNSPECIFIED REACTION   ? Ketoprofen   ? Levofloxacin   ?  Paxil [Paroxetine]   ?  UNSPECIFIED REACTION   ? Tolterodine Other (See Comments)  ? Ampicillin Rash  ? Cholecalciferol Rash  ? Codeine Other (See Comments)  ?  Head feels like its crawling  ? Fluoxetine Hcl Itching  ?  Deep Itch ?  ? Sulfa Antibiotics Other (See Comments) and Nausea And Vomiting  ?  Stomach irritation ?Stomach irritation  ? Tetracycline Nausea And Vomiting  ?  Severe stomach pain  ? ? ?Family History: ?Family History  ?Problem Relation Age of Onset  ? Clotting disorder Mother 36  ?     Blood clot, foot amputation  ? Cervical cancer Mother   ? Arthritis Sister   ? Heart attack Maternal Grandmother 51  ? Breast cancer Sister 57  ? Asthma Cousin   ? ? ?  Social History:  reports that she has never smoked. She has never used smokeless tobacco. She reports that she does not drink alcohol and does not use drugs. ? ?ROS: ?  ? ?  ? ?  ? ?  ? ?  ? ?  ? ?  ? ?  ? ?  ? ?  ? ?  ? ?  ? ?  ? ?Physical Exam: ?BP (!) 150/70   Pulse 78   Ht 5\' 1"  (1.549 m)   Wt 108.9 kg   BMI 45.35 kg/m?   ? ? ?Laboratory Data: ?Lab Results  ?Component Value Date  ? WBC 4.5 02/11/2017  ? HGB 11.7 02/11/2017  ? HCT 36.0 02/11/2017  ? MCV 92 02/11/2017  ? PLT 163 02/11/2017  ? ? ?Lab Results  ?Component Value Date  ? CREATININE 1.19 (H) 02/11/2017  ? ? ?No results found for: PSA ? ?No results found for: TESTOSTERONE ? ?No results found for: HGBA1C ? ?Urinalysis ?   ?Component Value Date/Time  ? COLORURINE COLORLESS (A) 07/20/2016 1617  ? APPEARANCEUR Clear 06/25/2021 1138  ? LABSPEC 1.002 (L) 07/20/2016 1617  ? PHURINE 7.0 07/20/2016 1617  ? GLUCOSEU Negative 06/25/2021 1138  ? GLUCOSEU NEGATIVE 11/30/2008 1522  ? HGBUR SMALL (A) 07/20/2016 1617  ? BILIRUBINUR Negative 06/25/2021 1138  ? KETONESUR NEGATIVE 07/20/2016 1617  ? PROTEINUR 1+ (A) 06/25/2021 1138  ? PROTEINUR NEGATIVE 07/20/2016 1617  ? UROBILINOGEN 1 06/02/2013 1524  ? NITRITE Negative 06/25/2021 1138  ? NITRITE NEGATIVE 07/20/2016 1617  ? LEUKOCYTESUR Negative  06/25/2021 1138  ? ? ?Pertinent Imaging: ? ? ?Assessment & Plan: Patient will be followed as per protocol.  We agreed if it works well we will try to give her pain medicine 30 minutes prior to the next treatment.  She was very tend

## 2021-07-08 NOTE — Patient Instructions (Signed)
Botulinum Toxin Bladder Injection  A botulinum toxin bladder injection is a procedure to treat an overactive bladder. During the procedure, a drug called botulinum toxin is injected into the bladder through a long, thin needle. This drug relaxes the bladder muscles and reduces overactivity. You may need this procedure if your medicines are not working or you cannot take them. The procedure may be repeated as needed. The treatment is done once and it usually lasts for 6 months. Your health care provider will monitor you to see how well you respond. Tell a health care provider about: Any allergies you have. All medicines you are taking, including vitamins, herbs, eye drops, creams, and over-the-counter medicines. Any problems you or family members have had with anesthetic medicines. Any bleeding problems you have. Any surgeries you have had. Any medical conditions you have. Any previous reactions to a botulinum toxin injection. Any symptoms of urinary tract infection. These include chills, fever, a burning feeling when passing urine, and needing to pass urine often. Whether you are pregnant or may be pregnant. What are the risks? Generally this is a safe procedure. However, problems may occur, including: Not being able to pass urine. If this happens, you may need to have your bladder emptied with a thin tube (urinary catheter). Bleeding. Urinary tract infection. Allergic reaction to the botulinum toxin. Pain or burning when passing urine. Damage to nearby structures or organs. What happens before the procedure? When to stop eating and drinking Follow instructions from your health care provider about what you may eat and drink before your procedure. These may include: 8 hours before the procedure Stop eating most foods. Do not eat meat, fried foods, or fatty foods. Eat only light foods, such as toast or crackers. All liquids are okay except energy drinks and alcohol. 6 hours before the  procedure Stop eating. Drink only clear liquids, such as water, clear fruit juice, black coffee, plain tea, and sports drinks. Do not drink energy drinks or alcohol. 2 hours before the procedure Stop drinking all liquids. You may be allowed to take medicines with small sips of water. If you do not follow your health care provider's instructions, your procedure may be delayed or canceled. Medicines Ask your health care provider about: Changing or stopping your regular medicines. This is especially important if you are taking diabetes medicines or blood thinners. Taking medicines such as aspirin and ibuprofen. These medicines can thin your blood. Do not take these medicines unless your health care provider tells you to take them. Taking over-the-counter medicines, vitamins, herbs, and supplements. General instructions Ask your health care provider what steps will be taken to help prevent infection. These steps may include: Removing hair at the procedure site. Washing skin with a germ-killing soap. Taking antibiotic medicine. If you will be going home right after the procedure, plan to have a responsible adult: Take you home from the hospital or clinic. You will not be allowed to drive. Care for you for the time you are told. What happens during the procedure?  You will be asked to empty your bladder. An IV will be inserted into one of your veins. You will be given one or more of the following: A medicine to help you relax (sedative). A medicine to numb the area (local anesthetic). A medicine to make you fall asleep (general anesthetic). A long, thin scope called a cystoscope will be passed into your bladder through the part of the body that carries urine from your bladder (urethra). The cystoscope   will be used to fill your bladder with water. A long needle will be passed through the cystoscope and into the bladder. The botulinum toxin will be injected into your bladder. It may be  injected into multiple areas of your bladder. The cystoscope will be removed and your bladder will be emptied with a urinary catheter. The procedure may vary among health care providers and hospitals. What can I expect after the procedure? After your procedure, it is common to have: Blood-tinged urine. Burning or soreness when you pass urine. Follow these instructions at home: Medicines Take over-the-counter and prescription medicines only as told by your health care provider. If you were prescribed an antibiotic medicine, take it as told by your health care provider. Do not stop using the antibiotic even if you start to feel better. General instructions  If you were given a sedative during the procedure, it can affect you for several hours. Do not drive or operate machinery until your health care provider says that it is safe. Drink enough fluid to keep your urine pale yellow. Return to your normal activities as told by your health care provider. Ask your health care provider what activities are safe for you. Keep all follow-up visits. Contact a health care provider if you have: A fever or chills. Blood-tinged urine for more than one day after your procedure. Worsening pain or burning when you pass urine. Pain or burning when passing urine for more than two days after your procedure. Trouble emptying your bladder. Get help right away if you: Have bright red blood in your urine. Are unable to pass urine. Summary A botulinum toxin bladder injection is a procedure to treat an overactive bladder. This is generally a safe procedure. However, problems may occur, including not being able to pass urine, bleeding, infection, pain, and an allergic reaction to the botulinum toxin. You will be told when to stop eating and drinking, and what medicines to change or stop. Follow instructions carefully. After the procedure, it is common to have blood in your urine and to have soreness or burning when  passing urine. Contact a health care provider if you have a fever, blood in your urine for more than a few days, or trouble passing urine. Get help right away if you have bright red blood in your urine, or if you are unable to pass urine. This information is not intended to replace advice given to you by your health care provider. Make sure you discuss any questions you have with your health care provider. Document Revised: 09/07/2020 Document Reviewed: 09/07/2020 Elsevier Patient Education  2023 Elsevier Inc.  

## 2021-07-09 LAB — MICROSCOPIC EXAMINATION: Epithelial Cells (non renal): >10 /hpf — AB (ref 0–10)

## 2021-07-09 LAB — URINALYSIS, COMPLETE
Bilirubin, UA: NEGATIVE
Glucose, UA: NEGATIVE
Ketones, UA: NEGATIVE
Leukocytes,UA: NEGATIVE
Nitrite, UA: NEGATIVE
RBC, UA: NEGATIVE
Specific Gravity, UA: 1.02 (ref 1.005–1.030)
Urobilinogen, Ur: 1 mg/dL (ref 0.2–1.0)
pH, UA: 7 (ref 5.0–7.5)

## 2021-07-22 ENCOUNTER — Encounter: Payer: Self-pay | Admitting: Physician Assistant

## 2021-07-22 ENCOUNTER — Ambulatory Visit (INDEPENDENT_AMBULATORY_CARE_PROVIDER_SITE_OTHER): Payer: Medicare HMO | Admitting: Physician Assistant

## 2021-07-22 VITALS — BP 134/70 | HR 74 | Ht 61.0 in | Wt 245.0 lb

## 2021-07-22 DIAGNOSIS — N3281 Overactive bladder: Secondary | ICD-10-CM | POA: Diagnosis not present

## 2021-07-22 LAB — URINALYSIS, COMPLETE
Bilirubin, UA: NEGATIVE
Glucose, UA: NEGATIVE
Ketones, UA: NEGATIVE
Leukocytes,UA: NEGATIVE
Nitrite, UA: NEGATIVE
RBC, UA: NEGATIVE
Specific Gravity, UA: 1.02 (ref 1.005–1.030)
Urobilinogen, Ur: 0.2 mg/dL (ref 0.2–1.0)
pH, UA: 7 (ref 5.0–7.5)

## 2021-07-22 LAB — MICROSCOPIC EXAMINATION: Epithelial Cells (non renal): >10 /hpf — ABNORMAL HIGH (ref 0–10)

## 2021-07-22 LAB — BLADDER SCAN AMB NON-IMAGING: Scan Result: 23

## 2021-07-22 NOTE — Progress Notes (Signed)
? ?07/22/2021 ?2:21 PM  ? ?Cassandra [name] L Retz ?1955-07-27 ?[id number] ? ?CC: ?No chief complaint on file. ? ?HPI: ?Cassandra [name] is a 66 y.o. female with PMH refractory mixed incontinence and pelvic organ prolapse who presents today for PVR and UA 2 weeks after undergoing her first intravesical Botox treatment with Dr. Sherron MondayMacDiarmid.  ? ?Today she reports significant urinary symptomatic improvement after undergoing intravesical Botox.  Namely, her nocturia has resolved and she is now not awakening at night to urinate.  She reports her daytime urgency has also significantly improved.  She is very pleased with her progress and wishes to plan for repeat treatment every 6 months. ? ?In-office UA today positive for 1+ protein; urine microscopy with >10 epithelial cells/hpf.  PVR 23 mL.  ? ?PMH: ?Past Medical History:  ?Diagnosis Date  ? Allergy   ? Anxiety   ? Complication of anesthesia   ? difficulty waking up  ? Depression   ? Dysrhythmia   ? svt  ? GERD (gastroesophageal reflux disease)   ? Heart murmur   ? never seen cardiologist for this  ? Hypertension   ? Leukocytoclastic vasculitis (HCC)   ? Dr. Marchelle Gearingamaswamy and Dr. Corliss Skainseveshwar  ? Migraines   ? maybe one a year  ? Renal disorder   ? Renal insufficiency   ? Sleep apnea   ? cpap  ? SVT (supraventricular tachycardia) (HCC)   ? 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 Verrucae vulgaris   ? Wegner's disease (congenital syphilitic osteochondritis)   ? ? ?Surgical History: ?Past Surgical History:  ?Procedure Laterality Date  ? BUNIONECTOMY Bilateral   ? CARPAL TUNNEL RELEASE Right 2002  ? JOINT REPLACEMENT Right   ? knee  ? LOWER EXTREMITY VENOGRAPHY Right 07/14/2016  ? Procedure: Lower Extremity Venography;  Surgeon: Maeola HarmanBrandon Christopher Cain, MD;  [facility];  Service: Cardiovascular;  Laterality: Right;  ? PERCUTANEOUS VENOUS THROMBECTOMY,LYSIS WITH INTRAVASCULAR ULTRASOUND (IVUS) Right 07/17/2016   ? Procedure: PERCUTANEOUS VENOUS THROMBECTOMY AND LYSIS WITH INTRAVASCULAR ULTRASOUND (IVUS) of right lower extermity with balloon angioplasty;  Surgeon: Maeola HarmanBrandon Christopher Cain, MD;  [facility];  Service: Vascular;  Laterality: Right;  ? SVT ABLATION N/A 02/20/2017  ? Procedure: SVT ABLATION;  Surgeon: Marinus Mawaylor, Gregg W, MD;  [facility];  Service: Cardiovascular;  Laterality: N/A;  ? TONSILLECTOMY  08/2002  ? TOTAL KNEE ARTHROPLASTY Right   ? TOTAL KNEE ARTHROPLASTY Left 02/21/2014  ? Procedure: LEFT TOTAL KNEE ARTHROPLASTY;  Surgeon: Velna OchsPeter G Dalldorf, MD;  Location: [facility] OR;  Service: Orthopedics;  Laterality: Left;  ? TUBAL LIGATION  1980  ? ULTRASOUND GUIDANCE FOR VASCULAR ACCESS Right 07/17/2016  ? Procedure: ULTRASOUND GUIDANCE FOR VASCULAR ACCESS;  Surgeon: Maeola HarmanBrandon Christopher Cain, MD;  [facility];  Service: Vascular;  Laterality: Right;  ? VENOGRAM Right 07/17/2016  ? Procedure: right ASCENDING VENOGRAM WITH ANGIOJET;  Surgeon: Maeola HarmanBrandon Christopher Cain, MD;  [facility];  Service: Vascular;  Laterality: Right;  ? ? ?Home Medications:  ?Allergies as of 07/22/2021   ? ?   Reactions  ? Nitrofurantoin Itching, Shortness Of Breath  ? Effexor [venlafaxine] Hives  ? Myrbetriq [mirabegron] Other (See Comments)  ? BACK PAIN ?DRY MOUTH  ? Nsaids Other (See Comments), Hives  ? 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 Reglan [metoclopramide] Other (See Comments)  ? Blister in mouth   ? Clarithromycin   ? UNSPECIFIED REACTION   ? Flagyl [  metronidazole]   ? UNSPECIFIED REACTION   ? Ketoprofen   ? Levofloxacin   ? Paxil [paroxetine]   ? UNSPECIFIED REACTION   ? Tolterodine Other (See Comments)  ? Ampicillin Rash  ? Cholecalciferol Rash  ? Codeine Other (See Comments)  ? Head feels like its crawling  ? Fluoxetine Hcl Itching  ? Deep Itch  ? Sulfa Antibiotics Other (See Comments), Nausea And Vomiting  ? Stomach irritation ?Stomach irritation  ? Tetracycline Nausea And Vomiting  ? Severe stomach pain   ? ?  ? ?  ?Medication List  ?  ? ?  ? Accurate as of Jul 22, 2021  2:21 PM. If you have any questions, ask your nurse or doctor.  ?  ?  ? ?  ? ?STOP taking these medications   ? ?cephALEXin 500 MG capsule ?Commonly known as: Keflex ?Stopped by: Carman Ching, PA-C ?  ? ?  ? ?TAKE these medications   ? ?alendronate 70 MG tablet ?Commonly known as: FOSAMAX ?Take 70 mg by mouth once a week. Take with a full glass of water on an empty stomach. ?  ?atorvastatin 20 MG tablet ?Commonly known as: LIPITOR ?Take by mouth. ?What changed: Another medication with the same name was removed. Continue taking this medication, and follow the directions you see here. ?Changed by: Carman Ching, PA-C ?  ?azelastine 0.1 % nasal spray ?Commonly known as: ASTELIN ?Place into the nose. ?  ?busPIRone 10 MG tablet ?Commonly known as: BUSPAR ?Take by mouth. ?  ?CHOLECALCIFEROL PO ?Take 500 Units by mouth daily. ?  ?clobetasol 0.05 % external solution ?Commonly known as: TEMOVATE ?Apply 1 application topically 2 (two) times daily. Apply to itchy areas of scalp ?  ?Dexlansoprazole 30 MG capsule DR ?Take 1 capsule by mouth daily. ?  ?montelukast 10 MG tablet ?Commonly known as: SINGULAIR ?Take by mouth. ?What changed: Another medication with the same name was removed. Continue taking this medication, and follow the directions you see here. ?Changed by: Carman Ching, PA-C ?  ?Multi-Vitamin tablet ?Take by mouth daily. ?  ?sertraline 100 MG tablet ?Commonly known as: ZOLOFT ?TAKE 1 TABLET BY MOUTH EVERY NIGHT AT BEDTIME ?  ? ?  ? ? ?Allergies:  ?Allergies  ?Allergen Reactions  ? Nitrofurantoin Itching and Shortness Of Breath  ? Effexor [Venlafaxine] Hives  ? Myrbetriq [Mirabegron] Other (See Comments)  ?  BACK PAIN ?DRY MOUTH  ? Nsaids Other (See Comments) and Hives  ?  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 Reglan [Metoclopramide] Other (See Comments)  ?  Blister in mouth   ? Clarithromycin   ?  UNSPECIFIED REACTION    ? Flagyl [Metronidazole]   ?  UNSPECIFIED REACTION   ? Ketoprofen   ? Levofloxacin   ? Paxil [Paroxetine]   ?  UNSPECIFIED REACTION   ? Tolterodine Other (See Comments)  ? Ampicillin Rash  ? Cholecalciferol Rash  ? Codeine Other (See Comments)  ?  Head feels like its crawling  ? Fluoxetine Hcl Itching  ?  Deep Itch ?  ? Sulfa Antibiotics Other (See Comments) and Nausea And Vomiting  ?  Stomach irritation ?Stomach irritation  ? Tetracycline Nausea And Vomiting  ?  Severe stomach pain  ? ? ?Family History: ?Family History  ?Problem Relation Age of Onset  ? Clotting disorder Mother 92  ?     Blood clot, foot amputation  ? Cervical cancer Mother   ? Arthritis Sister   ? Heart attack Maternal Grandmother 51  ?  Breast cancer Sister 41  ? Asthma Cousin   ? ? ?Social History:  ? reports that she has never smoked. She has never used smokeless tobacco. She reports that she does not drink alcohol and does not use drugs. ? ?Physical Exam: ?BP 134/70   Pulse 74   Ht 5\' 1"  (1.549 m)   Wt 245 lb (111.1 kg)   BMI 46.29 kg/m?   ?Constitutional:  Alert and oriented, no acute distress, nontoxic appearing ?HEENT: [place], AT ?Cardiovascular: No clubbing, cyanosis, or edema ?Respiratory: Normal respiratory effort, no increased work of breathing ?Skin: No rashes, bruises or suspicious lesions ?Neurologic: Grossly intact, no focal deficits, moving all 4 extremities ?Psychiatric: Normal mood and affect ? ?Laboratory Data: ?Results for orders placed or performed in visit on 07/22/21  ?Bladder Scan (Post Void Residual) in office  ?Result Value Ref Range  ? Scan Result 23   ? ?Assessment & Plan:   ?1. OAB (overactive bladder) ?Significant symptomatic improvement after intravesical Botox.  She is emptying appropriately today and no signs of urinary infection.  We will plan to retreat her in 6 months.  Patient is in agreement with this plan. ?- Urinalysis, Complete ?- Bladder Scan (Post Void Residual) in office ? ?Return in about 6 months  (around 01/22/2022) for Botox with Dr. 13/10/2021 with UA and culture prior. ? ?Sherron Monday, PA-C ? ?[facility] ?[address] ?[place], [place] ?[phone]

## 2021-07-23 ENCOUNTER — Telehealth: Payer: Self-pay | Admitting: *Deleted

## 2021-07-23 MED ORDER — CEPHALEXIN 500 MG PO CAPS: ORAL_CAPSULE | ORAL | 0 refills | Status: DC

## 2021-07-23 NOTE — Telephone Encounter (Signed)
Scheduled patient's Botox appointment, reviewed all appointments and lab appointments in detail. Sent in RX to [facility], patient aware to call with any questions.  ?

## 2021-10-01 ENCOUNTER — Encounter: Payer: Self-pay | Admitting: Physician Assistant

## 2021-10-01 ENCOUNTER — Ambulatory Visit: Payer: Medicare HMO | Admitting: Physician Assistant

## 2021-10-01 VITALS — BP 138/70 | HR 78 | Ht 63.0 in | Wt 255.0 lb

## 2021-10-01 DIAGNOSIS — N3001 Acute cystitis with hematuria: Secondary | ICD-10-CM | POA: Diagnosis not present

## 2021-10-01 LAB — MICROSCOPIC EXAMINATION: WBC, UA: >30 /hpf — AB (ref 0–5)

## 2021-10-01 LAB — URINALYSIS, COMPLETE
Bilirubin, UA: NEGATIVE
Glucose, UA: NEGATIVE
Ketones, UA: NEGATIVE
Nitrite, UA: NEGATIVE
Specific Gravity, UA: <1.005 — ABNORMAL LOW (ref 1.005–1.030)
Urobilinogen, Ur: 0.2 mg/dL (ref 0.2–1.0)
pH, UA: 6 (ref 5.0–7.5)

## 2021-10-01 LAB — BLADDER SCAN AMB NON-IMAGING: Scan Result: 9

## 2021-10-01 MED ORDER — CEFUROXIME AXETIL 250 MG PO TABS: 250.0000 mg | ORAL_TABLET | Freq: Two times a day (BID) | ORAL | 0 refills | Status: AC

## 2021-10-01 NOTE — Progress Notes (Signed)
10/01/2021 5:10 PM   [name] [birth year] [id number]  CC: Chief Complaint  Patient presents with   Over Active Bladder   HPI: Cassandra [name] is a 66 y.o. female with PMH refractory mixed incontinence and pelvic organ prolapse who underwent intravesical Botox with Dr. Sherron Monday on 07/08/2021 who presents today for evaluation of worsening urgency and nocturia.   Today she reports an approximate 3-week history of increased urgency, nocturia x3, and mild dysuria.  She is concerned that this may represent worsening of her underlying OAB symptoms.  In-office UA today positive for 2+ blood, 1+ protein, and 2+ leukocyte esterase; urine microscopy with >30 WBCs/HPF, 3-10 RBCs/HPF, and many bacteria. PVR 31mL.  PMH: Past Medical History:  Diagnosis Date   Allergy    Anxiety    Complication of anesthesia    difficulty waking up   Depression    Dysrhythmia    svt   GERD (gastroesophageal reflux disease)    Heart murmur    never seen cardiologist for this   Hypertension    Leukocytoclastic vasculitis (HCC)    Dr. Marchelle Gearing and Dr. 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   Verrucae vulgaris    Wegner's disease (congenital syphilitic osteochondritis)     Surgical History: Past Surgical History:  Procedure Laterality Date   BUNIONECTOMY Bilateral    CARPAL TUNNEL RELEASE Right 2002   JOINT REPLACEMENT Right    knee   LOWER EXTREMITY VENOGRAPHY Right 07/14/2016   Procedure: Lower Extremity Venography;  Surgeon: Maeola Harman, MD;  [facility] CV LAB;  Service: Cardiovascular;  Laterality: Right;   PERCUTANEOUS VENOUS THROMBECTOMY,LYSIS WITH INTRAVASCULAR ULTRASOUND (IVUS) Right 07/17/2016   Procedure: PERCUTANEOUS VENOUS THROMBECTOMY AND  LYSIS WITH INTRAVASCULAR ULTRASOUND (IVUS) of right lower extermity with balloon angioplasty;  Surgeon: Maeola Harman, MD;  [facility] OR;  Service: Vascular;  Laterality: Right;   SVT ABLATION N/A 02/20/2017   Procedure: SVT ABLATION;  Surgeon: Marinus Maw, MD;  [facility]: [facility] INVAS[facility] CV LAB;  Service: Cardiovascular;  Laterality: N/A;   TONSILLECTOMY  08/2002   TOTAL KNEE ARTHROPLASTY Right    TOTAL KNEE ARTHROPLASTY Left 02/21/2014   Procedure: LEFT TOTAL KNEE ARTHROPLASTY;  Surgeon: Velna Ochs, MD;  Location: [facility] OR;  Service: Orthopedics;  Laterality: Left;   TUBAL LIGATION  1980   ULTRASOUND GUIDANCE FOR VASCULAR ACCESS Right 07/17/2016   Procedure: ULTRASOUND GUIDANCE FOR VASCULAR ACCESS;  Surgeon: Maeola Harman, MD;  Location: [facility] OR;  Service: Vascular;  Laterality: Right;   VENOGRAM Right 07/17/2016   Procedure: right ASCENDING VENOGRAM WITH ANGIOJET;  Surgeon: Maeola Harman, MD;  Location: [facility] OR;  Service: Vascular;  Laterality: Right;    Home Medications:  Allergies as of 10/01/2021       Reactions   Nitrofurantoin Itching, Shortness Of Breath   Effexor [venlafaxine] Hives   Myrbetriq [mirabegron] Other (See Comments)   BACK PAIN DRY MOUTH   Nsaids Other (See Comments), Hives   Hypersensitivity vasculitis VASCULITIS   Reglan [metoclopramide] Other (See Comments)   Blister in mouth    Clarithromycin    UNSPECIFIED REACTION    Flagyl [metronidazole]    UNSPECIFIED REACTION    Ketoprofen  Levofloxacin    Paxil [paroxetine]    UNSPECIFIED REACTION    Tolterodine Other (See Comments)   Ampicillin Rash   Cholecalciferol Rash   Codeine Other (See Comments)   Head feels like its crawling   Fluoxetine Hcl Itching   Deep Itch   Sulfa Antibiotics Other (See Comments), Nausea And Vomiting   Stomach irritation Stomach irritation   Tetracycline Nausea And Vomiting   Severe stomach pain        Medication List        Accurate as  of October 01, 2021  5:10 PM. If you have any questions, ask your nurse or doctor.          STOP taking these medications    cephALEXin 500 MG capsule Commonly known as: Keflex Stopped by: Carman Ching, PA-C       TAKE these medications    alendronate 70 MG tablet Commonly known as: FOSAMAX Take 70 mg by mouth once a week. Take with a full glass of water on an empty stomach.   atorvastatin 20 MG tablet Commonly known as: LIPITOR Take by mouth.   azelastine 0.1 % nasal spray Commonly known as: ASTELIN Place into the nose.   busPIRone 10 MG tablet Commonly known as: BUSPAR Take by mouth.   cefUROXime 250 MG tablet Commonly known as: CEFTIN Take 1 tablet (250 mg total) by mouth 2 (two) times daily with a meal for 7 days. Started by: Carman Ching, PA-C   CHOLECALCIFEROL PO Take 500 Units by mouth daily.   clobetasol 0.05 % external solution Commonly known as: TEMOVATE Apply 1 application topically 2 (two) times daily. Apply to itchy areas of scalp   Dexlansoprazole 30 MG capsule DR Take 1 capsule by mouth daily.   montelukast 10 MG tablet Commonly known as: SINGULAIR Take by mouth.   Multi-Vitamin tablet Take by mouth daily.   sertraline 100 MG tablet Commonly known as: ZOLOFT TAKE 1 TABLET BY MOUTH EVERY NIGHT AT BEDTIME        Allergies:  Allergies  Allergen Reactions   Nitrofurantoin Itching and Shortness Of Breath   Effexor [Venlafaxine] Hives   Myrbetriq [Mirabegron] Other (See Comments)    BACK PAIN DRY MOUTH   Nsaids Other (See Comments) and Hives    Hypersensitivity vasculitis VASCULITIS   Reglan [Metoclopramide] Other (See Comments)    Blister in mouth    Clarithromycin     UNSPECIFIED REACTION    Flagyl [Metronidazole]     UNSPECIFIED REACTION    Ketoprofen    Levofloxacin    Paxil [Paroxetine]     UNSPECIFIED REACTION    Tolterodine Other (See Comments)   Ampicillin Rash   Cholecalciferol Rash   Codeine Other  (See Comments)    Head feels like its crawling   Fluoxetine Hcl Itching    Deep Itch    Sulfa Antibiotics Other (See Comments) and Nausea And Vomiting    Stomach irritation Stomach irritation   Tetracycline Nausea And Vomiting    Severe stomach pain    Family History: Family History  Problem Relation Age of Onset   Clotting disorder Mother 44       Blood clot, foot amputation   Cervical cancer Mother    Arthritis Sister    Heart attack Maternal Grandmother 42   Breast cancer Sister 30   Asthma Cousin     Social History:   reports that she has never smoked. She has never used smokeless tobacco. She reports that she  does not drink alcohol and does not use drugs.  Physical Exam: BP 138/70   Pulse 78   Ht 5\' 3"  (1.6 m)   Wt 255 lb (115.7 kg)   BMI 45.17 kg/m   Constitutional:  Alert and oriented, no acute distress, nontoxic appearing HEENT: [place], AT Cardiovascular: No clubbing, cyanosis, or edema Respiratory: Normal respiratory effort, no increased work of breathing Skin: No rashes, bruises or suspicious lesions Neurologic: Grossly intact, no focal deficits, moving all 4 extremities Psychiatric: Normal mood and affect  Laboratory Data: Results for orders placed or performed in visit on 10/01/21  Microscopic Examination   Urine  Result Value Ref Range   WBC, UA >30 (A) 0 - 5 /hpf   RBC, Urine 3-10 (A) 0 - 2 /hpf   Epithelial Cells (non renal) 0-10 0 - 10 /hpf   Bacteria, UA Many (A) None seen/Few  Urinalysis, Complete  Result Value Ref Range   Specific Gravity, UA <1.005 (L) 1.005 - 1.030   pH, UA 6.0 5.0 - 7.5   Color, UA Yellow Yellow   Appearance Ur Hazy (A) Clear   Leukocytes,UA 2+ (A) Negative   Protein,UA 1+ (A) Negative/Trace   Glucose, UA Negative Negative   Ketones, UA Negative Negative   RBC, UA 2+ (A) Negative   Bilirubin, UA Negative Negative   Urobilinogen, Ur 0.2 0.2 - 1.0 mg/dL   Nitrite, UA Negative Negative   Microscopic Examination See  below:   Bladder Scan (Post Void Residual) in office  Result Value Ref Range   Scan Result 9    Assessment & Plan:   1. Acute cystitis with hematuria UA is grossly infected today.  Will start empiric cefuroxime and send for culture for further evaluation.  We discussed that if her urgency does not improve on antibiotics, she should contact me so that we may arrange for repeat intravesical Botox sooner than originally planned.  She is in agreement with this plan. - Urinalysis, Complete - Bladder Scan (Post Void Residual) in office - CULTURE, URINE COMPREHENSIVE - cefUROXime (CEFTIN) 250 MG tablet; Take 1 tablet (250 mg total) by mouth 2 (two) times daily with a meal for 7 days.  Dispense: 14 tablet; Refill: 0  Return if symptoms worsen or fail to improve.  10/03/21, PA-C  [facility] [address] [place], [place] [phone]

## 2021-10-04 LAB — CULTURE, URINE COMPREHENSIVE

## 2021-11-27 DIAGNOSIS — M1812 Unilateral primary osteoarthritis of first carpometacarpal joint, left hand: Secondary | ICD-10-CM | POA: Insufficient documentation

## 2021-12-12 ENCOUNTER — Ambulatory Visit: Payer: Medicare HMO | Admitting: Physician Assistant

## 2021-12-12 VITALS — BP 137/81 | HR 91 | Ht 63.0 in | Wt 239.0 lb

## 2021-12-12 DIAGNOSIS — R350 Frequency of micturition: Secondary | ICD-10-CM | POA: Diagnosis not present

## 2021-12-12 LAB — URINALYSIS, COMPLETE
Bilirubin, UA: NEGATIVE
Glucose, UA: NEGATIVE
Ketones, UA: NEGATIVE
Leukocytes,UA: NEGATIVE
Nitrite, UA: NEGATIVE
RBC, UA: NEGATIVE
Specific Gravity, UA: 1.03 (ref 1.005–1.030)
Urobilinogen, Ur: 0.2 mg/dL (ref 0.2–1.0)
pH, UA: 5.5 (ref 5.0–7.5)

## 2021-12-12 LAB — BLADDER SCAN AMB NON-IMAGING

## 2021-12-12 LAB — MICROSCOPIC EXAMINATION

## 2021-12-12 NOTE — Progress Notes (Signed)
12/12/2021 10:15 AM   Cassandra [name] [birth year] [id number]  CC: Chief Complaint  Patient presents with   Urinary Frequency   HPI: Cassandra [name] is a 66 y.o. female with PMH refractory mixed incontinence and pelvic organ prolapse who underwent intravesical Botox with Dr. Matilde Sprang on 07/08/2021 presents today for evaluation of worsening voiding symptoms.   I saw her in clinic most recently on 10/01/2021, at which point she reported 3 weeks of increased urgency, nocturia, and mild dysuria.  Her UA appeared grossly infected and I treated her with empiric cefuroxime.  Her urine culture finalized with pansensitive E. coli.  Today she reports antibiotics made no improvement in her voiding symptoms and she continues to have bothersome mixed incontinence.  She wonders if this may represent persistent UTI.  She is scheduled to follow-up with Dr. Matilde Sprang in early November for her next intravesical Botox treatment.  In-office UA today positive for 2+ protein; urine microscopy with granular and hyaline casts and moderate bacteria.  PMH: Past Medical History:  Diagnosis Date   Allergy    Anxiety    Complication of anesthesia    difficulty waking up   Depression    Dysrhythmia    svt   GERD (gastroesophageal reflux disease)    Heart murmur    never seen cardiologist for this   Hypertension    Leukocytoclastic vasculitis (HCC)    Dr. Chase Caller and Dr. Estanislado Pandy   Migraines    maybe one a year   Renal disorder    Renal insufficiency    Sleep apnea    cpap   SVT (supraventricular tachycardia) ([place])    Type 2 diabetes mellitus with kidney complication, without long-term current use of insulin ([place]) 05/07/2016   Verrucae vulgaris    Wegner's disease (congenital syphilitic osteochondritis)     Surgical History: Past Surgical History:  Procedure Laterality Date   BUNIONECTOMY Bilateral    CARPAL TUNNEL RELEASE Right 2002   JOINT REPLACEMENT Right    knee   LOWER EXTREMITY  VENOGRAPHY Right 07/14/2016   Procedure: Lower Extremity Venography;  Surgeon: Waynetta Sandy, MD;  Location: [facility];  Service: Cardiovascular;  Laterality: Right;   PERCUTANEOUS VENOUS THROMBECTOMY,LYSIS WITH INTRAVASCULAR ULTRASOUND (IVUS) Right 07/17/2016   Procedure: PERCUTANEOUS VENOUS THROMBECTOMY AND LYSIS WITH INTRAVASCULAR ULTRASOUND (IVUS) of right lower extermity with balloon angioplasty;  Surgeon: Waynetta Sandy, MD;  Location: [facility];  Service: Vascular;  Laterality: Right;   SVT ABLATION N/A 02/20/2017   Procedure: SVT ABLATION;  Surgeon: Evans Lance, MD;  Location: [facility];  Service: Cardiovascular;  Laterality: N/A;   TONSILLECTOMY  08/2002   TOTAL KNEE ARTHROPLASTY Right    TOTAL KNEE ARTHROPLASTY Left 02/21/2014   Procedure: LEFT TOTAL KNEE ARTHROPLASTY;  Surgeon: Hessie Dibble, MD;  Location: [facility];  Service: Orthopedics;  Laterality: Left;   TUBAL LIGATION  1980   ULTRASOUND GUIDANCE FOR VASCULAR ACCESS Right 07/17/2016   Procedure: ULTRASOUND GUIDANCE FOR VASCULAR ACCESS;  Surgeon: Waynetta Sandy, MD;  Location: [facility];  Service: Vascular;  Laterality: Right;   VENOGRAM Right 07/17/2016   Procedure: right ASCENDING VENOGRAM WITH ANGIOJET;  Surgeon: Waynetta Sandy, MD;  Location: [facility];  Service: Vascular;  Laterality: Right;    Home Medications:  Allergies as of 12/12/2021       Reactions   Nitrofurantoin Itching, Shortness Of Breath   Effexor [venlafaxine] Hives   Myrbetriq [mirabegron] Other (See Comments)   BACK  PAIN DRY MOUTH   Nsaids Other (See Comments), Hives   Hypersensitivity vasculitis VASCULITIS   Reglan [metoclopramide] Other (See Comments)   Blister in mouth    Clarithromycin    UNSPECIFIED REACTION    Flagyl [metronidazole]    UNSPECIFIED REACTION    Ketoprofen    Levofloxacin    Paxil [paroxetine]    UNSPECIFIED REACTION    Tolterodine Other (See Comments)   Ampicillin Rash    Cholecalciferol Rash   Codeine Other (See Comments)   Head feels like its crawling   Fluoxetine Hcl Itching   Deep Itch   Sulfa Antibiotics Other (See Comments), Nausea And Vomiting   Stomach irritation Stomach irritation   Tetracycline Nausea And Vomiting   Severe stomach pain        Medication List        Accurate as of December 12, 2021 10:15 AM. If you have any questions, ask your nurse or doctor.          alendronate 70 MG tablet Commonly known as: FOSAMAX Take 70 mg by mouth once a week. Take with a full glass of water on an empty stomach.   atorvastatin 20 MG tablet Commonly known as: LIPITOR Take by mouth.   azelastine 0.1 % nasal spray Commonly known as: ASTELIN Place into the nose.   busPIRone 10 MG tablet Commonly known as: BUSPAR Take by mouth.   CHOLECALCIFEROL PO Take 500 Units by mouth daily.   clobetasol 0.05 % external solution Commonly known as: TEMOVATE Apply 1 application topically 2 (two) times daily. Apply to itchy areas of scalp   Dexlansoprazole 30 MG capsule DR Take 1 capsule by mouth daily.   montelukast 10 MG tablet Commonly known as: SINGULAIR Take by mouth.   Multi-Vitamin tablet Take by mouth daily.   sertraline 100 MG tablet Commonly known as: ZOLOFT TAKE 1 TABLET BY MOUTH EVERY NIGHT AT BEDTIME        Allergies:  Allergies  Allergen Reactions   Nitrofurantoin Itching and Shortness Of Breath   Effexor [Venlafaxine] Hives   Myrbetriq [Mirabegron] Other (See Comments)    BACK PAIN DRY MOUTH   Nsaids Other (See Comments) and Hives    Hypersensitivity vasculitis VASCULITIS   Reglan [Metoclopramide] Other (See Comments)    Blister in mouth    Clarithromycin     UNSPECIFIED REACTION    Flagyl [Metronidazole]     UNSPECIFIED REACTION    Ketoprofen    Levofloxacin    Paxil [Paroxetine]     UNSPECIFIED REACTION    Tolterodine Other (See Comments)   Ampicillin Rash   Cholecalciferol Rash   Codeine Other  (See Comments)    Head feels like its crawling   Fluoxetine Hcl Itching    Deep Itch    Sulfa Antibiotics Other (See Comments) and Nausea And Vomiting    Stomach irritation Stomach irritation   Tetracycline Nausea And Vomiting    Severe stomach pain    Family History: Family History  Problem Relation Age of Onset   Clotting disorder Mother 32       Blood clot, foot amputation   Cervical cancer Mother    Arthritis Sister    Heart attack Maternal Grandmother 41   Breast cancer Sister 30   Asthma Cousin     Social History:   reports that she has never smoked. She has never used smokeless tobacco. She reports that she does not drink alcohol and does not use drugs.  Physical Exam: BP  137/81   Pulse 91   Ht 5\' 3"  (1.6 m)   Wt 239 lb (108.4 kg)   BMI 42.34 kg/m   Constitutional:  Alert and oriented, no acute distress, nontoxic appearing HEENT: [place], AT Cardiovascular: No clubbing, cyanosis, or edema Respiratory: Normal respiratory effort, no increased work of breathing Skin: No rashes, bruises or suspicious lesions Neurologic: Grossly intact, no focal deficits, moving all 4 extremities Psychiatric: Normal mood and affect  Laboratory Data: Results for orders placed or performed in visit on 12/12/21  Microscopic Examination   Urine  Result Value Ref Range   WBC, UA 0-5 0 - 5 /hpf   RBC, Urine 0-2 0 - 2 /hpf   Epithelial Cells (non renal) 0-10 0 - 10 /hpf   Casts Present (A) None seen /lpf   Cast Type Granular casts (A) N/A   Mucus, UA Present (A) Not Estab.   Bacteria, UA Moderate (A) None seen/Few  Urinalysis, Complete  Result Value Ref Range   Specific Gravity, UA 1.030 1.005 - 1.030   pH, UA 5.5 5.0 - 7.5   Color, UA Yellow Yellow   Appearance Ur Clear Clear   Leukocytes,UA Negative Negative   Protein,UA 2+ (A) Negative/Trace   Glucose, UA Negative Negative   Ketones, UA Negative Negative   RBC, UA Negative Negative   Bilirubin, UA Negative Negative    Urobilinogen, Ur 0.2 0.2 - 1.0 mg/dL   Nitrite, UA Negative Negative   Microscopic Examination See below:   Bladder Scan (Post Void Residual) in office  Result Value Ref Range   Scan Result 0ML    Assessment & Plan:   1. Urinary frequency UA is bland today and her symptoms did not improve after recent culture appropriate antibiotics.  We discussed that she is not clinically infected today.  She wishes to keep her upcoming visit with Dr. Matilde Sprang for another Botox treatment.  As she has already failed PTNS, her only remaining third line therapy for OAB would be InterStim.  We will defer to Dr. Matilde Sprang to discuss this with her. - Urinalysis, Complete - Bladder Scan (Post Void Residual) in office  Return if symptoms worsen or fail to improve.  Debroah Loop, PA-C  [facility] [address] [place], [place] [address] [phone]

## 2022-01-01 LAB — COLOGUARD: COLOGUARD: NEGATIVE

## 2022-01-13 ENCOUNTER — Other Ambulatory Visit: Payer: Self-pay | Admitting: *Deleted

## 2022-01-13 ENCOUNTER — Other Ambulatory Visit: Payer: Medicare HMO

## 2022-01-13 DIAGNOSIS — R350 Frequency of micturition: Secondary | ICD-10-CM

## 2022-01-13 LAB — URINALYSIS, COMPLETE
Bilirubin, UA: NEGATIVE
Glucose, UA: NEGATIVE
Leukocytes,UA: NEGATIVE
Nitrite, UA: NEGATIVE
RBC, UA: NEGATIVE
Specific Gravity, UA: 1.02 (ref 1.005–1.030)
Urobilinogen, Ur: 0.2 mg/dL (ref 0.2–1.0)
pH, UA: 6.5 (ref 5.0–7.5)

## 2022-01-13 LAB — MICROSCOPIC EXAMINATION

## 2022-01-16 LAB — CULTURE, URINE COMPREHENSIVE

## 2022-01-27 ENCOUNTER — Encounter: Payer: Self-pay | Admitting: Urology

## 2022-01-27 ENCOUNTER — Ambulatory Visit (INDEPENDENT_AMBULATORY_CARE_PROVIDER_SITE_OTHER): Payer: Medicare HMO | Admitting: Urology

## 2022-01-27 DIAGNOSIS — N3281 Overactive bladder: Secondary | ICD-10-CM | POA: Diagnosis not present

## 2022-01-27 DIAGNOSIS — N3941 Urge incontinence: Secondary | ICD-10-CM

## 2022-01-27 MED ORDER — ONABOTULINUMTOXINA 100 UNITS IJ SOLR
100.0000 [IU] | Freq: Once | INTRAMUSCULAR | Status: AC
  Administered 2022-01-27: 100 [IU] via INTRAMUSCULAR

## 2022-01-27 NOTE — Progress Notes (Signed)
01/27/2022 11:04 AM   Cassandra [name] 08/10/1955 [id number]  Referring provider: Carren Rang, PA-C [address] [place],  [place] [address]  No chief complaint on file.   HPI:  reviewed my lengthy note from May 13, 2021.  She failed percutaneous tibial nerve stimulation for refractory urge incontinence and mixed incontinence.  She had very impressive high-pressure overactivity on urodynamics.  She has a lot of allergies.  Recent culture showed 4000 colonies of Streptococcus and I did not treat it.  I gave her 3 days of Keflex as per protocol   Patient last Botox was July 08, 2021.  We agreed to use pain medication 30 minutes prior due to tenderness on last injection in April.  A few months later she had a bladder infection with positive culture treated.  She has previously failed percutaneous tibial nerve stimulation but has never had InterStim.  Urine culture October was normal  Today Clinically not infected.  Frequency stable.  Incontinence stable. Cystoscopy: Patient underwent flexible cystoscopy.  Bladder close and trigone were normal.  Urine was clear.  I injected 100 units of Botox in the usual template more so in the midline and some laterally.  Once again she was tender with a 10 injections.  1 cc was utilized this time with 10 units.   PMH: Past Medical History:  Diagnosis Date   Allergy    Anxiety    Complication of anesthesia    difficulty waking up   Depression    Dysrhythmia    svt   GERD (gastroesophageal reflux disease)    Heart murmur    never seen cardiologist for this   Hypertension    Leukocytoclastic vasculitis (HCC)    Dr. Marchelle Gearing and Dr. 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   Verrucae vulgaris     Wegner's disease (congenital syphilitic osteochondritis)     Surgical History: Past Surgical History:  Procedure Laterality Date   BUNIONECTOMY Bilateral    CARPAL TUNNEL RELEASE Right 2002   JOINT REPLACEMENT Right    knee   LOWER EXTREMITY VENOGRAPHY Right 07/14/2016   Procedure: Lower Extremity Venography;  Surgeon: Maeola Harman, MD;  Location: [facility];  Service: Cardiovascular;  Laterality: Right;   PERCUTANEOUS VENOUS THROMBECTOMY,LYSIS WITH INTRAVASCULAR ULTRASOUND (IVUS) Right 07/17/2016   Procedure: PERCUTANEOUS VENOUS THROMBECTOMY AND LYSIS WITH INTRAVASCULAR ULTRASOUND (IVUS) of right lower extermity with balloon angioplasty;  Surgeon: Maeola Harman, MD;  Location: [facility];  Service: Vascular;  Laterality: Right;   SVT ABLATION N/A 02/20/2017   Procedure: SVT ABLATION;  Surgeon: Marinus Maw, MD;  Location: [facility];  Service: Cardiovascular;  Laterality: N/A;   TONSILLECTOMY  08/2002   TOTAL KNEE ARTHROPLASTY Right    TOTAL KNEE ARTHROPLASTY Left 02/21/2014   Procedure: LEFT TOTAL KNEE ARTHROPLASTY;  Surgeon: Velna Ochs, MD;  Location: [facility];  Service: Orthopedics;  Laterality: Left;   TUBAL LIGATION  1980   ULTRASOUND GUIDANCE FOR VASCULAR ACCESS Right 07/17/2016   Procedure: ULTRASOUND GUIDANCE FOR VASCULAR ACCESS;  Surgeon: Maeola Harman, MD;  Location: [facility];  Service: Vascular;  Laterality: Right;   VENOGRAM Right 07/17/2016   Procedure: right ASCENDING VENOGRAM WITH  ANGIOJET;  Surgeon: Maeola Harman, MD;  Location: [facility] OR;  Service: Vascular;  Laterality: Right;    Home Medications:  Allergies as of 01/27/2022       Reactions   Nitrofurantoin Itching, Shortness Of Breath   Effexor [venlafaxine] Hives   Myrbetriq [mirabegron] Other (See Comments)   BACK PAIN DRY MOUTH   Nsaids Other (See Comments), Hives   Hypersensitivity vasculitis VASCULITIS   Reglan [metoclopramide] Other (See Comments)    Blister in mouth    Clarithromycin    UNSPECIFIED REACTION    Flagyl [metronidazole]    UNSPECIFIED REACTION    Ketoprofen    Levofloxacin    Paxil [paroxetine]    UNSPECIFIED REACTION    Tolterodine Other (See Comments)   Ampicillin Rash   Cholecalciferol Rash   Codeine Other (See Comments)   Head feels like its crawling   Fluoxetine Hcl Itching   Deep Itch   Sulfa Antibiotics Other (See Comments), Nausea And Vomiting   Stomach irritation Stomach irritation   Tetracycline Nausea And Vomiting   Severe stomach pain        Medication List        Accurate as of January 27, 2022 11:04 AM. If you have any questions, ask your nurse or doctor.          alendronate 70 MG tablet Commonly known as: FOSAMAX Take 70 mg by mouth once a week. Take with a full glass of water on an empty stomach.   atorvastatin 20 MG tablet Commonly known as: LIPITOR Take by mouth.   azelastine 0.1 % nasal spray Commonly known as: ASTELIN Place into the nose.   busPIRone 10 MG tablet Commonly known as: BUSPAR Take by mouth.   CHOLECALCIFEROL PO Take 500 Units by mouth daily.   clobetasol 0.05 % external solution Commonly known as: TEMOVATE Apply 1 application topically 2 (two) times daily. Apply to itchy areas of scalp   Dexlansoprazole 30 MG capsule DR Take 1 capsule by mouth daily.   montelukast 10 MG tablet Commonly known as: SINGULAIR Take by mouth.   Multi-Vitamin tablet Take by mouth daily.   sertraline 100 MG tablet Commonly known as: ZOLOFT TAKE 1 TABLET BY MOUTH EVERY NIGHT AT BEDTIME        Allergies:  Allergies  Allergen Reactions   Nitrofurantoin Itching and Shortness Of Breath   Effexor [Venlafaxine] Hives   Myrbetriq [Mirabegron] Other (See Comments)    BACK PAIN DRY MOUTH   Nsaids Other (See Comments) and Hives    Hypersensitivity vasculitis VASCULITIS   Reglan [Metoclopramide] Other (See Comments)    Blister in mouth    Clarithromycin      UNSPECIFIED REACTION    Flagyl [Metronidazole]     UNSPECIFIED REACTION    Ketoprofen    Levofloxacin    Paxil [Paroxetine]     UNSPECIFIED REACTION    Tolterodine Other (See Comments)   Ampicillin Rash   Cholecalciferol Rash   Codeine Other (See Comments)    Head feels like its crawling   Fluoxetine Hcl Itching    Deep Itch    Sulfa Antibiotics Other (See Comments) and Nausea And Vomiting    Stomach irritation Stomach irritation   Tetracycline Nausea And Vomiting    Severe stomach pain    Family History: Family History  Problem Relation Age of Onset   Clotting disorder Mother 21       Blood clot, foot amputation   Cervical cancer Mother    Arthritis  Sister    Heart attack Maternal Grandmother 64   Breast cancer Sister 22   Asthma Cousin     Social History:  reports that she has never smoked. She has never used smokeless tobacco. She reports that she does not drink alcohol and does not use drugs.  ROS:                                        Physical Exam:   Laboratory Data: Lab Results  Component Value Date   WBC 4.5 02/11/2017   HGB 11.7 02/11/2017   HCT 36.0 02/11/2017   MCV 92 02/11/2017   PLT 163 02/11/2017    Lab Results  Component Value Date   CREATININE 1.19 (H) 02/11/2017    No results found for: "PSA"  No results found for: "TESTOSTERONE"  No results found for: "HGBA1C"  Urinalysis    Component Value Date/Time   COLORURINE COLORLESS (A) 07/20/2016 1617   APPEARANCEUR Clear 01/13/2022 1039   LABSPEC 1.002 (L) 07/20/2016 1617   PHURINE 7.0 07/20/2016 1617   GLUCOSEU Negative 01/13/2022 1039   GLUCOSEU NEGATIVE 11/30/2008 1522   HGBUR SMALL (A) 07/20/2016 1617   BILIRUBINUR Negative 01/13/2022 1039   KETONESUR NEGATIVE 07/20/2016 1617   PROTEINUR 2+ (A) 01/13/2022 1039   PROTEINUR NEGATIVE 07/20/2016 1617   UROBILINOGEN 1 06/02/2013 1524   NITRITE Negative 01/13/2022 1039   NITRITE NEGATIVE 07/20/2016 1617    LEUKOCYTESUR Negative 01/13/2022 1039    Pertinent Imaging:   Assessment & Plan: She will be followed as per protocol.  We will call in pain medicine next time 30 minutes prior.  There are no diagnoses linked to this encounter.  No follow-ups on file.  Martina Sinner, MD  [facility] [address] [place], [place] [address] [phone]

## 2022-02-20 ENCOUNTER — Other Ambulatory Visit: Payer: Self-pay | Admitting: Dermatology

## 2022-02-20 DIAGNOSIS — L219 Seborrheic dermatitis, unspecified: Secondary | ICD-10-CM

## 2022-05-19 ENCOUNTER — Ambulatory Visit (INDEPENDENT_AMBULATORY_CARE_PROVIDER_SITE_OTHER): Payer: 59 | Admitting: Urology

## 2022-05-19 ENCOUNTER — Encounter: Payer: Self-pay | Admitting: Urology

## 2022-05-19 VITALS — BP 113/78 | HR 97 | Ht 63.0 in | Wt 239.0 lb

## 2022-05-19 DIAGNOSIS — N3941 Urge incontinence: Secondary | ICD-10-CM

## 2022-05-19 DIAGNOSIS — R350 Frequency of micturition: Secondary | ICD-10-CM

## 2022-05-19 LAB — MICROSCOPIC EXAMINATION

## 2022-05-19 LAB — URINALYSIS, COMPLETE
Bilirubin, UA: NEGATIVE
Glucose, UA: NEGATIVE
Ketones, UA: NEGATIVE
Leukocytes,UA: NEGATIVE
Nitrite, UA: NEGATIVE
RBC, UA: NEGATIVE
Specific Gravity, UA: 1.02 (ref 1.005–1.030)
Urobilinogen, Ur: 1 mg/dL (ref 0.2–1.0)
pH, UA: 8.5 — ABNORMAL HIGH (ref 5.0–7.5)

## 2022-05-19 NOTE — Progress Notes (Signed)
05/19/2022 2:17 PM   Cassandra [name] Feb 24, 1956 [id number]  Referring provider: Wayland Denis, PA-C [address] [place],  [place] [address]  Chief Complaint  Patient presents with   Follow-up   Over Active Bladder    HPI:  reviewed my lengthy note from May 13, 2021.  She failed percutaneous tibial nerve stimulation for refractory urge incontinence and mixed incontinence.  She had very impressive high-pressure overactivity on urodynamics routine pressures of a 78 cmH2O and 125 cm water.  It was very impressive.  She had no stress incontinence with a Valsalva pressure 100 cm of water.  I do not see risk factors for neurogenic bladder..  She has a lot of allergies.  Recent culture showed 4000 colonies of Streptococcus and I did not treat it.  I gave her 3 days of Keflex as per protocol   Patient last Botox was July 08, 2021.  The first treatment initially worked for short while and then she developed a bladder infection.  We agreed to use pain medication 30 minutes prior due to tenderness on last injection in April.  A few months later she had a bladder infection with positive culture treated.  She has previously failed percutaneous tibial nerve stimulation but has never had InterStim.  Urine culture October was normal   Today Clinically not infected.  Frequency stable.  Incontinence stable. Cystoscopy: Patient underwent flexible cystoscopy.  Bladder close and trigone were normal.  Urine was clear.  I injected 100 units of Botox in the usual template more so in the midline and some laterally.  Once again she was tender with a 10 injections.  1 cc was utilized this time with 10 units.    Today The patient reports that she really was not helped by the Botox treatment in November.  I reminded her of her transient results last time.  Clean not infected today but urine sent for culture.  Urine culture normal in October 2023  She still has urgency incontinence.  No  infection symptoms.  Severe incontinence when she goes from a sitting to standing position soaking 3-4 pads a day.  No cystitis symptoms.  Frequency stable      PMH: Past Medical History:  Diagnosis Date   Allergy    Anxiety    Complication of anesthesia    difficulty waking up   Depression    Dysrhythmia    svt   GERD (gastroesophageal reflux disease)    Heart murmur    never seen cardiologist for this   Hypertension    Leukocytoclastic vasculitis (HCC)    Dr. Chase Caller and Dr. Estanislado Pandy   Migraines    maybe one a year   Renal disorder    Renal insufficiency    Sleep apnea    cpap   SVT (supraventricular tachycardia)    Type 2 diabetes mellitus with kidney complication, without long-term current use of insulin ([place]) 05/07/2016   Verrucae vulgaris    Wegner's disease (congenital syphilitic osteochondritis)     Surgical History: Past Surgical History:  Procedure Laterality Date   BUNIONECTOMY Bilateral    CARPAL TUNNEL RELEASE Right 2002   JOINT REPLACEMENT Right    knee   LOWER EXTREMITY VENOGRAPHY Right 07/14/2016   Procedure: Lower Extremity Venography;  Surgeon: Waynetta Sandy, MD;  Location: [facility];  Service: Cardiovascular;  Laterality: Right;   PERCUTANEOUS VENOUS THROMBECTOMY,LYSIS WITH INTRAVASCULAR ULTRASOUND (IVUS) Right 07/17/2016   Procedure: PERCUTANEOUS VENOUS THROMBECTOMY AND LYSIS WITH  INTRAVASCULAR ULTRASOUND (IVUS) of right lower extermity with balloon angioplasty;  Surgeon: Waynetta Sandy, MD;  Location: [facility];  Service: Vascular;  Laterality: Right;   SVT ABLATION N/A 02/20/2017   Procedure: SVT ABLATION;  Surgeon: Evans Lance, MD;  Location: [facility];  Service: Cardiovascular;  Laterality: N/A;   TONSILLECTOMY  08/2002   TOTAL KNEE ARTHROPLASTY Right    TOTAL KNEE ARTHROPLASTY Left 02/21/2014   Procedure: LEFT TOTAL KNEE ARTHROPLASTY;  Surgeon: Hessie Dibble, MD;  Location: [facility];  Service: Orthopedics;   Laterality: Left;   TUBAL LIGATION  1980   ULTRASOUND GUIDANCE FOR VASCULAR ACCESS Right 07/17/2016   Procedure: ULTRASOUND GUIDANCE FOR VASCULAR ACCESS;  Surgeon: Waynetta Sandy, MD;  Location: [facility];  Service: Vascular;  Laterality: Right;   VENOGRAM Right 07/17/2016   Procedure: right ASCENDING VENOGRAM WITH ANGIOJET;  Surgeon: Waynetta Sandy, MD;  Location: [facility];  Service: Vascular;  Laterality: Right;    Home Medications:  Allergies as of 05/19/2022       Reactions   Nitrofurantoin Itching, Shortness Of Breath   Effexor [venlafaxine] Hives   Myrbetriq [mirabegron] Other (See Comments)   BACK PAIN DRY MOUTH   Nsaids Other (See Comments), Hives   Hypersensitivity vasculitis VASCULITIS   Reglan [metoclopramide] Other (See Comments)   Blister in mouth    Clarithromycin    UNSPECIFIED REACTION    Flagyl [metronidazole]    UNSPECIFIED REACTION    Ketoprofen    Levofloxacin    Paxil [paroxetine]    UNSPECIFIED REACTION    Tolterodine Other (See Comments)   Ampicillin Rash   Cholecalciferol Rash   Codeine Other (See Comments)   Head feels like its crawling   Fluoxetine Hcl Itching   Deep Itch   Sulfa Antibiotics Other (See Comments), Nausea And Vomiting   Stomach irritation Stomach irritation   Tetracycline Nausea And Vomiting   Severe stomach pain        Medication List        Accurate as of May 19, 2022  2:17 PM. If you have any questions, ask your nurse or doctor.          alendronate 70 MG tablet Commonly known as: FOSAMAX Take 70 mg by mouth once a week. Take with a full glass of water on an empty stomach.   atorvastatin 20 MG tablet Commonly known as: LIPITOR Take by mouth.   azelastine 0.1 % nasal spray Commonly known as: ASTELIN Place into the nose.   CHOLECALCIFEROL PO Take 500 Units by mouth daily.   clobetasol 0.05 % external solution Commonly known as: TEMOVATE Apply 1 application topically 2 (two) times daily. Apply to  itchy areas of scalp   Dexlansoprazole 30 MG capsule DR Take 1 capsule by mouth daily.   montelukast 10 MG tablet Commonly known as: SINGULAIR Take by mouth.   Multi-Vitamin tablet Take by mouth daily.   sertraline 100 MG tablet Commonly known as: ZOLOFT TAKE 1 TABLET BY MOUTH EVERY NIGHT AT BEDTIME        Allergies:  Allergies  Allergen Reactions   Nitrofurantoin Itching and Shortness Of Breath   Effexor [Venlafaxine] Hives   Myrbetriq [Mirabegron] Other (See Comments)    BACK PAIN DRY MOUTH   Nsaids Other (See Comments) and Hives    Hypersensitivity vasculitis VASCULITIS   Reglan [Metoclopramide] Other (See Comments)    Blister in mouth    Clarithromycin     UNSPECIFIED REACTION  Flagyl [Metronidazole]     UNSPECIFIED REACTION    Ketoprofen    Levofloxacin    Paxil [Paroxetine]     UNSPECIFIED REACTION    Tolterodine Other (See Comments)   Ampicillin Rash   Cholecalciferol Rash   Codeine Other (See Comments)    Head feels like its crawling   Fluoxetine Hcl Itching    Deep Itch    Sulfa Antibiotics Other (See Comments) and Nausea And Vomiting    Stomach irritation Stomach irritation   Tetracycline Nausea And Vomiting    Severe stomach pain    Family History: Family History  Problem Relation Age of Onset   Clotting disorder Mother 26       Blood clot, foot amputation   Cervical cancer Mother    Arthritis Sister    Heart attack Maternal Grandmother 35   Breast cancer Sister 7   Asthma Cousin     Social History:  reports that she has never smoked. She has never been exposed to tobacco smoke. She has never used smokeless tobacco. She reports that she does not drink alcohol and does not use drugs.  ROS:                                        Physical Exam: BP 113/78   Pulse 97   Ht '5\' 3"'$  (1.6 m)   Wt 108.4 kg   BMI 42.34 kg/m   Constitutional:  Alert and oriented, No acute distress. HEENT: [place] AT, moist mucus  membranes.  Trachea midline, no masses.   Laboratory Data: Lab Results  Component Value Date   WBC 4.5 02/11/2017   HGB 11.7 02/11/2017   HCT 36.0 02/11/2017   MCV 92 02/11/2017   PLT 163 02/11/2017    Lab Results  Component Value Date   CREATININE 1.19 (H) 02/11/2017    No results found for: "PSA"  No results found for: "TESTOSTERONE"  No results found for: "HGBA1C"  Urinalysis    Component Value Date/Time   COLORURINE COLORLESS (A) 07/20/2016 1617   APPEARANCEUR Clear 01/13/2022 1039   LABSPEC 1.002 (L) 07/20/2016 1617   PHURINE 7.0 07/20/2016 1617   GLUCOSEU Negative 01/13/2022 1039   GLUCOSEU NEGATIVE 11/30/2008 1522   HGBUR SMALL (A) 07/20/2016 1617   BILIRUBINUR Negative 01/13/2022 1039   KETONESUR NEGATIVE 07/20/2016 1617   PROTEINUR 2+ (A) 01/13/2022 1039   PROTEINUR NEGATIVE 07/20/2016 1617   UROBILINOGEN 1 06/02/2013 1524   NITRITE Negative 01/13/2022 1039   NITRITE NEGATIVE 07/20/2016 1617   LEUKOCYTESUR Negative 01/13/2022 1039    Pertinent Imaging:   Assessment & Plan: Schedule for peripheral nerve evaluation in [place] and then she can have the implant here if it works.  She is very impressive detrusor overactivity.  Handout given.  Full template discussed.  Proceed accordingly.  No blood thinners  1. Urinary frequency  - Urinalysis, Complete   No follow-ups on file.  Reece Packer, MD  [facility] [address] [place], [place] [address] [phone]

## 2022-05-22 LAB — CULTURE, URINE COMPREHENSIVE

## 2022-06-04 ENCOUNTER — Telehealth: Payer: Self-pay

## 2022-06-04 NOTE — Telephone Encounter (Signed)
Patient called for refill of Ketoconazole shampoo discussed with patient over 1 year please return to the office before any refills

## 2022-06-05 ENCOUNTER — Telehealth: Payer: Self-pay | Admitting: *Deleted

## 2022-06-05 NOTE — Telephone Encounter (Signed)
Pt scheduled for Botox 07/28/2022      Sent for BOTOX verification 06/05/22   100units BOTOX BCBSNC N39.41 mixed incontinence  N32.81 OAB

## 2022-06-06 NOTE — Telephone Encounter (Signed)
Botox verification came back and pt needs prior auth online thru [facility]. Sent 06/06/22.

## 2022-06-17 NOTE — Telephone Encounter (Signed)
Per [facility] Notification or Prior Authorization is not required for the requested services.  You are not required to submit a notification/prior authorization based on the information provided. The number above acknowledges your inquiry and our response. Please write this number down and refer to it for future inquiries. If you still wish to submit your request for review, please select the Continue with Submission button below.  Decision ID #: [id number]  Spoke with patient and advised results, also scheduled her for UA/UCX for Botox Sent in cipro as well  BuaBotox Instructions:  1.Patient will come in for lab appointment 2 weeks before Botox to make sure that you do not have a urinary tract infection (UTI)  2. If infection we will give you antibiotics to prevent UTI. 3. Cipro will be sent to your pharmacy to be taken, the day before the procedure, the day of the procedure and the day after the procedure. 4. Please come 30 minutes early so that we may numb your bladder with a local anesthetic and wait for it to take effect. 5.Please temporarily stop taking antiplatelets (aspirin-like products) at least 3 days before treatment

## 2022-06-17 NOTE — Telephone Encounter (Signed)
Dr. Matilde Sprang, I was going to send the the Cipro for this patient and this warning popped up. She has 17 allergies/contraindications. 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 Consider removing the following QT Prolonging medication(s). For more information, providers can review medications that prolong the QT interval at www.crediblemeds.org (registration required).          Medications     Ordered     Start Stop    06/17/22 0953   ciprofloxacin (CIPRO) 500 MG tablet       References:    [employer] Drug Information   -- --      Last K, Collected: 02/11/2017  9:46 AM = 3.7 mmol/L Last MG, Collected: 10/25/2016 12:15 AM = 2.1 mg/dL  Consider taking these recommended actions after addressing this advisory:   Consider discontinuing: ciprofloxacin (CIPRO) 500 MG tablet Take 1 tablet the day before, 1 tablet the day of and 1 tablet the day after BOTOX, Historical Me

## 2022-06-18 ENCOUNTER — Telehealth: Payer: Self-pay | Admitting: Urology

## 2022-06-18 NOTE — Telephone Encounter (Signed)
I was not aware of this, I just did her prior auth for Botox, Dr. Matilde Sprang should I cancel this? Please advise  Assessment & Plan: Schedule for peripheral nerve evaluation in [place] and then she can have the implant here if it works.  She is very impressive detrusor overactivity.  Handout given.  Full template discussed.  Proceed accordingly.  No blood thinners

## 2022-06-18 NOTE — Telephone Encounter (Signed)
Pt called asking about her procedure at surgery center in [place] to have the electrode put in her back on 4/9.  She is still scheduled here for Botox and a lab prior.  She said she wasn't supposed to be scheduled for Botox because it wasn't working, so MacDiarmid was going to try this.

## 2022-06-24 NOTE — Telephone Encounter (Signed)
Ok I will cancel the botox and change to office visit. I will also remove the cipro.   .left message to have patient return my call.

## 2022-06-24 NOTE — Telephone Encounter (Signed)
Cassandra Martinez, MD  You19 minutes ago (4:18 PM)    I do not think she needs a ciprofloxacin because I do not think she is going to have Botox It looks like she is having a peripheral nerve evaluation in [facility]

## 2022-07-07 ENCOUNTER — Other Ambulatory Visit: Payer: 59

## 2022-07-08 DIAGNOSIS — Z86718 Personal history of other venous thrombosis and embolism: Secondary | ICD-10-CM

## 2022-07-08 DIAGNOSIS — Z86711 Personal history of pulmonary embolism: Secondary | ICD-10-CM | POA: Insufficient documentation

## 2022-07-08 HISTORY — DX: Personal history of other venous thrombosis and embolism: Z86.718

## 2022-07-28 ENCOUNTER — Ambulatory Visit: Payer: Medicare HMO | Admitting: Urology

## 2022-08-12 ENCOUNTER — Ambulatory Visit (INDEPENDENT_AMBULATORY_CARE_PROVIDER_SITE_OTHER): Payer: 59 | Admitting: Dermatology

## 2022-08-12 VITALS — BP 127/74 | HR 73

## 2022-08-12 DIAGNOSIS — L219 Seborrheic dermatitis, unspecified: Secondary | ICD-10-CM

## 2022-08-12 DIAGNOSIS — S80262A Insect bite (nonvenomous), left knee, initial encounter: Secondary | ICD-10-CM | POA: Diagnosis not present

## 2022-08-12 DIAGNOSIS — W57XXXA Bitten or stung by nonvenomous insect and other nonvenomous arthropods, initial encounter: Secondary | ICD-10-CM

## 2022-08-12 MED ORDER — CLOBETASOL PROPIONATE 0.05 % EX SOLN
CUTANEOUS | 11 refills | Status: DC
Start: 2022-08-12 — End: 2023-04-07

## 2022-08-12 MED ORDER — CICLOPIROX 1 % EX SHAM: MEDICATED_SHAMPOO | CUTANEOUS | 11 refills | Status: DC

## 2022-08-12 NOTE — Patient Instructions (Addendum)
Ciclopirox shampoo - massage into scalp every other day and let sit several minutes before rinsing.   Clobetasol solution - Apply a couple drops to itchy areas in scalp as needed. Avoid face, groin, underarms. May also use to tick bite twice a day until improved.   Topical steroids (such as triamcinolone, fluocinolone, fluocinonide, mometasone, clobetasol, halobetasol, betamethasone, hydrocortisone) can cause thinning and lightening of the skin if they are used for too long in the same area. Your physician has selected the right strength medicine for your problem and area affected on the body. Please use your medication only as directed by your physician to prevent side effects.    Due to recent changes in healthcare laws, you may see results of your pathology and/or laboratory studies on MyChart before the doctors have had a chance to review them. We understand that in some cases there may be results that are confusing or concerning to you. Please understand that not all results are received at the same time and often the doctors may need to interpret multiple results in order to provide you with the best plan of care or course of treatment. Therefore, we ask that you please give [place] 2 business days to thoroughly review all your results before contacting the office for clarification. Should we see a critical lab result, you will be contacted sooner.   If You Need Anything After Your Visit  If you have any questions or concerns for your doctor, please call our main line at [phone] and press option 4 to reach your doctor's medical assistant. If no one answers, please leave a voicemail as directed and we will return your call as soon as possible. Messages left after 4 pm will be answered the following business day.   You may also send [place] a message via MyChart. We typically respond to MyChart messages within 1-2 business days.  For prescription refills, please ask your pharmacy to contact our  office. Our fax number is [phone].  If you have an urgent issue when the clinic is closed that cannot wait until the next business day, you can page your doctor at the number below.    Please note that while we do our best to be available for urgent issues outside of office hours, we are not available 24/7.   If you have an urgent issue and are unable to reach [place], you may choose to seek medical care at your doctor's office, retail clinic, urgent care center, or emergency room.  If you have a medical emergency, please immediately call 911 or go to the emergency department.  Pager Numbers  - Dr. Gwen Pounds: [phone]  - Dr. Neale Burly: [phone]  - Dr. Roseanne Reno: [phone]  In the event of inclement weather, please call our main line at [phone] for an update on the status of any delays or closures.  Dermatology Medication Tips: Please keep the boxes that topical medications come in in order to help keep track of the instructions about where and how to use these. Pharmacies typically print the medication instructions only on the boxes and not directly on the medication tubes.   If your medication is too expensive, please contact our office at [phone] option 4 or send [place] a message through MyChart.   We are unable to tell what your co-pay for medications will be in advance as this is different depending on your insurance coverage. However, we may be able to find a substitute medication at lower cost or fill out paperwork to  get insurance to cover a needed medication.   If a prior authorization is required to get your medication covered by your insurance company, please allow [place] 1-2 business days to complete this process.  Drug prices often vary depending on where the prescription is filled and some pharmacies may offer cheaper prices.  The website www.goodrx.com contains coupons for medications through different pharmacies. The prices here do not account for what the cost may  be with help from insurance (it may be cheaper with your insurance), but the website can give you the price if you did not use any insurance.  - You can print the associated coupon and take it with your prescription to the pharmacy.  - You may also stop by our office during regular business hours and pick up a GoodRx coupon card.  - If you need your prescription sent electronically to a different pharmacy, notify our office through [facility] or by phone at [phone] option 4.     Si Usted Necesita Algo Despus de Su Visita  Tambin puede enviarnos un mensaje a travs de Clinical cytogeneticist. Por lo general respondemos a los mensajes de MyChart en el transcurso de 1 a 2 das hbiles.  Para renovar recetas, por favor pida a su farmacia que se ponga en contacto con nuestra oficina. Annie Sable de fax es [place] [phone].  Si tiene un asunto urgente cuando la clnica est cerrada y que no puede esperar hasta el siguiente da hbil, puede llamar/localizar a su doctor(a) al nmero que aparece a continuacin.   Por favor, tenga en cuenta que aunque hacemos todo lo posible para estar disponibles para asuntos urgentes fuera del horario de [place], no estamos disponibles las 24 horas del da, los 7 [address]  [address] de la [place].   Si tiene un problema urgente y no puede comunicarse con nosotros, puede optar por buscar atencin mdica  en el consultorio de su doctor(a), en una clnica privada, en un centro de atencin urgente o en una sala de emergencias.  Si tiene Engineer, drilling, por favor llame inmediatamente al 911 o vaya a la sala de emergencias.  Nmeros de bper  - Dr. Gwen Pounds: [phone]  - Dra. Moye: [phone]  - Dra. Roseanne Reno: [phone]  En caso de inclemencias del [place], por favor llame a Lacy Duverney principal al [phone] para una actualizacin sobre el [place] de cualquier retraso o cierre.  Consejos para la medicacin en dermatologa: Por favor, guarde las cajas en las  que vienen los medicamentos de uso tpico para ayudarle a seguir las instrucciones sobre dnde y cmo usarlos. Las farmacias generalmente imprimen las instrucciones del medicamento slo en las cajas y no directamente en los tubos del [place].   Si su medicamento es muy caro, por favor, pngase en contacto con Rolm Gala llamando al [phone] y presione la opcin 4 o envenos un mensaje a travs de Clinical cytogeneticist.   No podemos decirle cul ser su copago por los medicamentos por adelantado ya que esto es diferente dependiendo de la cobertura de su seguro. Sin embargo, es posible que podamos encontrar un medicamento sustituto a Audiological scientist un formulario para que el seguro cubra el medicamento que se considera necesario.   Si se requiere una autorizacin previa para que su compaa de seguros [place] su medicamento, por favor permtanos de 1 a 2 das hbiles para completar [address]  Los precios de los medicamentos varan con frecuencia dependiendo del Environmental consultant de dnde se surte la receta y Careers adviser pueden  ofrecer precios ms baratos.  El sitio web www.goodrx.com tiene cupones para medicamentos de Health and safety inspector. Los precios aqu no tienen en cuenta lo que podra costar con la ayuda del seguro (puede ser ms barato con su seguro), pero el sitio web puede darle el precio si no utiliz Tourist information centre manager.  - Puede imprimir el cupn correspondiente y llevarlo con su receta a la farmacia.  - Tambin puede pasar por nuestra oficina durante el horario de atencin regular y Education officer, museum una tarjeta de cupones de GoodRx.  - Si necesita que su receta se enve electrnicamente a una farmacia diferente, informe a nuestra oficina a travs de MyChart de [place] o por telfono llamando al [phone] y presione la opcin 4.

## 2022-08-12 NOTE — Progress Notes (Signed)
   Follow-Up Visit   Subjective  Cassandra [name] is a 67 y.o. female who presents for the following: Seborrheic dermatitis of the scalp. She is out and needs refills of ketoconazole shampoo and clobetasol solution. Flared and itchy.   She also has a tick bite on the left popliteal x 2 weeks, not healing, itchy, and sore. She has tried alcohol and is currently using triple antibiotic ointment.   The following portions of the chart were reviewed this encounter and updated as appropriate: medications, allergies, medical history  Review of Systems:  No other skin or systemic complaints except as noted in HPI or Assessment and Plan.  Objective  Well appearing patient in no apparent distress; mood and affect are within normal limits.  A focused examination was performed of the following areas: Face, scalp, left leg  Relevant physical exam findings are noted in the Assessment and Plan.  Left Popliteal Pink edematous papule.    Assessment & Plan   Bug bite without infection, initial encounter Left Popliteal  Tick Bite  Start Clobetasol solution Apply to AA bite BID until improve. Avoid face, groin, axilla. Topical steroids (such as triamcinolone, fluocinolone, fluocinonide, mometasone, clobetasol, halobetasol, betamethasone, hydrocortisone) can cause thinning and lightening of the skin if they are used for too long in the same area. Your physician has selected the right strength medicine for your problem and area affected on the body. Please use your medication only as directed by your physician to prevent side effects.    Observe for redness extending outward. Call office if occurs.  Seborrheic dermatitis  Related Medications clobetasol (TEMOVATE) 0.05 % external solution Apply 1-2 drops once to twice daily to itchy areas of scalp  SEBORRHEIC DERMATITIS Exam: Mild scaling of the scalp.  Chronic and persistent condition with duration or expected duration over one year. Condition  is symptomatic/ bothersome to patient. Not currently at goal.   Seborrheic Dermatitis is a chronic persistent rash characterized by pinkness and scaling most commonly of the mid face but also can occur on the scalp (dandruff), ears; mid chest, mid back and groin.  It tends to be exacerbated by stress and cooler weather.  People who have neurologic disease may experience new onset or exacerbation of existing seborrheic dermatitis.  The condition is not curable but treatable and can be controlled.  Treatment Plan: Start Ciclopirox shampoo Massage into scalp qod and let sit several minutes before rinsing 1 yr Rf.  Restart Clobetasol solution Apply 1-2 drops to AA scalp prn itch    Return in about 1 year (around 08/12/2023) for [facility].  ICherlyn Labella, CMA, am acting as scribe for Willeen Niece, MD .   Documentation: I have reviewed the above documentation for accuracy and completeness, and I agree with the above.  Willeen Niece, MD

## 2022-09-15 ENCOUNTER — Encounter: Payer: Self-pay | Admitting: Cardiovascular Disease

## 2022-09-15 NOTE — Progress Notes (Unsigned)
[name] Date of Birth  04/20/1955       [facility] Office [address]  [address] [place], [place]  [address]   [place], [place]  [address] [phone]     [phone]   Fax  [phone]     Fax [phone]  Problem List: 1. Palpitations 2. Knee pain - needs L knee replacement.   Has had right TKA .  : Previous notes:   Tiffin is a 40 with hx of SVT.  She had a prolonged episode of SVT in April , 2015 requiring a visit to the ED .  She received IV adenosine with resolution of the SVT.  She now takes PRN Diltiazem. She has not had any severe episodes since then .    Non smoker No ETOH, Fhx:   Maternal grandparents died of MI.   07/01/2016:  Amarionna is seen today for a follow up for a recent ER visit for SVT.  Presented to the ER with SVT - HR of 236.   She had tunnel vision and near syncope.   Resolved with IV adenosine  She has felt short of breath since that time .  She was started on Metoprolol 25 mg BID and has been feeling better   June 28, 2019: Problem list 1.  Supraventricular tachycardia 2. Hyperlipidemia 3.  Chronic kidney disease  Casie seen back today for follow-up of her supraventricular tachycardia.  Her SVT was documented in 2015 and is very rapid.  She had tunnel vision with near syncope.  It resolved with adenosine.  She has been on metoprolol 25 mg twice a day but this was stopped for some reason she cannot remember   She denies any severe episodes of tachycardia.  She still has occasional palpitations that sound consistent with premature ventricular contractions.  September 16, 2022 Lajuanna is seen back today for follow up of her SVT She had SVT documented in 2015, with near syncope Treated with IV adenosisne Was treated with metoprolol 25 BID  I last saw her 3 years ago      Current Outpatient Medications on File Prior to Visit  Medication Sig Dispense Refill   alendronate  (FOSAMAX) 70 MG tablet Take 70 mg by mouth once a week. Take with a full glass of water on an empty stomach.     atorvastatin (LIPITOR) 20 MG tablet Take by mouth.     azelastine (ASTELIN) 0.1 % nasal spray Place into the nose.     CHOLECALCIFEROL PO Take 500 Units by mouth daily.     Ciclopirox 1 % shampoo Massage into scalp every other day and let sit several minutes before rinsing. 120 mL 11   clobetasol (TEMOVATE) 0.05 % external solution Apply 1-2 drops once to twice daily to itchy areas of scalp 50 mL 11   Dexlansoprazole 30 MG capsule Take 1 capsule by mouth daily.     montelukast (SINGULAIR) 10 MG tablet Take by mouth.     Multiple Vitamin (MULTI-VITAMIN) tablet Take by mouth daily.     sertraline (ZOLOFT) 100 MG tablet TAKE 1 TABLET BY MOUTH EVERY NIGHT AT BEDTIME 90 tablet 3   No current facility-administered medications on file prior to visit.    Allergies  Allergen Reactions   Nitrofurantoin Itching and Shortness Of Breath   Effexor [Venlafaxine] Hives   Myrbetriq [Mirabegron] Other (See Comments)    BACK PAIN DRY MOUTH  Nsaids Other (See Comments) and Hives    Hypersensitivity vasculitis VASCULITIS   Reglan [Metoclopramide] Other (See Comments)    Blister in mouth    Clarithromycin     UNSPECIFIED REACTION    Flagyl [Metronidazole]     UNSPECIFIED REACTION    Ketoprofen    Levofloxacin    Paxil [Paroxetine]     UNSPECIFIED REACTION    Tolterodine Other (See Comments)   Ampicillin Rash   Cholecalciferol Rash   Codeine Other (See Comments)    Head feels like its crawling   Fluoxetine Hcl Itching    Deep Itch    Sulfa Antibiotics Other (See Comments) and Nausea And Vomiting    Stomach irritation Stomach irritation   Tetracycline Nausea And Vomiting    Severe stomach pain    Past Medical History:  Diagnosis Date   Allergy    Anxiety    Complication of anesthesia    difficulty waking up   Depression    Dysrhythmia    svt   GERD (gastroesophageal  reflux disease)    Heart murmur    never seen cardiologist for this   Hypertension    Leukocytoclastic vasculitis (HCC)    Dr. Marchelle Gearing and Dr. Corliss Skains   Migraines    maybe one a year   Renal disorder    Renal insufficiency    Sleep apnea    cpap   SVT (supraventricular tachycardia)    Type 2 diabetes mellitus with kidney complication, without long-term current use of insulin (HCC) 05/07/2016   Verrucae vulgaris    Wegner's disease (congenital syphilitic osteochondritis)     Past Surgical History:  Procedure Laterality Date   BUNIONECTOMY Bilateral    CARPAL TUNNEL RELEASE Right 2002   JOINT REPLACEMENT Right    knee   LOWER EXTREMITY VENOGRAPHY Right 07/14/2016   Procedure: Lower Extremity Venography;  Surgeon: Maeola Harman, MD;  Location: [facility] INVASIVE CV LAB;  Service: Cardiovascular;  Laterality: Right;   PERCUTANEOUS VENOUS THROMBECTOMY,LYSIS WITH INTRAVASCULAR ULTRASOUND (IVUS) Right 07/17/2016   Procedure: PERCUTANEOUS VENOUS THROMBECTOMY AND LYSIS WITH INTRAVASCULAR ULTRASOUND (IVUS) of right lower extermity with balloon angioplasty;  Surgeon: Maeola Harman, MD;  Location: [facility] OR;  Service: Vascular;  Laterality: Right;   SVT ABLATION N/A 02/20/2017   Procedure: SVT ABLATION;  Surgeon: Marinus Maw, MD;  Location: [facility] INVASIVE CV LAB;  Service: Cardiovascular;  Laterality: N/A;   TONSILLECTOMY  08/2002   TOTAL KNEE ARTHROPLASTY Right    TOTAL KNEE ARTHROPLASTY Left 02/21/2014   Procedure: LEFT TOTAL KNEE ARTHROPLASTY;  Surgeon: Velna Ochs, MD;  Location: [facility] OR;  Service: Orthopedics;  Laterality: Left;   TUBAL LIGATION  1980   ULTRASOUND GUIDANCE FOR VASCULAR ACCESS Right 07/17/2016   Procedure: ULTRASOUND GUIDANCE FOR VASCULAR ACCESS;  Surgeon: Maeola Harman, MD;  Location: [facility] OR;  Service: Vascular;  Laterality: Right;   VENOGRAM Right 07/17/2016   Procedure: right ASCENDING VENOGRAM WITH ANGIOJET;  Surgeon: Maeola Harman, MD;   Location: [facility] OR;  Service: Vascular;  Laterality: Right;    Social History   Tobacco Use  Smoking Status Never   Passive exposure: Never  Smokeless Tobacco Never    Social History   Substance and Sexual Activity  Alcohol Use No    Family History  Problem Relation Age of Onset   Clotting disorder Mother 17       Blood clot, foot amputation   Cervical cancer Mother    Arthritis Sister  Heart attack Maternal Grandmother 51   Breast cancer Sister 30   Asthma Cousin     Reviw of Systems:  Reviewed in the HPI.  All other systems are negative.   Physical Exam: There were no vitals taken for this visit.  No BP recorded.  {Refresh Note OR Click here to enter BP  :1}***    GEN:  Well nourished, well developed in no acute distress HEENT: Normal NECK: No JVD; No carotid bruits LYMPHATICS: No lymphadenopathy CARDIAC: RRR ***, no murmurs, rubs, gallops RESPIRATORY:  Clear to auscultation without rales, wheezing or rhonchi  ABDOMEN: Soft, non-tender, non-distended MUSCULOSKELETAL:  No edema; No deformity  SKIN: Warm and dry NEUROLOGIC:  Alert and oriented x 3    ECG: June 28, 2019: Normal sinus rhythm at 63.  No ST or T wave changes.    1. SVT :     Kristeen Miss, MD  09/15/2022 8:27 PM    [facility] [address] [place],  [address] [place], [place]  [address] Pager [phone] Phone: [phone]; Fax: [phone]

## 2022-09-16 ENCOUNTER — Ambulatory Visit: Payer: 59 | Attending: Cardiovascular Disease | Admitting: Cardiovascular Disease

## 2022-09-16 ENCOUNTER — Encounter: Payer: Self-pay | Admitting: Cardiovascular Disease

## 2022-09-16 VITALS — BP 130/80 | HR 82 | Ht 63.0 in | Wt 234.4 lb

## 2022-09-16 DIAGNOSIS — I7782 Antineutrophilic cytoplasmic antibody (ANCA) vasculitis: Secondary | ICD-10-CM

## 2022-09-16 DIAGNOSIS — I471 Supraventricular tachycardia, unspecified: Secondary | ICD-10-CM | POA: Diagnosis not present

## 2022-09-16 DIAGNOSIS — I350 Nonrheumatic aortic (valve) stenosis: Secondary | ICD-10-CM | POA: Insufficient documentation

## 2022-09-16 NOTE — Patient Instructions (Signed)
Medication Instructions:  Your physician recommends that you continue on your current medications as directed. Please refer to the Current Medication list given to you today. *If you need a refill on your cardiac medications before your next appointment, please call your pharmacy*   Testing/Procedures: Echocardiogram Your physician has requested that you have an echocardiogram. Echocardiography is a painless test that uses sound waves to create images of your heart. It provides your doctor with information about the size and shape of your heart and how well your heart's chambers and valves are working. This procedure takes approximately one hour. There are no restrictions for this procedure. Please do NOT wear cologne, perfume, aftershave, or lotions (deodorant is allowed). Please arrive 15 minutes prior to your appointment time.    Follow-Up: At [facility], you and your health needs are our priority.  As part of our continuing mission to provide you with exceptional heart care, we have created designated Provider Care Teams.  These Care Teams include your primary Cardiologist (physician) and Advanced Practice Providers (APPs -  Physician Assistants and Nurse Practitioners) who all work together to provide you with the care you need, when you need it.  We recommend signing up for the patient portal called "MyChart".  Sign up information is provided on this After Visit Summary.  MyChart is used to connect with patients for Virtual Visits (Telemedicine).  Patients are able to view lab/test results, encounter notes, upcoming appointments, etc.  Non-urgent messages can be sent to your provider as well.   To learn more about what you can do with MyChart, go to [email or web address].    Your next appointment:   6 month(s)  Provider:   Tereso Newcomer, PA-C

## 2022-10-14 ENCOUNTER — Ambulatory Visit (HOSPITAL_COMMUNITY): Payer: 59

## 2022-10-21 ENCOUNTER — Telehealth: Payer: Self-pay

## 2022-10-21 MED ORDER — KETOCONAZOLE 2 % EX SHAM: 1.0000 | MEDICATED_SHAMPOO | CUTANEOUS | 5 refills | Status: DC

## 2022-10-21 NOTE — Telephone Encounter (Signed)
Patient called to let you know that the Ciclopirox Shampoo is not improving area. She would like a RF of her Ketoconazole Shampoo. Okay to send in?

## 2022-10-21 NOTE — Telephone Encounter (Signed)
RX sent in. aw

## 2022-10-31 ENCOUNTER — Ambulatory Visit (HOSPITAL_COMMUNITY): Payer: 59 | Attending: Cardiovascular Disease

## 2022-10-31 DIAGNOSIS — I471 Supraventricular tachycardia, unspecified: Secondary | ICD-10-CM | POA: Diagnosis not present

## 2022-10-31 DIAGNOSIS — I350 Nonrheumatic aortic (valve) stenosis: Secondary | ICD-10-CM | POA: Insufficient documentation

## 2022-10-31 LAB — ECHOCARDIOGRAM COMPLETE
AR max vel: 0.74 cm2
AV Area VTI: 0.73 cm2
AV Area mean vel: 0.81 cm2
AV Mean grad: 31 mmHg
AV Peak grad: 57.8 mmHg
Ao pk vel: 3.8 m/s
Area-P 1/2: 3.5 cm2
P 1/2 time: 381 msec
S' Lateral: 2.55 cm

## 2022-11-03 ENCOUNTER — Telehealth: Payer: Self-pay

## 2022-11-03 DIAGNOSIS — I351 Nonrheumatic aortic (valve) insufficiency: Secondary | ICD-10-CM

## 2022-11-03 DIAGNOSIS — I359 Nonrheumatic aortic valve disorder, unspecified: Secondary | ICD-10-CM

## 2022-11-03 DIAGNOSIS — I340 Nonrheumatic mitral (valve) insufficiency: Secondary | ICD-10-CM

## 2022-11-03 NOTE — Telephone Encounter (Signed)
-----   Message from Kristeen Miss sent at 11/02/2022  4:02 PM EDT ----- Normal LV systolic function with EF 55-60%.   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 Mean AV gradient measures 31 mm Hg.  Please refer to the TAVR team for further evaluation

## 2022-11-03 NOTE — Telephone Encounter (Signed)
Detailed message left for patient per DPR letting her know referral to structural team has been placed and she will be called to schedule.

## 2022-11-12 ENCOUNTER — Ambulatory Visit: Payer: 59 | Admitting: Cardiovascular Disease

## 2022-11-12 ENCOUNTER — Encounter: Payer: Self-pay | Admitting: Cardiovascular Disease

## 2022-11-12 VITALS — BP 112/88 | HR 68 | Ht 63.0 in | Wt 233.0 lb

## 2022-11-12 DIAGNOSIS — I350 Nonrheumatic aortic (valve) stenosis: Secondary | ICD-10-CM | POA: Diagnosis not present

## 2022-11-12 NOTE — Patient Instructions (Signed)
Medication Instructions:  Your physician recommends that you continue on your current medications as directed. Please refer to the Current Medication list given to you today.  *If you need a refill on your cardiac medications before your next appointment, please call your pharmacy*  Lab Work: NONE If you have labs (blood work) drawn today and your tests are completely normal, you will receive your results only by: MyChart Message (if you have MyChart) OR A paper copy in the mail If you have any lab test that is abnormal or we need to change your treatment, we will call you to review the results.  Testing/Procedures: ECHO (prior to visit with Dr Excell Seltzer in one year) Your physician has requested that you have an echocardiogram. Echocardiography is a painless test that uses sound waves to create images of your heart. It provides your doctor with information about the size and shape of your heart and how well your heart's chambers and valves are working. This procedure takes approximately one hour. There are no restrictions for this procedure. Please do NOT wear cologne, perfume, aftershave, or lotions (deodorant is allowed). Please arrive 15 minutes prior to your appointment time.  Follow-Up: At [facility], you and your health needs are our priority.  As part of our continuing mission to provide you with exceptional heart care, we have created designated Provider Care Teams.  These Care Teams include your primary Cardiologist (physician) and Advanced Practice Providers (APPs -  Physician Assistants and Nurse Practitioners) who all work together to provide you with the care you need, when you need it.   Your next appointment:   6 months with Dr Elease Hashimoto, then 1 year with Dr. Excell Seltzer (ECHO prior)

## 2022-11-12 NOTE — Progress Notes (Signed)
Pre Surgical Assessment: 5 M Walk Test  73M=16.39ft  5 Meter Walk Test- trial 1: 5.51 seconds 5 Meter Walk Test- trial 2: 5.57 seconds 5 Meter Walk Test- trial 3: 5.36 seconds 5 Meter Walk Test Average: 5.48 seconds

## 2022-11-12 NOTE — Progress Notes (Signed)
Cardiology Office Note:    Date:  11/12/2022   ID:  Cassandra [name], DOB 1955/09/23, MRN [id number]  PCP:  Carren Rang, PA-C   [facility] Providers Cardiologist:  Kristeen Miss, MD     Referring MD: Carren Rang, PA-C   Chief Complaint  Patient presents with   Aortic Stenosis    History of Present Illness:    Cassandra [name] is a 67 y.o. female referred by Dr Elease Hashimoto for evaluation of aortic stenosis.  The patient was recently evaluated and was noted to have a murmur of aortic stenosis.  An echocardiogram was performed and showed normal LVEF of 55 to 60% with no regional wall motion abnormalities, grade 1 diastolic dysfunction, normal RV size and systolic function, and findings consistent with severe paradoxical low-flow low gradient aortic stenosis.  The patient's mean aortic valve gradient is 31 mmHg, peak gradient 58 mmHg, peak systolic velocity of 3.8 m/s, dimensionless index of 0.21, and stroke-volume index of 32.  The patient had a previous echo study in 2018 and this demonstrated no evidence of aortic stenosis or aortic valve disease.  The patient is here alone today.  She reports mild dyspnea with exertion but no significant change over time.  She is limited by knee problems as well as balance problems.  She has recently started an exercise program where she does sitting exercises and uses her arms.  She has had mild shortness of breath with this but as she has continued to do it her endurance is built up.  She denies chest pain or pressure, orthopnea, PND, or leg edema.  The patient has no lightheadedness or syncope.  She has no history of rheumatic heart disease.  There is no family history of valvular disease.  Past Medical History:  Diagnosis Date   Allergy    Anxiety    Complication of anesthesia    difficulty waking up   Depression    Dysrhythmia    svt   GERD (gastroesophageal reflux disease)    Heart murmur    never seen cardiologist for this    Hypertension    Leukocytoclastic vasculitis (HCC)    Dr. Marchelle Gearing and Dr. Corliss Skains   Migraines    maybe one a year   Renal disorder    Renal insufficiency    Sleep apnea    cpap   SVT (supraventricular tachycardia)    Type 2 diabetes mellitus with kidney complication, without long-term current use of insulin (HCC) 05/07/2016   Verrucae vulgaris    Wegner's disease (congenital syphilitic osteochondritis)     Past Surgical History:  Procedure Laterality Date   BUNIONECTOMY Bilateral    CARPAL TUNNEL RELEASE Right 2002   JOINT REPLACEMENT Right    knee   LOWER EXTREMITY VENOGRAPHY Right 07/14/2016   Procedure: Lower Extremity Venography;  Surgeon: Maeola Harman, MD;  Location: [facility] INVASIVE CV LAB;  Service: Cardiovascular;  Laterality: Right;   PERCUTANEOUS VENOUS THROMBECTOMY,LYSIS WITH INTRAVASCULAR ULTRASOUND (IVUS) Right 07/17/2016   Procedure: PERCUTANEOUS VENOUS THROMBECTOMY AND LYSIS WITH INTRAVASCULAR ULTRASOUND (IVUS) of right lower extermity with balloon angioplasty;  Surgeon: Maeola Harman, MD;  Location: [facility] OR;  Service: Vascular;  Laterality: Right;   SVT ABLATION N/A 02/20/2017   Procedure: SVT ABLATION;  Surgeon: Marinus Maw, MD;  Location: [facility] INVASIVE CV LAB;  Service: Cardiovascular;  Laterality: N/A;   TONSILLECTOMY  08/2002   TOTAL KNEE ARTHROPLASTY Right    TOTAL KNEE ARTHROPLASTY Left 02/21/2014   Procedure: LEFT  TOTAL KNEE ARTHROPLASTY;  Surgeon: Velna Ochs, MD;  Location: [facility] OR;  Service: Orthopedics;  Laterality: Left;   TUBAL LIGATION  1980   ULTRASOUND GUIDANCE FOR VASCULAR ACCESS Right 07/17/2016   Procedure: ULTRASOUND GUIDANCE FOR VASCULAR ACCESS;  Surgeon: Maeola Harman, MD;  Location: [facility] OR;  Service: Vascular;  Laterality: Right;   VENOGRAM Right 07/17/2016   Procedure: right ASCENDING VENOGRAM WITH ANGIOJET;  Surgeon: Maeola Harman, MD;  Location: [facility] OR;  Service: Vascular;  Laterality: Right;    Current  Medications: Current Meds  Medication Sig   alendronate (FOSAMAX) 70 MG tablet Take 70 mg by mouth once a week. Take with a full glass of water on an empty stomach.   atorvastatin (LIPITOR) 20 MG tablet Take by mouth.   azelastine (ASTELIN) 0.1 % nasal spray Place into the nose.   CHOLECALCIFEROL PO Take 500 Units by mouth daily.   clobetasol (TEMOVATE) 0.05 % external solution Apply 1-2 drops once to twice daily to itchy areas of scalp   Dexlansoprazole 30 MG capsule Take 1 capsule by mouth daily.   ketoconazole (NIZORAL) 2 % shampoo Apply 1 Application topically as directed. Apply 1 application topically once for 1 dose. apply three times per week, massage into scalp and leave in for 5 minutes before rinsing out   montelukast (SINGULAIR) 10 MG tablet Take by mouth.   Multiple Vitamin (MULTI-VITAMIN) tablet Take by mouth daily.   sertraline (ZOLOFT) 100 MG tablet TAKE 1 TABLET BY MOUTH EVERY NIGHT AT BEDTIME     Allergies:   Nitrofurantoin, Effexor [venlafaxine], Myrbetriq [mirabegron], Nsaids, Reglan [metoclopramide], Clarithromycin, Flagyl [metronidazole], Ketoprofen, Levofloxacin, Paxil [paroxetine], Tolterodine, Ampicillin, Cholecalciferol, Codeine, Fluoxetine hcl, Sulfa antibiotics, and Tetracycline   Social History   Socioeconomic History   Marital status: Divorced    Spouse name: Not on file   Number of children: Not on file   Years of education: Not on file   Highest education level: Not on file  Occupational History   Occupation: Unemployed  Tobacco Use   Smoking status: Never    Passive exposure: Never   Smokeless tobacco: Never  Substance and Sexual Activity   Alcohol use: No   Drug use: No   Sexual activity: Not on file  Other Topics Concern   Not on file  Social History Narrative   Not on file   Social Determinants of Health   Financial Resource Strain: Medium Risk (12/19/2021)   Received from [facility], [facility]    Overall Financial Resource Strain (CARDIA)    Difficulty of Paying Living Expenses: Somewhat hard  Food Insecurity: No Food Insecurity (12/19/2021)   Received from [facility], [facility]   Hunger Vital Sign    Worried About Running Out of Food in the Last Year: Never true    Ran Out of Food in the Last Year: Never true  Transportation Needs: No Transportation Needs (12/19/2021)   Received from [facility], [facility]   [facility] - Transportation    In the past 12 months, has lack of transportation kept you from medical appointments or from getting medications?: No    Lack of Transportation (Non-Medical): No  Physical Activity: Not on file  Stress: Not on file  Social Connections: Not on file     Family History: The patient's family history includes Arthritis in her sister; Asthma in her cousin; Breast cancer (age of onset: 83) in her sister;  Cervical cancer in her mother; Clotting disorder (age of onset: 51) in her mother; Heart attack (age of onset: 62) in her maternal grandmother.  ROS:   Please see the history of present illness.    All other systems reviewed and are negative.  EKGs/Labs/Other Studies Reviewed:    The following studies were reviewed today: Echo 10/31/2022: Left Ventricle: Left ventricular ejection fraction, by estimation, is 55  to 60%. The left ventricle has normal function. The left ventricle has no  regional wall motion abnormalities. The average left ventricular global  longitudinal strain is -18.7 %.  The global longitudinal strain is normal. The left ventricular internal  cavity size was normal in size. There is no left ventricular hypertrophy.  Left ventricular diastolic parameters are consistent with Grade I  diastolic dysfunction (impaired  relaxation).   Right Ventricle: The right ventricular size is normal. No increase in  right ventricular wall thickness. Right ventricular  systolic function is  normal. There is normal pulmonary artery systolic pressure. The tricuspid  regurgitant velocity is 2.68 m/s, and   with an assumed right atrial pressure of 3 mmHg, the estimated right  ventricular systolic pressure is 31.7 mmHg.   Left Atrium: Left atrial size was mildly dilated.   Right Atrium: Right atrial size was normal in size.   Pericardium: There is no evidence of pericardial effusion.   Mitral Valve: The mitral valve is normal in structure. Trivial mitral  valve regurgitation. No evidence of mitral valve stenosis.   Tricuspid Valve: The tricuspid valve is normal in structure. Tricuspid  valve regurgitation is mild.   Aortic Valve: The aortic valve is tricuspid. There is severe calcifcation  of the aortic valve. Aortic valve regurgitation is trivial. Aortic  regurgitation PHT measures 381 msec. Severe aortic stenosis is present.  Aortic valve mean gradient measures 31.0  mmHg. Aortic valve peak gradient measures 57.8 mmHg. Aortic valve area, by  VTI measures 0.73 cm.   Pulmonic Valve: The pulmonic valve was normal in structure. Pulmonic valve  regurgitation is trivial.   Aorta: The aortic root is normal in size and structure.   Venous: The inferior vena cava is normal in size with greater than 50%  respiratory variability, suggesting right atrial pressure of 3 mmHg.   IAS/Shunts: No atrial level shunt detected by color flow Doppler.        Recent Labs: No results found for requested labs within last 365 days.  Recent Lipid Panel No results found for: "CHOL", "TRIG", "HDL", "CHOLHDL", "VLDL", "LDLCALC", "LDLDIRECT"                   Physical Exam:    VS:  BP 112/88 (BP Location: Right Arm, Patient Position: Sitting, Cuff Size: Large)   Pulse 68   Ht 5\' 3"  (1.6 m)   Wt 233 lb (105.7 kg)   SpO2 93%   BMI 41.27 kg/m     Wt Readings from Last 3 Encounters:  11/12/22 233 lb (105.7 kg)  09/16/22 234 lb 6.4 oz (106.3 kg)  05/19/22  239 lb (108.4 kg)     GEN:  Well nourished, well developed obese woman in no acute distress HEENT: Normal NECK: No JVD; No carotid bruits LYMPHATICS: No lymphadenopathy CARDIAC: Regular rate and rhythm with a 3/6 crescendo decrescendo murmur, A2 is present but diminished RESPIRATORY:  Clear to auscultation without rales, wheezing or rhonchi  ABDOMEN: Soft, non-tender, non-distended MUSCULOSKELETAL:  No edema; No deformity  SKIN: Warm and dry NEUROLOGIC:  Alert  and oriented x 3 PSYCHIATRIC:  Normal affect   ASSESSMENT:    1. Nonrheumatic aortic (valve) stenosis    PLAN:    In order of problems listed above:  The patient has moderately severe paradoxical low-flow low gradient aortic stenosis.  This is associated with mild New York Heart Association functional class I-II symptoms of exertional dyspnea which appear to be stable over time.  I reviewed the natural history of paradoxical low-flow low gradient aortic stenosis with the patient today.  We discussed potential treatment options.  Her echo is personally reviewed and shows normal LVEF and Doppler findings outlined above which are consistent with moderate to severe aortic stenosis.  On 2D imaging, her valve opening appears moderately narrowed.  Her Doppler findings are more consistent with severe paradoxical low-flow low gradient aortic stenosis.  With her mild symptoms, she really is in favor of deferring intervention at this time.  She would benefit from weight loss as any intervention will be associated with higher risk with a BMI greater than 40.  She will work on diet and exercise and we discussed this.  I asked her to avoid strenuous exercise but advised that moderate level exercise is acceptable.  She understands that if her symptoms progress, she will require intervention sooner.  Again she prefers to defer if possible and we had a shared decision-making conversation about management of her aortic stenosis today.  I would like to set  her up with a 26-month follow-up with Dr. Elease Hashimoto and I will plan to see her back in 1 year with a repeat echocardiogram prior to her visit.  We discussed the cardinal signs and symptoms of severe aortic stenosis today and she will keep a close eye out for any progressive shortness of breath, chest discomfort, lightheadedness, or other potential symptoms of aortic stenosis.           Medication Adjustments/Labs and Tests Ordered: Current medicines are reviewed at length with the patient today.  Concerns regarding medicines are outlined above.  Orders Placed This Encounter  Procedures   EKG 12-Lead   ECHOCARDIOGRAM COMPLETE   No orders of the defined types were placed in this encounter.   Patient Instructions  Medication Instructions:  Your physician recommends that you continue on your current medications as directed. Please refer to the Current Medication list given to you today.  *If you need a refill on your cardiac medications before your next appointment, please call your pharmacy*  Lab Work: NONE If you have labs (blood work) drawn today and your tests are completely normal, you will receive your results only by: MyChart Message (if you have MyChart) OR A paper copy in the mail If you have any lab test that is abnormal or we need to change your treatment, we will call you to review the results.  Testing/Procedures: ECHO (prior to visit with Dr Excell Seltzer in one year) Your physician has requested that you have an echocardiogram. Echocardiography is a painless test that uses sound waves to create images of your heart. It provides your doctor with information about the size and shape of your heart and how well your heart's chambers and valves are working. This procedure takes approximately one hour. There are no restrictions for this procedure. Please do NOT wear cologne, perfume, aftershave, or lotions (deodorant is allowed). Please arrive 15 minutes prior to your appointment  time.  Follow-Up: At [facility], you and your health needs are our priority.  As part of our continuing mission  to provide you with exceptional heart care, we have created designated Provider Care Teams.  These Care Teams include your primary Cardiologist (physician) and Advanced Practice Providers (APPs -  Physician Assistants and Nurse Practitioners) who all work together to provide you with the care you need, when you need it.   Your next appointment:   6 months with Dr Elease Hashimoto, then 1 year with Dr. Excell Seltzer (ECHO prior)    Signed, Tonny Bollman, MD  11/12/2022 5:16 PM    [facility]

## 2022-11-14 ENCOUNTER — Telehealth: Payer: Self-pay | Admitting: Cardiovascular Disease

## 2022-11-14 NOTE — Telephone Encounter (Signed)
Patient called stating she is starting research and she wants to make sure she can take the medication.  She does not know the name of the medication.

## 2022-11-19 NOTE — Telephone Encounter (Signed)
Left additional message for pt.

## 2022-11-19 NOTE — Telephone Encounter (Signed)
Attempted to reach patient for more details. No answer, left voicemail asking that she call back.

## 2022-11-19 NOTE — Telephone Encounter (Signed)
Patient returned RN Sarah's call.

## 2022-11-19 NOTE — Telephone Encounter (Signed)
Spoke with patient who states she was wanting to sign up for research for heart and kidney disease medication through [employer]" and she was wanting to know if this was safe for her. Explained to her that I need to know the name of the drug or the name of the clinical trial to advise. She verbalized understanding.

## 2022-12-05 DIAGNOSIS — Z1231 Encounter for screening mammogram for malignant neoplasm of breast: Secondary | ICD-10-CM | POA: Diagnosis not present

## 2022-12-17 DIAGNOSIS — E119 Type 2 diabetes mellitus without complications: Secondary | ICD-10-CM | POA: Diagnosis not present

## 2022-12-17 DIAGNOSIS — H2513 Age-related nuclear cataract, bilateral: Secondary | ICD-10-CM | POA: Diagnosis not present

## 2022-12-22 DIAGNOSIS — N2581 Secondary hyperparathyroidism of renal origin: Secondary | ICD-10-CM | POA: Diagnosis not present

## 2022-12-22 DIAGNOSIS — I7782 Antineutrophilic cytoplasmic antibody (ANCA) vasculitis: Secondary | ICD-10-CM | POA: Diagnosis not present

## 2022-12-22 DIAGNOSIS — F419 Anxiety disorder, unspecified: Secondary | ICD-10-CM | POA: Diagnosis not present

## 2022-12-22 DIAGNOSIS — E1122 Type 2 diabetes mellitus with diabetic chronic kidney disease: Secondary | ICD-10-CM | POA: Diagnosis not present

## 2022-12-22 DIAGNOSIS — Z0000 Encounter for general adult medical examination without abnormal findings: Secondary | ICD-10-CM | POA: Diagnosis not present

## 2022-12-22 DIAGNOSIS — E782 Mixed hyperlipidemia: Secondary | ICD-10-CM | POA: Diagnosis not present

## 2022-12-22 DIAGNOSIS — I350 Nonrheumatic aortic (valve) stenosis: Secondary | ICD-10-CM | POA: Diagnosis not present

## 2022-12-22 DIAGNOSIS — I471 Supraventricular tachycardia, unspecified: Secondary | ICD-10-CM | POA: Diagnosis not present

## 2022-12-22 DIAGNOSIS — I129 Hypertensive chronic kidney disease with stage 1 through stage 4 chronic kidney disease, or unspecified chronic kidney disease: Secondary | ICD-10-CM | POA: Diagnosis not present

## 2022-12-22 DIAGNOSIS — F32A Depression, unspecified: Secondary | ICD-10-CM | POA: Diagnosis not present

## 2022-12-22 DIAGNOSIS — N1831 Chronic kidney disease, stage 3a: Secondary | ICD-10-CM | POA: Diagnosis not present

## 2022-12-29 DIAGNOSIS — E782 Mixed hyperlipidemia: Secondary | ICD-10-CM | POA: Diagnosis not present

## 2022-12-29 DIAGNOSIS — R809 Proteinuria, unspecified: Secondary | ICD-10-CM | POA: Diagnosis not present

## 2022-12-29 DIAGNOSIS — E1129 Type 2 diabetes mellitus with other diabetic kidney complication: Secondary | ICD-10-CM | POA: Diagnosis not present

## 2022-12-29 DIAGNOSIS — I10 Essential (primary) hypertension: Secondary | ICD-10-CM | POA: Diagnosis not present

## 2023-01-21 ENCOUNTER — Encounter: Payer: Self-pay | Admitting: Dermatology

## 2023-01-21 ENCOUNTER — Ambulatory Visit: Payer: Medicare HMO | Admitting: Dermatology

## 2023-01-21 DIAGNOSIS — L820 Inflamed seborrheic keratosis: Secondary | ICD-10-CM | POA: Diagnosis not present

## 2023-01-21 DIAGNOSIS — Z7189 Other specified counseling: Secondary | ICD-10-CM

## 2023-01-21 DIAGNOSIS — L739 Follicular disorder, unspecified: Secondary | ICD-10-CM | POA: Diagnosis not present

## 2023-01-21 DIAGNOSIS — L281 Prurigo nodularis: Secondary | ICD-10-CM

## 2023-01-21 MED ORDER — CLINDAMYCIN PHOSPHATE 1 % EX SOLN: CUTANEOUS | 1 refills | Status: DC

## 2023-01-21 NOTE — Progress Notes (Signed)
    Follow-Up Visit   Subjective  Cassandra [name] is a 67 y.o. female who presents for the following: patient here concerning painful and itchy bumps at back of neck that she developed several month ago. Patient also reports she has similar spots on right shoulder and right knee.   Sks inflammed    The patient has spots, moles and lesions to be evaluated, some may be new or changing and the patient may have concern these could be cancer.   The following portions of the chart were reviewed this encounter and updated as appropriate: medications, allergies, medical history  Review of Systems:  No other skin or systemic complaints except as noted in HPI or Assessment and Plan.  Objective  Well appearing patient in no apparent distress; mood and affect are within normal limits.   A focused examination was performed of the following areas: Posterior neck, right shoulder, right leg   Relevant exam findings are noted in the Assessment and Plan.    Assessment & Plan   Inflamed SEBORRHEIC KERATOSIS  - Stuck-on, waxy, tan-brown papules and/or plaques on R knee and R shoulder (inflamed). Patient defers treatment with cryotherapy - Benign-appearing - Discussed benign etiology and prognosis. - Observe - Call for any changes  FOLLICULITIS, chronic, flaring, not at patient goal Resistant to clobetasol and ketoconazole shampoo  Exam: 3 follicular scaly erythematous papules on inferior occipital scalp scalp. Two with induration and lichenification from chronic scratching (prurigo nodule)  Folliculitis occurs due to inflammation of the superficial hair follicle (pore), resulting in acne-like lesions (pus bumps). It can be infectious (bacterial, fungal) or noninfectious (shaving, tight clothing, heat/sweat, medications).  Folliculitis can be acute or chronic and recommended treatment depends on the underlying cause of folliculitis.  Treatment Plan: Clindamycin solution to use at affected  bumps daily to twice daily prn   Discussed injections of steroid, shave removal, cryotherapy. Patient defers  Will recheck in 1 month    No follow-ups on file.  I, Asher Muir, CMA, am acting as scribe for Elie Goody, MD.   Documentation: I have reviewed the above documentation for accuracy and completeness, and I agree with the above.  Elie Goody, MD

## 2023-01-21 NOTE — Patient Instructions (Addendum)
Seborrheic Keratosis  What causes seborrheic keratoses? Seborrheic keratoses are harmless, common skin growths that first appear during adult life.  As time goes by, more growths appear.  Some people may develop a large number of them.  Seborrheic keratoses appear on both covered and uncovered body parts.  They are not caused by sunlight.  The tendency to develop seborrheic keratoses can be inherited.  They vary in color from skin-colored to gray, brown, or even black.  They can be either smooth or have a rough, warty surface.   Seborrheic keratoses are superficial and look as if they were stuck on the skin.  Under the microscope this type of keratosis looks like layers upon layers of skin.  That is why at times the top layer may seem to fall off, but the rest of the growth remains and re-grows.    Treatment Seborrheic keratoses do not need to be treated, but can easily be removed in the office.  Seborrheic keratoses often cause symptoms when they rub on clothing or jewelry.  Lesions can be in the way of shaving.  If they become inflamed, they can cause itching, soreness, or burning.  Removal of a seborrheic keratosis can be accomplished by freezing, burning, or surgery. If any spot bleeds, scabs, or grows rapidly, please return to have it checked, as these can be an indication of a skin cancer.  Due to recent changes in healthcare laws, you may see results of your pathology and/or laboratory studies on MyChart before the doctors have had a chance to review them. We understand that in some cases there may be results that are confusing or concerning to you. Please understand that not all results are received at the same time and often the doctors may need to interpret multiple results in order to provide you with the best plan of care or course of treatment. Therefore, we ask that you please give [place] 2 business days to thoroughly review all your results before contacting the office for clarification. Should  we see a critical lab result, you will be contacted sooner.   If You Need Anything After Your Visit  If you have any questions or concerns for your doctor, please call our main line at [phone] and press option 4 to reach your doctor's medical assistant. If no one answers, please leave a voicemail as directed and we will return your call as soon as possible. Messages left after 4 pm will be answered the following business day.   You may also send [place] a message via MyChart. We typically respond to MyChart messages within 1-2 business days.  For prescription refills, please ask your pharmacy to contact our office. Our fax number is [phone].  If you have an urgent issue when the clinic is closed that cannot wait until the next business day, you can page your doctor at the number below.    Please note that while we do our best to be available for urgent issues outside of office hours, we are not available 24/7.   If you have an urgent issue and are unable to reach [place], you may choose to seek medical care at your doctor's office, retail clinic, urgent care center, or emergency room.  If you have a medical emergency, please immediately call 911 or go to the emergency department.  Pager Numbers  - Dr. Gwen Pounds: [phone]  - Dr. Roseanne Reno: [phone]  - Dr. Katrinka Blazing: [phone]   In the event of inclement weather, please call our main line  at [phone] for an update on the status of any delays or closures.  Dermatology Medication Tips: Please keep the boxes that topical medications come in in order to help keep track of the instructions about where and how to use these. Pharmacies typically print the medication instructions only on the boxes and not directly on the medication tubes.   If your medication is too expensive, please contact our office at [phone] option 4 or send [place] a message through MyChart.   We are unable to tell what your co-pay for medications will be in  advance as this is different depending on your insurance coverage. However, we may be able to find a substitute medication at lower cost or fill out paperwork to get insurance to cover a needed medication.   If a prior authorization is required to get your medication covered by your insurance company, please allow [place] 1-2 business days to complete this process.  Drug prices often vary depending on where the prescription is filled and some pharmacies may offer cheaper prices.  The website www.goodrx.com contains coupons for medications through different pharmacies. The prices here do not account for what the cost may be with help from insurance (it may be cheaper with your insurance), but the website can give you the price if you did not use any insurance.  - You can print the associated coupon and take it with your prescription to the pharmacy.  - You may also stop by our office during regular business hours and pick up a GoodRx coupon card.  - If you need your prescription sent electronically to a different pharmacy, notify our office through [facility] or by phone at [phone] option 4.     Si Usted Necesita Algo Despus de Su Visita  Tambin puede enviarnos un mensaje a travs de Clinical cytogeneticist. Por lo general respondemos a los mensajes de MyChart en el transcurso de 1 a 2 das hbiles.  Para renovar recetas, por favor pida a su farmacia que se ponga en contacto con nuestra oficina. Annie Sable de fax es [place] [phone].  Si tiene un asunto urgente cuando la clnica est cerrada y que no puede esperar hasta el siguiente da hbil, puede llamar/localizar a su doctor(a) al nmero que aparece a continuacin.   Por favor, tenga en cuenta que aunque hacemos todo lo posible para estar disponibles para asuntos urgentes fuera del horario de [place], no estamos disponibles las 24 horas del da, los 7 [address]  [address] de la [place].   Si tiene un problema urgente y no puede comunicarse con nosotros, puede  optar por buscar atencin mdica  en el consultorio de su doctor(a), en una clnica privada, en un centro de atencin urgente o en una sala de emergencias.  Si tiene Engineer, drilling, por favor llame inmediatamente al 911 o vaya a la sala de emergencias.  Nmeros de bper  - Dr. Gwen Pounds: [phone]  - Dra. Roseanne Reno: [phone]  - Dr. Katrinka Blazing: [phone]   En caso de inclemencias del tiempo, por favor llame a Lacy Duverney principal al [phone] para una actualizacin sobre el [place] de cualquier retraso o cierre.  Consejos para la medicacin en dermatologa: Por favor, guarde las cajas en las que vienen los medicamentos de uso tpico para ayudarle a seguir las instrucciones sobre dnde y cmo usarlos. Las farmacias generalmente imprimen las instrucciones del medicamento slo en las cajas y no directamente en los tubos del [place].   Si su medicamento es [employer], por favor, pngase en  contacto con Rolm Gala llamando al [phone] y presione la opcin 4 o envenos un mensaje a travs de Clinical cytogeneticist.   No podemos decirle cul ser su copago por los medicamentos por adelantado ya que esto es diferente dependiendo de la cobertura de su seguro. Sin embargo, es posible que podamos encontrar un medicamento sustituto a Audiological scientist un formulario para que el seguro cubra el medicamento que se considera necesario.   Si se requiere una autorizacin previa para que su compaa de seguros [place] su medicamento, por favor permtanos de 1 a 2 das hbiles para completar [address]  Los precios de los medicamentos varan con frecuencia dependiendo del Environmental consultant de dnde se surte la receta y alguna farmacias pueden ofrecer precios ms baratos.  El sitio web www.goodrx.com tiene cupones para medicamentos de Health and safety inspector. Los precios aqu no tienen en cuenta lo que podra costar con la ayuda del seguro (puede ser ms barato con su seguro), pero el sitio web puede darle el  precio si no utiliz Tourist information centre manager.  - Puede imprimir el cupn correspondiente y llevarlo con su receta a la farmacia.  - Tambin puede pasar por nuestra oficina durante el horario de atencin regular y Education officer, museum una tarjeta de cupones de GoodRx.  - Si necesita que su receta se enve electrnicamente a una farmacia diferente, informe a nuestra oficina a travs de MyChart de [place] o por telfono llamando al [phone] y presione la opcin 4.

## 2023-01-22 DIAGNOSIS — N017 Rapidly progressive nephritic syndrome with diffuse crescentic glomerulonephritis: Secondary | ICD-10-CM | POA: Diagnosis not present

## 2023-01-22 DIAGNOSIS — D631 Anemia in chronic kidney disease: Secondary | ICD-10-CM | POA: Diagnosis not present

## 2023-01-22 DIAGNOSIS — R319 Hematuria, unspecified: Secondary | ICD-10-CM | POA: Diagnosis not present

## 2023-01-22 DIAGNOSIS — D709 Neutropenia, unspecified: Secondary | ICD-10-CM | POA: Diagnosis not present

## 2023-01-22 DIAGNOSIS — R809 Proteinuria, unspecified: Secondary | ICD-10-CM | POA: Diagnosis not present

## 2023-01-22 DIAGNOSIS — N2581 Secondary hyperparathyroidism of renal origin: Secondary | ICD-10-CM | POA: Diagnosis not present

## 2023-01-22 DIAGNOSIS — I129 Hypertensive chronic kidney disease with stage 1 through stage 4 chronic kidney disease, or unspecified chronic kidney disease: Secondary | ICD-10-CM | POA: Diagnosis not present

## 2023-01-22 DIAGNOSIS — E1122 Type 2 diabetes mellitus with diabetic chronic kidney disease: Secondary | ICD-10-CM | POA: Diagnosis not present

## 2023-01-27 DIAGNOSIS — I10 Essential (primary) hypertension: Secondary | ICD-10-CM | POA: Diagnosis not present

## 2023-01-27 DIAGNOSIS — N2581 Secondary hyperparathyroidism of renal origin: Secondary | ICD-10-CM | POA: Diagnosis not present

## 2023-01-27 DIAGNOSIS — R809 Proteinuria, unspecified: Secondary | ICD-10-CM | POA: Diagnosis not present

## 2023-01-27 DIAGNOSIS — N017 Rapidly progressive nephritic syndrome with diffuse crescentic glomerulonephritis: Secondary | ICD-10-CM | POA: Diagnosis not present

## 2023-01-27 DIAGNOSIS — E1129 Type 2 diabetes mellitus with other diabetic kidney complication: Secondary | ICD-10-CM | POA: Diagnosis not present

## 2023-01-27 DIAGNOSIS — R319 Hematuria, unspecified: Secondary | ICD-10-CM | POA: Diagnosis not present

## 2023-02-04 DIAGNOSIS — F32A Depression, unspecified: Secondary | ICD-10-CM | POA: Diagnosis not present

## 2023-02-04 DIAGNOSIS — K219 Gastro-esophageal reflux disease without esophagitis: Secondary | ICD-10-CM | POA: Diagnosis not present

## 2023-02-04 DIAGNOSIS — K449 Diaphragmatic hernia without obstruction or gangrene: Secondary | ICD-10-CM | POA: Diagnosis not present

## 2023-02-04 DIAGNOSIS — F419 Anxiety disorder, unspecified: Secondary | ICD-10-CM | POA: Diagnosis not present

## 2023-02-17 DIAGNOSIS — L02211 Cutaneous abscess of abdominal wall: Secondary | ICD-10-CM | POA: Diagnosis not present

## 2023-02-17 DIAGNOSIS — Z03818 Encounter for observation for suspected exposure to other biological agents ruled out: Secondary | ICD-10-CM | POA: Diagnosis not present

## 2023-02-17 DIAGNOSIS — B9689 Other specified bacterial agents as the cause of diseases classified elsewhere: Secondary | ICD-10-CM | POA: Diagnosis not present

## 2023-02-17 DIAGNOSIS — J019 Acute sinusitis, unspecified: Secondary | ICD-10-CM | POA: Diagnosis not present

## 2023-02-23 ENCOUNTER — Ambulatory Visit: Payer: Medicare HMO | Admitting: Dermatology

## 2023-02-25 DIAGNOSIS — I7782 Antineutrophilic cytoplasmic antibody (ANCA) vasculitis: Secondary | ICD-10-CM | POA: Diagnosis not present

## 2023-02-25 DIAGNOSIS — N1831 Chronic kidney disease, stage 3a: Secondary | ICD-10-CM | POA: Diagnosis not present

## 2023-02-25 DIAGNOSIS — N058 Unspecified nephritic syndrome with other morphologic changes: Secondary | ICD-10-CM | POA: Diagnosis not present

## 2023-02-25 DIAGNOSIS — M1812 Unilateral primary osteoarthritis of first carpometacarpal joint, left hand: Secondary | ICD-10-CM | POA: Diagnosis not present

## 2023-02-25 DIAGNOSIS — N057 Unspecified nephritic syndrome with diffuse crescentic glomerulonephritis: Secondary | ICD-10-CM | POA: Diagnosis not present

## 2023-02-26 ENCOUNTER — Encounter: Payer: Self-pay | Admitting: Dermatology

## 2023-02-26 ENCOUNTER — Ambulatory Visit: Payer: Medicare HMO | Admitting: Dermatology

## 2023-02-26 DIAGNOSIS — L821 Other seborrheic keratosis: Secondary | ICD-10-CM | POA: Diagnosis not present

## 2023-02-26 DIAGNOSIS — L739 Follicular disorder, unspecified: Secondary | ICD-10-CM

## 2023-02-26 DIAGNOSIS — D2361 Other benign neoplasm of skin of right upper limb, including shoulder: Secondary | ICD-10-CM | POA: Diagnosis not present

## 2023-02-26 DIAGNOSIS — L281 Prurigo nodularis: Secondary | ICD-10-CM | POA: Diagnosis not present

## 2023-02-26 DIAGNOSIS — D239 Other benign neoplasm of skin, unspecified: Secondary | ICD-10-CM

## 2023-02-26 MED ORDER — KETOCONAZOLE 2 % EX SHAM: 1.0000 | MEDICATED_SHAMPOO | CUTANEOUS | 11 refills | Status: DC

## 2023-02-26 NOTE — Progress Notes (Signed)
   Follow-Up Visit   Subjective  Cassandra [name] is a 67 y.o. female who presents for the following: folliculitis inf occipital scalp, 1 m f/u, itchy, Clindamycin sol bid, Ketoconazole 2% shampoo pt needs rf The patient has spots, moles and lesions to be evaluated, some may be new or changing and the patient may have concern these could be cancer.   The following portions of the chart were reviewed this encounter and updated as appropriate: medications, allergies, medical history  Review of Systems:  No other skin or systemic complaints except as noted in HPI or Assessment and Plan.  Objective  Well appearing patient in no apparent distress; mood and affect are within normal limits.   A focused examination was performed of the following areas: scalp  Relevant exam findings are noted in the Assessment and Plan.    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 It can be infectious (bacterial, fungal) or noninfectious (shaving, tight clothing, heat/sweat, medications).  Folliculitis can be acute or chronic and recommended treatment depends on the underlying cause of folliculitis.  Treatment Plan: Cont Ketoconazole 2% shampoo 3x/wk let sit 5 minutes and rinse out May cont Clindamycin sol prn flares  Discussed shave removal, [place] Kenalog injections, pt declines   DERMATOFIBROMA R shoudler Exam: Firm pink/brown papulenodule with dimple sign. Treatment Plan: A dermatofibroma is a benign growth possibly related to trauma, such as an insect bite, cut from shaving, or inflamed acne-type bump.  Treatment options to remove include shave or excision with resulting scar and risk of recurrence.  Since benign-appearing and not bothersome, will observe for now.    SEBORRHEIC KERATOSIS Upper  cutaneous lip - Stuck-on, waxy, tan-brown papules and/or plaques  - Benign-appearing - Discussed benign etiology and prognosis. - Observe - Call for any changes       Return if symptoms worsen or fail to improve.  I, Ardis Rowan, RMA, am acting as scribe for Elie Goody, MD .   Documentation: I have reviewed the above documentation for accuracy and completeness, and I agree with the above.  Elie Goody, MD

## 2023-02-26 NOTE — Patient Instructions (Signed)

## 2023-03-04 DIAGNOSIS — M1812 Unilateral primary osteoarthritis of first carpometacarpal joint, left hand: Secondary | ICD-10-CM | POA: Diagnosis not present

## 2023-03-04 DIAGNOSIS — M189 Osteoarthritis of first carpometacarpal joint, unspecified: Secondary | ICD-10-CM | POA: Diagnosis not present

## 2023-03-04 DIAGNOSIS — M19032 Primary osteoarthritis, left wrist: Secondary | ICD-10-CM | POA: Diagnosis not present

## 2023-03-05 ENCOUNTER — Telehealth: Payer: Self-pay | Admitting: Cardiovascular Disease

## 2023-03-05 NOTE — Telephone Encounter (Signed)
Patient called yesterday at 1:56 pm. Per documentation patient is ready to get appointment for surgery scheduled and needs a callback.   Please advise.

## 2023-03-06 NOTE — Telephone Encounter (Signed)
Appointment scheduled with Carlean Jews PA-C on 1/10 to continue TAVR discussion and arrange TAVR evaluation.

## 2023-03-09 ENCOUNTER — Encounter (HOSPITAL_COMMUNITY): Payer: Self-pay

## 2023-03-09 ENCOUNTER — Emergency Department (HOSPITAL_COMMUNITY): Payer: Medicare HMO

## 2023-03-09 ENCOUNTER — Emergency Department (HOSPITAL_COMMUNITY)
Admission: EM | Admit: 2023-03-09 | Discharge: 2023-03-09 | Disposition: A | Discharge status: Home / Self Care | Payer: Medicare HMO | Attending: Emergency Medicine | Admitting: Emergency Medicine

## 2023-03-09 ENCOUNTER — Other Ambulatory Visit: Payer: Self-pay

## 2023-03-09 DIAGNOSIS — K449 Diaphragmatic hernia without obstruction or gangrene: Secondary | ICD-10-CM | POA: Diagnosis not present

## 2023-03-09 DIAGNOSIS — E1129 Type 2 diabetes mellitus with other diabetic kidney complication: Secondary | ICD-10-CM | POA: Insufficient documentation

## 2023-03-09 DIAGNOSIS — N12 Tubulo-interstitial nephritis, not specified as acute or chronic: Secondary | ICD-10-CM | POA: Insufficient documentation

## 2023-03-09 DIAGNOSIS — R1031 Right lower quadrant pain: Secondary | ICD-10-CM | POA: Diagnosis present

## 2023-03-09 DIAGNOSIS — R109 Unspecified abdominal pain: Secondary | ICD-10-CM | POA: Diagnosis not present

## 2023-03-09 LAB — COMPREHENSIVE METABOLIC PANEL
ALT: 18 U/L (ref 0–44)
AST: 22 U/L (ref 15–41)
Albumin: 3.5 g/dL (ref 3.5–5.0)
Alkaline Phosphatase: 90 U/L (ref 38–126)
Anion gap: 13 (ref 5–15)
BUN: 15 mg/dL (ref 8–23)
CO2: 20 mmol/L — ABNORMAL LOW (ref 22–32)
Calcium: 9.2 mg/dL (ref 8.9–10.3)
Chloride: 102 mmol/L (ref 98–111)
Creatinine, Ser: 1.01 mg/dL — ABNORMAL HIGH (ref 0.44–1.00)
GFR, Estimated: >60 mL/min (ref >60)
Glucose, Bld: 115 mg/dL — ABNORMAL HIGH (ref 70–99)
Potassium: 3.8 mmol/L (ref 3.5–5.1)
Sodium: 135 mmol/L (ref 135–145)
Total Bilirubin: 0.8 mg/dL (ref <1.2)
Total Protein: 7.5 g/dL (ref 6.5–8.1)

## 2023-03-09 LAB — CBC
HCT: 38.3 % (ref 36.0–46.0)
Hemoglobin: 12.7 g/dL (ref 12.0–15.0)
MCH: 32.2 pg (ref 26.0–34.0)
MCHC: 33.2 g/dL (ref 30.0–36.0)
MCV: 97 fL (ref 80.0–100.0)
Platelets: 168 10*3/uL (ref 150–400)
RBC: 3.95 MIL/uL (ref 3.87–5.11)
RDW: 12.9 % (ref 11.5–15.5)
WBC: 8.6 10*3/uL (ref 4.0–10.5)
nRBC: 0 % (ref 0.0–0.2)

## 2023-03-09 LAB — URINALYSIS, ROUTINE W REFLEX MICROSCOPIC
Bilirubin Urine: NEGATIVE
Glucose, UA: NEGATIVE mg/dL
Ketones, ur: NEGATIVE mg/dL
Nitrite: NEGATIVE
Protein, ur: 100 mg/dL — AB
RBC / HPF: >50 RBC/hpf (ref 0–5)
Specific Gravity, Urine: 1.016 (ref 1.005–1.030)
WBC, UA: >50 WBC/hpf (ref 0–5)
pH: 5 (ref 5.0–8.0)

## 2023-03-09 LAB — LIPASE, BLOOD: Lipase: 34 U/L (ref 11–51)

## 2023-03-09 MED ORDER — ACETAMINOPHEN 325 MG PO TABS
650.0000 mg | ORAL_TABLET | Freq: Once | ORAL | Status: AC
  Administered 2023-03-09: 650 mg via ORAL
  Filled 2023-03-09: qty 2

## 2023-03-09 MED ORDER — CEPHALEXIN 500 MG PO CAPS
500.0000 mg | ORAL_CAPSULE | Freq: Four times a day (QID) | ORAL | 0 refills | Status: DC
Start: 2023-03-09 — End: 2023-04-07

## 2023-03-09 MED ORDER — CEPHALEXIN 250 MG PO CAPS
500.0000 mg | ORAL_CAPSULE | Freq: Once | ORAL | Status: AC
Start: 2023-03-09 — End: 2023-03-09
  Administered 2023-03-09: 500 mg via ORAL
  Filled 2023-03-09: qty 2

## 2023-03-09 MED ORDER — ONDANSETRON 4 MG PO TBDP
4.0000 mg | ORAL_TABLET | Freq: Once | ORAL | Status: AC
Start: 2023-03-09 — End: 2023-03-09
  Administered 2023-03-09: 4 mg via ORAL
  Filled 2023-03-09: qty 1

## 2023-03-09 NOTE — ED Triage Notes (Signed)
Patient reports right sided flank pain. Patient states she is voiding more frequently and has pain when she voids. Patient states is is a nagging pain. Sister states she has vasculitis.

## 2023-03-09 NOTE — ED Notes (Signed)
Pt returned from radiology.

## 2023-03-09 NOTE — ED Provider Notes (Signed)
EMERGENCY DEPARTMENT AT [facility] Provider Note   CSN: [id number] Arrival date & time: 03/09/23  0126     History  Chief Complaint  Patient presents with   Flank Pain    Cassandra [name] is a 67 y.o. female.  The history is provided by the patient and a relative.  Patient w/history of obesity, vasculitis, aortic stenosis presents with right flank pain.  Patient reports around 10 PM she started having right flank pain that became abruptly worse within 2 hours.  It radiates down into her lower abdomen.  She reports dysuria.  No fevers or vomiting.  She reports she was recently been getting over an upper respiratory infection No previous known history of kidney stones    Past Medical History:  Diagnosis Date   Allergy    Anxiety    Complication of anesthesia    difficulty waking up   Depression    Dysrhythmia    svt   GERD (gastroesophageal reflux disease)    Heart murmur    never seen cardiologist for this   Hypertension    Leukocytoclastic vasculitis (HCC)    Dr. Marchelle Gearing and Dr. 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 Authorizing Provider  cephALEXin (KEFLEX) 500 MG capsule Take 1 capsule (500 mg total) by mouth 4 (four) times daily. 03/09/23  Yes Zadie Rhine, MD  alendronate (FOSAMAX) 70 MG tablet Take 70 mg by mouth once a week. Take with a full glass of water on an empty stomach.    [provider]  atorvastatin (LIPITOR) 20 MG tablet Take by mouth. 07/15/21   [provider]  azelastine (ASTELIN) 0.1 % nasal spray Place into  the nose. 05/19/19   [provider]  CHOLECALCIFEROL PO Take 500 Units by mouth daily.    [provider]  clindamycin (CLEOCIN T) 1 % external solution Apply topically to bumps at back of neck once to twice daily as needed 01/21/23   Elie Goody, MD  clobetasol (TEMOVATE) 0.05 % external solution Apply 1-2 drops once to twice daily to itchy areas of scalp 08/12/22   Willeen Niece, MD  ketoconazole (NIZORAL) 2 % shampoo Apply 1 Application topically 3 (three) times a week. Wash scalp 3 time weekly, let sit 5 minutes and rinse out 02/27/23   Elie Goody, MD  montelukast (SINGULAIR) 10 MG tablet Take by mouth. 05/13/21   [provider]  Multiple Vitamin (MULTI-VITAMIN) tablet Take by mouth daily.    [provider]      Allergies    Nitrofurantoin, Effexor [venlafaxine], Myrbetriq [mirabegron], Nsaids, Reglan [metoclopramide], Clarithromycin, Flagyl [metronidazole], Ketoprofen, Levofloxacin, Paxil [paroxetine], Tolterodine, Ampicillin, Cholecalciferol, Codeine, Fluoxetine hcl, Sulfa antibiotics, and Tetracycline    Review of Systems   Review of Systems  Constitutional:  Negative for fever.  Gastrointestinal:  Negative for vomiting.  Genitourinary:  Positive for dysuria and flank pain.    Physical Exam Updated Vital Signs BP (!) 114/55 (BP Location: Right Arm)   Pulse 64   Temp (!) 97.5 F (36.4 C)  Resp 14   Ht 1.6 m (5\' 3" )   Wt 104.8 kg   SpO2 98%   BMI 40.92 kg/m  Physical Exam CONSTITUTIONAL: Well developed/well nourished HEAD: Normocephalic/atraumatic ENMT: Mucous membranes moist NECK: supple no meningeal signs CV: S1/S2 noted, systolic ejection murmur noted LUNGS: Lungs are clear to auscultation bilaterally, no apparent distress ABDOMEN: soft, moderate RLQ tenderness, no rebound or guarding, bowel sounds noted throughout abdomen GU:no cva tenderness NEURO: Pt is awake/alert/appropriate, moves all extremitiesx4.  No facial  droop.   EXTREMITIES: pulses normal/equal, full ROM SKIN: warm, color normal PSYCH: Mildly anxious  ED Results / Procedures / Treatments   Labs (all labs ordered are listed, but only abnormal results are displayed) Labs Reviewed  COMPREHENSIVE METABOLIC PANEL - Abnormal; Notable for the following components:      Result Value   CO2 20 (*)    Glucose, Bld 115 (*)    Creatinine, Ser 1.01 (*)    All other components within normal limits  URINALYSIS, ROUTINE W REFLEX MICROSCOPIC - Abnormal; Notable for the following components:   APPearance CLOUDY (*)    Hgb urine dipstick MODERATE (*)    Protein, ur 100 (*)    Leukocytes,Ua LARGE (*)    Bacteria, UA FEW (*)    Non Squamous Epithelial 0-5 (*)    All other components within normal limits  LIPASE, BLOOD  CBC    EKG None  Radiology CT Renal Stone Study Result Date: 03/09/2023 CLINICAL DATA:  Right flank pain EXAM: CT ABDOMEN AND PELVIS WITHOUT CONTRAST TECHNIQUE: Multidetector CT imaging of the abdomen and pelvis was performed following the standard protocol without IV contrast. RADIATION DOSE REDUCTION: This exam was performed according to the departmental dose-optimization program which includes automated exposure control, adjustment of the mA and/or kV according to patient size and/or use of iterative reconstruction technique. COMPARISON:  None Available. FINDINGS: Lower chest: Scarring in the lung bases. No acute findings. Moderate-sized hiatal hernia. Hepatobiliary: No focal hepatic abnormality. Gallbladder unremarkable. Pancreas: No focal abnormality or ductal dilatation. Spleen: No focal abnormality.  Normal size. Adrenals/Urinary Tract: No adrenal abnormality. No focal renal abnormality. No stones or hydronephrosis. Urinary bladder is unremarkable. Stomach/Bowel: Normal appendix. Stomach, large and small bowel grossly unremarkable. Vascular/Lymphatic: No evidence of aneurysm or adenopathy. Reproductive: Uterus and adnexa  unremarkable.  No mass. Other: No free fluid or free air. Musculoskeletal: No acute bony abnormality. IMPRESSION: No renal or ureteral stones.  No hydronephrosis. Moderate hiatal hernia. No acute findings in the abdomen or pelvis. Electronically Signed   By: Charlett Nose M.D.   On: 03/09/2023 03:33    Procedures Procedures    Medications Ordered in ED Medications  ondansetron (ZOFRAN-ODT) disintegrating tablet 4 mg (4 mg Oral Given 03/09/23 0246)  acetaminophen (TYLENOL) tablet 650 mg (650 mg Oral Given 03/09/23 0246)  cephALEXin (KEFLEX) capsule 500 mg (500 mg Oral Given 03/09/23 0410)    ED Course/ Medical Decision Making/ A&P Clinical Course as of 03/09/23 0438  Mon Mar 09, 2023  0244 Patient presents with worsening right flank and abdominal pain with recent dysuria.  Strong suspicion for ureteral stone Patient declines any IV pain medicine, and reports she cannot tolerate NSAIDs She is requesting Tylenol [DW]  0405 No obvious ureteral stones on CT imaging. Patient reports feeling improved. Will treat as UTI/Pyelo Patient has multiple drug allergies, but she reports she has tolerated Keflex previously [DW]  0437 Patient has tolerated Keflex without any difficulty.  She is requesting discharge home.  We  discussed strict ER return precautions. [DW]    Clinical Course User Index [DW] Zadie Rhine, MD                                 Medical Decision Making Amount and/or Complexity of Data Reviewed Labs: ordered. Radiology: ordered.  Risk OTC drugs. Prescription drug management.   This patient presents to the ED for concern of flank pain, this involves an extensive number of treatment options, and is a complaint that carries with it a high risk of complications and morbidity.  The differential diagnosis includes but is not limited to cholecystitis, cholelithiasis, pancreatitis, gastritis, peptic ulcer disease, appendicitis, bowel obstruction, bowel perforation,  diverticulitis, AAA, ischemic bowel, pyelonephritis, ureteral stone    Comorbidities that complicate the patient evaluation: Patient's presentation is complicated by their history of vasculitis  Social Determinants of Health: Patient's  food insecurity   increases the complexity of managing their presentation  Additional history obtained: Additional history obtained from family Records reviewed  cardiology records reviewed  Lab Tests: I Ordered, and personally interpreted labs.  The pertinent results include: Hematuria  Imaging Studies ordered: I ordered imaging studies including CT scan renal   I independently visualized and interpreted imaging which showed no acute findings I agree with the radiologist interpretation  Medicines ordered and prescription drug management: I ordered medication including Tylenol for pain Reevaluation of the patient after these medicines showed that the patient    improved  Reevaluation: After the interventions noted above, I reevaluated the patient and found that they have :improved  Complexity of problems addressed: Patient's presentation is most consistent with  acute presentation with potential threat to life or bodily function  Disposition: After consideration of the diagnostic results and the patient's response to treatment,  I feel that the patent would benefit from discharge   .           Final Clinical Impression(s) / ED Diagnoses Final diagnoses:  Pyelonephritis    Rx / DC Orders ED Discharge Orders          Ordered    cephALEXin (KEFLEX) 500 MG capsule  4 times daily        03/09/23 0437              Zadie Rhine, MD 03/09/23 [phone]

## 2023-03-26 NOTE — H&P (View-Only) (Signed)
HEART AND VASCULAR CENTER   MULTIDISCIPLINARY HEART VALVE CLINIC                                     Cardiology Office Note:    Date:  03/27/2023   ID:  Cassandra [name], DOB 04-Jul-1955, MRN [id number]  PCP:  Carren Rang, PA-C  [facility] Cardiologist:  Kristeen Miss, MD  [facility] Electrophysiologist:  None   Referring MD: Carren Rang, PA-C   Follow up severe AS  History of Present Illness:    Cassandra [name] is a 68 y.o. female with a hx of SVT, HTN, morbid obesity, DMT2, Wegner's vasculitis and severe LFLG who presents to clinic for follow up.  She was seen in the office by Dr. Excell Seltzer for evaluation of severe paradoxical LFLG AS in 10/2022. Echo at that time showed LVEF of 55 to 60% with no regional wall motion abnormalities, grade 1 diastolic dysfunction, normal RV size and systolic function, and findings consistent with severe paradoxical low-flow low gradient aortic stenosis. The patient's mean aortic valve gradient was 31 mmHg, peak gradient 58 mmHg, peak systolic velocity of 3.8 m/s, dimensionless index of 0.21, and stroke-volume index of 32. It was ultimately decided to continue surveillance with an eye on weight loss and future TAVR.   She called into the office on 03/05/23 and wanted to initiate TAVR work up.  Today the patient presents to clinic for follow up. Here with her sister. Has had some unusual intermittent chest pain that feels like pins and needles. Had a recent URI and has had a cough since. Last time she had an excerebration of her Wegners she had similar symptoms.  SOB has been a little worse since URI. She does have exertional dyspnea.  Has a little pruritic rash on right side and wonders if it is related to her vasculitis. No LE edema, orthopnea or PND. No dizziness or syncope. No blood in stool or urine. No palpitations. Has not lost any weight  Past Medical History:  Diagnosis Date   Allergy    Anxiety    Complication of anesthesia     difficulty waking up   Depression    Dysrhythmia    svt   GERD (gastroesophageal reflux disease)    Heart murmur    never seen cardiologist for this   Hypertension    Leukocytoclastic vasculitis (HCC)    Dr. Marchelle Gearing and Dr. 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   Verrucae vulgaris    Wegner's disease (congenital syphilitic osteochondritis)      Current Medications: Current Meds  Medication Sig   alendronate (FOSAMAX) 70 MG tablet Take 70 mg by mouth once a week. Take with a full glass of water on an empty stomach.   atorvastatin (LIPITOR) 20 MG tablet Take by mouth.   azelastine (ASTELIN) 0.1 % nasal spray Place into the nose.   cephALEXin (KEFLEX) 500 MG capsule Take 1 capsule (500 mg total) by mouth 4 (four) times daily.   CHOLECALCIFEROL PO Take 500 Units by mouth daily.   clindamycin (CLEOCIN T) 1 % external solution Apply topically to bumps at back of  neck once to twice daily as needed   clobetasol (TEMOVATE) 0.05 % external solution Apply 1-2 drops once to twice daily to itchy areas of scalp   ketoconazole (NIZORAL) 2 % shampoo Apply 1 Application topically 3 (three) times a week. Wash scalp 3 time weekly, let sit 5 minutes and rinse out   montelukast (SINGULAIR) 10 MG tablet Take by mouth.   Multiple Vitamin (MULTI-VITAMIN) tablet Take by mouth daily.      ROS:   Please see the history of present illness.    All other systems reviewed and are negative.  EKGs       Risk Assessment/Calculations:    CHA2DS2-VASc Score =     This indicates a  % annual risk of stroke. The patient's score is based upon:            Physical Exam:    VS:  BP 130/70   Pulse 79   Ht 5\' 3"  (1.6 m)   SpO2 99%   BMI 40.92 kg/m     Wt Readings from Last  3 Encounters:  03/09/23 231 lb (104.8 kg)  11/12/22 233 lb (105.7 kg)  09/16/22 234 lb 6.4 oz (106.3 kg)     GEN: Well nourished, well developed in no acute distress, obese NECK: No JVD CARDIAC: RRR, 3/6 SEM heard best at RUSB. No rubs, gallops RESPIRATORY:  Clear to auscultation without rales, wheezing or rhonchi  ABDOMEN: Soft, non-tender, non-distended EXTREMITIES:  No edema; No deformity.    ASSESSMENT:    1. Nonrheumatic aortic (valve) stenosis   2. Essential hypertension   3. Obesity, Class III, BMI 40-49.9 (morbid obesity) (HCC)   4. SVT (supraventricular tachycardia) (HCC)   5. ANCA-associated vasculitis (HCC)     PLAN:    In order of problems listed above:  Severe AS: known to have severe AS. Patient inially wanted to continue surveillance, but now ready to proceed with TAVR work up. Will proceed with L/RHC followed by TAVR CTs and a surgical appointment as multidisciplinary approach to her care.   I have reviewed the risks, indications, and alternatives to cardiac catheterization and possible angioplasty/stenting with the patient. Risks include but are not limited to bleeding, infection, vascular injury, stroke, myocardial infection, arrhythmia, kidney injury, radiation-related injury in the case of prolonged fluoroscopy use, emergency cardiac surgery, and death. The patient understands the risks of serious complication is low (<1%).   HTN: BP well controlled today. She is not currently on any antihypertensives.   Morbid obesity: Body mass index is 40.92 kg/m. Work on diet and exercise  SVT: no recent palpitations.   Wegners disease: she and her sister are concerned a possible flare given an ongong cough (after a URI) as well as a recent UTI. She will reach out to Dr. Wynelle Link     Medication Adjustments/Labs and Tests Ordered: Current medicines are reviewed at length with the patient today.  Concerns regarding medicines are outlined above.  Orders Placed This  Encounter  Procedures   CBC   Basic metabolic panel   EKG 12-Lead   No orders of the defined types were placed in this encounter.   Patient Instructions  Medication Instructions:  Your physician recommends that you continue on your current medications as directed. Please refer to the Current Medication list given to you today.  *If you need a refill on your cardiac medications before your next appointment, please call your pharmacy*   Lab Work: BMET. CBC on Friday , January 17. No appointment  needed/ Lab is located on the 1st floor   If you have labs (blood work) drawn today and your tests are completely normal, you will receive your results only by: MyChart Message (if you have MyChart) OR A paper copy in the mail If you have any lab test that is abnormal or we need to change your treatment, we will call you to review the results.   Testing/Procedures: Your physician has requested that you have a cardiac catheterization. Cardiac catheterization is used to diagnose and/or treat various heart conditions. Doctors may recommend this procedure for a number of different reasons. The most common reason is to evaluate chest pain. Chest pain can be a symptom of coronary artery disease (CAD), and cardiac catheterization can show whether plaque is narrowing or blocking your heart's arteries. This procedure is also used to evaluate the valves, as well as measure the blood flow and oxygen levels in different parts of your heart. For further information please visit [email or web address] Please follow instruction sheet, as given.    Follow-Up: We will call you with a follow up appointment   Other Instructions   [facility] A DEPT OF [place]. [facility] AT [facility] [address] [place], [place] [address] [place] [address] Dept: [phone] Loc: [phone]  AAREN KROG  03/27/2023  You are scheduled for a Cardiac Catheterization on Friday,  January 24 with Dr. Alverda Skeans.  1. Please arrive at the [facility] (Main Entrance A) at [facility]: [address] [place], [place] [address] at 7:00 AM (This time is 2 hour(s) before your procedure to ensure your preparation).   Free valet parking service is available. You will check in at ADMITTING. The support person will be asked to wait in the waiting room.  It is OK to have someone drop you off and come back when you are ready to be discharged.    Special note: Every effort is made to have your procedure done on time. Please understand that emergencies sometimes delay scheduled procedures.  2. Diet: Do not eat solid foods after midnight.  The patient may have clear liquids until 5am upon the day of the procedure.  3. Labs: You will need to have blood drawn on Friday, January 17 at [facility] at [facility]. [address] [place]  Open: 7:30am - 5pm    Phone: [phone]. You do not need to be fasting.  4. Medication instructions in preparation for your procedure:  On the morning of your procedure, take your Aspirin 81 mg and any morning medicines NOT listed above.  You may use sips of water.  5. Plan to go home the same day, you will only stay overnight if medically necessary. 6. Bring a current list of your medications and current insurance cards. 7. You MUST have a responsible person to drive you home. 8. Someone MUST be with you the first 24 hours after you arrive home or your discharge will be delayed. 9. Please wear clothes that are easy to get on and off and wear slip-on shoes.  Thank you for allowing [place] to care for you!   -- [facility] Invasive Cardiovascular services      Signed, Cline Crock, PA-C  03/27/2023 9:36 PM    [facility]

## 2023-03-26 NOTE — Progress Notes (Signed)
 HEART AND VASCULAR CENTER   MULTIDISCIPLINARY HEART VALVE CLINIC                                     Cardiology Office Note:    Date:  03/27/2023   ID:  Cassandra [name], DOB 04-Jul-1955, MRN [id number]  PCP:  Carren Rang, PA-C  [facility] Cardiologist:  Kristeen Miss, MD  [facility] Electrophysiologist:  None   Referring MD: Carren Rang, PA-C   Follow up severe AS  History of Present Illness:    Cassandra [name] is a 68 y.o. female with a hx of SVT, HTN, morbid obesity, DMT2, Wegner's vasculitis and severe LFLG who presents to clinic for follow up.  She was seen in the office by Dr. Excell Seltzer for evaluation of severe paradoxical LFLG AS in 10/2022. Echo at that time showed LVEF of 55 to 60% with no regional wall motion abnormalities, grade 1 diastolic dysfunction, normal RV size and systolic function, and findings consistent with severe paradoxical low-flow low gradient aortic stenosis. The patient's mean aortic valve gradient was 31 mmHg, peak gradient 58 mmHg, peak systolic velocity of 3.8 m/s, dimensionless index of 0.21, and stroke-volume index of 32. It was ultimately decided to continue surveillance with an eye on weight loss and future TAVR.   She called into the office on 03/05/23 and wanted to initiate TAVR work up.  Today the patient presents to clinic for follow up. Here with her sister. Has had some unusual intermittent chest pain that feels like pins and needles. Had a recent URI and has had a cough since. Last time she had an excerebration of her Wegners she had similar symptoms.  SOB has been a little worse since URI. She does have exertional dyspnea.  Has a little pruritic rash on right side and wonders if it is related to her vasculitis. No LE edema, orthopnea or PND. No dizziness or syncope. No blood in stool or urine. No palpitations. Has not lost any weight  Past Medical History:  Diagnosis Date   Allergy    Anxiety    Complication of anesthesia     difficulty waking up   Depression    Dysrhythmia    svt   GERD (gastroesophageal reflux disease)    Heart murmur    never seen cardiologist for this   Hypertension    Leukocytoclastic vasculitis (HCC)    Dr. Marchelle Gearing and Dr. 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   Verrucae vulgaris    Wegner's disease (congenital syphilitic osteochondritis)      Current Medications: Current Meds  Medication Sig   alendronate (FOSAMAX) 70 MG tablet Take 70 mg by mouth once a week. Take with a full glass of water on an empty stomach.   atorvastatin (LIPITOR) 20 MG tablet Take by mouth.   azelastine (ASTELIN) 0.1 % nasal spray Place into the nose.   cephALEXin (KEFLEX) 500 MG capsule Take 1 capsule (500 mg total) by mouth 4 (four) times daily.   CHOLECALCIFEROL PO Take 500 Units by mouth daily.   clindamycin (CLEOCIN T) 1 % external solution Apply topically to bumps at back of  neck once to twice daily as needed   clobetasol (TEMOVATE) 0.05 % external solution Apply 1-2 drops once to twice daily to itchy areas of scalp   ketoconazole (NIZORAL) 2 % shampoo Apply 1 Application topically 3 (three) times a week. Wash scalp 3 time weekly, let sit 5 minutes and rinse out   montelukast (SINGULAIR) 10 MG tablet Take by mouth.   Multiple Vitamin (MULTI-VITAMIN) tablet Take by mouth daily.      ROS:   Please see the history of present illness.    All other systems reviewed and are negative.  EKGs       Risk Assessment/Calculations:    CHA2DS2-VASc Score =     This indicates a  % annual risk of stroke. The patient's score is based upon:            Physical Exam:    VS:  BP 130/70   Pulse 79   Ht 5\' 3"  (1.6 m)   SpO2 99%   BMI 40.92 kg/m     Wt Readings from Last  3 Encounters:  03/09/23 231 lb (104.8 kg)  11/12/22 233 lb (105.7 kg)  09/16/22 234 lb 6.4 oz (106.3 kg)     GEN: Well nourished, well developed in no acute distress, obese NECK: No JVD CARDIAC: RRR, 3/6 SEM heard best at RUSB. No rubs, gallops RESPIRATORY:  Clear to auscultation without rales, wheezing or rhonchi  ABDOMEN: Soft, non-tender, non-distended EXTREMITIES:  No edema; No deformity.    ASSESSMENT:    1. Nonrheumatic aortic (valve) stenosis   2. Essential hypertension   3. Obesity, Class III, BMI 40-49.9 (morbid obesity) (HCC)   4. SVT (supraventricular tachycardia) (HCC)   5. ANCA-associated vasculitis (HCC)     PLAN:    In order of problems listed above:  Severe AS: known to have severe AS. Patient inially wanted to continue surveillance, but now ready to proceed with TAVR work up. Will proceed with L/RHC followed by TAVR CTs and a surgical appointment as multidisciplinary approach to her care.   I have reviewed the risks, indications, and alternatives to cardiac catheterization and possible angioplasty/stenting with the patient. Risks include but are not limited to bleeding, infection, vascular injury, stroke, myocardial infection, arrhythmia, kidney injury, radiation-related injury in the case of prolonged fluoroscopy use, emergency cardiac surgery, and death. The patient understands the risks of serious complication is low (<1%).   HTN: BP well controlled today. She is not currently on any antihypertensives.   Morbid obesity: Body mass index is 40.92 kg/m. Work on diet and exercise  SVT: no recent palpitations.   Wegners disease: she and her sister are concerned a possible flare given an ongong cough (after a URI) as well as a recent UTI. She will reach out to Dr. Wynelle Link     Medication Adjustments/Labs and Tests Ordered: Current medicines are reviewed at length with the patient today.  Concerns regarding medicines are outlined above.  Orders Placed This  Encounter  Procedures   CBC   Basic metabolic panel   EKG 12-Lead   No orders of the defined types were placed in this encounter.   Patient Instructions  Medication Instructions:  Your physician recommends that you continue on your current medications as directed. Please refer to the Current Medication list given to you today.  *If you need a refill on your cardiac medications before your next appointment, please call your pharmacy*   Lab Work: BMET. CBC on Friday , January 17. No appointment  needed/ Lab is located on the 1st floor   If you have labs (blood work) drawn today and your tests are completely normal, you will receive your results only by: MyChart Message (if you have MyChart) OR A paper copy in the mail If you have any lab test that is abnormal or we need to change your treatment, we will call you to review the results.   Testing/Procedures: Your physician has requested that you have a cardiac catheterization. Cardiac catheterization is used to diagnose and/or treat various heart conditions. Doctors may recommend this procedure for a number of different reasons. The most common reason is to evaluate chest pain. Chest pain can be a symptom of coronary artery disease (CAD), and cardiac catheterization can show whether plaque is narrowing or blocking your heart's arteries. This procedure is also used to evaluate the valves, as well as measure the blood flow and oxygen levels in different parts of your heart. For further information please visit [email or web address] Please follow instruction sheet, as given.    Follow-Up: We will call you with a follow up appointment   Other Instructions   [facility] A DEPT OF [place]. [facility] AT [facility] [address] [place], [place] [address] [place] [address] Dept: [phone] Loc: [phone]  Cassandra [name]  03/27/2023  You are scheduled for a Cardiac Catheterization on Friday,  January 24 with Dr. Alverda Skeans.  1. Please arrive at the [facility] (Main Entrance A) at [facility]: [address] [place], [place] [address] at 7:00 AM (This time is 2 hour(s) before your procedure to ensure your preparation).   Free valet parking service is available. You will check in at ADMITTING. The support person will be asked to wait in the waiting room.  It is OK to have someone drop you off and come back when you are ready to be discharged.    Special note: Every effort is made to have your procedure done on time. Please understand that emergencies sometimes delay scheduled procedures.  2. Diet: Do not eat solid foods after midnight.  The patient may have clear liquids until 5am upon the day of the procedure.  3. Labs: You will need to have blood drawn on Friday, January 17 at [facility] at [facility]. [address] [place]  Open: 7:30am - 5pm    Phone: [phone]. You do not need to be fasting.  4. Medication instructions in preparation for your procedure:  On the morning of your procedure, take your Aspirin 81 mg and any morning medicines NOT listed above.  You may use sips of water.  5. Plan to go home the same day, you will only stay overnight if medically necessary. 6. Bring a current list of your medications and current insurance cards. 7. You MUST have a responsible person to drive you home. 8. Someone MUST be with you the first 24 hours after you arrive home or your discharge will be delayed. 9. Please wear clothes that are easy to get on and off and wear slip-on shoes.  Thank you for allowing [place] to care for you!   -- [facility] Invasive Cardiovascular services      Signed, Cline Crock, PA-C  03/27/2023 9:36 PM    [facility]

## 2023-03-27 ENCOUNTER — Ambulatory Visit: Payer: Medicare HMO | Attending: Physician Assistant | Admitting: Physician Assistant

## 2023-03-27 VITALS — BP 130/70 | HR 79 | Ht 63.0 in

## 2023-03-27 DIAGNOSIS — I350 Nonrheumatic aortic (valve) stenosis: Secondary | ICD-10-CM | POA: Diagnosis not present

## 2023-03-27 DIAGNOSIS — I10 Essential (primary) hypertension: Secondary | ICD-10-CM | POA: Diagnosis not present

## 2023-03-27 DIAGNOSIS — I471 Supraventricular tachycardia, unspecified: Secondary | ICD-10-CM

## 2023-03-27 DIAGNOSIS — I7782 Antineutrophilic cytoplasmic antibody (ANCA) vasculitis: Secondary | ICD-10-CM

## 2023-03-27 NOTE — Patient Instructions (Addendum)
 Medication Instructions:  Your physician recommends that you continue on your current medications as directed. Please refer to the Current Medication list given to you today.  *If you need a refill on your cardiac medications before your next appointment, please call your pharmacy*   Lab Work: BMET. CBC on Friday , January 17. No appointment needed/ Lab is located on the 1st floor   If you have labs (blood work) drawn today and your tests are completely normal, you will receive your results only by: MyChart Message (if you have MyChart) OR A paper copy in the mail If you have any lab test that is abnormal or we need to change your treatment, we will call you to review the results.   Testing/Procedures: Your physician has requested that you have a cardiac catheterization. Cardiac catheterization is used to diagnose and/or treat various heart conditions. Doctors may recommend this procedure for a number of different reasons. The most common reason is to evaluate chest pain. Chest pain can be a symptom of coronary artery disease (CAD), and cardiac catheterization can show whether plaque is narrowing or blocking your heart's arteries. This procedure is also used to evaluate the valves, as well as measure the blood flow and oxygen levels in different parts of your heart. For further information please visit [email or web address] Please follow instruction sheet, as given.    Follow-Up: We will call you with a follow up appointment   Other Instructions   [facility] A DEPT OF [place]. [facility] AT [facility] [address] [place], [address] [place] [address] Dept: [phone] Loc: [phone]  Cassandra [name]  03/27/2023  You are scheduled for a Cardiac Catheterization on Friday, January 24 with Dr. Lurena Red.  1. Please arrive at the [facility] (Main Entrance A) at [facility]: [address] [place], [place] [address] at 7:00  AM (This time is 2 hour(s) before your procedure to ensure your preparation).   Free valet parking service is available. You will check in at ADMITTING. The support person will be asked to wait in the waiting room.  It is OK to have someone drop you off and come back when you are ready to be discharged.    Special note: Every effort is made to have your procedure done on time. Please understand that emergencies sometimes delay scheduled procedures.  2. Diet: Do not eat solid foods after midnight.  The patient may have clear liquids until 5am upon the day of the procedure.  3. Labs: You will need to have blood drawn on Friday, January 17 at [facility] at [facility]. [address]   Open: 7:30am - 5pm    Phone: [phone]. You do not need to be fasting.  4. Medication instructions in preparation for your procedure:  On the morning of your procedure, take your Aspirin  81 mg and any morning medicines NOT listed above.  You may use sips of water.  5. Plan to go home the same day, you will only stay overnight if medically necessary. 6. Bring a current list of your medications and current insurance cards. 7. You MUST have a responsible person to drive you home. 8. Someone MUST be with you the first 24 hours after you arrive home or your discharge will be delayed. 9. Please wear clothes that are easy to get on and off and wear slip-on shoes.  Thank you for allowing us  to care for  you!   -- [place] Invasive Cardiovascular services

## 2023-03-27 NOTE — Progress Notes (Addendum)
 Pre Surgical Assessment: 5 M Walk Test  84M=16.78ft  5 Meter Walk Test- trial 1: 06.97 seconds 5 Meter Walk Test- trial 2: 05.89 seconds 5 Meter Walk Test- trial 3: 06.55 seconds 5 Meter Walk Test Average: 6.47 seconds   ________________________   Procedure Type: Isolated AVR Perioperative Outcome Estimate % Operative Mortality 2.72% Morbidity & Mortality 9.25% Stroke 1.06% Renal Failure 2.29% Reoperation 2.89% Prolonged Ventilation 5.97% Deep Sternal Wound Infection 0.12% Long Hospital Stay (>14 days) 4.95% Short Hospital Stay (<6 days)* 43.9%

## 2023-04-03 LAB — CBC

## 2023-04-04 LAB — BASIC METABOLIC PANEL
BUN/Creatinine Ratio: 14 (ref 12–28)
BUN: 16 mg/dL (ref 8–27)
CO2: 24 mmol/L (ref 20–29)
Calcium: 9.3 mg/dL (ref 8.7–10.3)
Chloride: 99 mmol/L (ref 96–106)
Creatinine, Ser: 1.11 mg/dL — ABNORMAL HIGH (ref 0.57–1.00)
Glucose: 99 mg/dL (ref 70–99)
Potassium: 4.4 mmol/L (ref 3.5–5.2)
Sodium: 138 mmol/L (ref 134–144)
eGFR: 54 mL/min/{1.73_m2} — ABNORMAL LOW (ref >59)

## 2023-04-04 LAB — CBC
Hematocrit: 40.7 % (ref 34.0–46.6)
Hemoglobin: 13.3 g/dL (ref 11.1–15.9)
MCH: 32.4 pg (ref 26.6–33.0)
MCHC: 32.7 g/dL (ref 31.5–35.7)
MCV: 99 fL — ABNORMAL HIGH (ref 79–97)
Platelets: 194 10*3/uL (ref 150–450)
RBC: 4.1 x10E6/uL (ref 3.77–5.28)
RDW: 13.1 % (ref 11.7–15.4)
WBC: 5 10*3/uL (ref 3.4–10.8)

## 2023-04-09 ENCOUNTER — Encounter: Payer: Self-pay | Admitting: Physician Assistant

## 2023-04-09 ENCOUNTER — Telehealth: Payer: Self-pay | Admitting: *Deleted

## 2023-04-09 ENCOUNTER — Other Ambulatory Visit: Payer: Self-pay | Admitting: Physician Assistant

## 2023-04-09 DIAGNOSIS — I350 Nonrheumatic aortic (valve) stenosis: Secondary | ICD-10-CM

## 2023-04-09 NOTE — Telephone Encounter (Signed)
Cardiac Catheterization scheduled at [facility] for: Friday April 10, 2023 9 AM Arrival time [facility] Main Entrance A at: 7 AM  Nothing to eat after midnight prior to procedure, clear liquids until 5 AM day of procedure.  Medication instructions: -Hold:  Micardis-day before and day of procedure GFR < 60 (54) -Other usual morning medications can be taken with sips of water including aspirin 81 mg.  Plan to go home the same day, you will only stay overnight if medically necessary.  You must have responsible adult to drive you home.  Someone must be with you the first 24 hours after you arrive home.  Left detailed message (DPR) with procedure instructions, call with questions.

## 2023-04-10 ENCOUNTER — Encounter (HOSPITAL_COMMUNITY)
Admission: RE | Disposition: A | Discharge status: Home / Self Care | Payer: Self-pay | Source: Home / Self Care | Attending: Internal Medicine

## 2023-04-10 ENCOUNTER — Ambulatory Visit (HOSPITAL_COMMUNITY)
Admission: RE | Admit: 2023-04-10 | Discharge: 2023-04-10 | Disposition: A | Discharge status: Home / Self Care | Payer: Medicare HMO | Attending: Internal Medicine | Admitting: Internal Medicine

## 2023-04-10 ENCOUNTER — Other Ambulatory Visit: Payer: Self-pay

## 2023-04-10 DIAGNOSIS — Z79899 Other long term (current) drug therapy: Secondary | ICD-10-CM | POA: Diagnosis not present

## 2023-04-10 DIAGNOSIS — I7782 Antineutrophilic cytoplasmic antibody (ANCA) vasculitis: Secondary | ICD-10-CM | POA: Insufficient documentation

## 2023-04-10 DIAGNOSIS — E119 Type 2 diabetes mellitus without complications: Secondary | ICD-10-CM | POA: Insufficient documentation

## 2023-04-10 DIAGNOSIS — Z6841 Body Mass Index (BMI) 40.0 and over, adult: Secondary | ICD-10-CM | POA: Insufficient documentation

## 2023-04-10 DIAGNOSIS — E66813 Obesity, class 3: Secondary | ICD-10-CM | POA: Insufficient documentation

## 2023-04-10 DIAGNOSIS — A5002 Early congenital syphilitic osteochondropathy: Secondary | ICD-10-CM | POA: Insufficient documentation

## 2023-04-10 DIAGNOSIS — I10 Essential (primary) hypertension: Secondary | ICD-10-CM | POA: Diagnosis not present

## 2023-04-10 DIAGNOSIS — Z7982 Long term (current) use of aspirin: Secondary | ICD-10-CM | POA: Diagnosis not present

## 2023-04-10 DIAGNOSIS — I350 Nonrheumatic aortic (valve) stenosis: Secondary | ICD-10-CM | POA: Diagnosis present

## 2023-04-10 DIAGNOSIS — I471 Supraventricular tachycardia, unspecified: Secondary | ICD-10-CM | POA: Insufficient documentation

## 2023-04-10 HISTORY — PX: RIGHT/LEFT HEART CATH AND CORONARY ANGIOGRAPHY: CATH118266

## 2023-04-10 LAB — POCT I-STAT 7, (LYTES, BLD GAS, ICA,H+H)
Acid-Base Excess: 1 mmol/L (ref 0.0–2.0)
Bicarbonate: 26.4 mmol/L (ref 20.0–28.0)
Calcium, Ion: 1.17 mmol/L (ref 1.15–1.40)
HCT: 33 % — ABNORMAL LOW (ref 36.0–46.0)
Hemoglobin: 11.2 g/dL — ABNORMAL LOW (ref 12.0–15.0)
O2 Saturation: 92 %
Potassium: 4.1 mmol/L (ref 3.5–5.1)
Sodium: 140 mmol/L (ref 135–145)
TCO2: 28 mmol/L (ref 22–32)
pCO2 arterial: 46.4 mm[Hg] (ref 32–48)
pH, Arterial: 7.364 (ref 7.35–7.45)
pO2, Arterial: 66 mm[Hg] — ABNORMAL LOW (ref 83–108)

## 2023-04-10 LAB — POCT I-STAT EG7
Acid-base deficit: 1 mmol/L (ref 0.0–2.0)
Acid-base deficit: 1 mmol/L (ref 0.0–2.0)
Bicarbonate: 24.9 mmol/L (ref 20.0–28.0)
Bicarbonate: 25.1 mmol/L (ref 20.0–28.0)
Calcium, Ion: 0.96 mmol/L — ABNORMAL LOW (ref 1.15–1.40)
Calcium, Ion: 0.98 mmol/L — ABNORMAL LOW (ref 1.15–1.40)
HCT: 30 % — ABNORMAL LOW (ref 36.0–46.0)
HCT: 31 % — ABNORMAL LOW (ref 36.0–46.0)
Hemoglobin: 10.2 g/dL — ABNORMAL LOW (ref 12.0–15.0)
Hemoglobin: 10.5 g/dL — ABNORMAL LOW (ref 12.0–15.0)
O2 Saturation: 68 %
O2 Saturation: 71 %
Potassium: 3.6 mmol/L (ref 3.5–5.1)
Potassium: 3.6 mmol/L (ref 3.5–5.1)
Sodium: 144 mmol/L (ref 135–145)
Sodium: 144 mmol/L (ref 135–145)
TCO2: 26 mmol/L (ref 22–32)
TCO2: 26 mmol/L (ref 22–32)
pCO2, Ven: 46.4 mm[Hg] (ref 44–60)
pCO2, Ven: 46.5 mm[Hg] (ref 44–60)
pH, Ven: 7.337 (ref 7.25–7.43)
pH, Ven: 7.341 (ref 7.25–7.43)
pO2, Ven: 38 mm[Hg] (ref 32–45)
pO2, Ven: 40 mm[Hg] (ref 32–45)

## 2023-04-10 LAB — GLUCOSE, CAPILLARY
Glucose-Capillary: 100 mg/dL — ABNORMAL HIGH (ref 70–99)
Glucose-Capillary: 79 mg/dL (ref 70–99)

## 2023-04-10 SURGERY — RIGHT/LEFT HEART CATH AND CORONARY ANGIOGRAPHY
Anesthesia: LOCAL

## 2023-04-10 MED ORDER — FENTANYL CITRATE (PF) 100 MCG/2ML IJ SOLN
INTRAMUSCULAR | Status: DC | PRN
  Administered 2023-04-10 (×2): 25 ug via INTRAVENOUS

## 2023-04-10 MED ORDER — IOHEXOL 350 MG/ML SOLN
INTRAVENOUS | Status: DC | PRN
  Administered 2023-04-10: 35 mL

## 2023-04-10 MED ORDER — FENTANYL CITRATE (PF) 100 MCG/2ML IJ SOLN
INTRAMUSCULAR | Status: AC
  Filled 2023-04-10: qty 2

## 2023-04-10 MED ORDER — SODIUM CHLORIDE 0.9 % IV SOLN
INTRAVENOUS | Status: DC | PRN
  Administered 2023-04-10: 10 mL/h via INTRAVENOUS

## 2023-04-10 MED ORDER — SODIUM CHLORIDE 0.9 % WEIGHT BASED INFUSION: 1.0000 mL/kg/h | INTRAVENOUS | Status: DC

## 2023-04-10 MED ORDER — HEPARIN SODIUM (PORCINE) 1000 UNIT/ML IJ SOLN
INTRAMUSCULAR | Status: DC | PRN
  Administered 2023-04-10: 5000 [IU] via INTRA_ARTERIAL

## 2023-04-10 MED ORDER — HEPARIN SODIUM (PORCINE) 1000 UNIT/ML IJ SOLN
INTRAMUSCULAR | Status: AC
  Filled 2023-04-10: qty 10

## 2023-04-10 MED ORDER — VERAPAMIL HCL 2.5 MG/ML IV SOLN
INTRAVENOUS | Status: DC | PRN
  Administered 2023-04-10: 10 mL via INTRA_ARTERIAL

## 2023-04-10 MED ORDER — SODIUM CHLORIDE 0.9 % WEIGHT BASED INFUSION: 3.0000 mL/kg/h | INTRAVENOUS | Status: AC

## 2023-04-10 MED ORDER — VERAPAMIL HCL 2.5 MG/ML IV SOLN
INTRAVENOUS | Status: AC
  Filled 2023-04-10: qty 2

## 2023-04-10 MED ORDER — ASPIRIN 81 MG PO CHEW: 81.0000 mg | CHEWABLE_TABLET | ORAL | Status: DC

## 2023-04-10 MED ORDER — HEPARIN (PORCINE) IN NACL 1000-0.9 UT/500ML-% IV SOLN
INTRAVENOUS | Status: DC | PRN
  Administered 2023-04-10 (×2): 500 mL

## 2023-04-10 MED ORDER — LIDOCAINE HCL (PF) 1 % IJ SOLN
INTRAMUSCULAR | Status: DC | PRN
  Administered 2023-04-10: 5 mL

## 2023-04-10 MED ORDER — MIDAZOLAM HCL 2 MG/2ML IJ SOLN
INTRAMUSCULAR | Status: DC | PRN
  Administered 2023-04-10 (×2): 1 mg via INTRAVENOUS

## 2023-04-10 MED ORDER — MIDAZOLAM HCL 2 MG/2ML IJ SOLN
INTRAMUSCULAR | Status: AC
  Filled 2023-04-10: qty 2

## 2023-04-10 MED ORDER — LIDOCAINE HCL (PF) 1 % IJ SOLN
INTRAMUSCULAR | Status: AC
  Filled 2023-04-10: qty 30

## 2023-04-10 SURGICAL SUPPLY — 16 items
CATH INFINITI 5 FR JL3.5 (CATHETERS) IMPLANT
CATH INFINITI 5FR ANG PIGTAIL (CATHETERS) IMPLANT
CATH INFINITI AMBI 6FR TG (CATHETERS) IMPLANT
CATH LAUNCHER 5F EBU3.5 (CATHETERS) IMPLANT
CATH SWAN GANZ 7F STRAIGHT (CATHETERS) IMPLANT
DEVICE RAD COMP TR BAND LRG (VASCULAR PRODUCTS) IMPLANT
GLIDESHEATH SLEND SS 6F .021 (SHEATH) IMPLANT
GLIDESHEATH SLENDER 7FR .021G (SHEATH) IMPLANT
PACK CARDIAC CATHETERIZATION (CUSTOM PROCEDURE TRAY) ×1 IMPLANT
PROTECTION STATION PRESSURIZED (MISCELLANEOUS) ×1 IMPLANT
SET ATX-X65L (MISCELLANEOUS) IMPLANT
SHEATH GLIDE SLENDER 4/5FR (SHEATH) IMPLANT
SHEATH PROBE COVER 6X72 (BAG) IMPLANT
STATION PROTECTION PRESSURIZED (MISCELLANEOUS) IMPLANT
WIRE EMERALD 3MM-J .025X260CM (WIRE) IMPLANT
WIRE EMERALD 3MM-J .035X260CM (WIRE) IMPLANT

## 2023-04-10 NOTE — Interval H&P Note (Signed)
History and Physical Interval Note:  04/10/2023 10:49 AM  Cassandra [name]  has presented today for surgery, with the diagnosis of aortic stenosis.  The various methods of treatment have been discussed with the patient and family. After consideration of risks, benefits and other options for treatment, the patient has consented to  Procedure(s): RIGHT/LEFT HEART CATH AND CORONARY ANGIOGRAPHY (N/A) as a surgical intervention.  The patient's history has been reviewed, patient examined, no change in status, stable for surgery.  I have reviewed the patient's chart and labs.  Questions were answered to the patient's satisfaction.     Orbie Pyo

## 2023-04-13 ENCOUNTER — Encounter (HOSPITAL_COMMUNITY): Payer: Self-pay | Admitting: Internal Medicine

## 2023-04-24 ENCOUNTER — Ambulatory Visit (HOSPITAL_COMMUNITY)
Admission: RE | Admit: 2023-04-24 | Discharge: 2023-04-24 | Disposition: A | Discharge status: Home / Self Care | Payer: Medicare HMO | Source: Ambulatory Visit | Attending: Cardiology | Admitting: Cardiology

## 2023-04-24 DIAGNOSIS — I350 Nonrheumatic aortic (valve) stenosis: Secondary | ICD-10-CM | POA: Diagnosis present

## 2023-04-24 MED ORDER — IOHEXOL 350 MG/ML SOLN
95.0000 mL | Freq: Once | INTRAVENOUS | Status: AC | PRN
  Administered 2023-04-24: 95 mL via INTRAVENOUS

## 2023-04-24 MED ORDER — NITROGLYCERIN 0.4 MG SL SUBL
SUBLINGUAL_TABLET | SUBLINGUAL | Status: AC
  Filled 2023-04-24: qty 2

## 2023-05-13 NOTE — Progress Notes (Unsigned)
 [address]       [place] [address]             [phone]           SEJAL COFIELD [facility] Medical Record #[id number] Date of Birth: 01-13-1956  [name], Cassandra Ping, MD Carren Rang, PA-C  Chief Complaint:   Increasing DOE  History of Present Illness:     Pt is a 68 yo female who was evaluated last August by Dr Excell Seltzer for paradoxical LFLG severe AS and due to lack of significant symptoms was felt to be monitored with repeat evaluation in 6 months. Pt however in December after having orthopedic consultation for Left hand surgery, was told to have her valve procedure prior to her hand surgery. She asked to have her TAVR evaluation moved along and she was seen by the TAVR team and her work up with cath and CTA were scheduled. Pt had by cath no significant  CAD and her CTA had femoral access for TAVR. She was felt best by her BSA and age to benefit most from a medtronic valve as prosthesis. Pt and her sister confirms that her symptoms have progressed some from last August with increasing DOE with less activity. She has no CP or lightheadedness. 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       Past Medical History:  Diagnosis Date   Allergy    Anxiety    Complication of anesthesia    difficulty waking up   Depression    Dysrhythmia    svt   GERD (gastroesophageal reflux disease)    Heart murmur    never seen cardiologist for this   Hypertension    Leukocytoclastic vasculitis (HCC)    Dr. Marchelle Gearing and Dr. 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   Verrucae vulgaris    Wegner's disease (congenital syphilitic osteochondritis)     Past Surgical History:  Procedure Laterality Date   BUNIONECTOMY  Bilateral    CARPAL TUNNEL RELEASE Right 2002   JOINT REPLACEMENT Right    knee   LOWER EXTREMITY VENOGRAPHY Right 07/14/2016   Procedure: Lower Extremity Venography;  Surgeon: Maeola Harman, MD;  Location: [facility] INVASIVE CV LAB;  Service: Cardiovascular;  Laterality: Right;   PERCUTANEOUS VENOUS THROMBECTOMY,LYSIS WITH INTRAVASCULAR ULTRASOUND (IVUS) Right 07/17/2016   Procedure: PERCUTANEOUS VENOUS THROMBECTOMY AND LYSIS WITH INTRAVASCULAR ULTRASOUND (IVUS) of right lower extermity with balloon angioplasty;  Surgeon: Maeola Harman, MD;  Location: [facility] OR;  Service: Vascular;  Laterality: Right;   RIGHT/LEFT HEART CATH AND CORONARY ANGIOGRAPHY N/A 04/10/2023   Procedure: RIGHT/LEFT HEART CATH AND CORONARY ANGIOGRAPHY;  Surgeon: Orbie Pyo, MD;  Location: [facility] INVASIVE CV LAB;  Service: Cardiovascular;  Laterality: N/A;   SVT ABLATION N/A 02/20/2017   Procedure: SVT ABLATION;  Surgeon: Marinus Maw, MD;  Location: [facility] INVASIVE CV LAB;  Service: Cardiovascular;  Laterality: N/A;   TONSILLECTOMY  08/2002   TOTAL KNEE ARTHROPLASTY Right    TOTAL KNEE ARTHROPLASTY Left 02/21/2014   Procedure: LEFT TOTAL KNEE ARTHROPLASTY;  Surgeon: Velna Ochs, MD;  Location: [facility] OR;  Service: Orthopedics;  Laterality: Left;  TUBAL LIGATION  1980   ULTRASOUND GUIDANCE FOR VASCULAR ACCESS Right 07/17/2016   Procedure: ULTRASOUND GUIDANCE FOR VASCULAR ACCESS;  Surgeon: Maeola Harman, MD;  Location[facility] OR;  Service: Vascular;  Laterality: Right;   VENOGRAM Right 07/17/2016   Procedure: right ASCENDING VENOGRAM WITH ANGIOJET;  Surgeon: Maeola Harman, MD;  Location: [facility] OR;  Service: Vascular;  Laterality: Right;    Social History   Tobacco Use  Smoking Status Never   Passive exposure: Never  Smokeless Tobacco Never    Social History   Substance and Sexual Activity  Alcohol Use No    Social History   Socioeconomic History   Marital status: Divorced    Spouse name:  Not on file   Number of children: Not on file   Years of education: Not on file   Highest education level: Not on file  Occupational History   Occupation: Unemployed  Tobacco Use   Smoking status: Never    Passive exposure: Never   Smokeless tobacco: Never  Substance and Sexual Activity   Alcohol use: No   Drug use: No   Sexual activity: Not on file  Other Topics Concern   Not on file  Social History Narrative   Not on file   Social Drivers of Health   Financial Resource Strain: Medium Risk (12/22/2022)   Received from [facility]   Overall Financial Resource Strain (CARDIA)    Difficulty of Paying Living Expenses: Somewhat hard  Food Insecurity: Food Insecurity Present (12/22/2022)   Received from [facility]   Hunger Vital Sign    Worried About Running Out of Food in the Last Year: Sometimes true    Ran Out of Food in the Last Year: Sometimes true  Transportation Needs: No Transportation Needs (12/22/2022)   Received from [facility] - Transportation    In the past 12 months, has lack of transportation kept you from medical appointments or from getting medications?: No    Lack of Transportation (Non-Medical): No  Physical Activity: Not on file  Stress: Not on file  Social Connections: Not on file  Intimate Partner Violence: Not on file    Allergies  Allergen Reactions   Nitrofurantoin Itching and Shortness Of Breath   Effexor [Venlafaxine] Hives   Myrbetriq [Mirabegron] Other (See Comments)    BACK PAIN DRY MOUTH   Nsaids Other (See Comments) and Hives    Hypersensitivity vasculitis VASCULITIS   Reglan [Metoclopramide] Other (See Comments)    Blister in mouth    Clarithromycin     Eyes and mouth irritation    Flagyl [Metronidazole] Hives   Ketoprofen Itching    Irritates eyes    Levofloxacin     Unknown reaction    Paxil [Paroxetine]     Deep itch   Tolterodine Other (See Comments)    Unknown  reaction    Ampicillin Rash   Cholecalciferol Rash    In high doses    Codeine Other (See Comments)    Head feels like its crawling   Fluoxetine Hcl Itching    Deep Itch    Sulfa Antibiotics Nausea And Vomiting and Other (See Comments)    Stomach irritation    Tetracycline Nausea And Vomiting    Severe stomach pain    Current Outpatient Medications  Medication Sig Dispense Refill   albuterol (VENTOLIN HFA) 108 (90 Base) MCG/ACT inhaler Inhale 2 puffs into the lungs every 6 (six) hours as  needed for wheezing or shortness of breath.     alendronate (FOSAMAX) 70 MG tablet Take 70 mg by mouth every Sunday. Take with a full glass of water on an empty stomach.     atorvastatin (LIPITOR) 20 MG tablet Take 20 mg by mouth daily.     cetirizine (ZYRTEC) 10 MG tablet Take 10 mg by mouth daily.     CHOLECALCIFEROL PO Take 1 capsule by mouth daily.     escitalopram (LEXAPRO) 20 MG tablet Take 20 mg by mouth daily.     ipratropium (ATROVENT) 0.06 % nasal spray Place 2 sprays into both nostrils 2 (two) times daily as needed for rhinitis.     ketoconazole (NIZORAL) 2 % shampoo Apply 1 Application topically 3 (three) times a week. Wash scalp 3 time weekly, let sit 5 minutes and rinse out (Patient taking differently: Apply 1 Application topically every other day.) 120 mL 11   montelukast (SINGULAIR) 10 MG tablet Take 10 mg by mouth at bedtime.     Multiple Vitamin (MULTI-VITAMIN) tablet Take 1 tablet by mouth daily.     pantoprazole (PROTONIX) 40 MG tablet Take 40 mg by mouth daily.     telmisartan (MICARDIS) 20 MG tablet Take 20 mg by mouth daily.     No current facility-administered medications for this visit.     Family History  Problem Relation Age of Onset   Clotting disorder Mother 60       Blood clot, foot amputation   Cervical cancer Mother    Arthritis Sister    Heart attack Maternal Grandmother 95   Breast cancer Sister 30   Asthma Cousin        Physical Exam: Morbidly  obese Lungs: clear Card: RR with 3/6 sem Ext: no edema Neuro: alert and oriented     Diagnostic Studies & Laboratory data: I have personally reviewed the following studies and agree with the findings   TTE (10/2022) IMPRESSIONS     1. Left ventricular ejection fraction, by estimation, is 55 to 60%. The  left ventricle has normal function. The left ventricle has no regional  wall motion abnormalities. Left ventricular diastolic parameters are  consistent with Grade I diastolic  dysfunction (impaired relaxation). The average left ventricular global  longitudinal strain is -18.7 %. The global longitudinal strain is normal.   2. Right ventricular systolic function is normal. The right ventricular  size is normal. There is normal pulmonary artery systolic pressure. The  estimated right ventricular systolic pressure is 31.7 mmHg.   3. Left atrial size was mildly dilated.   4. The mitral valve is normal in structure. Trivial mitral valve  regurgitation. No evidence of mitral stenosis.   5. The aortic valve is tricuspid. There is severe calcifcation of the  aortic valve. Aortic valve regurgitation is trivial. Paradoxical low  flow/low gradient severe aortic valve stenosis. Aortic valve area, by VTI  measures 0.73 cm. Aortic valve mean  gradient measures 31.0 mmHg.   6. The inferior vena cava is normal in size with greater than 50%  respiratory variability, suggesting right atrial pressure of 3 mmHg.   FINDINGS   Left Ventricle: Left ventricular ejection fraction, by estimation, is 55  to 60%. The left ventricle has normal function. The left ventricle has no  regional wall motion abnormalities. The average left ventricular global  longitudinal strain is -18.7 %.  The global longitudinal strain is normal. The left ventricular internal  cavity size was normal in size. There is no  left ventricular hypertrophy.  Left ventricular diastolic parameters are consistent with Grade I  diastolic  dysfunction (impaired  relaxation).   Right Ventricle: The right ventricular size is normal. No increase in  right ventricular wall thickness. Right ventricular systolic function is  normal. There is normal pulmonary artery systolic pressure. The tricuspid  regurgitant velocity is 2.68 m/s, and   with an assumed right atrial pressure of 3 mmHg, the estimated right  ventricular systolic pressure is 31.7 mmHg.   Left Atrium: Left atrial size was mildly dilated.   Right Atrium: Right atrial size was normal in size.   Pericardium: There is no evidence of pericardial effusion.   Mitral Valve: The mitral valve is normal in structure. Trivial mitral  valve regurgitation. No evidence of mitral valve stenosis.   Tricuspid Valve: The tricuspid valve is normal in structure. Tricuspid  valve regurgitation is mild.   Aortic Valve: The aortic valve is tricuspid. There is severe calcifcation  of the aortic valve. Aortic valve regurgitation is trivial. Aortic  regurgitation PHT measures 381 msec. Severe aortic stenosis is present.  Aortic valve mean gradient measures 31.0  mmHg. Aortic valve peak gradient measures 57.8 mmHg. Aortic valve area, by  VTI measures 0.73 cm.   Pulmonic Valve: The pulmonic valve was normal in structure. Pulmonic valve  regurgitation is trivial.   Aorta: The aortic root is normal in size and structure.   Venous: The inferior vena cava is normal in size with greater than 50%  respiratory variability, suggesting right atrial pressure of 3 mmHg.   IAS/Shunts: No atrial level shunt detected by color flow Doppler.     LEFT VENTRICLE  PLAX 2D  LVIDd:         4.15 cm   Diastology  LVIDs:         2.55 cm   LV e' medial:    5.98 cm/s  LV PW:         0.95 cm   LV E/e' medial:  14.1  LV IVS:        1.10 cm   LV e' lateral:   7.18 cm/s  LVOT diam:     2.10 cm   LV E/e' lateral: 11.7  LV SV:         67  LV SV Index:   32        2D Longitudinal Strain  LVOT Area:      3.46 cm  2D Strain GLS (A2C):   -17.8 %                           2D Strain GLS (A3C):   -16.3 %                           2D Strain GLS (A4C):   -22.2 %                           2D Strain GLS Avg:     -18.7 %                             3D Volume EF:                           3D EF:        50 %  LV EDV:       125 ml                           LV ESV:       62 ml                           LV SV:        63 ml   RIGHT VENTRICLE  RV Basal diam:  3.70 cm  RV S prime:     16.90 cm/s  TAPSE (M-mode): 2.0 cm  RVSP:           31.7 mmHg   LEFT ATRIUM             Index        RIGHT ATRIUM           Index  LA diam:        4.50 cm 2.18 cm/m   RA Pressure: 3.00 mmHg  LA Vol (A2C):   41.4 ml 20.01 ml/m  RA Area:     14.90 cm  LA Vol (A4C):   69.1 ml 33.40 ml/m  RA Volume:   39.10 ml  18.90 ml/m  LA Biplane Vol: 55.7 ml 26.93 ml/m   AORTIC VALVE  AV Area (Vmax):    0.74 cm  AV Area (Vmean):   0.81 cm  AV Area (VTI):     0.73 cm  AV Vmax:           380.00 cm/s  AV Vmean:          245.200 cm/s  AV VTI:            0.909 m  AV Peak Grad:      57.8 mmHg  AV Mean Grad:      31.0 mmHg  LVOT Vmax:         81.13 cm/s  LVOT Vmean:        57.100 cm/s  LVOT VTI:          0.192 m  LVOT/AV VTI ratio: 0.21  AI PHT:            381 msec    AORTA  Ao Root diam: 2.50 cm  Ao Asc diam:  2.80 cm   MITRAL VALVE                TRICUSPID VALVE  MV Area (PHT): cm          TR Peak grad:   28.7 mmHg  MV Decel Time: 217 msec     TR Vmax:        268.00 cm/s  MV E velocity: 84.15 cm/s   Estimated RAP:  3.00 mmHg  MV A velocity: 105.00 cm/s  RVSP:           31.7 mmHg  MV E/A ratio:  0.80                              SHUNTS                              Systemic VTI:  0.19 m                              Systemic Diam: 2.10  cm   Cath (03/2023) Conclusion  1.  Normal right dominant coronary circulation. 2.  Fick cardiac output of 7.2 L removed per minute and Fick cardiac index  of 3.5 L/min/m with the following hemodynamics:            Right atrial pressure mean of 10 mmHg            Right ventricular pressure 38/5 with an end-diastolic pressure of 14 mmHg            Wedge pressure mean of 15 mmHg with V waves to 19 mmHg            PA pressure 38/15 with a mean of 26 mmHg            PA pulsatility index of 3.2            PVR of 1.5    Recent Radiology Findings:   CTA (04/2023) FINDINGS: Average image quality.  Reduced contrast to noise ratio.   Aortic Valve:   Tricuspid aortic valve with severely reduced cusp excursion. Severely thickened and moderate-severely calcified aortic valve cusps.   AV calcium score: 779   Virtual Basal Annulus Measurements:   Maximum/Minimum Diameter: 25.1 x 20.8 mm   Perimeter: 71.3 mm   Area: 390 mm2   No significant LVOT calcifications.   Membranous septal length: 6 mm   Based on these measurements, the annulus would be suitable for a 23 mm Sapien 3 valve. Alternatively, Heart Team can consider 26 mm Evolut valve. Recommend Heart Team discussion for valve selection.   Sinus of Valsalva Measurements:   Non-coronary:  27 mm   Right - coronary:  27 mm   Left - coronary:  28 mm   Sinus of Valsalva Height:   Left: 21.4 mm   Right: 21.5 mm   Aorta: Common origin of the brachiocephalic and left common carotid arteries. Separate ostia of the left vertebral artery off aortic arch.   Sinotubular Junction:  26 mm   Ascending Thoracic Aorta:  32 mm   Aortic Arch:  25 mm   Descending Thoracic Aorta:  23 mm   Coronary Artery Height above Annulus:   Left main: 17.1 mm   Right coronary: 16.5 mm   Coronary Arteries: Normal coronary origin. Right dominance. The study was performed without use of NTG and insufficient for plaque evaluation. Coronary artery calcium seen in the proximal LAD, calcium score 66, which is 71st percentile for age and sex.   Optimum Fluoroscopic Angle for Delivery: LAO 1 CAU 1    OTHER:   Atria: Biatrial dilation   Left atrial appendage: No thrombus.   Mitral valve: Grossly normal, no mitral annular calcifications.   Pulmonary artery: Normal caliber.   Pulmonary veins: Normal anatomy.   IMPRESSION: 1. Tricuspid aortic valve with severely reduced cusp excursion. Severely thickened and moderate-severely calcified aortic valve cusps. 2. Aortic valve calcium score: 779 3. Annulus area: 390 mm2, suitable for 23 mm Sapien 3 valve. No LVOT calcifications. Membranous septal length 6 mm. 4. Sufficient coronary artery heights from annulus. 5. Optimum fluoroscopic angle for delivery: LAO 1 CAU 1      Recent Lab Findings: Lab Results  Component Value Date   WBC 5.0 04/03/2023   HGB 10.5 (L) 04/10/2023   HCT 31.0 (L) 04/10/2023   PLT 194 04/03/2023   GLUCOSE 99 04/03/2023   ALT 18 03/09/2023   AST 22 03/09/2023   NA 144 04/10/2023   K 3.6 04/10/2023  CL 99 04/03/2023   CREATININE 1.11 (H) 04/03/2023   BUN 16 04/03/2023   CO2 24 04/03/2023   TSH 1.161 04/15/2016   INR 1.10 07/17/2016      Assessment / Plan:     68 yo female with NYHA class 1-2 symptoms of severe AS with normal LV function and no CAD with a valve area of 0.81 cm2  and a mean gradient of . She has a class I indication for AVR and secondary to her comorbidities of obesity and Wegner's would best be served with TAVR (medtronic valve via femoral access). She understands all the risks, goals, recovery from TAVR and she wishes to proceed. She would be a bailout candidate   I have spent 60 min in review of the records, viewing studies and in face to face with patient and in coordination of future care    Eugenio Hoes 05/13/2023 6:16 PM

## 2023-05-14 ENCOUNTER — Encounter: Payer: Self-pay | Admitting: Thoracic Surgery (Cardiothoracic Vascular Surgery)

## 2023-05-14 ENCOUNTER — Institutional Professional Consult (permissible substitution): Payer: Medicare HMO | Admitting: Thoracic Surgery (Cardiothoracic Vascular Surgery)

## 2023-05-14 VITALS — BP 123/75 | HR 79 | Resp 20 | Ht 63.0 in | Wt 233.0 lb

## 2023-05-14 DIAGNOSIS — I350 Nonrheumatic aortic (valve) stenosis: Secondary | ICD-10-CM

## 2023-05-14 NOTE — Patient Instructions (Signed)
 TAVR

## 2023-05-20 ENCOUNTER — Other Ambulatory Visit: Payer: Self-pay

## 2023-05-20 DIAGNOSIS — I350 Nonrheumatic aortic (valve) stenosis: Secondary | ICD-10-CM

## 2023-05-29 ENCOUNTER — Encounter (HOSPITAL_COMMUNITY)
Admission: RE | Admit: 2023-05-29 | Discharge: 2023-05-29 | Disposition: A | Discharge status: Home / Self Care | Source: Ambulatory Visit | Attending: Cardiovascular Disease | Admitting: Cardiovascular Disease

## 2023-05-29 ENCOUNTER — Other Ambulatory Visit: Payer: Self-pay

## 2023-05-29 ENCOUNTER — Ambulatory Visit (HOSPITAL_COMMUNITY)
Admission: RE | Admit: 2023-05-29 | Discharge: 2023-05-29 | Disposition: A | Discharge status: Home / Self Care | Source: Ambulatory Visit | Attending: Cardiovascular Disease | Admitting: Cardiovascular Disease

## 2023-05-29 DIAGNOSIS — I350 Nonrheumatic aortic (valve) stenosis: Secondary | ICD-10-CM | POA: Diagnosis not present

## 2023-05-29 DIAGNOSIS — Z01818 Encounter for other preprocedural examination: Secondary | ICD-10-CM | POA: Diagnosis present

## 2023-05-29 LAB — URINALYSIS, ROUTINE W REFLEX MICROSCOPIC
Bilirubin Urine: NEGATIVE
Glucose, UA: NEGATIVE mg/dL
Hgb urine dipstick: NEGATIVE
Ketones, ur: NEGATIVE mg/dL
Leukocytes,Ua: NEGATIVE
Nitrite: NEGATIVE
Protein, ur: NEGATIVE mg/dL
Specific Gravity, Urine: 1.016 (ref 1.005–1.030)
pH: 6 (ref 5.0–8.0)

## 2023-05-29 LAB — TYPE AND SCREEN
ABO/RH(D): A NEG
Antibody Screen: NEGATIVE

## 2023-05-29 LAB — CBC
HCT: 38.3 % (ref 36.0–46.0)
Hemoglobin: 13 g/dL (ref 12.0–15.0)
MCH: 33 pg (ref 26.0–34.0)
MCHC: 33.9 g/dL (ref 30.0–36.0)
MCV: 97.2 fL (ref 80.0–100.0)
Platelets: 190 10*3/uL (ref 150–400)
RBC: 3.94 MIL/uL (ref 3.87–5.11)
RDW: 13 % (ref 11.5–15.5)
WBC: 5.7 10*3/uL (ref 4.0–10.5)
nRBC: 0 % (ref 0.0–0.2)

## 2023-05-29 LAB — COMPREHENSIVE METABOLIC PANEL
ALT: 29 U/L (ref 0–44)
AST: 34 U/L (ref 15–41)
Albumin: 3.6 g/dL (ref 3.5–5.0)
Alkaline Phosphatase: 84 U/L (ref 38–126)
Anion gap: 9 (ref 5–15)
BUN: 16 mg/dL (ref 8–23)
CO2: 26 mmol/L (ref 22–32)
Calcium: 9.3 mg/dL (ref 8.9–10.3)
Chloride: 104 mmol/L (ref 98–111)
Creatinine, Ser: 1.09 mg/dL — ABNORMAL HIGH (ref 0.44–1.00)
GFR, Estimated: 55 mL/min — ABNORMAL LOW (ref >60)
Glucose, Bld: 112 mg/dL — ABNORMAL HIGH (ref 70–99)
Potassium: 4.1 mmol/L (ref 3.5–5.1)
Sodium: 139 mmol/L (ref 135–145)
Total Bilirubin: 0.8 mg/dL (ref 0.0–1.2)
Total Protein: 7.4 g/dL (ref 6.5–8.1)

## 2023-05-29 LAB — PROTIME-INR
INR: 1 (ref 0.8–1.2)
Prothrombin Time: 13.1 s (ref 11.4–15.2)

## 2023-05-29 LAB — SURGICAL PCR SCREEN
MRSA, PCR: POSITIVE — AB
Staphylococcus aureus: POSITIVE — AB

## 2023-05-29 NOTE — Progress Notes (Signed)
 Patient signed all consents at PAT lab appointment. CHG soap and instructions were given to patient. CHG surgical prep reviewed with patient and all questions answered.  Patients chart send to anesthesia for review. Pt denies any respiratory illness/infection in the last two months.

## 2023-06-01 ENCOUNTER — Encounter (HOSPITAL_COMMUNITY): Payer: Self-pay | Admitting: Cardiovascular Disease

## 2023-06-01 MED ORDER — CEFAZOLIN SODIUM-DEXTROSE 2-4 GM/100ML-% IV SOLN
2.0000 g | INTRAVENOUS | Status: AC
  Administered 2023-06-02: 2 g via INTRAVENOUS
  Filled 2023-06-01: qty 100

## 2023-06-01 MED ORDER — POTASSIUM CHLORIDE 2 MEQ/ML IV SOLN
80.0000 meq | INTRAVENOUS | Status: DC
  Filled 2023-06-01 (×2): qty 40

## 2023-06-01 MED ORDER — NOREPINEPHRINE 4 MG/250ML-% IV SOLN
0.0000 ug/min | INTRAVENOUS | Status: DC
  Filled 2023-06-01: qty 250

## 2023-06-01 MED ORDER — HEPARIN 30,000 UNITS/1000 ML (OHS) CELLSAVER SOLUTION
Status: DC
  Filled 2023-06-01 (×2): qty 1000

## 2023-06-01 MED ORDER — MAGNESIUM SULFATE 50 % IJ SOLN
40.0000 meq | INTRAMUSCULAR | Status: DC
  Filled 2023-06-01 (×2): qty 9.85

## 2023-06-01 MED ORDER — DEXMEDETOMIDINE HCL IN NACL 400 MCG/100ML IV SOLN
0.1000 ug/kg/h | INTRAVENOUS | Status: AC
  Administered 2023-06-02: 105.72 ug via INTRAVENOUS
  Administered 2023-06-02: 1 ug/kg/h via INTRAVENOUS
  Filled 2023-06-01: qty 100

## 2023-06-01 NOTE — H&P (Signed)
 [address]       [place] [address]             [phone]                                   Cassandra [name] [facility] Medical Record #[id number] Date of Birth: Jan 09, 1956   [name], Cassandra Ping, MD Carren Rang, PA-C   Chief Complaint:   Increasing DOE   History of Present Illness:     Pt is a 68 yo female who was evaluated last August by Dr Excell Seltzer for paradoxical LFLG severe AS and due to lack of significant symptoms was felt to be monitored with repeat evaluation in 6 months. Pt however in December after having orthopedic consultation for Left hand surgery, was told to have her valve procedure prior to her hand surgery. She asked to have her TAVR evaluation moved along and she was seen by the TAVR team and her work up with cath and CTA were scheduled. Pt had by cath no significant  CAD and her CTA had femoral access for TAVR. She was felt best by her BSA and age to benefit most from a medtronic valve as prosthesis. Pt and her sister confirms that her symptoms have progressed some from last August with increasing DOE with less activity. She has no CP or lightheadedness. 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              Past Medical History:  Diagnosis Date   Allergy     Anxiety     Complication of anesthesia      difficulty waking up   Depression     Dysrhythmia      svt   GERD (gastroesophageal reflux disease)     Heart murmur      never seen cardiologist for this   Hypertension     Leukocytoclastic vasculitis (HCC)      Dr. Marchelle Gearing and Dr. Corliss Skains   Migraines      maybe one a year   Renal disorder     Renal insufficiency     Sleep apnea      cpap   SVT (supraventricular tachycardia) (HCC)     Type 2 diabetes mellitus with kidney complication, without long-term current use of insulin (HCC) 05/07/2016   Verrucae vulgaris     Wegner's disease (congenital syphilitic osteochondritis)                  Past Surgical History:  Procedure Laterality Date   BUNIONECTOMY Bilateral     CARPAL TUNNEL RELEASE Right 2002   JOINT REPLACEMENT Right      knee   LOWER EXTREMITY VENOGRAPHY Right 07/14/2016    Procedure: Lower Extremity Venography;  Surgeon: Maeola Harman, MD;  Location: [facility] INVASIVE CV LAB;  Service: Cardiovascular;  Laterality: Right;   PERCUTANEOUS VENOUS THROMBECTOMY,LYSIS WITH INTRAVASCULAR ULTRASOUND (IVUS) Right 07/17/2016    Procedure: PERCUTANEOUS VENOUS THROMBECTOMY AND LYSIS WITH INTRAVASCULAR ULTRASOUND (IVUS) of right lower extermity with balloon angioplasty;  Surgeon: Maeola Harman, MD;  Location: [facility] OR;  Service: Vascular;  Laterality: Right;   RIGHT/LEFT HEART CATH AND CORONARY ANGIOGRAPHY N/A 04/10/2023    Procedure: RIGHT/LEFT HEART CATH AND CORONARY ANGIOGRAPHY;  Surgeon: Orbie Pyo, MD;  Location: [facility] INVASIVE CV LAB;  Service: Cardiovascular;  Laterality: N/A;   SVT ABLATION N/A 02/20/2017    Procedure: SVT ABLATION;  Surgeon: Marinus Maw, MD;  Location: [facility] INVASIVE CV LAB;  Service: Cardiovascular;  Laterality: N/A;   TONSILLECTOMY   08/2002   TOTAL KNEE ARTHROPLASTY Right     TOTAL KNEE ARTHROPLASTY Left 02/21/2014    Procedure: LEFT TOTAL KNEE ARTHROPLASTY;  Surgeon: Velna Ochs, MD;  Location: [facility] OR;  Service: Orthopedics;  Laterality: Left;   TUBAL LIGATION   1980   ULTRASOUND GUIDANCE FOR VASCULAR ACCESS Right 07/17/2016    Procedure: ULTRASOUND GUIDANCE FOR VASCULAR ACCESS;  Surgeon: Maeola Harman, MD;  Location: [facility] OR;  Service: Vascular;  Laterality: Right;   VENOGRAM Right 07/17/2016    Procedure: right ASCENDING VENOGRAM WITH ANGIOJET;  Surgeon: Maeola Harman, MD;  Location: [facility] OR;  Service: Vascular;  Laterality: Right;          Tobacco Use History  Social History        Tobacco Use  Smoking Status Never   Passive exposure: Never  Smokeless Tobacco Never      Social History       Substance  and Sexual Activity  Alcohol Use No      Social History         Socioeconomic History   Marital status: Divorced      Spouse name: Not on file   Number of children: Not on file   Years of education: Not on file   Highest education level: Not on file  Occupational History   Occupation: Unemployed  Tobacco Use   Smoking status: Never      Passive exposure: Never   Smokeless tobacco: Never  Substance and Sexual Activity   Alcohol use: No   Drug use: No   Sexual activity: Not on file  Other Topics Concern   Not on file  Social History Narrative   Not on file    Social Drivers of Health        Financial Resource Strain: Medium Risk (12/22/2022)    Received from [facility]    Overall Financial Resource Strain (CARDIA)     Difficulty of Paying Living Expenses: Somewhat hard  Food Insecurity: Food Insecurity Present (12/22/2022)    Received from [facility]    Hunger Vital Sign     Worried About Running Out of Food in the Last Year: Sometimes true     Ran Out of Food in the Last Year: Sometimes true  Transportation Needs: No Transportation Needs (12/22/2022)    Received from [facility] - Transportation     In the past 12 months, has lack of transportation kept you from medical appointments or from getting medications?: No     Lack of Transportation (Non-Medical): No  Physical Activity: Not on file  Stress: Not on file  Social Connections: Not on file  Intimate Partner Violence: Not on file      Allergies       Allergies  Allergen Reactions   Nitrofurantoin Itching and Shortness Of Breath   Effexor [Venlafaxine] Hives   Myrbetriq [Mirabegron] Other (See Comments)      BACK PAIN DRY MOUTH   Nsaids Other (See Comments) and Hives      Hypersensitivity vasculitis VASCULITIS   Reglan [Metoclopramide] Other (See Comments)      Blister in mouth    Clarithromycin        Eyes and mouth irritation  Flagyl [Metronidazole] Hives   Ketoprofen Itching      Irritates eyes    Levofloxacin        Unknown reaction    Paxil [Paroxetine]        Deep itch   Tolterodine Other (See Comments)      Unknown reaction    Ampicillin Rash   Cholecalciferol Rash      In high doses    Codeine Other (See Comments)      Head feels like its crawling   Fluoxetine Hcl Itching      Deep Itch     Sulfa Antibiotics Nausea And Vomiting and Other (See Comments)      Stomach irritation     Tetracycline Nausea And Vomiting      Severe stomach pain              Current Outpatient Medications  Medication Sig Dispense Refill   albuterol (VENTOLIN HFA) 108 (90 Base) MCG/ACT inhaler Inhale 2 puffs into the lungs every 6 (six) hours as needed for wheezing or shortness of breath.       alendronate (FOSAMAX) 70 MG tablet Take 70 mg by mouth every Sunday. Take with a full glass of water on an empty stomach.       atorvastatin (LIPITOR) 20 MG tablet Take 20 mg by mouth daily.       cetirizine (ZYRTEC) 10 MG tablet Take 10 mg by mouth daily.       CHOLECALCIFEROL PO Take 1 capsule by mouth daily.       escitalopram (LEXAPRO) 20 MG tablet Take 20 mg by mouth daily.       ipratropium (ATROVENT) 0.06 % nasal spray Place 2 sprays into both nostrils 2 (two) times daily as needed for rhinitis.       ketoconazole (NIZORAL) 2 % shampoo Apply 1 Application topically 3 (three) times a week. Wash scalp 3 time weekly, let sit 5 minutes and rinse out (Patient taking differently: Apply 1 Application topically every other day.) 120 mL 11   montelukast (SINGULAIR) 10 MG tablet Take 10 mg by mouth at bedtime.       Multiple Vitamin (MULTI-VITAMIN) tablet Take 1 tablet by mouth daily.       pantoprazole (PROTONIX) 40 MG tablet Take 40 mg by mouth daily.       telmisartan (MICARDIS) 20 MG tablet Take 20 mg by mouth daily.          No current facility-administered medications for this visit.               Family History   Problem Relation Age of Onset   Clotting disorder Mother 84        Blood clot, foot amputation   Cervical cancer Mother     Arthritis Sister     Heart attack Maternal Grandmother 74   Breast cancer Sister 69   Asthma Cousin                  Physical Exam: Morbidly obese Lungs: clear Card: RR with 3/6 sem Ext: no edema Neuro: alert and oriented         Diagnostic Studies & Laboratory data: I have personally reviewed the following studies and agree with the findings   TTE (10/2022) IMPRESSIONS     1. Left ventricular ejection fraction, by estimation, is 55 to 60%. The  left ventricle has normal function. The left ventricle has no regional  wall motion abnormalities. Left ventricular diastolic  parameters are  consistent with Grade I diastolic  dysfunction (impaired relaxation). The average left ventricular global  longitudinal strain is -18.7 %. The global longitudinal strain is normal.   2. Right ventricular systolic function is normal. The right ventricular  size is normal. There is normal pulmonary artery systolic pressure. The  estimated right ventricular systolic pressure is 31.7 mmHg.   3. Left atrial size was mildly dilated.   4. The mitral valve is normal in structure. Trivial mitral valve  regurgitation. No evidence of mitral stenosis.   5. The aortic valve is tricuspid. There is severe calcifcation of the  aortic valve. Aortic valve regurgitation is trivial. Paradoxical low  flow/low gradient severe aortic valve stenosis. Aortic valve area, by VTI  measures 0.73 cm. Aortic valve mean  gradient measures 31.0 mmHg.   6. The inferior vena cava is normal in size with greater than 50%  respiratory variability, suggesting right atrial pressure of 3 mmHg.   FINDINGS   Left Ventricle: Left ventricular ejection fraction, by estimation, is 55  to 60%. The left ventricle has normal function. The left ventricle has no  regional wall motion abnormalities. The average  left ventricular global  longitudinal strain is -18.7 %.  The global longitudinal strain is normal. The left ventricular internal  cavity size was normal in size. There is no left ventricular hypertrophy.  Left ventricular diastolic parameters are consistent with Grade I  diastolic dysfunction (impaired  relaxation).   Right Ventricle: The right ventricular size is normal. No increase in  right ventricular wall thickness. Right ventricular systolic function is  normal. There is normal pulmonary artery systolic pressure. The tricuspid  regurgitant velocity is 2.68 m/s, and   with an assumed right atrial pressure of 3 mmHg, the estimated right  ventricular systolic pressure is 31.7 mmHg.   Left Atrium: Left atrial size was mildly dilated.   Right Atrium: Right atrial size was normal in size.   Pericardium: There is no evidence of pericardial effusion.   Mitral Valve: The mitral valve is normal in structure. Trivial mitral  valve regurgitation. No evidence of mitral valve stenosis.   Tricuspid Valve: The tricuspid valve is normal in structure. Tricuspid  valve regurgitation is mild.   Aortic Valve: The aortic valve is tricuspid. There is severe calcifcation  of the aortic valve. Aortic valve regurgitation is trivial. Aortic  regurgitation PHT measures 381 msec. Severe aortic stenosis is present.  Aortic valve mean gradient measures 31.0  mmHg. Aortic valve peak gradient measures 57.8 mmHg. Aortic valve area, by  VTI measures 0.73 cm.   Pulmonic Valve: The pulmonic valve was normal in structure. Pulmonic valve  regurgitation is trivial.   Aorta: The aortic root is normal in size and structure.   Venous: The inferior vena cava is normal in size with greater than 50%  respiratory variability, suggesting right atrial pressure of 3 mmHg.   IAS/Shunts: No atrial level shunt detected by color flow Doppler.     LEFT VENTRICLE  PLAX 2D  LVIDd:         4.15 cm   Diastology  LVIDs:          2.55 cm   LV e' medial:    5.98 cm/s  LV PW:         0.95 cm   LV E/e' medial:  14.1  LV IVS:        1.10 cm   LV e' lateral:   7.18 cm/s  LVOT diam:  2.10 cm   LV E/e' lateral: 11.7  LV SV:         67  LV SV Index:   32        2D Longitudinal Strain  LVOT Area:     3.46 cm  2D Strain GLS (A2C):   -17.8 %                           2D Strain GLS (A3C):   -16.3 %                           2D Strain GLS (A4C):   -22.2 %                           2D Strain GLS Avg:     -18.7 %                             3D Volume EF:                           3D EF:        50 %                           LV EDV:       125 ml                           LV ESV:       62 ml                           LV SV:        63 ml   RIGHT VENTRICLE  RV Basal diam:  3.70 cm  RV S prime:     16.90 cm/s  TAPSE (M-mode): 2.0 cm  RVSP:           31.7 mmHg   LEFT ATRIUM             Index        RIGHT ATRIUM           Index  LA diam:        4.50 cm 2.18 cm/m   RA Pressure: 3.00 mmHg  LA Vol (A2C):   41.4 ml 20.01 ml/m  RA Area:     14.90 cm  LA Vol (A4C):   69.1 ml 33.40 ml/m  RA Volume:   39.10 ml  18.90 ml/m  LA Biplane Vol: 55.7 ml 26.93 ml/m   AORTIC VALVE  AV Area (Vmax):    0.74 cm  AV Area (Vmean):   0.81 cm  AV Area (VTI):     0.73 cm  AV Vmax:           380.00 cm/s  AV Vmean:          245.200 cm/s  AV VTI:            0.909 m  AV Peak Grad:      57.8 mmHg  AV Mean Grad:      31.0 mmHg  LVOT Vmax:         81.13 cm/s  LVOT Vmean:  57.100 cm/s  LVOT VTI:          0.192 m  LVOT/AV VTI ratio: 0.21  AI PHT:            381 msec    AORTA  Ao Root diam: 2.50 cm  Ao Asc diam:  2.80 cm   MITRAL VALVE                TRICUSPID VALVE  MV Area (PHT): cm          TR Peak grad:   28.7 mmHg  MV Decel Time: 217 msec     TR Vmax:        268.00 cm/s  MV E velocity: 84.15 cm/s   Estimated RAP:  3.00 mmHg  MV A velocity: 105.00 cm/s  RVSP:           31.7 mmHg  MV E/A ratio:  0.80                               SHUNTS                              Systemic VTI:  0.19 m                              Systemic Diam: 2.10 cm    Cath (03/2023) Conclusion   1.  Normal right dominant coronary circulation. 2.  Fick cardiac output of 7.2 L removed per minute and Fick cardiac index of 3.5 L/min/m with the following hemodynamics:            Right atrial pressure mean of 10 mmHg            Right ventricular pressure 38/5 with an end-diastolic pressure of 14 mmHg            Wedge pressure mean of 15 mmHg with V waves to 19 mmHg            PA pressure 38/15 with a mean of 26 mmHg            PA pulsatility index of 3.2            PVR of 1.5    Recent Radiology Findings:   CTA (04/2023) FINDINGS: Average image quality.  Reduced contrast to noise ratio.   Aortic Valve:   Tricuspid aortic valve with severely reduced cusp excursion. Severely thickened and moderate-severely calcified aortic valve cusps.   AV calcium score: 779   Virtual Basal Annulus Measurements:   Maximum/Minimum Diameter: 25.1 x 20.8 mm   Perimeter: 71.3 mm   Area: 390 mm2   No significant LVOT calcifications.   Membranous septal length: 6 mm   Based on these measurements, the annulus would be suitable for a 23 mm Sapien 3 valve. Alternatively, Heart Team can consider 26 mm Evolut valve. Recommend Heart Team discussion for valve selection.   Sinus of Valsalva Measurements:   Non-coronary:  27 mm   Right - coronary:  27 mm   Left - coronary:  28 mm   Sinus of Valsalva Height:   Left: 21.4 mm   Right: 21.5 mm   Aorta: Common origin of the brachiocephalic and left common carotid arteries. Separate ostia of the left vertebral artery off aortic arch.   Sinotubular Junction:  26 mm   Ascending  Thoracic Aorta:  32 mm   Aortic Arch:  25 mm   Descending Thoracic Aorta:  23 mm   Coronary Artery Height above Annulus:   Left main: 17.1 mm   Right coronary: 16.5 mm   Coronary Arteries: Normal  coronary origin. Right dominance. The study was performed without use of NTG and insufficient for plaque evaluation. Coronary artery calcium seen in the proximal LAD, calcium score 66, which is 71st percentile for age and sex.   Optimum Fluoroscopic Angle for Delivery: LAO 1 CAU 1   OTHER:   Atria: Biatrial dilation   Left atrial appendage: No thrombus.   Mitral valve: Grossly normal, no mitral annular calcifications.   Pulmonary artery: Normal caliber.   Pulmonary veins: Normal anatomy.   IMPRESSION: 1. Tricuspid aortic valve with severely reduced cusp excursion. Severely thickened and moderate-severely calcified aortic valve cusps. 2. Aortic valve calcium score: 779 3. Annulus area: 390 mm2, suitable for 23 mm Sapien 3 valve. No LVOT calcifications. Membranous septal length 6 mm. 4. Sufficient coronary artery heights from annulus. 5. Optimum fluoroscopic angle for delivery: LAO 1 CAU 1       Recent Lab Findings: Recent Labs       Lab Results  Component Value Date    WBC 5.0 04/03/2023    HGB 10.5 (L) 04/10/2023    HCT 31.0 (L) 04/10/2023    PLT 194 04/03/2023    GLUCOSE 99 04/03/2023    ALT 18 03/09/2023    AST 22 03/09/2023    NA 144 04/10/2023    K 3.6 04/10/2023    CL 99 04/03/2023    CREATININE 1.11 (H) 04/03/2023    BUN 16 04/03/2023    CO2 24 04/03/2023    TSH 1.161 04/15/2016    INR 1.10 07/17/2016            Assessment / Plan:     68 yo female with NYHA class 1-2 symptoms of severe AS with normal LV function and no CAD with a valve area of 0.81 cm2  and a mean gradient of . She has a class I indication for AVR and secondary to her comorbidities of obesity and Wegner's would best be served with TAVR (medtronic valve via femoral access). She understands all the risks, goals, recovery from TAVR and she wishes to proceed. She would be a bailout candidate

## 2023-06-02 ENCOUNTER — Encounter (HOSPITAL_COMMUNITY)
Admission: RE | Disposition: A | Discharge status: Home / Self Care | Payer: Self-pay | Source: Home / Self Care | Attending: Thoracic Surgery (Cardiothoracic Vascular Surgery)

## 2023-06-02 ENCOUNTER — Inpatient Hospital Stay (HOSPITAL_COMMUNITY): Payer: Self-pay | Admitting: Certified Registered Nurse Anesthetist

## 2023-06-02 ENCOUNTER — Other Ambulatory Visit: Payer: Self-pay | Admitting: Physician Assistant

## 2023-06-02 ENCOUNTER — Inpatient Hospital Stay (HOSPITAL_COMMUNITY)

## 2023-06-02 ENCOUNTER — Inpatient Hospital Stay (HOSPITAL_COMMUNITY)
Admission: RE | Admit: 2023-06-02 | Discharge: 2023-06-03 | DRG: 267 | Disposition: A | Discharge status: Home / Self Care | Attending: Thoracic Surgery (Cardiothoracic Vascular Surgery) | Admitting: Thoracic Surgery (Cardiothoracic Vascular Surgery)

## 2023-06-02 ENCOUNTER — Encounter (HOSPITAL_COMMUNITY): Payer: Self-pay | Admitting: Cardiovascular Disease

## 2023-06-02 ENCOUNTER — Inpatient Hospital Stay (HOSPITAL_COMMUNITY): Payer: Self-pay | Admitting: Physician Assistant

## 2023-06-02 ENCOUNTER — Other Ambulatory Visit: Payer: Self-pay

## 2023-06-02 DIAGNOSIS — F418 Other specified anxiety disorders: Secondary | ICD-10-CM

## 2023-06-02 DIAGNOSIS — F32A Depression, unspecified: Secondary | ICD-10-CM | POA: Diagnosis present

## 2023-06-02 DIAGNOSIS — Z8261 Family history of arthritis: Secondary | ICD-10-CM | POA: Diagnosis not present

## 2023-06-02 DIAGNOSIS — G4730 Sleep apnea, unspecified: Secondary | ICD-10-CM | POA: Diagnosis present

## 2023-06-02 DIAGNOSIS — I350 Nonrheumatic aortic (valve) stenosis: Secondary | ICD-10-CM

## 2023-06-02 DIAGNOSIS — Z79899 Other long term (current) drug therapy: Secondary | ICD-10-CM

## 2023-06-02 DIAGNOSIS — B1710 Acute hepatitis C without hepatic coma: Secondary | ICD-10-CM | POA: Diagnosis present

## 2023-06-02 DIAGNOSIS — D649 Anemia, unspecified: Secondary | ICD-10-CM | POA: Diagnosis present

## 2023-06-02 DIAGNOSIS — Z560 Unemployment, unspecified: Secondary | ICD-10-CM

## 2023-06-02 DIAGNOSIS — N1831 Chronic kidney disease, stage 3a: Secondary | ICD-10-CM | POA: Diagnosis present

## 2023-06-02 DIAGNOSIS — Z952 Presence of prosthetic heart valve: Secondary | ICD-10-CM | POA: Diagnosis not present

## 2023-06-02 DIAGNOSIS — E782 Mixed hyperlipidemia: Secondary | ICD-10-CM | POA: Diagnosis present

## 2023-06-02 DIAGNOSIS — K219 Gastro-esophageal reflux disease without esophagitis: Secondary | ICD-10-CM | POA: Diagnosis present

## 2023-06-02 DIAGNOSIS — I82409 Acute embolism and thrombosis of unspecified deep veins of unspecified lower extremity: Secondary | ICD-10-CM | POA: Diagnosis present

## 2023-06-02 DIAGNOSIS — Z7983 Long term (current) use of bisphosphonates: Secondary | ICD-10-CM

## 2023-06-02 DIAGNOSIS — Z832 Family history of diseases of the blood and blood-forming organs and certain disorders involving the immune mechanism: Secondary | ICD-10-CM | POA: Diagnosis not present

## 2023-06-02 DIAGNOSIS — G4733 Obstructive sleep apnea (adult) (pediatric): Secondary | ICD-10-CM | POA: Diagnosis not present

## 2023-06-02 DIAGNOSIS — I129 Hypertensive chronic kidney disease with stage 1 through stage 4 chronic kidney disease, or unspecified chronic kidney disease: Secondary | ICD-10-CM | POA: Diagnosis present

## 2023-06-02 DIAGNOSIS — I10 Essential (primary) hypertension: Secondary | ICD-10-CM | POA: Diagnosis not present

## 2023-06-02 DIAGNOSIS — Z6841 Body Mass Index (BMI) 40.0 and over, adult: Secondary | ICD-10-CM | POA: Diagnosis not present

## 2023-06-02 DIAGNOSIS — Z886 Allergy status to analgesic agent status: Secondary | ICD-10-CM

## 2023-06-02 DIAGNOSIS — E1122 Type 2 diabetes mellitus with diabetic chronic kidney disease: Secondary | ICD-10-CM | POA: Diagnosis present

## 2023-06-02 DIAGNOSIS — E66813 Obesity, class 3: Secondary | ICD-10-CM | POA: Diagnosis present

## 2023-06-02 DIAGNOSIS — I7782 Antineutrophilic cytoplasmic antibody (ANCA) vasculitis: Secondary | ICD-10-CM | POA: Diagnosis present

## 2023-06-02 DIAGNOSIS — F419 Anxiety disorder, unspecified: Secondary | ICD-10-CM | POA: Diagnosis present

## 2023-06-02 DIAGNOSIS — Z96653 Presence of artificial knee joint, bilateral: Secondary | ICD-10-CM | POA: Diagnosis present

## 2023-06-02 DIAGNOSIS — Z8249 Family history of ischemic heart disease and other diseases of the circulatory system: Secondary | ICD-10-CM | POA: Diagnosis not present

## 2023-06-02 DIAGNOSIS — Z885 Allergy status to narcotic agent status: Secondary | ICD-10-CM

## 2023-06-02 DIAGNOSIS — E1129 Type 2 diabetes mellitus with other diabetic kidney complication: Secondary | ICD-10-CM | POA: Diagnosis present

## 2023-06-02 DIAGNOSIS — N2581 Secondary hyperparathyroidism of renal origin: Secondary | ICD-10-CM | POA: Diagnosis present

## 2023-06-02 DIAGNOSIS — Z888 Allergy status to other drugs, medicaments and biological substances status: Secondary | ICD-10-CM

## 2023-06-02 DIAGNOSIS — Z006 Encounter for examination for normal comparison and control in clinical research program: Secondary | ICD-10-CM | POA: Diagnosis not present

## 2023-06-02 DIAGNOSIS — Z882 Allergy status to sulfonamides status: Secondary | ICD-10-CM

## 2023-06-02 DIAGNOSIS — Z825 Family history of asthma and other chronic lower respiratory diseases: Secondary | ICD-10-CM

## 2023-06-02 HISTORY — PX: INTRAOPERATIVE TRANSTHORACIC ECHOCARDIOGRAM: SHX6523

## 2023-06-02 HISTORY — DX: Presence of prosthetic heart valve: Z95.2

## 2023-06-02 HISTORY — DX: Nonrheumatic aortic (valve) stenosis: I35.0

## 2023-06-02 LAB — ECHOCARDIOGRAM LIMITED
AR max vel: 1.08 cm2
AV Area VTI: 1.08 cm2
AV Area mean vel: 1.05 cm2
AV Mean grad: 9 mmHg
AV Peak grad: 16.6 mmHg
Ao pk vel: 2.04 m/s
Area-P 1/2: 3.91 cm2
Radius: 0.5 cm
S' Lateral: 2.9 cm

## 2023-06-02 LAB — POCT I-STAT, CHEM 8
BUN: 16 mg/dL (ref 8–23)
BUN: 16 mg/dL (ref 8–23)
Calcium, Ion: 1.2 mmol/L (ref 1.15–1.40)
Calcium, Ion: 1.09 mmol/L — ABNORMAL LOW (ref 1.15–1.40)
Chloride: 105 mmol/L (ref 98–111)
Chloride: 106 mmol/L (ref 98–111)
Creatinine, Ser: 1 mg/dL (ref 0.44–1.00)
Creatinine, Ser: 1 mg/dL (ref 0.44–1.00)
Glucose, Bld: 156 mg/dL — ABNORMAL HIGH (ref 70–99)
Glucose, Bld: 143 mg/dL — ABNORMAL HIGH (ref 70–99)
HCT: 29 % — ABNORMAL LOW (ref 36.0–46.0)
HCT: 30 % — ABNORMAL LOW (ref 36.0–46.0)
Hemoglobin: 9.9 g/dL — ABNORMAL LOW (ref 12.0–15.0)
Hemoglobin: 10.2 g/dL — ABNORMAL LOW (ref 12.0–15.0)
Potassium: 4.2 mmol/L (ref 3.5–5.1)
Potassium: 4 mmol/L (ref 3.5–5.1)
Sodium: 140 mmol/L (ref 135–145)
Sodium: 142 mmol/L (ref 135–145)
TCO2: 29 mmol/L (ref 22–32)
TCO2: 27 mmol/L (ref 22–32)

## 2023-06-02 LAB — GLUCOSE, CAPILLARY: Glucose-Capillary: 102 mg/dL — ABNORMAL HIGH (ref 70–99)

## 2023-06-02 SURGERY — TRANSCATHETER AORTIC VALVE REPLACEMENT, TRANSFEMORAL (CATHLAB)
Anesthesia: Monitor Anesthesia Care

## 2023-06-02 MED ORDER — NITROGLYCERIN IN D5W 200-5 MCG/ML-% IV SOLN
0.0000 ug/min | INTRAVENOUS | Status: DC
Start: 2023-06-02 — End: 2023-06-03

## 2023-06-02 MED ORDER — CHLORHEXIDINE GLUCONATE 0.12 % MT SOLN
15.0000 mL | Freq: Once | OROMUCOSAL | Status: AC
  Administered 2023-06-02: 15 mL via OROMUCOSAL
  Filled 2023-06-02: qty 15

## 2023-06-02 MED ORDER — PROPOFOL 10 MG/ML IV BOLUS
INTRAVENOUS | Status: DC | PRN
  Administered 2023-06-02: 10 mg via INTRAVENOUS
  Administered 2023-06-02: 20 mg via INTRAVENOUS

## 2023-06-02 MED ORDER — SODIUM CHLORIDE 0.9 % IV SOLN: 250.0000 mL | INTRAVENOUS | Status: DC | PRN

## 2023-06-02 MED ORDER — CHLORHEXIDINE GLUCONATE 4 % EX SOLN: 60.0000 mL | Freq: Once | CUTANEOUS | Status: DC

## 2023-06-02 MED ORDER — IOHEXOL 350 MG/ML SOLN
INTRAVENOUS | Status: DC | PRN
  Administered 2023-06-02: 40 mL

## 2023-06-02 MED ORDER — FENTANYL CITRATE (PF) 100 MCG/2ML IJ SOLN
INTRAMUSCULAR | Status: AC
  Filled 2023-06-02: qty 2

## 2023-06-02 MED ORDER — ACETAMINOPHEN 325 MG PO TABS
650.0000 mg | ORAL_TABLET | Freq: Four times a day (QID) | ORAL | Status: DC | PRN
  Administered 2023-06-02 – 2023-06-03 (×3): 650 mg via ORAL
  Filled 2023-06-02 (×3): qty 2

## 2023-06-02 MED ORDER — MORPHINE SULFATE (PF) 2 MG/ML IV SOLN: 1.0000 mg | INTRAVENOUS | Status: DC | PRN

## 2023-06-02 MED ORDER — OXYCODONE HCL 5 MG PO TABS
5.0000 mg | ORAL_TABLET | ORAL | Status: DC | PRN
  Administered 2023-06-03: 5 mg via ORAL
  Filled 2023-06-02: qty 1

## 2023-06-02 MED ORDER — ACETAMINOPHEN 650 MG RE SUPP: 650.0000 mg | Freq: Four times a day (QID) | RECTAL | Status: DC | PRN

## 2023-06-02 MED ORDER — PROPOFOL 500 MG/50ML IV EMUL
INTRAVENOUS | Status: DC | PRN
  Administered 2023-06-02: 25 ug/kg/min via INTRAVENOUS

## 2023-06-02 MED ORDER — VITAMIN D 25 MCG (1000 UNIT) PO TABS
1000.0000 [IU] | ORAL_TABLET | Freq: Every day | ORAL | Status: DC
  Administered 2023-06-02 – 2023-06-03 (×2): 1000 [IU] via ORAL
  Filled 2023-06-02 (×5): qty 1

## 2023-06-02 MED ORDER — PROTAMINE SULFATE 10 MG/ML IV SOLN
INTRAVENOUS | Status: DC | PRN
Start: 2023-06-02 — End: 2023-06-02
  Administered 2023-06-02: 150 mg via INTRAVENOUS

## 2023-06-02 MED ORDER — CEFAZOLIN SODIUM-DEXTROSE 2-4 GM/100ML-% IV SOLN
2.0000 g | Freq: Three times a day (TID) | INTRAVENOUS | Status: AC
  Administered 2023-06-02 – 2023-06-03 (×2): 2 g via INTRAVENOUS
  Filled 2023-06-02 (×2): qty 100

## 2023-06-02 MED ORDER — HEPARIN (PORCINE) IN NACL 2000-0.9 UNIT/L-% IV SOLN
INTRAVENOUS | Status: DC | PRN
  Administered 2023-06-02: 1000 mL

## 2023-06-02 MED ORDER — PANTOPRAZOLE SODIUM 40 MG PO TBEC
40.0000 mg | DELAYED_RELEASE_TABLET | Freq: Every day | ORAL | Status: DC
  Administered 2023-06-02 – 2023-06-03 (×2): 40 mg via ORAL
  Filled 2023-06-02 (×2): qty 1

## 2023-06-02 MED ORDER — SODIUM CHLORIDE 0.9 % IV SOLN: INTRAVENOUS | Status: DC | PRN

## 2023-06-02 MED ORDER — CHLORHEXIDINE GLUCONATE 0.12 % MT SOLN: 15.0000 mL | Freq: Once | OROMUCOSAL | Status: DC

## 2023-06-02 MED ORDER — LORATADINE 10 MG PO TABS
10.0000 mg | ORAL_TABLET | Freq: Every day | ORAL | Status: DC
  Administered 2023-06-02 – 2023-06-03 (×2): 10 mg via ORAL
  Filled 2023-06-02 (×2): qty 1

## 2023-06-02 MED ORDER — SODIUM CHLORIDE 0.9% FLUSH
3.0000 mL | Freq: Two times a day (BID) | INTRAVENOUS | Status: DC
  Administered 2023-06-03: 3 mL via INTRAVENOUS

## 2023-06-02 MED ORDER — SODIUM CHLORIDE 0.9 % IV SOLN
INTRAVENOUS | Status: DC
Start: 2023-06-03 — End: 2023-06-02

## 2023-06-02 MED ORDER — INSULIN ASPART 100 UNIT/ML IJ SOLN: 0.0000 [IU] | INTRAMUSCULAR | Status: DC | PRN

## 2023-06-02 MED ORDER — CHLORHEXIDINE GLUCONATE 4 % EX SOLN: 30.0000 mL | CUTANEOUS | Status: DC

## 2023-06-02 MED ORDER — GLYCOPYRROLATE 0.2 MG/ML IJ SOLN
INTRAMUSCULAR | Status: DC | PRN
Start: 2023-06-02 — End: 2023-06-02
  Administered 2023-06-02: .2 mg via INTRAVENOUS

## 2023-06-02 MED ORDER — MONTELUKAST SODIUM 10 MG PO TABS
10.0000 mg | ORAL_TABLET | Freq: Every day | ORAL | Status: DC
  Administered 2023-06-02: 10 mg via ORAL
  Filled 2023-06-02: qty 1

## 2023-06-02 MED ORDER — LIDOCAINE HCL (PF) 1 % IJ SOLN
INTRAMUSCULAR | Status: DC | PRN
  Administered 2023-06-02 (×2): 10 mL

## 2023-06-02 MED ORDER — CEFAZOLIN SODIUM-DEXTROSE 2-4 GM/100ML-% IV SOLN: 2.0000 g | Freq: Three times a day (TID) | INTRAVENOUS | Status: DC

## 2023-06-02 MED ORDER — SODIUM CHLORIDE 0.9% FLUSH: 3.0000 mL | INTRAVENOUS | Status: DC | PRN

## 2023-06-02 MED ORDER — HEPARIN SODIUM (PORCINE) 1000 UNIT/ML IJ SOLN
INTRAMUSCULAR | Status: DC | PRN
Start: 2023-06-02 — End: 2023-06-02
  Administered 2023-06-02: 15000 [IU] via INTRAVENOUS

## 2023-06-02 MED ORDER — ATORVASTATIN CALCIUM 10 MG PO TABS
20.0000 mg | ORAL_TABLET | Freq: Every day | ORAL | Status: DC
  Administered 2023-06-02: 20 mg via ORAL
  Filled 2023-06-02: qty 2

## 2023-06-02 MED ORDER — ASPIRIN 81 MG PO TBEC
81.0000 mg | DELAYED_RELEASE_TABLET | Freq: Every day | ORAL | Status: DC
  Administered 2023-06-02 – 2023-06-03 (×2): 81 mg via ORAL
  Filled 2023-06-02 (×2): qty 1

## 2023-06-02 MED ORDER — SODIUM CHLORIDE 0.9 % IV SOLN: INTRAVENOUS | Status: AC

## 2023-06-02 MED ORDER — HEPARIN (PORCINE) IN NACL 1000-0.9 UT/500ML-% IV SOLN
INTRAVENOUS | Status: DC | PRN
  Administered 2023-06-02: 500 mL

## 2023-06-02 MED ORDER — FENTANYL CITRATE (PF) 100 MCG/2ML IJ SOLN
INTRAMUSCULAR | Status: DC | PRN
  Administered 2023-06-02: 25 ug via INTRAVENOUS

## 2023-06-02 MED ORDER — ESCITALOPRAM OXALATE 20 MG PO TABS
20.0000 mg | ORAL_TABLET | Freq: Every morning | ORAL | Status: DC
  Administered 2023-06-03: 20 mg via ORAL
  Filled 2023-06-02: qty 1

## 2023-06-02 SURGICAL SUPPLY — 30 items
BAG SNAP BAND KOVER 36X36 (MISCELLANEOUS) ×2 IMPLANT
CABLE ADAPT PACING TEMP 12FT (ADAPTER) IMPLANT
CATH DIAG 6FR PIGTAIL ANGLED (CATHETERS) IMPLANT
CATH INFINITI 5 FR STR PIGTAIL (CATHETERS) IMPLANT
CATH INFINITI 6F AL1 (CATHETERS) IMPLANT
CATH S G BIP PACING (CATHETERS) IMPLANT
CLOSURE MYNX CONTROL 6F/7F (Vascular Products) IMPLANT
CLOSURE PERCLOSE PROSTYLE (VASCULAR PRODUCTS) IMPLANT
GUIDEWIRE CNFDA BRKR CVD (WIRE) IMPLANT
INTRODUCER 7FR 23CM (INTRODUCER) IMPLANT
KIT SINGLE USE MANIFOLD (KITS) IMPLANT
PACK CARDIAC CATHETERIZATION (CUSTOM PROCEDURE TRAY) ×1 IMPLANT
SET ATX-X65L (MISCELLANEOUS) IMPLANT
SHEATH DRYSEAL FLEX 18FR 33CM (SHEATH) IMPLANT
SHEATH PINNACLE 6F 10CM (SHEATH) IMPLANT
SHEATH PINNACLE 8F 10CM (SHEATH) IMPLANT
SHEATH PROBE COVER 6X72 (BAG) IMPLANT
SHIELD CATH-GARD CONTAMINATION (MISCELLANEOUS) IMPLANT
STOPCOCK MORSE 400PSI 3WAY (MISCELLANEOUS) ×2 IMPLANT
SYS EVOLUT FX DELIVERY 23-29 (CATHETERS) ×1 IMPLANT
SYS EVOLUT FX LOADING 23-29 (CATHETERS) ×1 IMPLANT
SYSTEM EVOLUT FX DELIVRY 23-29 (CATHETERS) IMPLANT
SYSTEM EVOLUT FX LOADING 23-29 (CATHETERS) IMPLANT
TRANSDUCER W/STOPCOCK (MISCELLANEOUS) IMPLANT
TUBING ART PRESS 72 MALE/FEM (TUBING) IMPLANT
VALVE EVOLUT FX+ TRANS 26 (Valve) IMPLANT
WIRE AMPLATZ SS-J .035X180CM (WIRE) IMPLANT
WIRE EMERALD 3MM-J .035X150CM (WIRE) IMPLANT
WIRE EMERALD 3MM-J .035X260CM (WIRE) IMPLANT
WIRE EMERALD ST .035X260CM (WIRE) IMPLANT

## 2023-06-02 NOTE — Anesthesia Preprocedure Evaluation (Addendum)
 Anesthesia Evaluation  Patient identified by MRN, date of birth, ID band Patient awake    Reviewed: Allergy & Precautions, NPO status , Patient's Chart, lab work & pertinent test results  History of Anesthesia Complications (+) PROLONGED EMERGENCE and history of anesthetic complications  Airway Mallampati: III  TM Distance: >3 FB Neck ROM: Full    Dental  (+) Dental Advisory Given, Edentulous Upper,    Pulmonary sleep apnea    breath sounds clear to auscultation       Cardiovascular hypertension, Pt. on medications (-) angina + Valvular Problems/Murmurs AS  Rhythm:Regular  1. Left ventricular ejection fraction, by estimation, is 55 to 60%. The  left ventricle has normal function. The left ventricle has no regional  wall motion abnormalities. Left ventricular diastolic parameters are  consistent with Grade I diastolic  dysfunction (impaired relaxation). The average left ventricular global  longitudinal strain is -18.7 %. The global longitudinal strain is normal.   2. Right ventricular systolic function is normal. The right ventricular  size is normal. There is normal pulmonary artery systolic pressure. The  estimated right ventricular systolic pressure is 31.7 mmHg.   3. Left atrial size was mildly dilated.   4. The mitral valve is normal in structure. Trivial mitral valve  regurgitation. No evidence of mitral stenosis.   5. The aortic valve is tricuspid. There is severe calcifcation of the  aortic valve. Aortic valve regurgitation is trivial. Paradoxical low  flow/low gradient severe aortic valve stenosis. Aortic valve area, by VTI  measures 0.73 cm. Aortic valve mean  gradient measures 31.0 mmHg.   6. The inferior vena cava is normal in size with greater than 50%  respiratory variability, suggesting right atrial pressure of 3 mmHg.    1.  Normal right dominant coronary circulation. 2.  Fick cardiac output of 7.2 L removed  per minute and Fick cardiac index of 3.5 L/min/m with the following hemodynamics:            Right atrial pressure mean of 10 mmHg            Right ventricular pressure 38/5 with an end-diastolic pressure of 14 mmHg            Wedge pressure mean of 15 mmHg with V waves to 19 mmHg            PA pressure 38/15 with a mean of 26 mmHg            PA pulsatility index of 3.2            PVR of 1.5   Recommendation: Continue evaluation for aortic valve intervention.     Neuro/Psych  Headaches PSYCHIATRIC DISORDERS Anxiety Depression       GI/Hepatic Neg liver ROS,GERD  Medicated and Controlled,,  Endo/Other  diabetes  Class 3 obesity  Renal/GU Renal InsufficiencyRenal diseaseLab Results      Component                Value               Date                      NA                       139                 05/29/2023  K                        4.1                 05/29/2023                CO2                      26                  05/29/2023                GLUCOSE                  112 (H)             05/29/2023                BUN                      16                  05/29/2023                CREATININE               1.09 (H)            05/29/2023                CALCIUM                  9.3                 05/29/2023                EGFR                     54 (L)              04/03/2023                GFRNONAA                 55 (L)              05/29/2023                Musculoskeletal  (+) Arthritis ,    Abdominal   Peds  Hematology Lab Results      Component                Value               Date                      WBC                      5.7                 05/29/2023                HGB                      13.0                05/29/2023                HCT  38.3                05/29/2023                MCV                      97.2                05/29/2023                PLT                      190                 05/29/2023               Anesthesia Other Findings   Reproductive/Obstetrics                             Anesthesia Physical Anesthesia Plan  ASA: 4  Anesthesia Plan: MAC   Post-op Pain Management: Minimal or no pain anticipated   Induction: Intravenous  PONV Risk Score and Plan: 2 and Ondansetron and Propofol infusion  Airway Management Planned: Oral ETT  Additional Equipment: Arterial line  Intra-op Plan:   Post-operative Plan:   Informed Consent: I have reviewed the patients History and Physical, chart, labs and discussed the procedure including the risks, benefits and alternatives for the proposed anesthesia with the patient or authorized representative who has indicated his/her understanding and acceptance.     Dental advisory given  Plan Discussed with: CRNA  Anesthesia Plan Comments:         Anesthesia Quick Evaluation

## 2023-06-02 NOTE — Op Note (Signed)
 [facility]ART [facility] VASC[facility]ENTER   MULTIDISCIPLINARY HEART VALVE TEAM     TAVR OPERATIVE NOTE   NAME@ [id number]  Date of Procedure:                 06/02/2023   Preoperative Diagnosis:      Severe Aortic Stenosis    Postoperative Diagnosis:    Same    Procedure:        Transcatheter Aortic Valve Replacement - Percutaneous right Transfemoral Approach             Medtronic Evolut FXPLUS  (size 26 mm)              Co-Surgeons:            Eugenio Hoes, MD and Doylene Bode, MD     Anesthesiologist:                  Dr Sol Passer, MD   Echocardiographer:              Dr Royann Shivers, MD   Pre-operative Echo Findings: Severe aortic stenosis  Normal left ventricular systolic function   Post-operative Echo Findings: no paravalvular leak Normal left ventricular systolic function      BRIEF CLINICAL NOTE AND INDICATIONS FOR SURGERY    68 yo female with NYHA class 1-2 symptoms of severe AS with normal LV function and no CAD with a valve area of 0.81 cm2 and a mean gradient of . She has a class I indication for AVR and secondary to her comorbidities of obesity and Wegner's would best be served with TAVR (medtronic valve via femoral access        DETAILS OF THE OPERATIVE PROCEDURE    PREPARATION:    The patient was brought to the operating room on the above mentioned date and appropriate monitoring was established by the anesthesia team. The patient was placed in the supine position on the operating table.  Intravenous antibiotics were administered. The patient was monitored closely throughout the procedure under conscious sedation.    Baseline transthoracic echocardiogram was performed. The patient's abdomen and both groins were prepped and draped in a sterile manner. A time out procedure was performed.   PERIPHERAL ACCESS:    Using the modified Seldinger technique, femoral arterial and venous access was obtained with placement of 6 Fr sheaths on the left side.  A pigtail  diagnostic catheter was passed through the left arterial sheath under fluoroscopic guidance into the aortic root.  A temporary transvenous pacemaker catheter was passed through the right femoral venous sheath under fluoroscopic guidance into the right ventricle.  The pacemaker was tested to ensure stable lead placement and pacemaker capture. Aortic root angiography was performed in order to determine the optimal angiographic angle for valve deployment.    TRANSFEMORAL ACCESS:    Percutaneous transfemoral access and sheath placement was performed using ultrasound guidance.  The right common femoral artery was cannulated using a micropuncture needle.  A pair of Abbott Perclose percutaneous closure devices were placed and a 6 French sheath replaced into the femoral artery.  The patient was heparinized systemically and ACT verified > 250 seconds.     An 18 Fr transfemoral Gore Dry-Seal sheath was introduced into the right femoral artery after progressively dilating over an Amplatz superstiff wire. An AL-1 catheter was used to direct a straight-tip exchange length wire across the native aortic valve into the left ventricle. This was exchanged out for a pigtail catheter and position was confirmed  in the LV apex. Simultaneous LV and Ao pressures were recorded.  The pigtail catheter was exchanged for a Safari wire in the LV apex.    BALLOON AORTIC VALVULOPLASTY:    Not performed   TRANSCATHETER HEART VALVE DEPLOYMENT:    A Medtronic Evolut FX transcatheter heart valve (size 26 mm) was prepared and loaded into the delivery catheter system per manufacturer's guidelines and the proper orientation of the valve is confirmed under fluoroscopy. The delivery system and inline sheath were inserted into the right common femoral artery over the [facility] wire and the inline sheath advanced into the abdominal aorta under fluoroscopic guidance. The delivery catheter was advanced around the aortic arch and the valve was  carefully positioned across the aortic valve annulus. An aortic root injection was performed to confirm position and the valve deployed using cusp overlap technique under fluoroscopic guidance. Intermittent pacing was used during valve deployment. The delivery system and guidewire were retracted into the descending aorta and the nosecone re-sheathed. Valve function was assessed using echocardiography. There is felt to be no paravalvular leak and no central aortic insufficiency. The patient's hemodynamic recovery following valve deployment is good.        PROCEDURE COMPLETION:    The delivery system and in-line sheath were removed and femoral artery closure performed using the Perclose devices.  Protamine was administered once femoral arterial repair was complete. The temporary pacemaker, pigtail catheters and femoral sheaths were removed with manual pressure used venous for hemostasis and a Mynx closure device for contralateral arterial hemostasis.    The patient tolerated the procedure well and is transported to the cath lab recovery area in stable condition. There were no immediate intraoperative complications. All sponge instrument and needle counts are verified correct at completion of the operation.    No blood products were administered during the operation.   The patient received a total of 40 mL of intravenous contrast during the procedure.    Eugenio Hoes, MD

## 2023-06-02 NOTE — Interval H&P Note (Signed)
 History and Physical Interval Note:  06/02/2023 2:44 PM  Cassandra [name]  has presented today for surgery, with the diagnosis of Severe Aortic Stenosis.  The various methods of treatment have been discussed with the patient and family. After consideration of risks, benefits and other options for treatment, the patient has consented to  Procedure(s): Transcatheter Aortic Valve Replacement, Transfemoral (N/A) ECHOCARDIOGRAM, TRANSTHORACIC (N/A) as a surgical intervention.  The patient's history has been reviewed, patient examined, no change in status, stable for surgery.  I have reviewed the patient's chart and labs.  Questions were answered to the patient's satisfaction.     Eugenio Hoes

## 2023-06-02 NOTE — Progress Notes (Signed)
 Pt arrived from ...cath.., A/ox 4...pt denies any pain, MD aware,CCMD called. CHG bath given,no further needs at this time

## 2023-06-02 NOTE — Progress Notes (Signed)
  Echocardiogram 2D Echocardiogram has been performed.  Delcie Roch 06/02/2023, 4:39 PM

## 2023-06-02 NOTE — Discharge Instructions (Signed)

## 2023-06-02 NOTE — Discharge Summary (Signed)
 [facility]   MULTIDISCIPLINARY HEART VALVE TEAM  Discharge Summary    Patient ID: Cassandra [name] MRN: [id number]; DOB: 01-18-1956  Admit date: 06/02/2023 Discharge date: 06/03/2023  Primary Care Provider: Carren Rang, PA-C  Primary Cardiologist: Kristeen Miss, MD / Dr. Clifton James & Dr. Leafy Ro (TAVR)  Discharge Diagnoses    Principal Problem:   S/P TAVR (transcatheter aortic valve replacement) Active Problems:   HEPATITIS C   Anxiety and depression   Depression   GERD (gastroesophageal reflux disease)   ANCA-associated vasculitis (HCC)   Obesity, Class III, BMI 40-49.9 (morbid obesity) (HCC)   Anemia   DVT (deep venous thrombosis) (HCC)   Hyperlipidemia, mixed   Type 2 diabetes mellitus with kidney complication, without long-term current use of insulin (HCC)   Severe aortic stenosis   Allergies Allergies  Allergen Reactions   Nitrofurantoin Itching and Shortness Of Breath   Effexor [Venlafaxine] Hives   Myrbetriq [Mirabegron] Other (See Comments)    BACK PAIN DRY MOUTH   Nsaids Other (See Comments) and Hives    Hypersensitivity vasculitis VASCULITIS   Reglan [Metoclopramide] Other (See Comments)    Blister in mouth    Clarithromycin     Eyes and mouth irritation    Flagyl [Metronidazole] Hives   Ketoprofen Itching    Irritates eyes    Levofloxacin     Unknown reaction    Paxil [Paroxetine]     Deep itch   Tolterodine Other (See Comments)    Unknown reaction    Ampicillin Rash   Cholecalciferol Rash    In high doses    Codeine Other (See Comments)    Head feels like its crawling   Fluoxetine Hcl Itching    Deep Itch    Sulfa Antibiotics Nausea And Vomiting and Other (See Comments)    Stomach irritation    Tetracycline Nausea And Vomiting    Severe stomach pain    Diagnostic Studies/Procedures    TAVR OPERATIVE NOTE     Date of Procedure:                06/03/2023   Preoperative Diagnosis:      Severe Aortic Stenosis     Postoperative Diagnosis:    Same    Procedure:        Transcatheter Aortic Valve Replacement - Transfemoral Approach             Medtronic Evolut FX  THV  (size 26 mm, model # [id number], serial # [id number])              Co-Surgeons:                        Verne Carrow, MD and Eugenio Hoes, MD    Anesthesiologist:                  Maple Hudson   Echocardiographer:              Croitoru   Pre-operative Echo Findings: Severe aortic stenosis Normal left ventricular systolic function   Post-operative Echo Findings: No paravalvular leak Normal left ventricular systolic function  _____________    Echo 06/03/23: completed but pending formal read at the time of discharge   History of Present Illness     Cassandra [name] is a 68 y.o. female with a history of of SVT s/p ablation, HTN, morbid obesity (BMI 41), diet controlled DMT2, pauci-immune glomerulonephritis, secondary hyperparathyroidism, CKD stage IIIa and severe paradoxical LFLG  who presented to [facility] on 06/02/23 for planned TAVR.   She was seen in the office by Dr. Excell Seltzer for evaluation of severe paradoxical LFLG AS in 10/2022. Echo at that time showed LVEF of 55-60%, mean grad 31.1 mmHg, AVA 0.81 cm2, DVI 0.21, SVI 33. It was ultimately decided to continue surveillance. She then developed carpal tunnel requiring surgery and aortic stenosis needed to be addressed prior. [facility] 04/10/23 showed normal right dominant coronary circulation and right heart pressures.   The patient was evaluated by the multidisciplinary valve team and felt to have severe, symptomatic aortic stenosis and to be a suitable candidate for TAVR, which was set up for 06/02/23.   Hospital Course     Consultants: none   Severe AS:  -- S/p successful TAVR with a 26 mm Medtronic Evolut FX+ THV via the TF approach on 06/02/23.  -- Post operative echo completed but pending formal read. -- Groin sites are stable.  -- ECG/tele with sinus and no high grade heart block. --  Started on a baby Asprin 81mg  daily. -- Met with cardiac rehab to discuss CRP phase II.  -- Plan for discharge home today with close follow up in the outpatient setting.   HTN: -- BP well controlled  -- Resume home telmisartan 20mg .   Morbid obesity:  -- Body mass index is 44.72 kg/m.    SVT:  -- S/p remote ablation. -- No recent palpitations.   Migraine headaches: -- Has a HA this morning and treated with home Maxalt.   Pauci-immune glomerulonephritis:  -- With history of proteinuria and hematuria. -- Continue telmisartan 20mg  daily.  -- Followed Dr. Wynelle Link  Secondary hyperparathyroidism:  -- Continue cholecalciferol 1000mg  daily.  -- Followed Dr. Wynelle Link  Elevated Raise score: -- with history of LFLG AS and carpal tunnel she is at increased risk for amyloid heart disease. -- Will discuss screening in the outpatient setting.   Carpal tunnel: -- Will address surgical clearance in the outpatient setting.  _____________  Discharge Vitals Blood pressure (!) 118/51, pulse (!) 50, temperature 98.2 F (36.8 C), temperature source Oral, resp. rate 14, height 5\' 3"  (1.6 m), weight 114.5 kg, SpO2 95%.  Filed Weights   06/01/23 1300 06/02/23 1308 06/03/23 0100  Weight: 105.7 kg (P) 105.7 kg 114.5 kg    GEN: Well nourished, well developed, in no acute distress, obese HEENT: normal Neck: no JVD or masses Cardiac: RRR; 2/6 SEM heard best at RUSB. No rubs, or gallops,no edema  Respiratory:  clear to auscultation bilaterally, normal work of breathing GI: soft, nontender, nondistended, + BS MS: no deformity or atrophy Skin: warm and dry, no rash.  Groin sites clear without hematoma or ecchymosis. Right bandage with small amount of blood.  Neuro:  Alert and Oriented x 3, Strength and sensation are intact Psych: euthymic mood, full affect  Disposition   Pt is being discharged home today in good condition.  Follow-up Plans & Appointments     Follow-up Information      Kathleene Hazel, MD. Go on 06/11/2023.   Specialty: Cardiology Why: @ 2:20, please arrive at least 10 minutes early. Contact information: [address] [place] [address] [phone]                Discharge Instructions     Amb Referral to Cardiac Rehabilitation   Complete by: As directed    Diagnosis: Valve Replacement   Valve: Aortic Comment - TAVR   After initial evaluation and  assessments completed: Virtual Based Care may be provided alone or in conjunction with Phase 2 Cardiac Rehab based on patient barriers.: Yes   Intensive Cardiac Rehabilitation (ICR) [facility] location only OR Traditional Cardiac Rehabilitation (TCR) *If criteria for ICR are not met will enroll in TCR [facility] only): Yes       Discharge Medications   Allergies as of 06/03/2023       Reactions   Nitrofurantoin Itching, Shortness Of Breath   Effexor [venlafaxine] Hives   Myrbetriq [mirabegron] Other (See Comments)   BACK PAIN DRY MOUTH   Nsaids Other (See Comments), Hives   Hypersensitivity vasculitis VASCULITIS   Reglan [metoclopramide] Other (See Comments)   Blister in mouth    Clarithromycin    Eyes and mouth irritation    Flagyl [metronidazole] Hives   Ketoprofen Itching   Irritates eyes    Levofloxacin    Unknown reaction    Paxil [paroxetine]    Deep itch   Tolterodine Other (See Comments)   Unknown reaction    Ampicillin Rash   Cholecalciferol Rash   In high doses    Codeine Other (See Comments)   Head feels like its crawling   Fluoxetine Hcl Itching   Deep Itch   Sulfa Antibiotics Nausea And Vomiting, Other (See Comments)   Stomach irritation   Tetracycline Nausea And Vomiting   Severe stomach pain        Medication List     TAKE these medications    acetaminophen 500 MG tablet Commonly known as: TYLENOL Take 1,000 mg by mouth every 6 (six) hours as needed for moderate pain (pain score 4-6) or mild pain (pain score 1-3).   albuterol 108 (90  Base) MCG/ACT inhaler Commonly known as: VENTOLIN HFA Inhale 2 puffs into the lungs every 6 (six) hours as needed (Congestion).   alendronate 70 MG tablet Commonly known as: FOSAMAX Take 70 mg by mouth every Sunday. Take with a full glass of water on an empty stomach.   aspirin EC 81 MG tablet Take 1 tablet (81 mg total) by mouth daily. Swallow whole. Start taking on: June 04, 2023   atorvastatin 20 MG tablet Commonly known as: LIPITOR Take 20 mg by mouth at bedtime.   cetirizine 10 MG tablet Commonly known as: ZYRTEC Take 10 mg by mouth in the morning.   Cholecalciferol 25 MCG (1000 UT) tablet Take 1,000 Units by mouth daily.   Erenumab-aooe 140 MG/ML Soaj Inject 140 mg into the skin every 30 (thirty) days.   escitalopram 20 MG tablet Commonly known as: LEXAPRO Take 20 mg by mouth in the morning.   ipratropium 0.06 % nasal spray Commonly known as: ATROVENT Place 1 spray into both nostrils at bedtime.   ketoconazole 2 % shampoo Commonly known as: NIZORAL Apply 1 Application topically 3 (three) times a week. Wash scalp 3 time weekly, let sit 5 minutes and rinse out What changed:  when to take this additional instructions   montelukast 10 MG tablet Commonly known as: SINGULAIR Take 10 mg by mouth at bedtime.   Multi-Vitamin tablet Take 1 tablet by mouth daily.   pantoprazole 40 MG tablet Commonly known as: PROTONIX Take 40 mg by mouth daily.   rizatriptan 10 MG tablet Commonly known as: MAXALT Take 5 mg by mouth as needed for migraine.   telmisartan 20 MG tablet Commonly known as: MICARDIS Take 20 mg by mouth in the morning.          Outstanding Labs/Studies  none _____________________  Duration of Discharge Encounter: APP Time: 20 minutes    Signed, Cline Crock, PA-C 06/03/2023, 12:18 PM [phone]

## 2023-06-02 NOTE — CV Procedure (Signed)
 HEART AND VASCULAR CENTER  TAVR OPERATIVE NOTE   Date of Procedure:  06/03/2023  Preoperative Diagnosis: Severe Aortic Stenosis   Postoperative Diagnosis: Same   Procedure:   Transcatheter Aortic Valve Replacement - Transfemoral Approach  Medtronic Evolut FX  THV  (size 26 mm, model # [id number], serial # [id number])   Co-Surgeons:  Verne Carrow, MD and Eugenio Hoes, MD   Anesthesiologist:  Maple Hudson  Echocardiographer:  Croitoru  Pre-operative Echo Findings: Severe aortic stenosis Normal left ventricular systolic function  Post-operative Echo Findings: No paravalvular leak Normal left ventricular systolic function  BRIEF CLINICAL NOTE AND INDICATIONS FOR SURGERY  68 yo female with history of severe paradoxical LFLG aortic stenosis, obesity, SVT, HTN, DM, CKD here today for TAVR. Echo with normal LV function, severe LFLG AS with mean gradient 31 mmHg, DI 0.21. Cardiac cath with no evidence of CAD. Exertional dyspnea.   During the course of the patient's preoperative work up they have been evaluated comprehensively by a multidisciplinary team of specialists coordinated through the Multidisciplinary Heart Valve Clinic in the [facility].  They have been demonstrated to suffer from symptomatic severe aortic stenosis as noted above. The patient has been counseled extensively as to the relative risks and benefits of all options for the treatment of severe aortic stenosis including long term medical therapy, conventional surgery for aortic valve replacement, and transcatheter aortic valve replacement.  The patient has been independently evaluated by Dr. Laneta Simmers with CT surgery and they are felt to be at high risk for conventional surgical aortic valve replacement. The surgeon indicated the patient would be a poor candidate for conventional surgery. Based upon review of all of the patient's preoperative diagnostic tests they are felt to be candidate for  transcatheter aortic valve replacement using the transfemoral approach as an alternative to high risk conventional surgery.    Following the decision to proceed with transcatheter aortic valve replacement, a discussion has been held regarding what types of management strategies would be attempted intraoperatively in the event of life-threatening complications, including whether or not the patient would be considered a candidate for the use of cardiopulmonary bypass and/or conversion to open sternotomy for attempted surgical intervention.  The patient has been advised of a variety of complications that might develop peculiar to this approach including but not limited to risks of death, stroke, paravalvular leak, aortic dissection or other major vascular complications, aortic annulus rupture, device embolization, cardiac rupture or perforation, acute myocardial infarction, arrhythmia, heart block or bradycardia requiring permanent pacemaker placement, congestive heart failure, respiratory failure, renal failure, pneumonia, infection, other late complications related to structural valve deterioration or migration, or other complications that might ultimately cause a temporary or permanent loss of functional independence or other long term morbidity.  The patient provides full informed consent for the procedure as described and all questions were answered preoperatively.    DETAILS OF THE OPERATIVE PROCEDURE  PREPARATION:   The patient is brought to the operating room on the above mentioned date and central monitoring was established by the anesthesia team including placement of a radial arterial line. The patient is placed in the supine position on the operating table.  Intravenous antibiotics are administered. Conscious sedation is used.   Baseline transthoracic echocardiogram was performed. The patient's chest, abdomen, both groins, and both lower extremities are prepared and draped in a sterile manner. A  time out procedure is performed.   PERIPHERAL ACCESS:   Using the modified Seldinger technique, femoral arterial and  venous access were obtained with placement of a 6 Fr sheath in the artery and a 7 Fr sheath in the vein on the left side using u/s guidance.  A pigtail diagnostic catheter was passed through the femoral arterial sheath under fluoroscopic guidance into the aortic root.  A temporary transvenous pacemaker catheter was passed through the femoral venous sheath under fluoroscopic guidance into the right ventricle.  The pacemaker was tested to ensure stable lead placement and pacemaker capture. Aortic root angiography was performed in order to determine the optimal angiographic angle for valve deployment.  TRANSFEMORAL ACCESS:  A micropuncture kit was used to gain access to the right femoral artery using u/s guidance. Position confirmed with angiography. Pre-closure with double ProGlide closure devices. The patient was heparinized systemically and ACT verified > 250 seconds.    A 18 Fr transfemoral Dry Seal sheath was introduced into the right femoral artery over an Amplatz superstiff wire. An AL-1 catheter was used to direct a straight-tip exchange length wire across the native aortic valve into the left ventricle. This was exchanged out for a pigtail catheter and position was confirmed in the LV apex. Simultaneous LV and Ao pressures were recorded.  The pigtail catheter was then exchanged for an Confida wire in the LV apex.   TRANSCATHETER HEART VALVE DEPLOYMENT:  A Medtronic Evolut FX THV size 26 mm was prepared and per manufacturer's guidelines, and the proper orientation of the valve is confirmed on the delivery system.  The valve was advanced through the introducer sheath into the descending aorta. The valve was then advanced across the aortic arch. The valve was carefully positioned across the aortic valve annulus. Once final position of the valve has been confirmed with angiographic  assessment in the cusp overlap view and in the LAO view, the valve is deployed with controlled rapid pacing. The valve is taken to 80% deployment and appropriate depth is confirmed. The valve is then released from each paddle. There is no valvular leak and no central aortic insufficiency.  The patient's hemodynamic recovery following valve deployment is good.  Echo demostrated acceptable post-procedural gradients, stable mitral valve function, and no AI.   PROCEDURE COMPLETION:  The sheath was then removed and closure devices were completed. Protamine was administered once femoral arterial repair was complete. The temporary pacemaker, pigtail catheters and femoral sheaths were removed with a Mynx closure device placed in the artery and manual pressure used for venous hemostasis.    The patient tolerated the procedure well and is transported to the surgical intensive care in stable condition. There were no immediate intraoperative complications. All sponge instrument and needle counts are verified correct at completion of the operation.   No blood products were administered during the operation.  The patient received a total of 40 mL of intravenous contrast during the procedure.  Verne Carrow MD 06/03/2023 8:44 AM

## 2023-06-02 NOTE — Progress Notes (Signed)
 Dr. Cherly Beach on-call for cards text-paged @ 2111 regarding pt's HR sustaining 40s SB w/freq dips as low as 34 and no observed block. BPs 100s/40s-50s. Groggy but asymptomatic in bed.  @2120  Above provider MD returned called back and endorsed to monitor for now and to re-page if pt became symptomatic.

## 2023-06-02 NOTE — Transfer of Care (Signed)
 Immediate Anesthesia Transfer of Care Note  Patient: Cassandra [name]  Procedure(s) Performed: Transcatheter Aortic Valve Replacement, Transfemoral ECHOCARDIOGRAM, TRANSTHORACIC  Patient Location: PACU and Cath Lab  Anesthesia Type:MAC  Level of Consciousness: awake and alert   Airway & Oxygen Therapy: Patient Spontanous Breathing and Patient connected to face mask oxygen  Post-op Assessment: Report given to RN and Post -op Vital signs reviewed and stable  Post vital signs: Reviewed and stable  Last Vitals:  Vitals Value Taken Time  BP 132/62 06/02/23 1654  Temp    Pulse 58 06/02/23 1656  Resp 16 06/02/23 1656  SpO2 94 % 06/02/23 1656  Vitals shown include unfiled device data.  Last Pain:  Vitals:   06/02/23 1327  TempSrc:   PainSc: 0-No pain      Patients Stated Pain Goal: 0 (06/02/23 1327)  Complications: There were no known notable events for this encounter.

## 2023-06-03 ENCOUNTER — Inpatient Hospital Stay (HOSPITAL_COMMUNITY)

## 2023-06-03 ENCOUNTER — Other Ambulatory Visit: Payer: Self-pay

## 2023-06-03 DIAGNOSIS — I350 Nonrheumatic aortic (valve) stenosis: Secondary | ICD-10-CM

## 2023-06-03 DIAGNOSIS — Z952 Presence of prosthetic heart valve: Secondary | ICD-10-CM | POA: Diagnosis not present

## 2023-06-03 LAB — BASIC METABOLIC PANEL
Anion gap: 8 (ref 5–15)
BUN: 14 mg/dL (ref 8–23)
CO2: 26 mmol/L (ref 22–32)
Calcium: 8.6 mg/dL — ABNORMAL LOW (ref 8.9–10.3)
Chloride: 103 mmol/L (ref 98–111)
Creatinine, Ser: 0.96 mg/dL (ref 0.44–1.00)
GFR, Estimated: >60 mL/min (ref >60)
Glucose, Bld: 103 mg/dL — ABNORMAL HIGH (ref 70–99)
Potassium: 4.3 mmol/L (ref 3.5–5.1)
Sodium: 137 mmol/L (ref 135–145)

## 2023-06-03 LAB — CBC
HCT: 34.9 % — ABNORMAL LOW (ref 36.0–46.0)
Hemoglobin: 11.5 g/dL — ABNORMAL LOW (ref 12.0–15.0)
MCH: 32.3 pg (ref 26.0–34.0)
MCHC: 33 g/dL (ref 30.0–36.0)
MCV: 98 fL (ref 80.0–100.0)
Platelets: 115 10*3/uL — ABNORMAL LOW (ref 150–400)
RBC: 3.56 MIL/uL — ABNORMAL LOW (ref 3.87–5.11)
RDW: 13.1 % (ref 11.5–15.5)
WBC: 4.7 10*3/uL (ref 4.0–10.5)
nRBC: 0 % (ref 0.0–0.2)

## 2023-06-03 LAB — MAGNESIUM: Magnesium: 1.9 mg/dL (ref 1.7–2.4)

## 2023-06-03 MED ORDER — ASPIRIN 81 MG PO TBEC: 81.0000 mg | DELAYED_RELEASE_TABLET | Freq: Every day | ORAL | Status: DC

## 2023-06-03 NOTE — Progress Notes (Signed)
 Mobility Specialist Progress Note:    06/03/23 1105  Mobility  Activity Ambulated with assistance in hallway  Level of Assistance Standby assist, set-up cues, supervision of patient - no hands on  Assistive Device Front wheel walker  Distance Ambulated (ft) 1350 ft  Activity Response Tolerated well  Mobility Referral Yes  Mobility visit 1 Mobility  Mobility Specialist Start Time (ACUTE ONLY) 1045  Mobility Specialist Stop Time (ACUTE ONLY) 1100  Mobility Specialist Time Calculation (min) (ACUTE ONLY) 15 min   Pt eager for mobility session, received in bed. Ambulated in hallway with RW and SV. Tolerated well, VSS throughout. Max HR 93 bpm, SpO2 96% on RA. Returned pt to room for lunch. Pt eager for another session after lunch. Left with all needs met, sitting EOB.    Feliciana Rossetti Mobility Specialist Please contact via Special educational needs teacher or  Rehab office at [phone]

## 2023-06-03 NOTE — Plan of Care (Signed)
  Problem: Education: Goal: Knowledge of General Education information will improve Description: Including pain rating scale, medication(s)/side effects and non-pharmacologic comfort measures Outcome: Progressing   Problem: Health Behavior/Discharge Planning: Goal: Ability to manage health-related needs will improve Outcome: Progressing   Problem: Activity: Goal: Risk for activity intolerance will decrease Outcome: Progressing   Problem: Nutrition: Goal: Adequate nutrition will be maintained Outcome: Progressing   Problem: Elimination: Goal: Will not experience complications related to bowel motility Outcome: Progressing Goal: Will not experience complications related to urinary retention Outcome: Progressing   Problem: Safety: Goal: Ability to remain free from injury will improve Outcome: Progressing   Problem: Skin Integrity: Goal: Risk for impaired skin integrity will decrease Outcome: Progressing   Problem: Coping: Goal: Level of anxiety will decrease Outcome: Not Progressing   Problem: Pain Managment: Goal: General experience of comfort will improve and/or be controlled Outcome: Not Progressing

## 2023-06-03 NOTE — Progress Notes (Signed)
 Discussed with pt restrictions, walking for exercise, and [facility]. Pt receptive and would like her referral be sent to [place] [facility]. Also gave her HH diet.  [id number] Cassandra [name] BS, ACSM-CEP 06/03/2023 9:27 AM

## 2023-06-03 NOTE — Anesthesia Postprocedure Evaluation (Signed)
 Anesthesia Post Note  Patient: Cassandra [name]  Procedure(s) Performed: Transcatheter Aortic Valve Replacement, Transfemoral ECHOCARDIOGRAM, TRANSTHORACIC     Patient location during evaluation: Cath Lab Anesthesia Type: MAC Level of consciousness: awake Pain management: pain level controlled Vital Signs Assessment: post-procedure vital signs reviewed and stable Respiratory status: spontaneous breathing, nonlabored ventilation and respiratory function stable Cardiovascular status: blood pressure returned to baseline and stable Postop Assessment: no apparent nausea or vomiting Anesthetic complications: no   There were no known notable events for this encounter.  Last Vitals:  Vitals:   06/03/23 0400 06/03/23 0500  BP: (!) 119/54 (!) 118/51  Pulse: (!) 54 (!) 50  Resp: 18 14  Temp:    SpO2: 97% 95%    Last Pain:  Vitals:   06/03/23 1009  TempSrc:   PainSc: 7                  Kristyne Woodring P Nefi Musich

## 2023-06-03 NOTE — Progress Notes (Signed)
  Echocardiogram 2D Echocardiogram has been performed.  Janalyn Harder 06/03/2023, 12:07 PM

## 2023-06-04 ENCOUNTER — Telehealth: Payer: Self-pay | Admitting: Physician Assistant

## 2023-06-04 LAB — ECHOCARDIOGRAM COMPLETE
AR max vel: 3.21 cm2
AV Area VTI: 3.24 cm2
AV Area mean vel: 3.44 cm2
AV Mean grad: 10 mmHg
AV Peak grad: 18.8 mmHg
Ao pk vel: 2.17 m/s
Area-P 1/2: 3.85 cm2
Calc EF: 60.3 %
Height: 63 in
S' Lateral: 3.1 cm
Single Plane A2C EF: 62.1 %
Single Plane A4C EF: 57.6 %
Weight: 4038.83 [oz_av]

## 2023-06-04 NOTE — Telephone Encounter (Signed)
  HEART AND VASCULAR CENTER   MULTIDISCIPLINARY HEART VALVE TEAM   Patient contacted regarding discharge from [facility] on 06/03/23.  Patient understands to follow up with a Dr. Clifton James 3/27 at [address]  Patient understands discharge instructions? yes Patient understands medications and regimen? yes Patient understands to bring all medications to this visit? Yes  Having a Migraine HA but otherwise doing okay.   Cline Crock PA-C  [facility]

## 2023-06-11 ENCOUNTER — Telehealth (HOSPITAL_COMMUNITY): Payer: Self-pay

## 2023-06-11 ENCOUNTER — Encounter: Payer: Self-pay | Admitting: Cardiovascular Disease

## 2023-06-11 ENCOUNTER — Ambulatory Visit: Attending: Cardiovascular Disease | Admitting: Cardiovascular Disease

## 2023-06-11 VITALS — BP 120/84 | HR 67 | Ht 63.0 in | Wt 228.6 lb

## 2023-06-11 DIAGNOSIS — I350 Nonrheumatic aortic (valve) stenosis: Secondary | ICD-10-CM

## 2023-06-11 DIAGNOSIS — Z952 Presence of prosthetic heart valve: Secondary | ICD-10-CM

## 2023-06-11 MED ORDER — DOXYCYCLINE HYCLATE 100 MG PO TABS: ORAL_TABLET | ORAL | 1 refills | Status: DC

## 2023-06-11 NOTE — Telephone Encounter (Signed)
 Attempted to call patient in regards to Cardiac Rehab - LM on VM

## 2023-06-11 NOTE — Progress Notes (Signed)
 Structural Heart Clinic Note  Chief Complaint  Patient presents with   Follow-up    S/p TAVR   History of Present Illness: 68 yo [name] with history of severe aortic stenosis now s/p TAVR, HTN, SVT, sleep apnea, obesity and DM who is here today for one week post TAVR follow up. She is followed in our office by Dr. Elease Hashimoto. Echo in August 2025 with severe paradoxical low flow/low gradient AS. She was seen by Dr. Excell Seltzer in August 2024 and was feeling well overall. She had the onset of dyspnea in January 2025. Cardiac cath January 2025 with no evidence of CAD. She underwent TAVR on 06/02/23 with placement of 26 mm Medtronic Evolut FX THV from the transfemoral approach. She did well following the procedure. Echo on 06/03/23 with LVEF=60-Cassandra%. AVR working well with trivial PVL.   She is here today for follow up. The patient denies any chest pain, dyspnea, palpitations, lower extremity edema, orthopnea, PND, dizziness, near syncope or syncope.   Primary Care Physician: Carren Rang, PA-C   Past Medical History:  Diagnosis Date   Allergy    Anxiety    Complication of anesthesia    difficulty waking up   Depression    GERD (gastroesophageal reflux disease)    Hypertension    Leukocytoclastic vasculitis (HCC)    Dr. Marchelle Gearing and Dr. 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 Clifton James & Dr. Leafy Ro   Severe aortic stenosis    Sleep apnea    cpap   SVT (supraventricular tachycardia) (HCC)    Type 2 diabetes mellitus with kidney complication, without long-term current use of insulin (HCC) 05/07/2016   Verrucae vulgaris    Wegner's disease (congenital syphilitic osteochondritis)     Past Surgical History:  Procedure Laterality Date   BUNIONECTOMY Bilateral    CARPAL TUNNEL RELEASE Right 2002   INTRAOPERATIVE TRANSTHORACIC  ECHOCARDIOGRAM N/A 06/02/2023   Procedure: ECHOCARDIOGRAM, TRANSTHORACIC;  Surgeon: Kathleene Hazel, MD;  Location: [facility] INVASIVE CV LAB;  Service: Cardiovascular;  Laterality: N/A;   JOINT REPLACEMENT Right    knee   LOWER EXTREMITY VENOGRAPHY Right 07/14/2016   Procedure: Lower Extremity Venography;  Surgeon: Maeola Harman, MD;  Location[facility] INVASIVE CV LAB;  Service: Cardiovascular;  Laterality: Right;   PERCUTANEOUS VENOUS THROMBECTOMY,LYSIS WITH INTRAVASCULAR ULTRASOUND (IVUS) Right 07/17/2016   Procedure: PERCUTANEOUS VENOUS THROMBECTOMY AND LYSIS WITH INTRAVASCULAR ULTRASOUND (IVUS) of right lower extermity with balloon angioplasty;  Surgeon: Maeola Harman, MD;  Location: [facility] OR;  Service: Vascular;  Laterality: Right;   RIGHT/LEFT HEART CATH AND CORONARY ANGIOGRAPHY N/A 04/10/2023   Procedure: RIGHT/LEFT HEART CATH AND CORONARY ANGIOGRAPHY;  Surgeon: Orbie Pyo, MD;  Location: [facility] INVASIVE CV LAB;  Service: Cardiovascular;  Laterality: N/A;   SVT ABLATION N/A 02/20/2017   Procedure: SVT ABLATION;  Surgeon: Marinus Maw, MD;  Location: [facility] INVASIVE CV LAB;  Service: Cardiovascular;  Laterality: N/A;   TONSILLECTOMY  08/2002   TOTAL KNEE ARTHROPLASTY Right    TOTAL KNEE ARTHROPLASTY Left 02/21/2014   Procedure: LEFT TOTAL KNEE ARTHROPLASTY;  Surgeon: Velna Ochs, MD;  Location: [facility] OR;  Service: Orthopedics;  Laterality: Left;   TUBAL LIGATION  1980   ULTRASOUND GUIDANCE FOR VASCULAR ACCESS Right 07/17/2016   Procedure: ULTRASOUND GUIDANCE FOR VASCULAR ACCESS;  Surgeon: Maeola Harman, MD;  Location: [facility] OR;  Service: Vascular;  Laterality: Right;   VENOGRAM Right 07/17/2016   Procedure: right ASCENDING VENOGRAM WITH ANGIOJET;  Surgeon: Maeola Harman, MD;  Location: [facility] OR;  Service: Vascular;  Laterality: Right;    Current Outpatient Medications  Medication Sig Dispense Refill   acetaminophen (TYLENOL) 500 MG tablet Take 1,000 mg by mouth  every 6 (six) hours as needed for moderate pain (pain score 4-6) or mild pain (pain score 1-3).     albuterol (VENTOLIN HFA) 108 (90 Base) MCG/ACT inhaler Inhale 2 puffs into the lungs every 6 (six) hours as needed (Congestion).     alendronate (FOSAMAX) 70 MG tablet Take 70 mg by mouth every Sunday. Take with a full glass of water on an empty stomach.     aspirin EC 81 MG tablet Take 1 tablet (81 mg total) by mouth daily. Swallow whole.     atorvastatin (LIPITOR) 20 MG tablet Take 20 mg by mouth at bedtime.     cetirizine (ZYRTEC) 10 MG tablet Take 10 mg by mouth in the morning.     Cholecalciferol 25 MCG (1000 UT) tablet Take 1,000 Units by mouth daily.     doxycycline (VIBRA-TABS) 100 MG tablet Take 1 tablet by mouth ONE HOUR BEFORE ANY DENTAL PROCEDURES, INCLUDING CLEANINGS 3 tablet 1   Erenumab-aooe 140 MG/ML SOAJ Inject 140 mg into the skin every 30 (thirty) days.     escitalopram (LEXAPRO) 20 MG tablet Take 20 mg by mouth in the morning.     ipratropium (ATROVENT) 0.06 % nasal spray Place 1 spray into both nostrils at bedtime.     ketoconazole (NIZORAL) 2 % shampoo Apply 1 Application topically 3 (three) times a week. Wash scalp 3 time weekly, let sit 5 minutes and rinse out (Patient taking differently: Apply 1 Application topically every other day.) 120 mL 11   montelukast (SINGULAIR) 10 MG tablet Take 10 mg by mouth at bedtime.     Multiple Vitamin (MULTI-VITAMIN) tablet Take 1 tablet by mouth daily.     pantoprazole (PROTONIX) 40 MG tablet Take 40 mg by mouth daily.     Rimegepant Sulfate (NURTEC) 75 MG TBDP Take 75 mg by mouth daily as needed.     rizatriptan (MAXALT) 10 MG tablet Take 5 mg by mouth as needed for migraine.     telmisartan (MICARDIS) 20 MG tablet Take 20 mg by mouth in the morning.     No current facility-administered medications for this visit.    Allergies  Allergen Reactions   Nitrofurantoin Itching and Shortness Of Breath   Effexor [Venlafaxine] Hives    Myrbetriq [Mirabegron] Other (See Comments)    BACK PAIN DRY MOUTH   Nsaids Other (See Comments) and Hives    Hypersensitivity vasculitis VASCULITIS   Reglan [Metoclopramide] Other (See Comments)    Blister in mouth    Clarithromycin     Eyes and mouth irritation    Flagyl [Metronidazole] Hives   Ketoprofen Itching    Irritates eyes    Levofloxacin     Unknown reaction    Paxil [Paroxetine]     Deep itch   Tolterodine Other (See Comments)    Unknown reaction    Ampicillin Rash   Cholecalciferol Rash    In high doses    Codeine Other (See Comments)    Head feels like its crawling   Fluoxetine Hcl Itching    Deep Itch    Sulfa Antibiotics Nausea  And Vomiting and Other (See Comments)    Stomach irritation    Tetracycline Nausea And Vomiting    Severe stomach pain    Social History   Socioeconomic History   Marital status: Divorced    Spouse name: Not on file   Number of children: Not on file   Years of education: Not on file   Highest education level: Not on file  Occupational History   Occupation: Unemployed  Tobacco Use   Smoking status: Never    Passive exposure: Never   Smokeless tobacco: Never  Substance and Sexual Activity   Alcohol use: No   Drug use: No   Sexual activity: Not on file  Other Topics Concern   Not on file  Social History Narrative   Not on file   Social Drivers of Health   Financial Resource Strain: Medium Risk (05/20/2023)   Received from [facility]   Overall Financial Resource Strain (CARDIA)    Difficulty of Paying Living Expenses: Somewhat hard  Food Insecurity: Food Insecurity Present (05/20/2023)   Received from [facility]   Hunger Vital Sign    Worried About Running Out of Food in the Last Year: Sometimes true    Ran Out of Food in the Last Year: Sometimes true  Transportation Needs: No Transportation Needs (05/20/2023)   Received from [facility] -  Transportation    In the past 12 months, has lack of transportation kept you from medical appointments or from getting medications?: No    Lack of Transportation (Non-Medical): No  Physical Activity: Not on file  Stress: Not on file  Social Connections: Not on file  Intimate Partner Violence: Not on file    Family History  Problem Relation Age of Onset   Clotting disorder Mother 50       Blood clot, foot amputation   Cervical cancer Mother    Arthritis Sister    Heart attack Maternal Grandmother 40   Breast cancer Sister 30   Asthma Cousin     Review of Systems:  As stated in the HPI and otherwise negative.   BP 120/84   Pulse 67   Ht 5\' 3"  (1.6 m)   Wt 103.7 kg   SpO2 97%   BMI 40.49 kg/m   Physical Examination: General: Well developed, well nourished, NAD  HEENT: OP clear, mucus membranes moist  SKIN: warm, dry. No rashes. Neuro: No focal deficits  Musculoskeletal: Muscle strength 5/5 all ext  Psychiatric: Mood and affect normal  Neck: No JVD, no carotid bruits, no thyromegaly, no lymphadenopathy.  Lungs:Clear bilaterally, no wheezes, rhonci, crackles Cardiovascular: Regular rate and rhythm. Soft systolic murmur.  Abdomen:Soft. Bowel sounds present. Non-tender.  Extremities: No lower extremity edema. Pulses are 2 + in the bilateral DP/PT.  EKG:  EKG is not ordered today. The ekg ordered today demonstrates   Recent Labs: 05/29/2023: ALT 29 06/03/2023: BUN 14; Creatinine, Ser 0.96; Hemoglobin 11.5; Magnesium 1.9; Platelets 115; Potassium 4.3; Sodium 137   Lipid Panel No results found for: "CHOL", "TRIG", "HDL", "CHOLHDL", "VLDL", "LDLCALC", "LDLDIRECT"   Wt Readings from Last 3 Encounters:  06/11/23 103.7 kg  06/03/23 114.5 kg  05/14/23 105.7 kg     Assessment and Plan:   1. Severe aortic stenosis s/p TAVR: She is doing well. NYHA functional class 2. Groins stable. BP is stable. Will continue ASA. SBE prophylaxis as needed. Echo in 3 weeks.   Labs/ tests  ordered  today include:  No orders of the defined types were placed in this encounter.  Disposition:   F/U with structural heart team in 3 weeks on day of echo.   Signed, Verne Carrow, MD, [facility] 06/11/2023 4:16 PM    [facility] [address] [place], [place], [place]  [address] Phone: [phone]; Fax: [phone]

## 2023-06-11 NOTE — Telephone Encounter (Signed)
 Pt insurance is active and benefits verified through [facility]. Co-pay $25.00, DED $0.00/$0.00 met, out of pocket $9,350.00/$662.00 met, co-insurance 0%. No pre-authorization required. Passport, 06/11/23 @ 11:24AM, REF#[id number]   How many CR sessions are covered? (36 visits for TCR, 72 visits for ICR)72 Is this a lifetime maximum or an annual maximum? Annual Has the member used any of these services to date? No Is there a time limit (weeks/months) on start of program and/or program completion? No     Will contact patient to see if she is interested in the Cardiac Rehab Program. If interested, patient will need to complete follow up appt. Once completed, patient will be contacted for scheduling upon review by the RN Navigator.

## 2023-06-11 NOTE — Patient Instructions (Addendum)
 Medication Instructions:  Your physician has recommended you make the following change in your medication:  1.) doxycycline 100 mg - take one tablet by mouth one hour before any dental procedures including cleanings  *If you need a refill on your cardiac medications before your next appointment, please call your pharmacy*   Lab Work: none If you have labs (blood work) drawn today and your tests are completely normal, you will receive your results only by: MyChart Message (if you have MyChart) OR A paper copy in the mail If you have any lab test that is abnormal or we need to change your treatment, we will call you to review the results.   Testing/Procedures: none   Follow-Up: As planned  Other Instructions

## 2023-06-30 DIAGNOSIS — M79642 Pain in left hand: Secondary | ICD-10-CM | POA: Insufficient documentation

## 2023-07-02 DIAGNOSIS — M19032 Primary osteoarthritis, left wrist: Secondary | ICD-10-CM | POA: Insufficient documentation

## 2023-07-06 ENCOUNTER — Ambulatory Visit: Attending: Family Medicine | Admitting: Physician Assistant

## 2023-07-06 ENCOUNTER — Ambulatory Visit (HOSPITAL_BASED_OUTPATIENT_CLINIC_OR_DEPARTMENT_OTHER)

## 2023-07-06 VITALS — BP 128/76 | HR 62 | Resp 16 | Ht 63.0 in | Wt 228.6 lb

## 2023-07-06 DIAGNOSIS — I7782 Antineutrophilic cytoplasmic antibody (ANCA) vasculitis: Secondary | ICD-10-CM | POA: Diagnosis present

## 2023-07-06 DIAGNOSIS — I5032 Chronic diastolic (congestive) heart failure: Secondary | ICD-10-CM | POA: Insufficient documentation

## 2023-07-06 DIAGNOSIS — G5603 Carpal tunnel syndrome, bilateral upper limbs: Secondary | ICD-10-CM | POA: Insufficient documentation

## 2023-07-06 DIAGNOSIS — Z952 Presence of prosthetic heart valve: Secondary | ICD-10-CM | POA: Insufficient documentation

## 2023-07-06 DIAGNOSIS — I43 Cardiomyopathy in diseases classified elsewhere: Secondary | ICD-10-CM | POA: Diagnosis present

## 2023-07-06 DIAGNOSIS — I10 Essential (primary) hypertension: Secondary | ICD-10-CM | POA: Diagnosis present

## 2023-07-06 DIAGNOSIS — I350 Nonrheumatic aortic (valve) stenosis: Secondary | ICD-10-CM | POA: Diagnosis present

## 2023-07-06 DIAGNOSIS — I471 Supraventricular tachycardia, unspecified: Secondary | ICD-10-CM | POA: Insufficient documentation

## 2023-07-06 DIAGNOSIS — E854 Organ-limited amyloidosis: Secondary | ICD-10-CM | POA: Diagnosis present

## 2023-07-06 LAB — ECHOCARDIOGRAM COMPLETE
AR max vel: 1.39 cm2
AV Area VTI: 1.52 cm2
AV Area mean vel: 1.45 cm2
AV Mean grad: 8 mmHg
AV Peak grad: 14.4 mmHg
Ao pk vel: 1.9 m/s
Calc EF: 61 %
S' Lateral: 2.65 cm
Single Plane A2C EF: 61.4 %
Single Plane A4C EF: 60 %

## 2023-07-06 NOTE — Patient Instructions (Addendum)
 Medication Instructions:  No medication changes were made during today's visit.  *If you need a refill on your cardiac medications before your next appointment, please call your pharmacy*   Lab Work: Labs will be drawn today. If you have labs (blood work) drawn today and your tests are completely normal, you will receive your results only by: MyChart Message (if you have MyChart) OR A paper copy in the mail If you have any lab test that is abnormal or we need to change your treatment, we will call you to review the results.   Testing/Procedures: Your physician has requested that you have an echocardiogram in 1 year. Echocardiography is a painless test that uses sound waves to create images of your heart. It provides your doctor with information about the size and shape of your heart and how well your heart's chambers and valves are working. This procedure takes approximately one hour. There are no restrictions for this procedure. Please do NOT wear cologne, perfume, aftershave, or lotions (deodorant is allowed). Please arrive 15 minutes prior to your appointment time.    Please note: We ask at that you not bring children with you during ultrasound (echo/ vascular) testing. Due to room size and safety concerns, children are not allowed in the ultrasound rooms during exams. Our front office staff cannot provide observation of children in our lobby area while testing is being conducted. An adult accompanying a patient to their appointment will only be allowed in the ultrasound room at the discretion of the ultrasound technician under special circumstances. We apologize for any inconvenience.    Follow-Up: At [facility], you and your health needs are our priority.  As part of our continuing mission to provide you with exceptional heart care, we have created designated Provider Care Teams.  These Care Teams include your primary Cardiologist (physician) and Advanced Practice Providers  (APPs -  Physician Assistants and Nurse Practitioners) who all work together to provide you with the care you need, when you need it.  We recommend signing up for the patient portal called "MyChart".  Sign up information is provided on this After Visit Summary.  MyChart is used to connect with patients for Virtual Visits (Telemedicine).  Patients are able to view lab/test results, encounter notes, upcoming appointments, etc.  Non-urgent messages can be sent to your provider as well.   To learn more about what you can do with MyChart, go to [email or web address].    Your next appointment:   1 year(s)  Provider:   Dr. Jere Monaco     Other Instructions HEART & VASCULAR CENTER  [address] [place], [place]  [address] OPENING APRIL 28,2025       1st Floor: - Lobby - Registration  - Pharmacy  - Lab - Cafe   2nd Floor: - PV Lab - Diagnostic Testing (echo, CT, nuclear med)   3rd Floor: - Vacant   4th Floor: - TCTS (cardiothoracic surgery) - AFib Clinic - Structural Heart Clinic - Vascular Surgery  - Vascular Ultrasound   5th Floor: - HeartCare Cardiology (general and EP) - Clinical Pharmacy for coumadin , hypertension, lipid, weight-loss medications, and med management appointments      Valet parking services will be available as well.

## 2023-07-06 NOTE — Progress Notes (Addendum)
 [facility]   MULTIDISCIPLINARY HEART VALVE CLINIC                                     Cardiology Office Note:    Date:  07/06/2023   ID:  Cassandra [name], DOB 06/29/1955, MRN [id number]  PCP:  Morton Areas, PA-C  [facility] Cardiologist:  Ahmad Alert, MD --> Dr. Emiliano [facility] Structural heart: Cassandra Batman, MD [facility] Electrophysiologist:  None   Referring MD: Morton Areas, PA-C   1 month s/p TAVR  History of Present Illness:    Cassandra [name] is a 68 y.o. 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   Echo in 10/2022 showed LVEF of 55-60% and severe LFLG AS with mean grad 31.1 mmHg, AVA 0.81 cm2, DVI 0.21, SVI 33. She was evaluated by structural heart with plans for continued surveillance. She then developed carpal tunnel requiring surgery and aortic stenosis needed to be addressed prior. [facility] 04/10/23 showed normal right dominant coronary circulation and right heart pressures. S/p TAVR with 26 mm Medtronic Evolut FX+ THV via the TF approach on 06/02/23. Post operative echo showed EF 60%, normally functioning TAVR with a mean gradient of 10 mmHg and trivial PVL (at 2 o'clock) and mild MR.  Today the patient presents to clinic for follow up. She feels like a brand new person since her TAVR valve. Wants to lose weight but has a lot of trouble with this. No CP or SOB. No LE edema, orthopnea or PND. No dizziness or syncope. No blood in stool or urine. No palpitations.   Past Medical History:  Diagnosis Date   Allergy    Anxiety    Complication of anesthesia    difficulty waking up   Depression    GERD (gastroesophageal reflux disease)    Hypertension    Leukocytoclastic vasculitis (HCC)    Dr. Bertrum Brodie and Dr. 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 Abel Hoe & Dr. Honey Lusty   Severe aortic stenosis    Sleep apnea    cpap   SVT (supraventricular tachycardia) (HCC)    Type 2 diabetes mellitus with kidney complication, without long-term current use of insulin  (HCC) 05/07/2016   Verrucae vulgaris    Wegner's disease (congenital syphilitic osteochondritis)      Current Medications: Current Meds  Medication Sig   acetaminophen  (TYLENOL ) 500 MG tablet Take 1,000 mg by mouth every 6 (six) hours as needed for moderate pain (pain score 4-6) or mild pain (pain score 1-3).   albuterol  (VENTOLIN  HFA) 108 (90 Base) MCG/ACT inhaler Inhale 2 puffs into the lungs every 6 (six) hours as needed (Congestion).   alendronate (FOSAMAX) 70 MG tablet Take 70 mg by mouth every Sunday. Take with a full glass of water on an empty stomach.   aspirin  EC 81 MG tablet Take 1 tablet (81 mg total) by mouth daily. Swallow whole.   atorvastatin  (LIPITOR) 20 MG tablet Take 20 mg by mouth at bedtime.   cetirizine (ZYRTEC) 10 MG tablet Take 10  mg by mouth in the morning.   Cholecalciferol  25 MCG (1000 UT) tablet Take 1,000 Units by mouth daily.   escitalopram  (LEXAPRO ) 20 MG tablet Take 20 mg by mouth in the morning.   ipratropium (ATROVENT) 0.06 % nasal spray Place 1 spray into both nostrils at bedtime.   ketoconazole  (NIZORAL ) 2 % shampoo Apply 1 Application topically 3 (three) times a week. Wash scalp 3 time weekly, let sit 5 minutes and rinse out (Patient taking differently: Apply 1 Application topically every other day.)   montelukast  (SINGULAIR ) 10 MG tablet Take 10 mg by mouth at bedtime.   Multiple Vitamin (MULTI-VITAMIN) tablet Take 1 tablet by mouth daily.   pantoprazole  (PROTONIX ) 40 MG tablet Take 40 mg by mouth daily.   QULIPTA 60 MG TABS Take 1 tablet by mouth daily.    Rimegepant Sulfate (NURTEC) 75 MG TBDP Take 75 mg by mouth daily as needed.   telmisartan (MICARDIS) 20 MG tablet Take 20 mg by mouth in the morning.      ROS:   Please see the history of present illness.    All other systems reviewed and are negative.  EKGs       Risk Assessment/Calculations:           Physical Exam:    VS:  BP 128/76 (BP Location: Left Arm, Patient Position: Sitting, Cuff Size: Large)   Pulse 62   Resp 16   Ht 5\' 3"  (1.6 m)   Wt 228 lb 9.6 oz (103.7 kg)   SpO2 96%   BMI 40.49 kg/m     Wt Readings from Last 3 Encounters:  07/06/23 228 lb 9.6 oz (103.7 kg)  06/11/23 228 lb 9.6 oz (103.7 kg)  06/03/23 252 lb 6.8 oz (114.5 kg)     GEN: Well nourished, well developed in no acute distress, obese.  NECK: No JVD CARDIAC: RRR, soft systolic flow murmur over RUSB. No rubs, gallops RESPIRATORY:  Clear to auscultation without rales, wheezing or rhonchi  ABDOMEN: Soft, non-tender, non-distended EXTREMITIES:  No edema; No deformity.  ASSESSMENT:    1. S/P TAVR (transcatheter aortic valve replacement)   2. Essential hypertension   3. Obesity, Class III, BMI 40-49.9 (morbid obesity) (HCC)   4. SVT (supraventricular tachycardia) (HCC)   5. ANCA-associated vasculitis (HCC)   6. Amyloid heart disease (HCC)   7. Bilateral carpal tunnel syndrome    PLAN:    In order of problems listed above:  Severe AS s/p TAVR:  -- Echo today shows EF 65%, mild LVH, mild RVE, normally functioning TAVR with a mean gradient of 8 mm hg and mild PVL as well as mild MR.  -- NYHA class I symptoms.  -- Continue Aspirin  81mg  daily.  -- SBE discussed. She has doxycycline .  -- I will see back for 1 year office visit with echo.  HTN: -- BP well controlled today. -- Continue telmisartan 20mg  daily.   Morbid obesity:  -- Body mass index is 40.49 kg/m. -- Has pre DMT2 with a HG A1c of 6.0 last year and OSA.  -- Initially interested in discussing GLP-1s and referral sent to  pharmacy but later wanted to hold off and discuss with Dr. Andee Kato.   SVT:  -- S/p remote ablation. -- No recent palpitations.   Pauci-immune glomerulonephritis:  -- With history of proteinuria and hematuria. -- Continue telmisartan 20mg  daily.  -- Followed Dr. Lamount Pimple.   Elevated Raise score: -- With history of LFLG AS and bilateral carpal tunnel she  is at increased risk for amyloid heart disease. -- Discussed with pt and plan amyloid work up with SPEP/UPEP and PYP scan.    Carpal tunnel: -- She said the MD now wants to hold off on surgery.  -- If she does proceed with surgery in the near future, she is cleared from a cardiology standpoint.      Cardiac Rehabilitation Eligibility Assessment  The patient is ready to start cardiac rehabilitation from a cardiac standpoint.    Medication Adjustments/Labs and Tests Ordered: Current medicines are reviewed at length with the patient today.  Concerns regarding medicines are outlined above.  Orders Placed This Encounter  Procedures   SPEP   UPEP   AMB Referral to [facility] Pharm-D   PYP Scan   ECHOCARDIOGRAM COMPLETE   No orders of the defined types were placed in this encounter.   Patient Instructions  Medication Instructions:  No medication changes were made during today's visit.  *If you need a refill on your cardiac medications before your next appointment, please call your pharmacy*   Lab Work: Labs will be drawn today. If you have labs (blood work) drawn today and your tests are completely normal, you will receive your results only by: MyChart Message (if you have MyChart) OR A paper copy in the mail If you have any lab test that is abnormal or we need to change your treatment, we will call you to review the results.   Testing/Procedures: Your physician has requested that you have an echocardiogram in 1 year. Echocardiography is a painless test that uses sound waves to create images of your heart. It provides your  doctor with information about the size and shape of your heart and how well your heart's chambers and valves are working. This procedure takes approximately one hour. There are no restrictions for this procedure. Please do NOT wear cologne, perfume, aftershave, or lotions (deodorant is allowed). Please arrive 15 minutes prior to your appointment time.    Please note: We ask at that you not bring children with you during ultrasound (echo/ vascular) testing. Due to room size and safety concerns, children are not allowed in the ultrasound rooms during exams. Our front office staff cannot provide observation of children in our lobby area while testing is being conducted. An adult accompanying a patient to their appointment will only be allowed in the ultrasound room at the discretion of the ultrasound technician under special circumstances. We apologize for any inconvenience.    Follow-Up: At [facility], you and your health needs are our priority.  As part of our continuing mission to provide you with exceptional heart care, we have created designated Provider Care Teams.  These Care Teams include your primary Cardiologist (physician) and Advanced Practice Providers (APPs -  Physician Assistants and Nurse Practitioners) who all work together to provide you with the care you need, when you need it.  We recommend signing up for the patient portal called "MyChart".  Sign up information is provided on this After Visit Summary.  MyChart is used to connect with patients for Virtual Visits (Telemedicine).  Patients are able to view lab/test results, encounter notes, upcoming appointments, etc.  Non-urgent messages can be sent to your provider as well.   To learn more about what you can do with MyChart, go to [email or web address].    Your next appointment:   1 year(s)  Provider:   Dr. Jere Monaco     Other Instructions [facility]  [address]  [place]  [place] [address] OPENING APRIL 28,2025       1st Floor: - Lobby - Registration  - Pharmacy  - Lab - Cafe   2nd Floor: - PV Lab - Diagnostic Testing (echo, CT, nuclear med)   3rd Floor: - Vacant   4th Floor: - TCTS (cardiothoracic surgery) - AFib Clinic - Structural Heart Clinic - Vascular Surgery  - Vascular Ultrasound   5th Floor: - HeartCare Cardiology (general and EP) - Clinical Pharmacy for coumadin , hypertension, lipid, weight-loss medications, and med management appointments      Valet parking services will be available as well.           Signed, Abagail Hoar, PA-C  07/06/2023 1:45 PM    [facility]

## 2023-07-07 ENCOUNTER — Telehealth (HOSPITAL_COMMUNITY): Payer: Self-pay

## 2023-07-07 ENCOUNTER — Encounter (HOSPITAL_COMMUNITY): Payer: Self-pay

## 2023-07-07 NOTE — Telephone Encounter (Signed)
 Attempted to call patient in regards to Cardiac Rehab - LM on VM Mailed letter

## 2023-07-13 ENCOUNTER — Telehealth (HOSPITAL_COMMUNITY): Payer: Self-pay

## 2023-07-13 LAB — MULTIPLE MYELOMA PANEL, SERUM
Albumin SerPl Elph-Mcnc: 3.7 g/dL (ref 2.9–4.4)
Albumin/Glob SerPl: 1.1 (ref 0.7–1.7)
Alpha 1: 0.3 g/dL (ref 0.0–0.4)
Alpha2 Glob SerPl Elph-Mcnc: 0.8 g/dL (ref 0.4–1.0)
B-Globulin SerPl Elph-Mcnc: 1 g/dL (ref 0.7–1.3)
Gamma Glob SerPl Elph-Mcnc: 1.6 g/dL (ref 0.4–1.8)
Globulin, Total: 3.7 g/dL (ref 2.2–3.9)
IgA/Immunoglobulin A, Serum: 281 mg/dL (ref 87–352)
IgG (Immunoglobin G), Serum: 1392 mg/dL (ref 586–1602)
IgM (Immunoglobulin M), Srm: 361 mg/dL — ABNORMAL HIGH (ref 26–217)
Total Protein: 7.4 g/dL (ref 6.0–8.5)

## 2023-07-13 NOTE — Telephone Encounter (Signed)
 Called patient to see if she was interested in participating in the Cardiac Rehab Program. Patient will come in for orientation on 5/1@9 :30 and will attend the 12:30 exercise class.   Sent package.

## 2023-07-14 LAB — UPEP/UIFE/LIGHT CHAINS/TP, 24-HR UR
% BETA, Urine: 0 %
ALBUMIN, U: 100 %
ALPHA 1 URINE: 0 %
ALPHA-2-GLOBULIN, U: 0 %
Free Kappa Lt Chains,Ur: 4.65 mg/L (ref 1.17–86.46)
Free Lambda Lt Chains,Ur: 1 mg/L (ref 0.27–15.21)
GAMMA GLOBULIN URINE: 0 %
Kappa/Lambda Ratio,U: 4.65 (ref 1.83–14.26)
Protein, 24H Urine: 122 mg/(24.h) (ref 30–150)
Protein, Ur: 10.2 mg/dL

## 2023-07-15 ENCOUNTER — Telehealth: Payer: Self-pay | Admitting: Cardiovascular Disease

## 2023-07-15 NOTE — Telephone Encounter (Signed)
 Hi Cassandra [name] - I am helping in preop today. We received a generic clearance request from dentist about dental cleanings/dental work. I would plan to advise that any specific procedures they need to reach out with what is planned/anesthesia, but I was wondering if there is an ideal waiting period post-TAVR where we advise to hold off cleanings or dental work until. Had TAVR 3/18. - Please route response to P CV DIV PREOP (the pre-op  pool). Thank you!  Additional reminder when circling back to chart: has doxycycline  rx'd for SBE ppx.

## 2023-07-15 NOTE — Telephone Encounter (Signed)
   Pre-operative Risk Assessment    Patient Name: Cassandra [name]  DOB: 10/30/1955 MRN: [id number]   Date of last office visit: 07/06/23 Date of next office visit: 12/07/23   Request for Surgical Clearance    Procedure:   Cleanings and Dental Work  Date of Surgery:  Clearance TBD                                Surgeon:  Dr. Threasa Flood Group or Practice Name:   [facility] Phone number:  [phone]  Fax number:  [phone]   Type of Clearance Requested:   - Medical  - Pharmacy:  Hold        Type of Anesthesia:  Local    Additional requests/questions:   Caller Jacqulyne Maxim) wants clearance for patient to have routine cleanings and dental work.  Signed, Jasmin B Wilson   07/15/2023, 10:36 AM

## 2023-07-15 NOTE — Telephone Encounter (Addendum)
   Patient Name: Cassandra [name]  DOB: Jun 09, 1955 MRN: [id number]  Primary Cardiologist: Ahmad Alert, MD  Chart reviewed as part of pre-operative protocol coverage. I reviewed her case with our structural heart PA.  Patient may proceed with standard dental work/cleanings with proper SBE prophylaxis (doxycycline  100mg  1 hour before dental work including cleanings) since she is now at least 4 weeks out from TAVR surgery (06/02/23). Patient has doxycycline  already prescribed.  If there are any procedures which would require anesthesia or holding of blood thinner, please reach out to our office with a formal clearance request with details of procedure.  Will route this bundled recommendation to requesting provider via Epic fax function. Please call with questions.  Traxton Kolenda N Kary Sugrue, PA-C 07/15/2023, 4:44 PM

## 2023-07-16 ENCOUNTER — Encounter (HOSPITAL_COMMUNITY)
Admission: RE | Admit: 2023-07-16 | Discharge: 2023-07-16 | Disposition: A | Discharge status: Home / Self Care | Source: Ambulatory Visit | Attending: Cardiovascular Disease | Admitting: Cardiovascular Disease

## 2023-07-16 ENCOUNTER — Telehealth (HOSPITAL_COMMUNITY): Payer: Self-pay | Admitting: *Deleted

## 2023-07-16 VITALS — BP 120/80 | HR 62 | Ht 64.0 in | Wt 228.6 lb

## 2023-07-16 DIAGNOSIS — Z952 Presence of prosthetic heart valve: Secondary | ICD-10-CM | POA: Insufficient documentation

## 2023-07-16 NOTE — Progress Notes (Signed)
 Cardiac Rehab Medication Review   Does the patient  feel that his/her medications are working for him/her?  yes  Has the patient been experiencing any side effects to the medications prescribed?  yes  Does the patient measure his/her own blood pressure or blood glucose at home?  no   Does the patient have any problems obtaining medications due to transportation or finances?   no  Understanding of regimen: excellent Understanding of indications: excellent Potential of compliance: excellent    Comments: Pt voices th Lexapro  makes her sleepy. No questions regarding medications.    [name] 07/16/2023 10:04 AM

## 2023-07-16 NOTE — Progress Notes (Signed)
 Cardiac Individual Treatment Plan  Patient Details  Name: Cassandra [name] MRN: [id number] t Date of Birth: [birth year] Referring Provider:   Flowsheet Row INTENSIVE CARDIAC REHAB ORIENT from 07/16/2023 in [facility]  Referring Provider Freddy Jain, MD       Initial Encounter Date:  Flowsheet Row INTENSIVE CARDIAC REHAB ORIENT from 07/16/2023 in [facility]  Date 07/16/23       Visit Diagnosis: 06/02/23 S/P TAVR (transcatheter aortic valve replacement)  Patient's Home Medications on Admission:  Current Outpatient Medications:    acetaminophen  (TYLENOL ) 500 MG tablet, Take 1,000 mg by mouth every 6 (six) hours as needed for moderate pain (pain score 4-6) or mild pain (pain score 1-3)., Disp: , Rfl:    albuterol  (VENTOLIN  HFA) 108 (90 Base) MCG/ACT inhaler, Inhale 2 puffs into the lungs every 6 (six) hours as needed (Congestion)., Disp: , Rfl:    alendronate (FOSAMAX) 70 MG tablet, Take 70 mg by mouth every Sunday. Take with a full glass of water on an empty stomach., Disp: , Rfl:    aspirin  EC 81 MG tablet, Take 1 tablet (81 mg total) by mouth daily. Swallow whole., Disp: , Rfl:    atorvastatin  (LIPITOR) 20 MG tablet, Take 20 mg by mouth at bedtime., Disp: , Rfl:    cetirizine (ZYRTEC) 10 MG tablet, Take 10 mg by mouth in the morning., Disp: , Rfl:    Cholecalciferol  25 MCG (1000 UT) tablet, Take 1,000 Units by mouth daily., Disp: , Rfl:    doxycycline  (VIBRA -TABS) 100 MG tablet, Take 1 tablet by mouth ONE HOUR BEFORE ANY DENTAL PROCEDURES, INCLUDING CLEANINGS, Disp: 3 tablet, Rfl: 1   escitalopram  (LEXAPRO ) 20 MG tablet, Take 20 mg by mouth in the morning., Disp: , Rfl:    ipratropium (ATROVENT) 0.06 % nasal spray, Place 1 spray into both nostrils at bedtime., Disp: , Rfl:    ketoconazole  (NIZORAL ) 2 % shampoo, Apply 1 Application topically 3 (three) times a week. Wash  scalp 3 time weekly, let sit 5 minutes and rinse out (Patient taking differently: Apply 1 Application topically every other day.), Disp: 120 mL, Rfl: 11   montelukast  (SINGULAIR ) 10 MG tablet, Take 10 mg by mouth at bedtime., Disp: , Rfl:    Multiple Vitamin (MULTI-VITAMIN) tablet, Take 1 tablet by mouth daily., Disp: , Rfl:    pantoprazole  (PROTONIX ) 40 MG tablet, Take 40 mg by mouth daily., Disp: , Rfl:    QULIPTA 60 MG TABS, Take 1 tablet by mouth daily., Disp: , Rfl:    Rimegepant Sulfate (NURTEC) 75 MG TBDP, Take 75 mg by mouth daily as needed., Disp: , Rfl:    telmisartan (MICARDIS) 20 MG tablet, Take 20 mg by mouth in the morning., Disp: , Rfl:   Past Medical History: Past Medical History:  Diagnosis Date   Allergy    Anxiety    Complication of anesthesia    difficulty waking up   Depression    GERD (gastroesophageal reflux disease)    Hypertension    Leukocytoclastic vasculitis (HCC)    Dr. Bertrum Brodie and Dr. 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 Abel Hoe & Dr. Honey Lusty   Severe aortic stenosis    Sleep apnea  cpap   SVT (supraventricular tachycardia) (HCC)    Type 2 diabetes mellitus with kidney complication, without long-term current use of insulin  (HCC) 05/07/2016   Verrucae vulgaris    Wegner's disease (congenital syphilitic osteochondritis)     Tobacco Use: Social History   Tobacco Use  Smoking Status Never   Passive exposure: Never  Smokeless Tobacco Never    Labs: Review Flowsheet       Latest Ref Rng & Units 04/10/2023 06/02/2023  Labs for ITP Cardiac and Pulmonary Rehab  PH, Arterial 7.35 - 7.45 7.364  -  PCO2 arterial 32 - 48 mmHg 46.4  -  Bicarbonate 20.0 - 28.0 mmol/L 25.1  24.9  26.4  -  TCO2 22 - 32 mmol/L 26  26  28  27  29    Acid-base deficit 0.0 - 2.0 mmol/L 1.0  1.0  -  O2 Saturation  % 68  71  92  -    Details       Multiple values from one day are sorted in reverse-chronological order         Capillary Blood Glucose: Lab Results  Component Value Date   GLUCAP 102 (H) 06/02/2023   GLUCAP 79 04/10/2023   GLUCAP 100 (H) 04/10/2023   GLUCAP 117 (H) 02/20/2017   GLUCAP 99 02/20/2017     Exercise Target Goals: Exercise Program Goal: Individual exercise prescription set using results from initial 6 min walk test and THRR while considering  patient's activity barriers and safety.   Exercise Prescription Goal: Initial exercise prescription builds to 30-45 minutes a day of aerobic activity, 2-3 days per week.  Home exercise guidelines will be given to patient during program as part of exercise prescription that the participant will acknowledge.  Activity Barriers & Risk Stratification:  Activity Barriers & Cardiac Risk Stratification - 07/16/23 1041       Activity Barriers & Cardiac Risk Stratification   Activity Barriers Balance Concerns;Arthritis;Joint Problems;Back Problems;Deconditioning;Left Knee Replacement;Right Knee Replacement;Shortness of Breath    Cardiac Risk Stratification High             6 Minute Walk:  6 Minute Walk     Row Name 07/16/23 1203         6 Minute Walk   Phase Initial     Distance 908 feet     Walk Time 6 minutes     # of Rest Breaks 1  2:25-3:40 ( pt made to stop, over THR)     MPH 1.72     METS 2     RPE 14     Perceived Dyspnea  2     VO2 Peak 7.1     Symptoms Yes (comment)     Comments SOB, PRD = 2, 6/10 bilateral foot pain     Resting HR 63 bpm     Resting BP 120/80     Resting Oxygen Saturation  97 %     Exercise Oxygen Saturation  during 6 min walk 96 %     Max Ex. HR 130 bpm     Max Ex. BP 140/76     2 Minute Post BP 126/76              Oxygen Initial Assessment:   Oxygen Re-Evaluation:   Oxygen Discharge (Final Oxygen Re-Evaluation):   Initial Exercise Prescription:  Initial Exercise  Prescription - 07/16/23 1000       Date of Initial Exercise RX and Referring Provider   Date 07/16/23  Referring Provider Freddy Jain, MD    Expected Discharge Date 10/07/23      Recumbant Bike   Level 1    RPM 60    Watts 25    Minutes 15    METs 2      NuStep   Level 1    SPM 75    Minutes 15    METs 2      Prescription Details   Frequency (times per week) 3    Duration Progress to 30 minutes of continuous aerobic without signs/symptoms of physical distress      Intensity   THRR 40-80% of Max Heartrate 61-122    Ratings of Perceived Exertion 11-13    Perceived Dyspnea 0-4      Progression   Progression Continue progressive overload as per policy without signs/symptoms or physical distress.      Resistance Training   Training Prescription Yes    Weight 2 lbs    Reps 10-15             Perform Capillary Blood Glucose checks as needed.  Exercise Prescription Changes:   Exercise Comments:   Exercise Goals and Review:   Exercise Goals     Row Name 07/16/23 1046             Exercise Goals   Increase Physical Activity Yes       Intervention Provide advice, education, support and counseling about physical activity/exercise needs.;Develop an individualized exercise prescription for aerobic and resistive training based on initial evaluation findings, risk stratification, comorbidities and participant's personal goals.       Expected Outcomes Short Term: Attend rehab on a regular basis to increase amount of physical activity.;Long Term: Exercising regularly at least 3-5 days a week.;Long Term: Add in home exercise to make exercise part of routine and to increase amount of physical activity.       Increase Strength and Stamina Yes       Intervention Provide advice, education, support and counseling about physical activity/exercise needs.;Develop an individualized exercise prescription for aerobic and resistive training based on initial evaluation findings,  risk stratification, comorbidities and participant's personal goals.       Expected Outcomes Short Term: Increase workloads from initial exercise prescription for resistance, speed, and METs.;Short Term: Perform resistance training exercises routinely during rehab and add in resistance training at home;Long Term: Improve cardiorespiratory fitness, muscular endurance and strength as measured by increased METs and functional capacity ( )       Able to understand and use rate of perceived exertion (RPE) scale Yes       Intervention Provide education and explanation on how to use RPE scale       Expected Outcomes Short Term: Able to use RPE daily in rehab to express subjective intensity level;Long Term:  Able to use RPE to guide intensity level when exercising independently       Knowledge and understanding of Target Heart Rate Range (THRR) Yes       Intervention Provide education and explanation of THRR including how the numbers were predicted and where they are located for reference       Expected Outcomes Short Term: Able to state/look up THRR;Long Term: Able to use THRR to govern intensity when exercising independently;Short Term: Able to use daily as guideline for intensity in rehab       Understanding of Exercise Prescription Yes       Intervention Provide education, explanation, and written materials on patient's individual  exercise prescription       Expected Outcomes Short Term: Able to explain program exercise prescription;Long Term: Able to explain home exercise prescription to exercise independently                Exercise Goals Re-Evaluation :   Discharge Exercise Prescription (Final Exercise Prescription Changes):   Nutrition:  Target Goals: Understanding of nutrition guidelines, daily intake of sodium 1500mg , cholesterol 200mg , calories 30% from fat and 7% or less from saturated fats, daily to have 5 or more servings of fruits and vegetables.  Biometrics:  Pre Biometrics -  07/16/23 1015       Pre Biometrics   Waist Circumference 51 inches    Hip Circumference 53 inches    Waist to Hip Ratio 0.96 %    Triceps Skinfold 44 mm    % Body Fat 53.4 %    Grip Strength 20 kg    Flexibility 10 in    Single Leg Stand 1 seconds              Nutrition Therapy Plan and Nutrition Goals:   Nutrition Assessments:  MEDIFICTS Score Key: >=70 Need to make dietary changes  40-70 Heart Healthy Diet <= 40 Therapeutic Level Cholesterol Diet    Picture Your Plate Scores: <84 Unhealthy dietary pattern with much room for improvement. 41-50 Dietary pattern unlikely to meet recommendations for good health and room for improvement. 51-60 More healthful dietary pattern, with some room for improvement.  >60 Healthy dietary pattern, although there may be some specific behaviors that could be improved.    Nutrition Goals Re-Evaluation:   Nutrition Goals Re-Evaluation:   Nutrition Goals Discharge (Final Nutrition Goals Re-Evaluation):   Psychosocial: Target Goals: Acknowledge presence or absence of significant depression and/or stress, maximize coping skills, provide positive support system. Participant is able to verbalize types and ability to use techniques and skills needed for reducing stress and depression.  Initial Review & Psychosocial Screening:  Initial Psych Review & Screening - 07/16/23 1047       Initial Review   Current issues with None Identified      Family Dynamics   Good Support System? Yes   Pt has her sister for support     Barriers   Psychosocial barriers to participate in program There are no identifiable barriers or psychosocial needs.      Screening Interventions   Interventions Encouraged to exercise             Quality of Life Scores:  Quality of Life - 07/16/23 1208       Quality of Life   Select Quality of Life      Quality of Life Scores   Health/Function Pre 22.69 %    Socioeconomic Pre 23.5 %     Psych/Spiritual Pre 26.57 %    Family Pre 25.5 %    GLOBAL Pre 24.13 %            Scores of 19 and below usually indicate a poorer quality of life in these areas.  A difference of  2-3 points is a clinically meaningful difference.  A difference of 2-3 points in the total score of the Quality of Life Index has been associated with significant improvement in overall quality of life, self-image, physical symptoms, and general health in studies assessing change in quality of life.  PHQ-9: Review Flowsheet       07/16/2023  Depression screen PHQ 2/9  Decreased Interest 0  Down, Depressed, Hopeless  0  PHQ - 2 Score 0  Altered sleeping 0  Tired, decreased energy 0  Change in appetite 0  Trouble concentrating 2  Moving slowly or fidgety/restless 0  Suicidal thoughts 0  PHQ-9 Score 2  Difficult doing work/chores Not difficult at all   Interpretation of Total Score  Total Score Depression Severity:  1-4 = Minimal depression, 5-9 = Mild depression, 10-14 = Moderate depression, 15-19 = Moderately severe depression, 20-27 = Severe depression   Psychosocial Evaluation and Intervention:   Psychosocial Re-Evaluation:   Psychosocial Discharge (Final Psychosocial Re-Evaluation):   Vocational Rehabilitation: Provide vocational rehab assistance to qualifying candidates.   Vocational Rehab Evaluation & Intervention:  Vocational Rehab - 07/16/23 1048       Initial Vocational Rehab Evaluation & Intervention   Assessment shows need for Vocational Rehabilitation No   Pt is retired            Education: Education Goals: Education classes will be provided on a weekly basis, covering required topics. Participant will state understanding/return demonstration of topics presented.     Core Videos: Exercise    Move It!  Clinical staff conducted group or individual video education with verbal and written material and guidebook.  Patient learns the recommended Pritikin exercise  program. Exercise with the goal of living a long, healthy life. Some of the health benefits of exercise include controlled diabetes, healthier blood pressure levels, improved cholesterol levels, improved heart and lung capacity, improved sleep, and better body composition. Everyone should speak with their doctor before starting or changing an exercise routine.  Biomechanical Limitations Clinical staff conducted group or individual video education with verbal and written material and guidebook.  Patient learns how biomechanical limitations can impact exercise and how we can mitigate and possibly overcome limitations to have an impactful and balanced exercise routine.  Body Composition Clinical staff conducted group or individual video education with verbal and written material and guidebook.  Patient learns that body composition (ratio of muscle mass to fat mass) is a key component to assessing overall fitness, rather than body weight alone. Increased fat mass, especially visceral belly fat, can put us  at increased risk for metabolic syndrome, type 2 diabetes, heart disease, and even death. It is recommended to combine diet and exercise (cardiovascular and resistance training) to improve your body composition. Seek guidance from your physician and exercise physiologist before implementing an exercise routine.  Exercise Action Plan Clinical staff conducted group or individual video education with verbal and written material and guidebook.  Patient learns the recommended strategies to achieve and enjoy long-term exercise adherence, including variety, self-motivation, self-efficacy, and positive decision making. Benefits of exercise include fitness, good health, weight management, more energy, better sleep, less stress, and overall well-being.  Medical   Heart Disease Risk Reduction Clinical staff conducted group or individual video education with verbal and written material and guidebook.  Patient  learns our heart is our most vital organ as it circulates oxygen, nutrients, white blood cells, and hormones throughout the entire body, and carries waste away. Data supports a plant-based eating plan like the Pritikin Program for its effectiveness in slowing progression of and reversing heart disease. The video provides a number of recommendations to address heart disease.   Metabolic Syndrome and Belly Fat  Clinical staff conducted group or individual video education with verbal and written material and guidebook.  Patient learns what metabolic syndrome is, how it leads to heart disease, and how one can reverse it and keep it from coming  back. You have metabolic syndrome if you have 3 of the following 5 criteria: abdominal obesity, high blood pressure, high triglycerides, low HDL cholesterol, and high blood sugar.  Hypertension and Heart Disease Clinical staff conducted group or individual video education with verbal and written material and guidebook.  Patient learns that high blood pressure, or hypertension, is very common in the [place] . Hypertension is largely due to excessive salt intake, but other important risk factors include being overweight, physical inactivity, drinking too much alcohol, smoking, and not eating enough potassium from fruits and vegetables. High blood pressure is a leading risk factor for heart attack, stroke, congestive heart failure, dementia, kidney failure, and premature death. Long-term effects of excessive salt intake include stiffening of the arteries and thickening of heart muscle and organ damage. Recommendations include ways to reduce hypertension and the risk of heart disease.  Diseases of Our Time - Focusing on Diabetes Clinical staff conducted group or individual video education with verbal and written material and guidebook.  Patient learns why the best way to stop diseases of our time is prevention, through food and other lifestyle changes. Medicine (such  as prescription pills and surgeries) is often only a Band-Aid on the problem, not a long-term solution. Most common diseases of our time include obesity, type 2 diabetes, hypertension, heart disease, and cancer. The Pritikin Program is recommended and has been proven to help reduce, reverse, and/or prevent the damaging effects of metabolic syndrome.  Nutrition   Overview of the Pritikin Eating Plan  Clinical staff conducted group or individual video education with verbal and written material and guidebook.  Patient learns about the Pritikin Eating Plan for disease risk reduction. The Pritikin Eating Plan emphasizes a wide variety of unrefined, minimally-processed carbohydrates, like fruits, vegetables, whole grains, and legumes. Go, Caution, and Stop food choices are explained. Plant-based and lean animal proteins are emphasized. Rationale provided for low sodium intake for blood pressure control, low added sugars for blood sugar stabilization, and low added fats and oils for coronary artery disease risk reduction and weight management.  Calorie Density  Clinical staff conducted group or individual video education with verbal and written material and guidebook.  Patient learns about calorie density and how it impacts the Pritikin Eating Plan. Knowing the characteristics of the food you choose will help you decide whether those foods will lead to weight gain or weight loss, and whether you want to consume more or less of them. Weight loss is usually a side effect of the Pritikin Eating Plan because of its focus on low calorie-dense foods.  Label Reading  Clinical staff conducted group or individual video education with verbal and written material and guidebook.  Patient learns about the Pritikin recommended label reading guidelines and corresponding recommendations regarding calorie density, added sugars, sodium content, and whole grains.  Dining Out - Part 1  Clinical staff conducted group or  individual video education with verbal and written material and guidebook.  Patient learns that restaurant meals can be sabotaging because they can be so high in calories, fat, sodium, and/or sugar. Patient learns recommended strategies on how to positively address this and avoid unhealthy pitfalls.  Facts on Fats  Clinical staff conducted group or individual video education with verbal and written material and guidebook.  Patient learns that lifestyle modifications can be just as effective, if not more so, as many medications for lowering your risk of heart disease. A Pritikin lifestyle can help to reduce your risk of inflammation and atherosclerosis (cholesterol  build-up, or plaque, in the artery walls). Lifestyle interventions such as dietary choices and physical activity address the cause of atherosclerosis. A review of the types of fats and their impact on blood cholesterol levels, along with dietary recommendations to reduce fat intake is also included.  Nutrition Action Plan  Clinical staff conducted group or individual video education with verbal and written material and guidebook.  Patient learns how to incorporate Pritikin recommendations into their lifestyle. Recommendations include planning and keeping personal health goals in mind as an important part of their success.  Healthy Mind-Set    Healthy Minds, Bodies, Hearts  Clinical staff conducted group or individual video education with verbal and written material and guidebook.  Patient learns how to identify when they are stressed. Video will discuss the impact of that stress, as well as the many benefits of stress management. Patient will also be introduced to stress management techniques. The way we think, act, and feel has an impact on our hearts.  How Our Thoughts Can Heal Our Hearts  Clinical staff conducted group or individual video education with verbal and written material and guidebook.  Patient learns that negative thoughts  can cause depression and anxiety. This can result in negative lifestyle behavior and serious health problems. Cognitive behavioral therapy is an effective method to help control our thoughts in order to change and improve our emotional outlook.  Additional Videos:  Exercise    Improving Performance  Clinical staff conducted group or individual video education with verbal and written material and guidebook.  Patient learns to use a non-linear approach by alternating intensity levels and lengths of time spent exercising to help burn more calories and lose more body fat. Cardiovascular exercise helps improve heart health, metabolism, hormonal balance, blood sugar control, and recovery from fatigue. Resistance training improves strength, endurance, balance, coordination, reaction time, metabolism, and muscle mass. Flexibility exercise improves circulation, posture, and balance. Seek guidance from your physician and exercise physiologist before implementing an exercise routine and learn your capabilities and proper form for all exercise.  Introduction to Yoga  Clinical staff conducted group or individual video education with verbal and written material and guidebook.  Patient learns about yoga, a discipline of the coming together of mind, breath, and body. The benefits of yoga include improved flexibility, improved range of motion, better posture and core strength, increased lung function, weight loss, and positive self-image. Yoga's heart health benefits include lowered blood pressure, healthier heart rate, decreased cholesterol and triglyceride levels, improved immune function, and reduced stress. Seek guidance from your physician and exercise physiologist before implementing an exercise routine and learn your capabilities and proper form for all exercise.  Medical   Aging: Enhancing Your Quality of Life  Clinical staff conducted group or individual video education with verbal and written material and  guidebook.  Patient learns key strategies and recommendations to stay in good physical health and enhance quality of life, such as prevention strategies, having an advocate, securing a Health Care Proxy and Power of Attorney, and keeping a list of medications and system for tracking them. It also discusses how to avoid risk for bone loss.  Biology of Weight Control  Clinical staff conducted group or individual video education with verbal and written material and guidebook.  Patient learns that weight gain occurs because we consume more calories than we burn (eating more, moving less). Even if your body weight is normal, you may have higher ratios of fat compared to muscle mass. Too much body fat puts  you at increased risk for cardiovascular disease, heart attack, stroke, type 2 diabetes, and obesity-related cancers. In addition to exercise, following the Pritikin Eating Plan can help reduce your risk.  Decoding Lab Results  Clinical staff conducted group or individual video education with verbal and written material and guidebook.  Patient learns that lab test reflects one measurement whose values change over time and are influenced by many factors, including medication, stress, sleep, exercise, food, hydration, pre-existing medical conditions, and more. It is recommended to use the knowledge from this video to become more involved with your lab results and evaluate your numbers to speak with your doctor.   Diseases of Our Time - Overview  Clinical staff conducted group or individual video education with verbal and written material and guidebook.  Patient learns that according to the CDC, 50% to 70% of chronic diseases (such as obesity, type 2 diabetes, elevated lipids, hypertension, and heart disease) are avoidable through lifestyle improvements including healthier food choices, listening to satiety cues, and increased physical activity.  Sleep Disorders Clinical staff conducted group or individual  video education with verbal and written material and guidebook.  Patient learns how good quality and duration of sleep are important to overall health and well-being. Patient also learns about sleep disorders and how they impact health along with recommendations to address them, including discussing with a physician.  Nutrition  Dining Out - Part 2 Clinical staff conducted group or individual video education with verbal and written material and guidebook.  Patient learns how to plan ahead and communicate in order to maximize their dining experience in a healthy and nutritious manner. Included are recommended food choices based on the type of restaurant the patient is visiting.   Fueling a Banker conducted group or individual video education with verbal and written material and guidebook.  There is a strong connection between our food choices and our health. Diseases like obesity and type 2 diabetes are very prevalent and are in large-part due to lifestyle choices. The Pritikin Eating Plan provides plenty of food and hunger-curbing satisfaction. It is easy to follow, affordable, and helps reduce health risks.  Menu Workshop  Clinical staff conducted group or individual video education with verbal and written material and guidebook.  Patient learns that restaurant meals can sabotage health goals because they are often packed with calories, fat, sodium, and sugar. Recommendations include strategies to plan ahead and to communicate with the manager, chef, or server to help order a healthier meal.  Planning Your Eating Strategy  Clinical staff conducted group or individual video education with verbal and written material and guidebook.  Patient learns about the Pritikin Eating Plan and its benefit of reducing the risk of disease. The Pritikin Eating Plan does not focus on calories. Instead, it emphasizes high-quality, nutrient-rich foods. By knowing the characteristics of the  foods, we choose, we can determine their calorie density and make informed decisions.  Targeting Your Nutrition Priorities  Clinical staff conducted group or individual video education with verbal and written material and guidebook.  Patient learns that lifestyle habits have a tremendous impact on disease risk and progression. This video provides eating and physical activity recommendations based on your personal health goals, such as reducing LDL cholesterol, losing weight, preventing or controlling type 2 diabetes, and reducing high blood pressure.  Vitamins and Minerals  Clinical staff conducted group or individual video education with verbal and written material and guidebook.  Patient learns different ways to obtain key vitamins  and minerals, including through a recommended healthy diet. It is important to discuss all supplements you take with your doctor.   Healthy Mind-Set    Smoking Cessation  Clinical staff conducted group or individual video education with verbal and written material and guidebook.  Patient learns that cigarette smoking and tobacco addiction pose a serious health risk which affects millions of people. Stopping smoking will significantly reduce the risk of heart disease, lung disease, and many forms of cancer. Recommended strategies for quitting are covered, including working with your doctor to develop a successful plan.  Culinary   Becoming a Set designer conducted group or individual video education with verbal and written material and guidebook.  Patient learns that cooking at home can be healthy, cost-effective, quick, and puts them in control. Keys to cooking healthy recipes will include looking at your recipe, assessing your equipment needs, planning ahead, making it simple, choosing cost-effective seasonal ingredients, and limiting the use of added fats, salts, and sugars.  Cooking - Breakfast and Snacks  Clinical staff conducted group or  individual video education with verbal and written material and guidebook.  Patient learns how important breakfast is to satiety and nutrition through the entire day. Recommendations include key foods to eat during breakfast to help stabilize blood sugar levels and to prevent overeating at meals later in the day. Planning ahead is also a key component.  Cooking - Educational psychologist conducted group or individual video education with verbal and written material and guidebook.  Patient learns eating strategies to improve overall health, including an approach to cook more at home. Recommendations include thinking of animal protein as a side on your plate rather than center stage and focusing instead on lower calorie dense options like vegetables, fruits, whole grains, and plant-based proteins, such as beans. Making sauces in large quantities to freeze for later and leaving the skin on your vegetables are also recommended to maximize your experience.  Cooking - Healthy Salads and Dressing Clinical staff conducted group or individual video education with verbal and written material and guidebook.  Patient learns that vegetables, fruits, whole grains, and legumes are the foundations of the Pritikin Eating Plan. Recommendations include how to incorporate each of these in flavorful and healthy salads, and how to create homemade salad dressings. Proper handling of ingredients is also covered. Cooking - Soups and [employer] - Soups and Desserts Clinical staff conducted group or individual video education with verbal and written material and guidebook.  Patient learns that Pritikin soups and desserts make for easy, nutritious, and delicious snacks and meal components that are low in sodium, fat, sugar, and calorie density, while high in vitamins, minerals, and filling fiber. Recommendations include simple and healthy ideas for soups and desserts.   Overview     The Pritikin Solution Program  Overview Clinical staff conducted group or individual video education with verbal and written material and guidebook.  Patient learns that the results of the Pritikin Program have been documented in more than 100 articles published in peer-reviewed journals, and the benefits include reducing risk factors for (and, in some cases, even reversing) high cholesterol, high blood pressure, type 2 diabetes, obesity, and more! An overview of the three key pillars of the Pritikin Program will be covered: eating well, doing regular exercise, and having a healthy mind-set.  WORKSHOPS  Exercise: Exercise Basics: Building Your Action Plan Clinical staff led group instruction and group discussion with PowerPoint presentation and patient guidebook.  To enhance the learning environment the use of posters, models and videos may be added. At the conclusion of this workshop, patients will comprehend the difference between physical activity and exercise, as well as the benefits of incorporating both, into their routine. Patients will understand the FITT (Frequency, Intensity, Time, and Type) principle and how to use it to build an exercise action plan. In addition, safety concerns and other considerations for exercise and cardiac rehab will be addressed by the presenter. The purpose of this lesson is to promote a comprehensive and effective weekly exercise routine in order to improve patients' overall level of fitness.   Managing Heart Disease: Your Path to a Healthier Heart Clinical staff led group instruction and group discussion with PowerPoint presentation and patient guidebook. To enhance the learning environment the use of posters, models and videos may be added.At the conclusion of this workshop, patients will understand the anatomy and physiology of the heart. Additionally, they will understand how Pritikin's three pillars impact the risk factors, the progression, and the management of heart disease.  The  purpose of this lesson is to provide a high-level overview of the heart, heart disease, and how the Pritikin lifestyle positively impacts risk factors.  Exercise Biomechanics Clinical staff led group instruction and group discussion with PowerPoint presentation and patient guidebook. To enhance the learning environment the use of posters, models and videos may be added. Patients will learn how the structural parts of their bodies function and how these functions impact their daily activities, movement, and exercise. Patients will learn how to promote a neutral spine, learn how to manage pain, and identify ways to improve their physical movement in order to promote healthy living. The purpose of this lesson is to expose patients to common physical limitations that impact physical activity. Participants will learn practical ways to adapt and manage aches and pains, and to minimize their effect on regular exercise. Patients will learn how to maintain good posture while sitting, walking, and lifting.  Balance Training and Fall Prevention  Clinical staff led group instruction and group discussion with PowerPoint presentation and patient guidebook. To enhance the learning environment the use of posters, models and videos may be added. At the conclusion of this workshop, patients will understand the importance of their sensorimotor skills (vision, proprioception, and the vestibular system) in maintaining their ability to balance as they age. Patients will apply a variety of balancing exercises that are appropriate for their current level of function. Patients will understand the common causes for poor balance, possible solutions to these problems, and ways to modify their physical environment in order to minimize their fall risk. The purpose of this lesson is to teach patients about the importance of maintaining balance as they age and ways to minimize their risk of falling.  WORKSHOPS   Nutrition:   Fueling a Ship broker led group instruction and group discussion with PowerPoint presentation and patient guidebook. To enhance the learning environment the use of posters, models and videos may be added. Patients will review the foundational principles of the Pritikin Eating Plan and understand what constitutes a serving size in each of the food groups. Patients will also learn Pritikin-friendly foods that are better choices when away from home and review make-ahead meal and snack options. Calorie density will be reviewed and applied to three nutrition priorities: weight maintenance, weight loss, and weight gain. The purpose of this lesson is to reinforce (in a group setting) the key concepts around what patients are recommended  to eat and how to apply these guidelines when away from home by planning and selecting Pritikin-friendly options. Patients will understand how calorie density may be adjusted for different weight management goals.  Mindful Eating  Clinical staff led group instruction and group discussion with PowerPoint presentation and patient guidebook. To enhance the learning environment the use of posters, models and videos may be added. Patients will briefly review the concepts of the Pritikin Eating Plan and the importance of low-calorie dense foods. The concept of mindful eating will be introduced as well as the importance of paying attention to internal hunger signals. Triggers for non-hunger eating and techniques for dealing with triggers will be explored. The purpose of this lesson is to provide patients with the opportunity to review the basic principles of the Pritikin Eating Plan, discuss the value of eating mindfully and how to measure internal cues of hunger and fullness using the Hunger Scale. Patients will also discuss reasons for non-hunger eating and learn strategies to use for controlling emotional eating.  Targeting Your Nutrition Priorities Clinical staff led  group instruction and group discussion with PowerPoint presentation and patient guidebook. To enhance the learning environment the use of posters, models and videos may be added. Patients will learn how to determine their genetic susceptibility to disease by reviewing their family history. Patients will gain insight into the importance of diet as part of an overall healthy lifestyle in mitigating the impact of genetics and other environmental insults. The purpose of this lesson is to provide patients with the opportunity to assess their personal nutrition priorities by looking at their family history, their own health history and current risk factors. Patients will also be able to discuss ways of prioritizing and modifying the Pritikin Eating Plan for their highest risk areas  Menu  Clinical staff led group instruction and group discussion with PowerPoint presentation and patient guidebook. To enhance the learning environment the use of posters, models and videos may be added. Using menus brought in from [employer], or printed from [employer], patients will apply the Pritikin dining out guidelines that were presented in the [employer] video. Patients will also be able to practice these guidelines in a variety of provided scenarios. The purpose of this lesson is to provide patients with the opportunity to practice hands-on learning of the Pritikin Dining Out guidelines with actual menus and practice scenarios.  Label Reading Clinical staff led group instruction and group discussion with PowerPoint presentation and patient guidebook. To enhance the learning environment the use of posters, models and videos may be added. Patients will review and discuss the Pritikin label reading guidelines presented in Pritikin's Label Reading Educational series video. Using fool labels brought in from local grocery stores and markets, patients will apply the label reading guidelines and determine  if the packaged food meet the Pritikin guidelines. The purpose of this lesson is to provide patients with the opportunity to review, discuss, and practice hands-on learning of the Pritikin Label Reading guidelines with actual packaged food labels. Cooking School  Pritikin's [employer] are designed to teach patients ways to prepare quick, simple, and affordable recipes at home. The importance of nutrition's role in chronic disease risk reduction is reflected in its emphasis in the overall Pritikin program. By learning how to prepare essential core Pritikin Eating Plan recipes, patients will increase control over what they eat; be able to customize the flavor of foods without the use of added salt, sugar, or fat; and improve the  quality of the food they consume. By learning a set of core recipes which are easily assembled, quickly prepared, and affordable, patients are more likely to prepare more healthy foods at home. These workshops focus on convenient breakfasts, simple entres, side dishes, and desserts which can be prepared with minimal effort and are consistent with nutrition recommendations for cardiovascular risk reduction. Cooking [employer] are taught by a Armed forces logistics/support/administrative officer (RD) who has been trained by the [employer]. The chef or RD has a clear understanding of the importance of minimizing - if not completely eliminating - added fat, sugar, and sodium in recipes. Throughout the series of Cooking School Workshop sessions, patients will learn about healthy ingredients and efficient methods of cooking to build confidence in their capability to prepare    Cooking School weekly topics:  Adding Flavor- Sodium-Free  Fast and Healthy Breakfasts  Powerhouse Plant-Based Proteins  Satisfying Salads and Dressings  Simple Sides and Sauces  International Cuisine-Spotlight on the [employer] Zones  Delicious Desserts  Savory Soups  [employer] - Meals in a  Astronomer Appetizers and Snacks  Comforting Weekend Breakfasts  One-Pot Wonders   Fast Evening Meals  Landscape architect Your Pritikin Plate  WORKSHOPS   Healthy Mindset (Psychosocial):  Focused Goals, Sustainable Changes Clinical staff led group instruction and group discussion with PowerPoint presentation and patient guidebook. To enhance the learning environment the use of posters, models and videos may be added. Patients will be able to apply effective goal setting strategies to establish at least one personal goal, and then take consistent, meaningful action toward that goal. They will learn to identify common barriers to achieving personal goals and develop strategies to overcome them. Patients will also gain an understanding of how our mind-set can impact our ability to achieve goals and the importance of cultivating a positive and growth-oriented mind-set. The purpose of this lesson is to provide patients with a deeper understanding of how to set and achieve personal goals, as well as the tools and strategies needed to overcome common obstacles which may arise along the way.  From Head to Heart: The Power of a Healthy Outlook  Clinical staff led group instruction and group discussion with PowerPoint presentation and patient guidebook. To enhance the learning environment the use of posters, models and videos may be added. Patients will be able to recognize and describe the impact of emotions and mood on physical health. They will discover the importance of self-care and explore self-care practices which may work for them. Patients will also learn how to utilize the 4 C's to cultivate a healthier outlook and better manage stress and challenges. The purpose of this lesson is to demonstrate to patients how a healthy outlook is an essential part of maintaining good health, especially as they continue their cardiac rehab journey.  Healthy Sleep for a Healthy Heart Clinical staff  led group instruction and group discussion with PowerPoint presentation and patient guidebook. To enhance the learning environment the use of posters, models and videos may be added. At the conclusion of this workshop, patients will be able to demonstrate knowledge of the importance of sleep to overall health, well-being, and quality of life. They will understand the symptoms of, and treatments for, common sleep disorders. Patients will also be able to identify daytime and nighttime behaviors which impact sleep, and they will be able to apply these tools to help manage sleep-related challenges. The purpose of this lesson is to provide patients  with a general overview of sleep and outline the importance of quality sleep. Patients will learn about a few of the most common sleep disorders. Patients will also be introduced to the concept of "sleep hygiene," and discover ways to self-manage certain sleeping problems through simple daily behavior changes. Finally, the workshop will motivate patients by clarifying the links between quality sleep and their goals of heart-healthy living.   Recognizing and Reducing Stress Clinical staff led group instruction and group discussion with PowerPoint presentation and patient guidebook. To enhance the learning environment the use of posters, models and videos may be added. At the conclusion of this workshop, patients will be able to understand the types of stress reactions, differentiate between acute and chronic stress, and recognize the impact that chronic stress has on their health. They will also be able to apply different coping mechanisms, such as reframing negative self-talk. Patients will have the opportunity to practice a variety of stress management techniques, such as deep abdominal breathing, progressive muscle relaxation, and/or guided imagery.  The purpose of this lesson is to educate patients on the role of stress in their lives and to provide healthy techniques  for coping with it.  Learning Barriers/Preferences:  Learning Barriers/Preferences - 07/16/23 1047       Learning Barriers/Preferences   Learning Barriers Hearing;Sight   Wears readinhg glasses, bilateral hearing aids   Learning Preferences Computer/Internet;Video;Written Material;Pictoral             Education Topics:  Knowledge Questionnaire Score:  Knowledge Questionnaire Score - 07/16/23 1208       Knowledge Questionnaire Score   Pre Score 20/24             Core Components/Risk Factors/Patient Goals at Admission:  Personal Goals and Risk Factors at Admission - 07/16/23 1049       Core Components/Risk Factors/Patient Goals on Admission    Weight Management Yes;Obesity;Weight Loss    Intervention Weight Management: Develop a combined nutrition and exercise program designed to reach desired caloric intake, while maintaining appropriate intake of nutrient and fiber, sodium and fats, and appropriate energy expenditure required for the weight goal.;Weight Management: Provide education and appropriate resources to help participant work on and attain dietary goals.;Weight Management/Obesity: Establish reasonable short term and long term weight goals.;Obesity: Provide education and appropriate resources to help participant work on and attain dietary goals.    Admit Weight 228 lb 9.9 oz (103.7 kg)    Goal Weight: Short Term 216 lb (98 kg)    Expected Outcomes Short Term: Continue to assess and modify interventions until short term weight is achieved;Long Term: Adherence to nutrition and physical activity/exercise program aimed toward attainment of established weight goal;Weight Loss: Understanding of general recommendations for a balanced deficit meal plan, which promotes 1-2 lb weight loss per week and includes a negative energy balance of [phone] kcal/d;Understanding recommendations for meals to include 15-35% energy as protein, 25-35% energy from fat, 35-60% energy from  carbohydrates, less than 200mg  of dietary cholesterol, 20-35 gm of total fiber daily;Understanding of distribution of calorie intake throughout the day with the consumption of 4-5 meals/snacks    Hypertension Yes    Intervention Provide education on lifestyle modifcations including regular physical activity/exercise, weight management, moderate sodium restriction and increased consumption of fresh fruit, vegetables, and low fat dairy, alcohol moderation, and smoking cessation.;Monitor prescription use compliance.    Expected Outcomes Short Term: Continued assessment and intervention until BP is < 140/29mm HG in hypertensive participants. < 130/10mm HG in hypertensive  participants with diabetes, heart failure or chronic kidney disease.;Long Term: Maintenance of blood pressure at goal levels.    Lipids Yes    Intervention Provide education and support for participant on nutrition & aerobic/resistive exercise along with prescribed medications to achieve LDL 70mg , HDL >40mg .    Expected Outcomes Short Term: Participant states understanding of desired cholesterol values and is compliant with medications prescribed. Participant is following exercise prescription and nutrition guidelines.;Long Term: Cholesterol controlled with medications as prescribed, with individualized exercise RX and with personalized nutrition plan. Value goals: LDL < 70mg , HDL > 40 mg.             Core Components/Risk Factors/Patient Goals Review:    Core Components/Risk Factors/Patient Goals at Discharge (Final Review):    ITP Comments:  ITP Comments     Row Name 07/16/23 1033           ITP Comments Gaylyn Keas, MD:  Introduction to the Pritikin Education Program / Intensive Cardiac Rehab.  Initial orientation packet reviewed with the patient.                Comments: Participant attended orientation for the cardiac rehabilitation program on  07/16/2023  to perform initial intake and exercise walk test. Patient  introduced to the [facility] education and orientation packet was reviewed. Completed 6-minute walk test, measurements, initial ITP, and exercise prescription. Vital signs stable. Telemetry-normal sinus rhythm, asymptomatic.   Service time was from 9:40 to 11:47.

## 2023-07-16 NOTE — Telephone Encounter (Signed)
 Left message to call cardiac rehab.Thayer Headings RN BSN

## 2023-07-22 ENCOUNTER — Encounter (HOSPITAL_COMMUNITY)
Admission: RE | Admit: 2023-07-22 | Discharge: 2023-07-22 | Disposition: A | Discharge status: Home / Self Care | Source: Ambulatory Visit | Attending: Cardiovascular Disease | Admitting: Cardiovascular Disease

## 2023-07-22 DIAGNOSIS — Z952 Presence of prosthetic heart valve: Secondary | ICD-10-CM | POA: Diagnosis not present

## 2023-07-22 NOTE — Progress Notes (Signed)
 Daily Session Note  Patient Details  Name: Cassandra [name] MRN: [id number] Date of Birth: 1955/05/21 Referring Provider:   Flowsheet Row INTENSIVE CARDIAC REHAB ORIENT from 07/16/2023 in [facility]  Referring Provider Freddy Jain, MD       Encounter Date: 07/22/2023  Check In:  Session Check In - 07/22/23 1306       Check-In   Supervising physician immediately available to respond to emergencies [facility] MD immediately available    Physician(s) Slater Duncan, NP    Location [facility]    Staff Present [name], MS, ACSM-CEP, CCRP, Exercise Physiologist;Bailey Martina Sledge, MS, Exercise Physiologist;Olinty Gaylene Kays, MS, ACSM-CEP, Exercise Physiologist;Johnny Alexia Angelucci, MS, Exercise Physiologist;Casey Dickson Founds, RN, BSN    Virtual Visit No    Medication changes reported     No    Fall or balance concerns reported    No    Tobacco Cessation No Change    Warm-up and Cool-down Performed as group-led instruction    Resistance Training Performed No    VAD Patient? No    PAD/SET Patient? No      Pain Assessment   Currently in Pain? No/denies    Pain Score 0-No pain    Multiple Pain Sites No             Capillary Blood Glucose: No results found for this or any previous visit (from the past 24 hours).   Exercise Prescription Changes - 07/22/23 1600       Response to Exercise   Blood Pressure (Admit) 122/70    Blood Pressure (Exercise) 140/80    Blood Pressure (Exit) 118/68    Heart Rate (Admit) 100 bpm    Heart Rate (Exercise) 120 bpm    Heart Rate (Exit) 75 bpm    Rating of Perceived Exertion (Exercise) 11    Symptoms None    Comments Pt's first day in the [facility] program    Duration Continue with 30 min of aerobic exercise without signs/symptoms of physical distress.    Intensity THRR unchanged      Progression   Progression Continue to progress workloads to maintain intensity  without signs/symptoms of physical distress.    Average METs 2.1      Resistance Training   Training Prescription No    Weight No weights on Wednesdays      Interval Training   Interval Training No      Recumbant Bike   Level 1    RPM 58    Watts 15    Minutes 15    METs 2.1      NuStep   Level 2    SPM 97    Minutes 15    METs 2.1             Social History   Tobacco Use  Smoking Status Never   Passive exposure: Never  Smokeless Tobacco Never    Goals Met:  Exercise tolerated well No report of concerns or symptoms today  Goals Unmet:  Not Applicable  Comments: Pt started cardiac rehab today.  Pt tolerated light exercise without difficulty. VSS, telemetry-Sinus Rhythm, asymptomatic.  Medication list reconciled. Pt denies barriers to medicaiton compliance.  PSYCHOSOCIAL ASSESSMENT:  PHQ-2. Pt exhibits positive coping skills, hopeful outlook with supportive family. No psychosocial needs identified at this time, no psychosocial interventions necessary.    Pt enjoys spending time with her dog, crocheting .   Pt oriented  to exercise equipment and routine.    Understanding verbalized.Monte Antonio RN BSN     Dr. Gaylyn Keas is Medical Director for Cardiac Rehab at [facility].

## 2023-07-24 ENCOUNTER — Other Ambulatory Visit: Payer: Self-pay

## 2023-07-24 ENCOUNTER — Emergency Department (HOSPITAL_COMMUNITY)

## 2023-07-24 ENCOUNTER — Encounter (HOSPITAL_COMMUNITY)
Admission: RE | Admit: 2023-07-24 | Discharge: 2023-07-24 | Disposition: A | Discharge status: Home / Self Care | Source: Ambulatory Visit | Attending: Cardiovascular Disease | Admitting: Cardiovascular Disease

## 2023-07-24 ENCOUNTER — Emergency Department (HOSPITAL_COMMUNITY)
Admission: EM | Admit: 2023-07-24 | Discharge: 2023-07-24 | Disposition: A | Discharge status: Home / Self Care | Attending: Emergency Medicine | Admitting: Emergency Medicine

## 2023-07-24 ENCOUNTER — Encounter (HOSPITAL_COMMUNITY): Payer: Self-pay

## 2023-07-24 DIAGNOSIS — Z96653 Presence of artificial knee joint, bilateral: Secondary | ICD-10-CM | POA: Insufficient documentation

## 2023-07-24 DIAGNOSIS — Z7982 Long term (current) use of aspirin: Secondary | ICD-10-CM | POA: Insufficient documentation

## 2023-07-24 DIAGNOSIS — Z952 Presence of prosthetic heart valve: Secondary | ICD-10-CM

## 2023-07-24 DIAGNOSIS — Z954 Presence of other heart-valve replacement: Secondary | ICD-10-CM | POA: Diagnosis not present

## 2023-07-24 DIAGNOSIS — R410 Disorientation, unspecified: Secondary | ICD-10-CM | POA: Diagnosis not present

## 2023-07-24 DIAGNOSIS — G43109 Migraine with aura, not intractable, without status migrainosus: Secondary | ICD-10-CM | POA: Diagnosis not present

## 2023-07-24 DIAGNOSIS — I10 Essential (primary) hypertension: Secondary | ICD-10-CM | POA: Insufficient documentation

## 2023-07-24 DIAGNOSIS — Z79899 Other long term (current) drug therapy: Secondary | ICD-10-CM | POA: Insufficient documentation

## 2023-07-24 DIAGNOSIS — G43909 Migraine, unspecified, not intractable, without status migrainosus: Secondary | ICD-10-CM

## 2023-07-24 DIAGNOSIS — R519 Headache, unspecified: Secondary | ICD-10-CM | POA: Diagnosis present

## 2023-07-24 DIAGNOSIS — E119 Type 2 diabetes mellitus without complications: Secondary | ICD-10-CM | POA: Diagnosis not present

## 2023-07-24 LAB — COMPREHENSIVE METABOLIC PANEL WITH GFR
ALT: 27 U/L (ref 0–44)
AST: 42 U/L — ABNORMAL HIGH (ref 15–41)
Albumin: 3.9 g/dL (ref 3.5–5.0)
Alkaline Phosphatase: 97 U/L (ref 38–126)
Anion gap: 12 (ref 5–15)
BUN: 20 mg/dL (ref 8–23)
CO2: 24 mmol/L (ref 22–32)
Calcium: 9.3 mg/dL (ref 8.9–10.3)
Chloride: 99 mmol/L (ref 98–111)
Creatinine, Ser: 1.17 mg/dL — ABNORMAL HIGH (ref 0.44–1.00)
GFR, Estimated: 51 mL/min — ABNORMAL LOW (ref >60)
Glucose, Bld: 90 mg/dL (ref 70–99)
Potassium: 4.6 mmol/L (ref 3.5–5.1)
Sodium: 135 mmol/L (ref 135–145)
Total Bilirubin: 1.3 mg/dL — ABNORMAL HIGH (ref 0.0–1.2)
Total Protein: 7.7 g/dL (ref 6.5–8.1)

## 2023-07-24 LAB — I-STAT CHEM 8, ED
BUN: 26 mg/dL — ABNORMAL HIGH (ref 8–23)
Calcium, Ion: 1.09 mmol/L — ABNORMAL LOW (ref 1.15–1.40)
Chloride: 100 mmol/L (ref 98–111)
Creatinine, Ser: 1.2 mg/dL — ABNORMAL HIGH (ref 0.44–1.00)
Glucose, Bld: 86 mg/dL (ref 70–99)
HCT: 39 % (ref 36.0–46.0)
Hemoglobin: 13.3 g/dL (ref 12.0–15.0)
Potassium: 4.6 mmol/L (ref 3.5–5.1)
Sodium: 136 mmol/L (ref 135–145)
TCO2: 28 mmol/L (ref 22–32)

## 2023-07-24 LAB — RAPID URINE DRUG SCREEN, HOSP PERFORMED
Amphetamines: NOT DETECTED
Barbiturates: NOT DETECTED
Benzodiazepines: NOT DETECTED
Cocaine: NOT DETECTED
Opiates: NOT DETECTED
Tetrahydrocannabinol: NOT DETECTED

## 2023-07-24 LAB — PROTIME-INR
INR: 1 (ref 0.8–1.2)
Prothrombin Time: 13.1 s (ref 11.4–15.2)

## 2023-07-24 LAB — APTT: aPTT: 24 s (ref 24–36)

## 2023-07-24 LAB — ETHANOL: Alcohol, Ethyl (B): <15 mg/dL (ref <15)

## 2023-07-24 MED ORDER — IOHEXOL 350 MG/ML SOLN
75.0000 mL | Freq: Once | INTRAVENOUS | Status: AC | PRN
  Administered 2023-07-24: 75 mL via INTRAVENOUS

## 2023-07-24 MED ORDER — SODIUM CHLORIDE 0.9 % IV SOLN: INTRAVENOUS | Status: DC

## 2023-07-24 MED ORDER — PROCHLORPERAZINE EDISYLATE 10 MG/2ML IJ SOLN
10.0000 mg | Freq: Once | INTRAMUSCULAR | Status: AC
  Administered 2023-07-24: 10 mg via INTRAVENOUS
  Filled 2023-07-24: qty 2

## 2023-07-24 MED ORDER — SODIUM CHLORIDE 0.9 % IV BOLUS
500.0000 mL | Freq: Once | INTRAVENOUS | Status: AC
Start: 2023-07-24 — End: 2023-07-24
  Administered 2023-07-24: 500 mL via INTRAVENOUS

## 2023-07-24 MED ORDER — DIPHENHYDRAMINE HCL 50 MG/ML IJ SOLN
12.5000 mg | Freq: Once | INTRAMUSCULAR | Status: AC
  Administered 2023-07-24: 12.5 mg via INTRAVENOUS
  Filled 2023-07-24: qty 1

## 2023-07-24 NOTE — ED Triage Notes (Signed)
 Pt BIB [facility] from home migraine and confusion starting at 1545. Per EMS, Pt was on her way home from cardiac rehab when she became confused and called her sister. EMS reports that pt had to be given directions home. Pt reports that migraines have become more frequent since valve replacement 2 months ago.  No other neuro sxs.  Pt took nurtec at 1400 130/90 60HR Cbg 107 20gRFA

## 2023-07-24 NOTE — ED Provider Notes (Signed)
 Patient is a 68 year old female with a history of Wegener's disease, Aloysius Artist classic vasculitis, hypertension, renal insufficiency, diabetes, aortic stenosis status post TAVR and history of migraines who is presenting today with abrupt onset of headache and confusion.  Patient reports she was at cardiac rehab and she was doing fine and then about 2:42 she had gone to the bathroom and reported she suddenly was not sure how to do things and was confused.  She was having a headache and reported her vision was off on the left side.  She called her family who reported she was asking how to get home and seemed off and not herself.  She is denying any unilateral numbness or weakness but when asking questions she just continues to repeat she still has a headache.  Does seem to have a mild visual field cut on the left.  No pronator drift noted, normal strength bilaterally.  Patient based on her timeline was last normal less than 3 hours ago and a code stroke was initiated based on exam findings and history.   Almond Army, MD 07/24/23 [phone]

## 2023-07-24 NOTE — Code Documentation (Addendum)
 Stroke Response Nurse Documentation Code Documentation  Cassandra [name] is a 68 y.o. female arriving to [facility]  via [facility] on 07/24/2023 with past medical hx of HTN CRI DM post TAVR and migraines. On aspirin  81 mg daily. Code stroke was activated by ED.   Patient from cardiac rehab where she was LKW at 1442 and now complaining of h/a, confusion, and possible vision loss on rt.  Labs drawn and patient cleared for CT by Dr. Leida Puna. Patient to CT with team. NIHSS 1, see documentation for details and code stroke times. Patient with right hemianopia on exam. The following imaging was completed:  CT Head, CTA, and MRI. Patient is not a candidate for IV Thrombolytic due to stroke not suspected. Patient is not a candidate for IR due to LVO negative.   Addendum: MRI neg per Dr. Murvin Arthurs. Code stroke alert cancelled.  Bedside handoff with ED RN Paden    Corrinne Din  Stroke Response RN

## 2023-07-24 NOTE — ED Provider Notes (Signed)
 [place] EMERGENCY DEPARTMENT AT [facility] Provider Note   CSN: [id number] Arrival date & time: 07/24/23  1702  An emergency department physician performed an initial assessment on this suspected stroke patient at 49.  History  Chief Complaint  Patient presents with   Altered Mental Status   Migraine    Cassandra [name] is a 68 y.o. female.  68 year old female presents with acute onset of headache along with some confusion.  States she has headaches daily.  Denies any focal weakness with this.  No recent fever or chills.  No neck pain.  No recent medication changes.  EMS was called and patient was seen initially as a code stroke.  Patient was seen by neurology and had negative MRI of brain as well as CT angio.  They felt that this is likely a migraine variant and recommended headache cocktail.  Patient states she feels back to her baseline.  Friend is at bedside who agrees       Home Medications Prior to Admission medications   Medication Sig Start Date End Date Taking? Authorizing Provider  acetaminophen  (TYLENOL ) 500 MG tablet Take 1,000 mg by mouth every 6 (six) hours as needed for moderate pain (pain score 4-6) or mild pain (pain score 1-3).    [provider]  albuterol  (VENTOLIN  HFA) 108 (90 Base) MCG/ACT inhaler Inhale 2 puffs into the lungs every 6 (six) hours as needed (Congestion).    [provider]  alendronate (FOSAMAX) 70 MG tablet Take 70 mg by mouth every Sunday. Take with a full glass of water on an empty stomach.    [provider]  aspirin  EC 81 MG tablet Take 1 tablet (81 mg total) by mouth daily. Swallow whole. 06/04/23   Ardia Kraft, PA-C  atorvastatin  (LIPITOR) 20 MG tablet Take 20 mg by mouth at bedtime. 07/15/21   [provider]  cetirizine (ZYRTEC) 10 MG tablet Take 10 mg by mouth in the morning.    [provider]  Cholecalciferol  25 MCG (1000 UT) tablet Take 1,000 Units by mouth daily.     [provider]  doxycycline  (VIBRA -TABS) 100 MG tablet Take 1 tablet by mouth ONE HOUR BEFORE ANY DENTAL PROCEDURES, INCLUDING CLEANINGS 06/11/23   Odie Benne, MD  escitalopram  (LEXAPRO ) 20 MG tablet Take 20 mg by mouth in the morning. 04/01/23   [provider]  ipratropium (ATROVENT) 0.06 % nasal spray Place 1 spray into both nostrils at bedtime.    [provider]  ketoconazole  (NIZORAL ) 2 % shampoo Apply 1 Application topically 3 (three) times a week. Wash scalp 3 time weekly, let sit 5 minutes and rinse out Patient taking differently: Apply 1 Application topically every other day. 02/27/23   Harris Liming, MD  montelukast  (SINGULAIR ) 10 MG tablet Take 10 mg by mouth at bedtime. 05/13/21   [provider]  Multiple Vitamin (MULTI-VITAMIN) tablet Take 1 tablet by mouth daily.    [provider]  pantoprazole  (PROTONIX ) 40 MG tablet Take 40 mg by mouth daily.    [provider]  QULIPTA 60 MG TABS Take 1 tablet by mouth daily. 06/08/23   [provider]  Rimegepant Sulfate (NURTEC) 75 MG TBDP Take 75 mg by mouth daily as needed.    [provider]  telmisartan (MICARDIS) 20 MG tablet Take 20 mg by mouth in the morning.    [provider]      Allergies    Nitrofurantoin , Effexor [venlafaxine], Myrbetriq [  mirabegron], Nsaids, Reglan [metoclopramide], Clarithromycin, Flagyl [metronidazole], Ketoprofen, Levofloxacin , Paxil [paroxetine], Tolterodine, Ampicillin, Cholecalciferol , Codeine, Fluoxetine hcl, Sulfa antibiotics, and Tetracycline    Review of Systems   Review of Systems  All other systems reviewed and are negative.   Physical Exam Updated Vital Signs BP (!) 147/81   Pulse 79   Temp 97.8 F (36.6 C) (Oral)   Resp 18   Ht 1.626 m (5\' 4" )   Wt 103.7 kg   SpO2 94%   BMI 39.24 kg/m  Physical Exam Vitals and nursing note reviewed.  Constitutional:      General: She is not in acute  distress.    Appearance: Normal appearance. She is well-developed. She is not toxic-appearing.  HENT:     Head: Normocephalic and atraumatic.  Eyes:     General: Lids are normal.     Conjunctiva/sclera: Conjunctivae normal.     Pupils: Pupils are equal, round, and reactive to light.  Neck:     Thyroid : No thyroid  mass.     Trachea: No tracheal deviation.  Cardiovascular:     Rate and Rhythm: Normal rate and regular rhythm.     Heart sounds: Normal heart sounds. No murmur heard.    No gallop.  Pulmonary:     Effort: Pulmonary effort is normal. No respiratory distress.     Breath sounds: Normal breath sounds. No stridor. No decreased breath sounds, wheezing, rhonchi or rales.  Abdominal:     General: There is no distension.     Palpations: Abdomen is soft.     Tenderness: There is no abdominal tenderness. There is no rebound.  Musculoskeletal:        General: No tenderness. Normal range of motion.     Cervical back: Normal range of motion and neck supple.  Skin:    General: Skin is warm and dry.     Findings: No abrasion or rash.  Neurological:     Mental Status: She is alert and oriented to person, place, and time. Mental status is at baseline.     GCS: GCS eye subscore is 4. GCS verbal subscore is 5. GCS motor subscore is 6.     Cranial Nerves: No cranial nerve deficit.     Sensory: No sensory deficit.     Motor: Motor function is intact.  Psychiatric:        Attention and Perception: Attention normal.        Speech: Speech normal.        Behavior: Behavior normal.     ED Results / Procedures / Treatments   Labs (all labs ordered are listed, but only abnormal results are displayed) Labs Reviewed  COMPREHENSIVE METABOLIC PANEL WITH GFR - Abnormal; Notable for the following components:      Result Value   Creatinine, Ser 1.17 (*)    AST 42 (*)    Total Bilirubin 1.3 (*)    GFR, Estimated 51 (*)    All other components within normal limits  I-STAT CHEM 8, ED -  Abnormal; Notable for the following components:   BUN 26 (*)    Creatinine, Ser 1.20 (*)    Calcium , Ion 1.09 (*)    All other components within normal limits  ETHANOL  PROTIME-INR  APTT  RAPID URINE DRUG SCREEN, HOSP PERFORMED  CBC WITH DIFFERENTIAL/PLATELET    EKG None  Radiology CT HEAD CODE STROKE WO CONTRAST Result Date: 07/24/2023 CLINICAL DATA:  Code stroke. Initial evaluation for acute neuro deficit, stroke suspected. EXAM:  CT HEAD WITHOUT CONTRAST TECHNIQUE: Contiguous axial images were obtained from the base of the skull through the vertex without intravenous contrast. RADIATION DOSE REDUCTION: This exam was performed according to the departmental dose-optimization program which includes automated exposure control, adjustment of the mA and/or kV according to patient size and/or use of iterative reconstruction technique. COMPARISON:  None Available. FINDINGS: Brain: Cerebral volume within normal limits. Patchy hypodensity involving the supratentorial cerebral white matter, most characteristic of chronic microvascular ischemic disease, moderate to advanced in nature. No acute intracranial hemorrhage. No visible cortically based infarct. No mass lesion, midline shift or mass effect. No hydrocephalus or extra-axial fluid collection. Vascular: No abnormal hyperdense vessel. Calcified atherosclerosis present at the skull base. Skull: Scalp soft tissues within normal limits.  Calvarium intact. Sinuses/Orbits: Right gaze preference noted. Paranasal sinuses are clear. No significant mastoid effusion. Other: None. ASPECTS [facility] Stroke Program Early CT Score) - Ganglionic level infarction (caudate, lentiform nuclei, internal capsule, insula, M1-M3 cortex): 7 - Supraganglionic infarction (M4-M6 cortex): 3 Total score (0-10 with 10 being normal): 10 IMPRESSION: 1. No acute intracranial abnormality. 2. ASPECTS is 10. 3. Patchy hypodensity involving the supratentorial cerebral white matter, most  characteristic of chronic small vessel ischemic disease. These results were communicated to Dr. Murvin Arthurs at 5:49 pm on 07/24/2023 by text page via the [facility] messaging system. Electronically Signed   By: Virgia Griffins M.D.   On: 07/24/2023 17:50    Procedures Procedures    Medications Ordered in ED Medications  sodium chloride  0.9 % bolus 500 mL (has no administration in time range)  0.9 %  sodium chloride  infusion (has no administration in time range)  prochlorperazine  (COMPAZINE ) injection 10 mg (has no administration in time range)  diphenhydrAMINE  (BENADRYL ) injection 12.5 mg (has no administration in time range)  iohexol  (OMNIPAQUE ) 350 MG/ML injection 75 mL (75 mLs Intravenous Contrast Given 07/24/23 1802)    ED Course/ Medical Decision Making/ A&P                                 Medical Decision Making Amount and/or Complexity of Data Reviewed ECG/medicine tests: ordered.  Risk Prescription drug management.   Patient called code stroke initially.  Seen by neurology and cleared.  Had negative MRI brain as well as CT angio head and neck.  The patient has been having headache and suspect migraine.  Was recommended to have migraine cocktail by neurology which was started patient feels much better at this time.  Neurological exam remained stable.  Will discharge home        Final Clinical Impression(s) / ED Diagnoses Final diagnoses:  None    Rx / DC Orders ED Discharge Orders     None         Lind Repine, MD 07/24/23 2149

## 2023-07-24 NOTE — ED Notes (Signed)
 Called [facility], spoke with Tequila, [id number] Bobbetta Burnet, AMS called @ [phone]

## 2023-07-24 NOTE — Consult Note (Signed)
 NEUROLOGY CONSULT NOTE   Date of service: Jul 24, 2023 Patient Name: Cassandra [name] MRN:  [id number] DOB:  15-Feb-1956 Chief Complaint: "CODE STROKE" Requesting Provider: No att. providers found  History of Present Illness  Cassandra [name] is a 68 y.o. 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 She reports going to cardiac rehab this morning and feeling fine, then started feeling like she was getting a migraine and took a nurtec once home and felt better. Then, about an hour later, her headache and symptoms returned. Based on her timeline and LKW being less than 3 hours ago, a CODE STROKE was called by EDP.  On exam, patient is awake and oriented, she has no facial droop or focal weakness, no sensory deficits. She has inconsistent responses with visual field testing on R side.  CTH and CTA negative. MRI negative, CODE STROKE was cancelled.   LKW: 1442 Modified rankin score: 0-Completely asymptomatic and back to baseline post- stroke IV Thrombolysis: no, inconsistent symptoms EVT: No, no LVO   NIHSS components Score: Comment  1a Level of Conscious 0[x]  1[]  2[]  3[]      1b LOC Questions 0[x]  1[]  2[]       1c LOC Commands 0[x]  1[]  2[]       2 Best Gaze 0[x]  1[]  2[]       3 Visual 0[]  1[x]  2[]  3[]      4 Facial Palsy 0[x]  1[]  2[]  3[]      5a Motor Arm - left 0[x]  1[]  2[]  3[]  4[]  UN[]    5b Motor Arm - Right 0[x]  1[]  2[]  3[]  4[]  UN[]    6a Motor Leg - Left 0[x]  1[]  2[]  3[]  4[]  UN[]    6b Motor Leg - Right 0[x]  1[]  2[]  3[]  4[]  UN[]    7 Limb Ataxia 0[x]  1[]  2[]  UN[]      8 Sensory 0[x]  1[]  2[]  UN[]      9 Best Language 0[x]  1[]  2[]  3[]      10 Dysarthria 0[x]  1[]  2[]  UN[]      11 Extinct. and Inattention 0[x]  1[]  2[]       TOTAL:   1      ROS  Comprehensive ROS performed and pertinent positives documented in HPI   Past History   Past Medical History:  Diagnosis Date   Allergy     Anxiety    Complication of anesthesia    difficulty waking up   Depression    GERD (gastroesophageal reflux disease)    Hypertension    Leukocytoclastic vasculitis (HCC)    Dr. Bertrum Brodie and Dr. 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 Abel Hoe & Dr. Honey Lusty   Severe aortic stenosis    Sleep apnea    cpap   SVT (supraventricular tachycardia) (HCC)    Type 2 diabetes mellitus with kidney complication, without long-term current use of insulin  (HCC) 05/07/2016   Verrucae vulgaris    Wegner's disease (congenital syphilitic osteochondritis)     Past Surgical History:  Procedure Laterality Date   BUNIONECTOMY Bilateral    CARPAL TUNNEL RELEASE Right 2002   INTRAOPERATIVE TRANSTHORACIC ECHOCARDIOGRAM N/A 06/02/2023   Procedure: ECHOCARDIOGRAM, TRANSTHORACIC;  Surgeon: Odie Benne, MD;  Location: [facility];  Service: Cardiovascular;  Laterality: N/A;  JOINT REPLACEMENT Right    knee   LOWER EXTREMITY VENOGRAPHY Right 07/14/2016   Procedure: Lower Extremity Venography;  Surgeon: Adine Hoof, MD;  Location: [facility] INVASIVE CV LAB;  Service: Cardiovascular;  Laterality: Right;   PERCUTANEOUS VENOUS THROMBECTOMY,LYSIS WITH INTRAVASCULAR ULTRASOUND (IVUS) Right 07/17/2016   Procedure: PERCUTANEOUS VENOUS THROMBECTOMY AND LYSIS WITH INTRAVASCULAR ULTRASOUND (IVUS) of right lower extermity with balloon angioplasty;  Surgeon: Adine Hoof, MD;  Location: [facility] OR;  Service: Vascular;  Laterality: Right;   RIGHT/LEFT HEART CATH AND CORONARY ANGIOGRAPHY N/A 04/10/2023   Procedure: RIGHT/LEFT HEART CATH AND CORONARY ANGIOGRAPHY;  Surgeon: Kyra Phy, MD;  Location: [facility];  Service: Cardiovascular;  Laterality: N/A;   SVT ABLATION N/A 02/20/2017   Procedure: SVT ABLATION;  Surgeon: Tammie Fall, MD;  Location: [facility];  Service: Cardiovascular;  Laterality: N/A;   TONSILLECTOMY  08/2002   TOTAL KNEE ARTHROPLASTY Right    TOTAL KNEE ARTHROPLASTY Left 02/21/2014   Procedure: LEFT TOTAL KNEE ARTHROPLASTY;  Surgeon: Alphonzo Ask, MD;  Location: [facility] OR;  Service: Orthopedics;  Laterality: Left;   TUBAL LIGATION  1980   ULTRASOUND GUIDANCE FOR VASCULAR ACCESS Right 07/17/2016   Procedure: ULTRASOUND GUIDANCE FOR VASCULAR ACCESS;  Surgeon: Adine Hoof, MD;  Location: [facility] OR;  Service: Vascular;  Laterality: Right;   VENOGRAM Right 07/17/2016   Procedure: right ASCENDING VENOGRAM WITH ANGIOJET;  Surgeon: Adine Hoof, MD;  Location: [facility] OR;  Service: Vascular;  Laterality: Right;    Family History: Family History  Problem Relation Age of Onset   Clotting disorder Mother 64       Blood clot, foot amputation   Cervical cancer Mother    Arthritis Sister    Heart attack Maternal Grandmother 65   Breast cancer Sister 65   Asthma Cousin     Social History  reports that she has never smoked. She has never been exposed to tobacco smoke. She has never used smokeless tobacco. She reports that she does not drink alcohol and does not use drugs.  Allergies  Allergen Reactions   Nitrofurantoin  Itching and Shortness Of Breath   Effexor [Venlafaxine] Hives   Myrbetriq [Mirabegron] Other (See Comments)    BACK PAIN DRY MOUTH   Nsaids Other (See Comments) and Hives    Hypersensitivity vasculitis VASCULITIS   Reglan [Metoclopramide] Other (See Comments)    Blister in mouth    Clarithromycin     Eyes and mouth irritation    Flagyl [Metronidazole] Hives   Ketoprofen Itching    Irritates eyes    Levofloxacin      Unknown reaction    Paxil [Paroxetine]     Deep itch   Tolterodine Other (See Comments)    Unknown reaction    Ampicillin Rash   Cholecalciferol  Rash    In high doses    Codeine Other (See Comments)    Head feels like its crawling    Fluoxetine Hcl Itching    Deep Itch    Sulfa Antibiotics Nausea And Vomiting and Other (See Comments)    Stomach irritation    Tetracycline Nausea And Vomiting    Severe stomach pain    Medications  No current facility-administered medications for this encounter.  Current Outpatient Medications:    acetaminophen  (TYLENOL ) 500 MG tablet, Take 1,000 mg by mouth every 6 (six) hours as needed for moderate pain (pain score 4-6) or mild pain (pain score 1-3)., Disp: , Rfl:  albuterol  (VENTOLIN  HFA) 108 (90 Base) MCG/ACT inhaler, Inhale 2 puffs into the lungs every 6 (six) hours as needed (Congestion)., Disp: , Rfl:    alendronate (FOSAMAX) 70 MG tablet, Take 70 mg by mouth every Sunday. Take with a full glass of water on an empty stomach., Disp: , Rfl:    aspirin  EC 81 MG tablet, Take 1 tablet (81 mg total) by mouth daily. Swallow whole., Disp: , Rfl:    atorvastatin  (LIPITOR) 20 MG tablet, Take 20 mg by mouth at bedtime., Disp: , Rfl:    cetirizine (ZYRTEC) 10 MG tablet, Take 10 mg by mouth in the morning., Disp: , Rfl:    Cholecalciferol  25 MCG (1000 UT) tablet, Take 1,000 Units by mouth daily., Disp: , Rfl:    doxycycline  (VIBRA -TABS) 100 MG tablet, Take 1 tablet by mouth ONE HOUR BEFORE ANY DENTAL PROCEDURES, INCLUDING CLEANINGS, Disp: 3 tablet, Rfl: 1   escitalopram  (LEXAPRO ) 20 MG tablet, Take 20 mg by mouth in the morning., Disp: , Rfl:    ipratropium (ATROVENT) 0.06 % nasal spray, Place 1 spray into both nostrils at bedtime., Disp: , Rfl:    ketoconazole  (NIZORAL ) 2 % shampoo, Apply 1 Application topically 3 (three) times a week. Wash scalp 3 time weekly, let sit 5 minutes and rinse out (Patient taking differently: Apply 1 Application topically every other day.), Disp: 120 mL, Rfl: 11   montelukast  (SINGULAIR ) 10 MG tablet, Take 10 mg by mouth at bedtime., Disp: , Rfl:    Multiple Vitamin (MULTI-VITAMIN) tablet, Take 1 tablet by mouth daily., Disp: , Rfl:    pantoprazole  (PROTONIX ) 40  MG tablet, Take 40 mg by mouth daily., Disp: , Rfl:    QULIPTA 60 MG TABS, Take 1 tablet by mouth daily., Disp: , Rfl:    Rimegepant Sulfate (NURTEC) 75 MG TBDP, Take 75 mg by mouth daily as needed., Disp: , Rfl:    telmisartan (MICARDIS) 20 MG tablet, Take 20 mg by mouth in the morning., Disp: , Rfl:   Vitals   Vitals:   29-Jul-2023 1714 29-Jul-2023 1716  BP: (!) 147/81   Pulse: 79   Resp: 18   Temp: 97.8 F (36.6 C)   TempSrc: Oral   SpO2: 94%   Weight:  103.7 kg  Height:  5\' 4"  (1.626 m)    Body mass index is 39.24 kg/m.  Physical Exam   Constitutional: Appears well-developed and well-nourished.  Psych: somewhat anxious Cardiovascular: Normal rate and regular rhythm.  Respiratory: Effort normal, non-labored breathing.    Neurologic Examination   Neuro: Mental Status: Patient is awake, alert, oriented to person, place, month, year, and situation. Patient is able to give a clear and coherent history. No signs of aphasia or neglect Cranial Nerves: II: Pupils are equal, round, and reactive to light. ?right hemianopsia, inconsistent responses during assessment.  III,IV, VI: EOMI without ptosis or diploplia.  V: Facial sensation is symmetric to temperature VII: Facial movement is symmetric.  VIII: hearing is intact to voice X: Uvula elevates symmetrically XI: Shoulder shrug is symmetric. XII: tongue is midline without atrophy or fasciculations.  Motor: Tone is normal. Bulk is normal. 5/5 strength was present in all four extremities.  Sensory: Sensation is symmetric to light touch in the arms and legs. Cerebellar: FNF intact bilaterally   Labs/Imaging/Neurodiagnostic studies   CBC:  Recent Labs  Lab 07/29/2023 1759  HGB 13.3  HCT 39.0   Basic Metabolic Panel:  Lab Results  Component Value Date   NA 136  07/24/2023   K 4.6 07/24/2023   CO2 26 06/03/2023   GLUCOSE 86 07/24/2023   BUN 26 (H) 07/24/2023   CREATININE 1.20 (H) 07/24/2023   CALCIUM  8.6 (L)  06/03/2023   GFRNONAA >60 06/03/2023   GFRAA 57 (L) 02/11/2017   Lipid Panel: No results found for: "LDLCALC" HgbA1c: No results found for: "HGBA1C" Urine Drug Screen: No results found for: "LABOPIA", "COCAINSCRNUR", "LABBENZ", "AMPHETMU", "THCU", "LABBARB"  Alcohol Level No results found for: "ETH" INR  Lab Results  Component Value Date   INR 1.0 07/24/2023   APTT  Lab Results  Component Value Date   APTT 84 (H) 07/13/2016   AED levels: No results found for: "PHENYTOIN", "ZONISAMIDE", "LAMOTRIGINE", "LEVETIRACETA"  CT Head without contrast(Personally reviewed): No acute abnormality  CT angio Head and Neck with contrast(Personally reviewed): No LVO   MRI Brain(Personally reviewed): Negative  ASSESSMENT   MEIRA KELP is a 68 y.o. 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 She reports started feeling like she was getting a migraine and took a nurtec once home and felt better. About an hour later, headache and symptoms returned.   On exam, patient is awake and oriented, she has no facial droop or focal weakness, no sensory deficits. She has inconsistent responses with visual field testing on R side.  CTH and CTA negative. MRI negative, CODE STROKE was cancelled.   Patient continues to complain of a headache and states she is very fatigued today.   Impression: Complicated Migraine  RECOMMENDATIONS   - migraine cocktail - IVF, avoid dehydration - electrolyte derangement correction ___________________________________________________________________    Signed, Audrene Lease, NP [facility]  NEUROHOSPITALIST ADDENDUM Performed a face to face diagnostic evaluation.   I have reviewed the contents of history and physical exam as documented by PA/ARNP/Resident and agree with above documentation.  I have discussed and formulated the above plan as documented.  Edits to the note have been made as needed.  Impression/Key exam findings/Plan: difficult neuro exam with inconsistent responses on vision testing. ED triage provider felt L hemianopsia, I felt she had inconsistent responses on the right. She is hard of hearing which complicates the evaluation. Highest suspicion for complicated migraine given her prior presentations. We took her to MRI Brain instead of offering tnkase. MRI brain was negative for an acute stroke.  Code stroke cancelled. Recommend migraine cocktail.  We will signoff. Okay to discharge home from a neuro stand point after headache control.  Jaizon Deroos, MD [facility] [phone]   If 7pm to 7am, please call on call as listed on AMION.

## 2023-07-27 ENCOUNTER — Encounter (HOSPITAL_COMMUNITY)
Admission: RE | Admit: 2023-07-27 | Discharge: 2023-07-27 | Disposition: A | Discharge status: Home / Self Care | Source: Ambulatory Visit | Attending: Cardiovascular Disease | Admitting: Cardiovascular Disease

## 2023-07-27 DIAGNOSIS — Z952 Presence of prosthetic heart valve: Secondary | ICD-10-CM

## 2023-07-27 NOTE — Progress Notes (Signed)
 Incomplete Session Note  Patient Details  Name: Cassandra [name] MRN: [id number] Date of Birth: 08-09-1955 Referring Provider:   Flowsheet Row INTENSIVE CARDIAC REHAB ORIENT from 07/16/2023 in [facility]  Referring Provider Freddy Jain, MD       Whitman Hampshire did not complete her rehab session.  It was noted that Jovonne went to the ED with confusion after leaving cardiac rehab on 07/24/23  a code stroke was called. Kyndall was discharged. Patient says she started having a headache when she left cardiac rehab on Friday. Charnell denies having any symptoms today. Blood pressure 122/70 heart rate 73 . Due to recent ED visit will hold exercise at cardiac rehab until the patient is cleared by her Primary care or neurology.  Lexey says that she has had more migraine headaches since her TAVR surgery in March. Patient's primary care provider's office called to obtain appointment. [place] [facility] office called. Patient has an appointment next Tuesday at 2:30 the office does not have any earlier appointment. Appointment obtained for Romina to see [facility] neurology on Monday. Patient states understanding and left cardiac rehab without complaints.Monte Antonio RN BSN

## 2023-07-29 ENCOUNTER — Encounter (HOSPITAL_COMMUNITY)

## 2023-07-31 ENCOUNTER — Encounter (HOSPITAL_COMMUNITY)

## 2023-08-03 ENCOUNTER — Encounter (HOSPITAL_COMMUNITY)

## 2023-08-04 NOTE — Progress Notes (Signed)
 Cardiac Individual Treatment Plan  Patient Details  Name: MAISY NEWPORT MRN: [id number] Date of Birth: 1955/10/19 Referring Provider:   Flowsheet Row INTENSIVE CARDIAC REHAB ORIENT from 07/16/2023 in [facility], V[facility]  Referring Provider Freddy Jain, MD       Initial Encounter Date:  Flowsheet Row INTENSIVE CARDIAC REHAB ORIENT from 07/16/2023 in [facility] for Heart, Vascular, & Lung Health  Date 07/16/23       Visit Diagnosis: 06/02/23 S/P TAVR (transcatheter aortic valve replacement)  Patient's Home Medications on Admission:  Current Outpatient Medications:    acetaminophen  (TYLENOL ) 500 MG tablet, Take 1,000 mg by mouth every 6 (six) hours as needed for moderate pain (pain score 4-6) or mild pain (pain score 1-3)., Disp: , Rfl:    albuterol  (VENTOLIN  HFA) 108 (90 Base) MCG/ACT inhaler, Inhale 2 puffs into the lungs every 6 (six) hours as needed (Congestion)., Disp: , Rfl:    alendronate (FOSAMAX) 70 MG tablet, Take 70 mg by mouth every Sunday. Take with a full glass of water on an empty stomach., Disp: , Rfl:    aspirin  EC 81 MG tablet, Take 1 tablet (81 mg total) by mouth daily. Swallow whole., Disp: , Rfl:    atorvastatin  (LIPITOR) 20 MG tablet, Take 20 mg by mouth at bedtime., Disp: , Rfl:    cetirizine (ZYRTEC) 10 MG tablet, Take 10 mg by mouth in the morning., Disp: , Rfl:    Cholecalciferol  25 MCG (1000 UT) tablet, Take 1,000 Units by mouth daily., Disp: , Rfl:    doxycycline  (VIBRA -TABS) 100 MG tablet, Take 1 tablet by mouth ONE HOUR BEFORE ANY DENTAL PROCEDURES, INCLUDING CLEANINGS, Disp: 3 tablet, Rfl: 1   escitalopram  (LEXAPRO ) 20 MG tablet, Take 20 mg by mouth in the morning., Disp: , Rfl:    ipratropium (ATROVENT) 0.06 % nasal spray, Place 1 spray into both nostrils at bedtime., Disp: , Rfl:    ketoconazole  (NIZORAL ) 2 % shampoo, Apply 1 Application topically 3 (three) times a week. Wash scalp  3 time weekly, let sit 5 minutes and rinse out (Patient taking differently: Apply 1 Application topically every other day.), Disp: 120 mL, Rfl: 11   montelukast  (SINGULAIR ) 10 MG tablet, Take 10 mg by mouth at bedtime., Disp: , Rfl:    Multiple Vitamin (MULTI-VITAMIN) tablet, Take 1 tablet by mouth daily., Disp: , Rfl:    pantoprazole  (PROTONIX ) 40 MG tablet, Take 40 mg by mouth daily., Disp: , Rfl:    QULIPTA 60 MG TABS, Take 1 tablet by mouth daily., Disp: , Rfl:    Rimegepant Sulfate (NURTEC) 75 MG TBDP, Take 75 mg by mouth daily as needed (for migraines)., Disp: , Rfl:    telmisartan (MICARDIS) 20 MG tablet, Take 20 mg by mouth in the morning., Disp: , Rfl:   Past Medical History: Past Medical History:  Diagnosis Date   Allergy    Anxiety    Complication of anesthesia    difficulty waking up   Depression    GERD (gastroesophageal reflux disease)    Hypertension    Leukocytoclastic vasculitis (HCC)    Dr. Bertrum Brodie and Dr. 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 Abel Hoe & Dr. Honey Lusty   Severe aortic stenosis  Sleep apnea    cpap   SVT (supraventricular tachycardia) (HCC)    Type 2 diabetes mellitus with kidney complication, without long-term current use of insulin  (HCC) 05/07/2016   Verrucae vulgaris    Wegner's disease (congenital syphilitic osteochondritis)     Tobacco Use: Social History   Tobacco Use  Smoking Status Never   Passive exposure: Never  Smokeless Tobacco Never    Labs: Review Flowsheet       Latest Ref Rng & Units 04/10/2023 06/02/2023 07/24/2023  Labs for ITP Cardiac and Pulmonary Rehab  PH, Arterial 7.35 - 7.45 7.364  - -  PCO2 arterial 32 - 48 mmHg 46.4  - -  Bicarbonate 20.0 - 28.0 mmol/L 25.1  24.9  26.4  - -  TCO2 22 - 32 mmol/L 26  26  28  27  29  28    Acid-base deficit 0.0 - 2.0  mmol/L 1.0  1.0  - -  O2 Saturation % 68  71  92  - -    Details       Multiple values from one day are sorted in reverse-chronological order         Capillary Blood Glucose: Lab Results  Component Value Date   GLUCAP 102 (H) 06/02/2023   GLUCAP 79 04/10/2023   GLUCAP 100 (H) 04/10/2023   GLUCAP 117 (H) 02/20/2017   GLUCAP 99 02/20/2017     Exercise Target Goals: Exercise Program Goal: Individual exercise prescription set using results from initial 6 min walk test and THRR while considering  patient's activity barriers and safety.   Exercise Prescription Goal: Initial exercise prescription builds to 30-45 minutes a day of aerobic activity, 2-3 days per week.  Home exercise guidelines will be given to patient during program as part of exercise prescription that the participant will acknowledge.  Activity Barriers & Risk Stratification:  Activity Barriers & Cardiac Risk Stratification - 07/16/23 1041       Activity Barriers & Cardiac Risk Stratification   Activity Barriers Balance Concerns;Arthritis;Joint Problems;Back Problems;Deconditioning;Left Knee Replacement;Right Knee Replacement;Shortness of Breath    Cardiac Risk Stratification High             6 Minute Walk:  6 Minute Walk     Row Name 07/16/23 1203         6 Minute Walk   Phase Initial     Distance 908 feet     Walk Time 6 minutes     # of Rest Breaks 1  2:25-3:40 ( pt made to stop, over THR)     MPH 1.72     METS 2     RPE 14     Perceived Dyspnea  2     VO2 Peak 7.1     Symptoms Yes (comment)     Comments SOB, PRD = 2, 6/10 bilateral foot pain     Resting HR 63 bpm     Resting BP 120/80     Resting Oxygen Saturation  97 %     Exercise Oxygen Saturation  during 6 min walk 96 %     Max Ex. HR 130 bpm     Max Ex. BP 140/76     2 Minute Post BP 126/76              Oxygen Initial Assessment:   Oxygen Re-Evaluation:   Oxygen Discharge (Final Oxygen Re-Evaluation):   Initial  Exercise Prescription:  Initial Exercise Prescription - 07/16/23 1000  Date of Initial Exercise RX and Referring Provider   Date 07/16/23    Referring Provider Freddy Jain, MD    Expected Discharge Date 10/07/23      Recumbant Bike   Level 1    RPM 60    Watts 25    Minutes 15    METs 2      NuStep   Level 1    SPM 75    Minutes 15    METs 2      Prescription Details   Frequency (times per week) 3    Duration Progress to 30 minutes of continuous aerobic without signs/symptoms of physical distress      Intensity   THRR 40-80% of Max Heartrate 61-122    Ratings of Perceived Exertion 11-13    Perceived Dyspnea 0-4      Progression   Progression Continue progressive overload as per policy without signs/symptoms or physical distress.      Resistance Training   Training Prescription Yes    Weight 2 lbs    Reps 10-15             Perform Capillary Blood Glucose checks as needed.  Exercise Prescription Changes:   Exercise Prescription Changes     Row Name 07/22/23 1600             Response to Exercise   Blood Pressure (Admit) 122/70       Blood Pressure (Exercise) 140/80       Blood Pressure (Exit) 118/68       Heart Rate (Admit) 100 bpm       Heart Rate (Exercise) 120 bpm       Heart Rate (Exit) 75 bpm       Rating of Perceived Exertion (Exercise) 11       Symptoms None       Comments Pt's first day in the CRP2 program       Duration Continue with 30 min of aerobic exercise without signs/symptoms of physical distress.       Intensity THRR unchanged         Progression   Progression Continue to progress workloads to maintain intensity without signs/symptoms of physical distress.       Average METs 2.1         Resistance Training   Training Prescription No       Weight No weights on Wednesdays         Interval Training   Interval Training No         Recumbant Bike   Level 1       RPM 58       Watts 15       Minutes 15       METs 2.1          NuStep   Level 2       SPM 97       Minutes 15       METs 2.1                Exercise Comments:   Exercise Comments     Row Name 07/22/23 1625           Exercise Comments Pt's first day in the CRP2 program. Pt exercised without complaints.                Exercise Goals and Review:   Exercise Goals     Row Name 07/16/23 1046  Exercise Goals   Increase Physical Activity Yes       Intervention Provide advice, education, support and counseling about physical activity/exercise needs.;Develop an individualized exercise prescription for aerobic and resistive training based on initial evaluation findings, risk stratification, comorbidities and participant's personal goals.       Expected Outcomes Short Term: Attend rehab on a regular basis to increase amount of physical activity.;Long Term: Exercising regularly at least 3-5 days a week.;Long Term: Add in home exercise to make exercise part of routine and to increase amount of physical activity.       Increase Strength and Stamina Yes       Intervention Provide advice, education, support and counseling about physical activity/exercise needs.;Develop an individualized exercise prescription for aerobic and resistive training based on initial evaluation findings, risk stratification, comorbidities and participant's personal goals.       Expected Outcomes Short Term: Increase workloads from initial exercise prescription for resistance, speed, and METs.;Short Term: Perform resistance training exercises routinely during rehab and add in resistance training at home;Long Term: Improve cardiorespiratory fitness, muscular endurance and strength as measured by increased METs and functional capacity ( )       Able to understand and use rate of perceived exertion (RPE) scale Yes       Intervention Provide education and explanation on how to use RPE scale       Expected Outcomes Short Term: Able to use RPE daily in  rehab to express subjective intensity level;Long Term:  Able to use RPE to guide intensity level when exercising independently       Knowledge and understanding of Target Heart Rate Range (THRR) Yes       Intervention Provide education and explanation of THRR including how the numbers were predicted and where they are located for reference       Expected Outcomes Short Term: Able to state/look up THRR;Long Term: Able to use THRR to govern intensity when exercising independently;Short Term: Able to use daily as guideline for intensity in rehab       Understanding of Exercise Prescription Yes       Intervention Provide education, explanation, and written materials on patient's individual exercise prescription       Expected Outcomes Short Term: Able to explain program exercise prescription;Long Term: Able to explain home exercise prescription to exercise independently                Exercise Goals Re-Evaluation :  Exercise Goals Re-Evaluation     Row Name 07/22/23 1624             Exercise Goal Re-Evaluation   Exercise Goals Review Increase Physical Activity;Increase Strength and Stamina;Able to understand and use rate of perceived exertion (RPE) scale;Knowledge and understanding of Target Heart Rate Range (THRR);Understanding of Exercise Prescription       Comments Pt's first day in the CRP2 program. Pt understands the exercise Rx, RPE scale and THRR.       Expected Outcomes Will continue to monitor patient and progress exercise workloads as tolerated.                Discharge Exercise Prescription (Final Exercise Prescription Changes):  Exercise Prescription Changes - 07/22/23 1600       Response to Exercise   Blood Pressure (Admit) 122/70    Blood Pressure (Exercise) 140/80    Blood Pressure (Exit) 118/68    Heart Rate (Admit) 100 bpm    Heart Rate (Exercise) 120 bpm    Heart  Rate (Exit) 75 bpm    Rating of Perceived Exertion (Exercise) 11    Symptoms None    Comments  Pt's first day in the CRP2 program    Duration Continue with 30 min of aerobic exercise without signs/symptoms of physical distress.    Intensity THRR unchanged      Progression   Progression Continue to progress workloads to maintain intensity without signs/symptoms of physical distress.    Average METs 2.1      Resistance Training   Training Prescription No    Weight No weights on Wednesdays      Interval Training   Interval Training No      Recumbant Bike   Level 1    RPM 58    Watts 15    Minutes 15    METs 2.1      NuStep   Level 2    SPM 97    Minutes 15    METs 2.1             Nutrition:  Target Goals: Understanding of nutrition guidelines, daily intake of sodium 1500mg , cholesterol 200mg , calories 30% from fat and 7% or less from saturated fats, daily to have 5 or more servings of fruits and vegetables.  Biometrics:  Pre Biometrics - 07/16/23 1015       Pre Biometrics   Waist Circumference 51 inches    Hip Circumference 53 inches    Waist to Hip Ratio 0.96 %    Triceps Skinfold 44 mm    % Body Fat 53.4 %    Grip Strength 20 kg    Flexibility 10 in    Single Leg Stand 1 seconds              Nutrition Therapy Plan and Nutrition Goals:  Nutrition Therapy & Goals - 07/23/23 0942       Nutrition Therapy   Diet Heart healthy diet    Drug/Food Interactions Statins/Certain Fruits      Personal Nutrition Goals   Nutrition Goal Patient to identify strategies for reducing cardiovascular risk by attending the Pritikin education and nutrition series weekly.    Personal Goal #2 Patient to improve diet quality by using the plate method as a guide for meal planning to include lean protein/plant protein, fruits, vegetables, whole grains, nonfat dairy as part of a well-balanced diet.    Personal Goal #3 Patient to identify strategies for weight loss of 0.5-2.0# per week.    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 Lipids are WNL. A1c is in a prediabetic range.  Patient will benefit from participation in intensive cardiac rehab for nutrition, exercise, and lifestyle modification.      Intervention Plan   Intervention Prescribe, educate and counsel regarding individualized specific dietary modifications aiming towards targeted core components such as weight, hypertension, lipid management, diabetes, heart failure and other comorbidities.;Nutrition handout(s) given to patient.    Expected Outcomes Short Term Goal: Understand basic principles of dietary content, such as calories, fat, sodium, cholesterol and nutrients.;Long Term Goal: Adherence to prescribed nutrition plan.             Nutrition Assessments:  Nutrition Assessments - 07/23/23 1510       Rate Your Plate Scores   Pre Score 54            MEDIFICTS Score  Key: >=70 Need to make dietary changes  40-70 Heart Healthy Diet <= 40 Therapeutic Level Cholesterol Diet   Flowsheet Row INTENSIVE CARDIAC REHAB from 07/22/2023 in [facility], [facility] & [facility]  Picture Your Plate Total Score on Admission 54      Picture Your Plate Scores: <25 Unhealthy dietary pattern with much room for improvement. 41-50 Dietary pattern unlikely to meet recommendations for good health and room for improvement. 51-60 More healthful dietary pattern, with some room for improvement.  >60 Healthy dietary pattern, although there may be some specific behaviors that could be improved.    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 Lipids are WNL. A1c is in a prediabetic range. Patient will benefit from participation in intensive cardiac rehab for nutrition, exercise, and lifestyle modification.                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 Lipids are WNL. A1c is in a prediabetic range. Patient will benefit from participation in intensive cardiac rehab for nutrition, exercise, and lifestyle modification.                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 Lipids are WNL. A1c is in a prediabetic range. Patient will benefit from participation in intensive cardiac rehab for nutrition, exercise, and lifestyle modification.             Psychosocial: Target Goals: Acknowledge presence or absence of significant depression and/or stress, maximize coping skills, provide positive support system. Participant is able to verbalize types and ability to use techniques and skills needed for reducing stress and depression.  Initial Review &  Psychosocial Screening:  Initial Psych Review & Screening - 07/16/23 1047       Initial Review   Current issues with None Identified      Family Dynamics   Good Support System? Yes   Pt has her sister for support     Barriers   Psychosocial barriers to participate in program There are no identifiable barriers or psychosocial needs.      Screening Interventions   Interventions Encouraged  to exercise             Quality of Life Scores:  Quality of Life - 07/16/23 1208       Quality of Life   Select Quality of Life      Quality of Life Scores   Health/Function Pre 22.69 %    Socioeconomic Pre 23.5 %    Psych/Spiritual Pre 26.57 %    Family Pre 25.5 %    GLOBAL Pre 24.13 %            Scores of 19 and below usually indicate a poorer quality of life in these areas.  A difference of  2-3 points is a clinically meaningful difference.  A difference of 2-3 points in the total score of the Quality of Life Index has been associated with significant improvement in overall quality of life, self-image, physical symptoms, and general health in studies assessing change in quality of life.  PHQ-9: Review Flowsheet       07/16/2023  Depression screen PHQ 2/9  Decreased Interest 0  Down, Depressed, Hopeless 0  PHQ - 2 Score 0  Altered sleeping 0  Tired, decreased energy 0  Change in appetite 0  Trouble concentrating 2  Moving slowly or fidgety/restless 0  Suicidal thoughts 0  PHQ-9 Score 2  Difficult doing work/chores Not difficult at all   Interpretation of Total Score  Total Score Depression Severity:  1-4 = Minimal depression, 5-9 = Mild depression, 10-14 = Moderate depression, 15-19 = Moderately severe depression, 20-27 = Severe depression   Psychosocial Evaluation and Intervention:   Psychosocial Re-Evaluation:  Psychosocial Re-Evaluation     Row Name 07/23/23 1001 08/04/23 1613           Psychosocial Re-Evaluation   Current issues with None Identified  Current Stress Concerns      Comments -- Exercise is currently on hold unable to reevaluate      Interventions Encouraged to attend Cardiac Rehabilitation for the exercise Encouraged to attend Cardiac Rehabilitation for the exercise      Continue Psychosocial Services  No Follow up required No Follow up required        Initial Review   Source of Stress Concerns -- Chronic Illness               Psychosocial Discharge (Final Psychosocial Re-Evaluation):  Psychosocial Re-Evaluation - 08/04/23 1613       Psychosocial Re-Evaluation   Current issues with Current Stress Concerns    Comments Exercise is currently on hold unable to reevaluate    Interventions Encouraged to attend Cardiac Rehabilitation for the exercise    Continue Psychosocial Services  No Follow up required      Initial Review   Source of Stress Concerns Chronic Illness             Vocational Rehabilitation: Provide vocational rehab assistance to qualifying candidates.   Vocational Rehab Evaluation & Intervention:  Vocational Rehab - 07/16/23 1048       Initial Vocational Rehab Evaluation & Intervention   Assessment shows need for Vocational Rehabilitation No   Pt is retired            Education: Education Goals: Education classes will be provided on a weekly basis, covering required topics. Participant will state understanding/return demonstration of topics presented.    Education     Row Name 07/22/23 1500     Education   Cardiac Education Topics Pritikin   [employer]  Teacher, English as a foreign language - Sodium-Free   Instruction Review Code 1- Teaching laboratory technician Start Time 1400   Class Stop Time 1440   Class Time Calculation (min) 40 min    Row Name 07/24/23 1600     Education   Cardiac Education Topics Pritikin   [employer]     Workshops   Educator Exercise Physiologist   Select Exercise   Exercise Workshop  Location manager and Fall Prevention   Instruction Review Code 1- Verbalizes Understanding   Class Start Time 1407   Class Stop Time 1447   Class Time Calculation (min) 40 min            Core Videos: Exercise    Move It!  Clinical staff conducted group or individual video education with verbal and written material and guidebook.  Patient learns the recommended Pritikin exercise program. Exercise with the goal of living a long, healthy life. Some of the health benefits of exercise include controlled diabetes, healthier blood pressure levels, improved cholesterol levels, improved heart and lung capacity, improved sleep, and better body composition. Everyone should speak with their doctor before starting or changing an exercise routine.  Biomechanical Limitations Clinical staff conducted group or individual video education with verbal and written material and guidebook.  Patient learns how biomechanical limitations can impact exercise and how we can mitigate and possibly overcome limitations to have an impactful and balanced exercise routine.  Body Composition Clinical staff conducted group or individual video education with verbal and written material and guidebook.  Patient learns that body composition (ratio of muscle mass to fat mass) is a key component to assessing overall fitness, rather than body weight alone. Increased fat mass, especially visceral belly fat, can put us  at increased risk for metabolic syndrome, type 2 diabetes, heart disease, and even death. It is recommended to combine diet and exercise (cardiovascular and resistance training) to improve your body composition. Seek guidance from your physician and exercise physiologist before implementing an exercise routine.  Exercise Action Plan Clinical staff conducted group or individual video education with verbal and written material and guidebook.  Patient learns the recommended strategies to achieve and enjoy long-term  exercise adherence, including variety, self-motivation, self-efficacy, and positive decision making. Benefits of exercise include fitness, good health, weight management, more energy, better sleep, less stress, and overall well-being.  Medical   Heart Disease Risk Reduction Clinical staff conducted group or individual video education with verbal and written material and guidebook.  Patient learns our heart is our most vital organ as it circulates oxygen, nutrients, white blood cells, and hormones throughout the entire body, and carries waste away. Data supports a plant-based eating plan like the Pritikin Program for its effectiveness in slowing progression of and reversing heart disease. The video provides a number of recommendations to address heart disease.   Metabolic Syndrome and Belly Fat  Clinical staff conducted group or individual video education with verbal and written material and guidebook.  Patient learns what metabolic syndrome is, how it leads to heart disease, and how one can reverse it and keep it from coming back. You have metabolic syndrome if you have 3 of the following 5 criteria: abdominal obesity, high blood pressure, high triglycerides, low HDL cholesterol, and high blood sugar.  Hypertension and Heart Disease Clinical staff conducted group or individual video education with verbal and written material and guidebook.  Patient learns that high blood pressure, or hypertension, is very  common in the [place] . Hypertension is largely due to excessive salt intake, but other important risk factors include being overweight, physical inactivity, drinking too much alcohol, smoking, and not eating enough potassium from fruits and vegetables. High blood pressure is a leading risk factor for heart attack, stroke, congestive heart failure, dementia, kidney failure, and premature death. Long-term effects of excessive salt intake include stiffening of the arteries and thickening of heart  muscle and organ damage. Recommendations include ways to reduce hypertension and the risk of heart disease.  Diseases of Our Time - Focusing on Diabetes Clinical staff conducted group or individual video education with verbal and written material and guidebook.  Patient learns why the best way to stop diseases of our time is prevention, through food and other lifestyle changes. Medicine (such as prescription pills and surgeries) is often only a Band-Aid on the problem, not a long-term solution. Most common diseases of our time include obesity, type 2 diabetes, hypertension, heart disease, and cancer. The Pritikin Program is recommended and has been proven to help reduce, reverse, and/or prevent the damaging effects of metabolic syndrome.  Nutrition   Overview of the Pritikin Eating Plan  Clinical staff conducted group or individual video education with verbal and written material and guidebook.  Patient learns about the Pritikin Eating Plan for disease risk reduction. The Pritikin Eating Plan emphasizes a wide variety of unrefined, minimally-processed carbohydrates, like fruits, vegetables, whole grains, and legumes. Go, Caution, and Stop food choices are explained. Plant-based and lean animal proteins are emphasized. Rationale provided for low sodium intake for blood pressure control, low added sugars for blood sugar stabilization, and low added fats and oils for coronary artery disease risk reduction and weight management.  Calorie Density  Clinical staff conducted group or individual video education with verbal and written material and guidebook.  Patient learns about calorie density and how it impacts the Pritikin Eating Plan. Knowing the characteristics of the food you choose will help you decide whether those foods will lead to weight gain or weight loss, and whether you want to consume more or less of them. Weight loss is usually a side effect of the Pritikin Eating Plan because of its focus on  low calorie-dense foods.  Label Reading  Clinical staff conducted group or individual video education with verbal and written material and guidebook.  Patient learns about the Pritikin recommended label reading guidelines and corresponding recommendations regarding calorie density, added sugars, sodium content, and whole grains.  Dining Out - Part 1  Clinical staff conducted group or individual video education with verbal and written material and guidebook.  Patient learns that restaurant meals can be sabotaging because they can be so high in calories, fat, sodium, and/or sugar. Patient learns recommended strategies on how to positively address this and avoid unhealthy pitfalls.  Facts on Fats  Clinical staff conducted group or individual video education with verbal and written material and guidebook.  Patient learns that lifestyle modifications can be just as effective, if not more so, as many medications for lowering your risk of heart disease. A Pritikin lifestyle can help to reduce your risk of inflammation and atherosclerosis (cholesterol build-up, or plaque, in the artery walls). Lifestyle interventions such as dietary choices and physical activity address the cause of atherosclerosis. A review of the types of fats and their impact on blood cholesterol levels, along with dietary recommendations to reduce fat intake is also included.  Nutrition Action Plan  Clinical staff conducted group or individual video education  with verbal and written material and guidebook.  Patient learns how to incorporate Pritikin recommendations into their lifestyle. Recommendations include planning and keeping personal health goals in mind as an important part of their success.  Healthy Mind-Set    Healthy Minds, Bodies, Hearts  Clinical staff conducted group or individual video education with verbal and written material and guidebook.  Patient learns how to identify when they are stressed. Video will discuss  the impact of that stress, as well as the many benefits of stress management. Patient will also be introduced to stress management techniques. The way we think, act, and feel has an impact on our hearts.  How Our Thoughts Can Heal Our Hearts  Clinical staff conducted group or individual video education with verbal and written material and guidebook.  Patient learns that negative thoughts can cause depression and anxiety. This can result in negative lifestyle behavior and serious health problems. Cognitive behavioral therapy is an effective method to help control our thoughts in order to change and improve our emotional outlook.  Additional Videos:  Exercise    Improving Performance  Clinical staff conducted group or individual video education with verbal and written material and guidebook.  Patient learns to use a non-linear approach by alternating intensity levels and lengths of time spent exercising to help burn more calories and lose more body fat. Cardiovascular exercise helps improve heart health, metabolism, hormonal balance, blood sugar control, and recovery from fatigue. Resistance training improves strength, endurance, balance, coordination, reaction time, metabolism, and muscle mass. Flexibility exercise improves circulation, posture, and balance. Seek guidance from your physician and exercise physiologist before implementing an exercise routine and learn your capabilities and proper form for all exercise.  Introduction to Yoga  Clinical staff conducted group or individual video education with verbal and written material and guidebook.  Patient learns about yoga, a discipline of the coming together of mind, breath, and body. The benefits of yoga include improved flexibility, improved range of motion, better posture and core strength, increased lung function, weight loss, and positive self-image. Yoga's heart health benefits include lowered blood pressure, healthier heart rate, decreased  cholesterol and triglyceride levels, improved immune function, and reduced stress. Seek guidance from your physician and exercise physiologist before implementing an exercise routine and learn your capabilities and proper form for all exercise.  Medical   Aging: Enhancing Your Quality of Life  Clinical staff conducted group or individual video education with verbal and written material and guidebook.  Patient learns key strategies and recommendations to stay in good physical health and enhance quality of life, such as prevention strategies, having an advocate, securing a Health Care Proxy and Power of Attorney, and keeping a list of medications and system for tracking them. It also discusses how to avoid risk for bone loss.  Biology of Weight Control  Clinical staff conducted group or individual video education with verbal and written material and guidebook.  Patient learns that weight gain occurs because we consume more calories than we burn (eating more, moving less). Even if your body weight is normal, you may have higher ratios of fat compared to muscle mass. Too much body fat puts you at increased risk for cardiovascular disease, heart attack, stroke, type 2 diabetes, and obesity-related cancers. In addition to exercise, following the Pritikin Eating Plan can help reduce your risk.  Decoding Lab Results  Clinical staff conducted group or individual video education with verbal and written material and guidebook.  Patient learns that lab test reflects one measurement  whose values change over time and are influenced by many factors, including medication, stress, sleep, exercise, food, hydration, pre-existing medical conditions, and more. It is recommended to use the knowledge from this video to become more involved with your lab results and evaluate your numbers to speak with your doctor.   Diseases of Our Time - Overview  Clinical staff conducted group or individual video education with verbal  and written material and guidebook.  Patient learns that according to the CDC, 50% to 70% of chronic diseases (such as obesity, type 2 diabetes, elevated lipids, hypertension, and heart disease) are avoidable through lifestyle improvements including healthier food choices, listening to satiety cues, and increased physical activity.  Sleep Disorders Clinical staff conducted group or individual video education with verbal and written material and guidebook.  Patient learns how good quality and duration of sleep are important to overall health and well-being. Patient also learns about sleep disorders and how they impact health along with recommendations to address them, including discussing with a physician.  Nutrition  Dining Out - Part 2 Clinical staff conducted group or individual video education with verbal and written material and guidebook.  Patient learns how to plan ahead and communicate in order to maximize their dining experience in a healthy and nutritious manner. Included are recommended food choices based on the type of restaurant the patient is visiting.   Fueling a Banker conducted group or individual video education with verbal and written material and guidebook.  There is a strong connection between our food choices and our health. Diseases like obesity and type 2 diabetes are very prevalent and are in large-part due to lifestyle choices. The Pritikin Eating Plan provides plenty of food and hunger-curbing satisfaction. It is easy to follow, affordable, and helps reduce health risks.  Menu Workshop  Clinical staff conducted group or individual video education with verbal and written material and guidebook.  Patient learns that restaurant meals can sabotage health goals because they are often packed with calories, fat, sodium, and sugar. Recommendations include strategies to plan ahead and to communicate with the manager, chef, or server to help order a healthier  meal.  Planning Your Eating Strategy  Clinical staff conducted group or individual video education with verbal and written material and guidebook.  Patient learns about the Pritikin Eating Plan and its benefit of reducing the risk of disease. The Pritikin Eating Plan does not focus on calories. Instead, it emphasizes high-quality, nutrient-rich foods. By knowing the characteristics of the foods, we choose, we can determine their calorie density and make informed decisions.  Targeting Your Nutrition Priorities  Clinical staff conducted group or individual video education with verbal and written material and guidebook.  Patient learns that lifestyle habits have a tremendous impact on disease risk and progression. This video provides eating and physical activity recommendations based on your personal health goals, such as reducing LDL cholesterol, losing weight, preventing or controlling type 2 diabetes, and reducing high blood pressure.  Vitamins and Minerals  Clinical staff conducted group or individual video education with verbal and written material and guidebook.  Patient learns different ways to obtain key vitamins and minerals, including through a recommended healthy diet. It is important to discuss all supplements you take with your doctor.   Healthy Mind-Set    Smoking Cessation  Clinical staff conducted group or individual video education with verbal and written material and guidebook.  Patient learns that cigarette smoking and tobacco addiction pose a serious health risk  which affects millions of people. Stopping smoking will significantly reduce the risk of heart disease, lung disease, and many forms of cancer. Recommended strategies for quitting are covered, including working with your doctor to develop a successful plan.  Culinary   Becoming a Set designer conducted group or individual video education with verbal and written material and guidebook.  Patient learns  that cooking at home can be healthy, cost-effective, quick, and puts them in control. Keys to cooking healthy recipes will include looking at your recipe, assessing your equipment needs, planning ahead, making it simple, choosing cost-effective seasonal ingredients, and limiting the use of added fats, salts, and sugars.  Cooking - Breakfast and Snacks  Clinical staff conducted group or individual video education with verbal and written material and guidebook.  Patient learns how important breakfast is to satiety and nutrition through the entire day. Recommendations include key foods to eat during breakfast to help stabilize blood sugar levels and to prevent overeating at meals later in the day. Planning ahead is also a key component.  Cooking - Educational psychologist conducted group or individual video education with verbal and written material and guidebook.  Patient learns eating strategies to improve overall health, including an approach to cook more at home. Recommendations include thinking of animal protein as a side on your plate rather than center stage and focusing instead on lower calorie dense options like vegetables, fruits, whole grains, and plant-based proteins, such as beans. Making sauces in large quantities to freeze for later and leaving the skin on your vegetables are also recommended to maximize your experience.  Cooking - Healthy Salads and Dressing Clinical staff conducted group or individual video education with verbal and written material and guidebook.  Patient learns that vegetables, fruits, whole grains, and legumes are the foundations of the Pritikin Eating Plan. Recommendations include how to incorporate each of these in flavorful and healthy salads, and how to create homemade salad dressings. Proper handling of ingredients is also covered. Cooking - Soups and [employer] - Soups and Desserts Clinical staff conducted group or individual video education with  verbal and written material and guidebook.  Patient learns that Pritikin soups and desserts make for easy, nutritious, and delicious snacks and meal components that are low in sodium, fat, sugar, and calorie density, while high in vitamins, minerals, and filling fiber. Recommendations include simple and healthy ideas for soups and desserts.   Overview     The Pritikin Solution Program Overview Clinical staff conducted group or individual video education with verbal and written material and guidebook.  Patient learns that the results of the Pritikin Program have been documented in more than 100 articles published in peer-reviewed journals, and the benefits include reducing risk factors for (and, in some cases, even reversing) high cholesterol, high blood pressure, type 2 diabetes, obesity, and more! An overview of the three key pillars of the Pritikin Program will be covered: eating well, doing regular exercise, and having a healthy mind-set.  WORKSHOPS  Exercise: Exercise Basics: Building Your Action Plan Clinical staff led group instruction and group discussion with PowerPoint presentation and patient guidebook. To enhance the learning environment the use of posters, models and videos may be added. At the conclusion of this workshop, patients will comprehend the difference between physical activity and exercise, as well as the benefits of incorporating both, into their routine. Patients will understand the FITT (Frequency, Intensity, Time, and Type) principle and how to use it to  build an exercise action plan. In addition, safety concerns and other considerations for exercise and cardiac rehab will be addressed by the presenter. The purpose of this lesson is to promote a comprehensive and effective weekly exercise routine in order to improve patients' overall level of fitness.   Managing Heart Disease: Your Path to a Healthier Heart Clinical staff led group instruction and group discussion with  PowerPoint presentation and patient guidebook. To enhance the learning environment the use of posters, models and videos may be added.At the conclusion of this workshop, patients will understand the anatomy and physiology of the heart. Additionally, they will understand how Pritikin's three pillars impact the risk factors, the progression, and the management of heart disease.  The purpose of this lesson is to provide a high-level overview of the heart, heart disease, and how the Pritikin lifestyle positively impacts risk factors.  Exercise Biomechanics Clinical staff led group instruction and group discussion with PowerPoint presentation and patient guidebook. To enhance the learning environment the use of posters, models and videos may be added. Patients will learn how the structural parts of their bodies function and how these functions impact their daily activities, movement, and exercise. Patients will learn how to promote a neutral spine, learn how to manage pain, and identify ways to improve their physical movement in order to promote healthy living. The purpose of this lesson is to expose patients to common physical limitations that impact physical activity. Participants will learn practical ways to adapt and manage aches and pains, and to minimize their effect on regular exercise. Patients will learn how to maintain good posture while sitting, walking, and lifting.  Balance Training and Fall Prevention  Clinical staff led group instruction and group discussion with PowerPoint presentation and patient guidebook. To enhance the learning environment the use of posters, models and videos may be added. At the conclusion of this workshop, patients will understand the importance of their sensorimotor skills (vision, proprioception, and the vestibular system) in maintaining their ability to balance as they age. Patients will apply a variety of balancing exercises that are appropriate for their  current level of function. Patients will understand the common causes for poor balance, possible solutions to these problems, and ways to modify their physical environment in order to minimize their fall risk. The purpose of this lesson is to teach patients about the importance of maintaining balance as they age and ways to minimize their risk of falling.  WORKSHOPS   Nutrition:  Fueling a Ship broker led group instruction and group discussion with PowerPoint presentation and patient guidebook. To enhance the learning environment the use of posters, models and videos may be added. Patients will review the foundational principles of the Pritikin Eating Plan and understand what constitutes a serving size in each of the food groups. Patients will also learn Pritikin-friendly foods that are better choices when away from home and review make-ahead meal and snack options. Calorie density will be reviewed and applied to three nutrition priorities: weight maintenance, weight loss, and weight gain. The purpose of this lesson is to reinforce (in a group setting) the key concepts around what patients are recommended to eat and how to apply these guidelines when away from home by planning and selecting Pritikin-friendly options. Patients will understand how calorie density may be adjusted for different weight management goals.  Mindful Eating  Clinical staff led group instruction and group discussion with PowerPoint presentation and patient guidebook. To enhance the learning environment the use of posters,  models and videos may be added. Patients will briefly review the concepts of the Pritikin Eating Plan and the importance of low-calorie dense foods. The concept of mindful eating will be introduced as well as the importance of paying attention to internal hunger signals. Triggers for non-hunger eating and techniques for dealing with triggers will be explored. The purpose of this lesson is to provide  patients with the opportunity to review the basic principles of the Pritikin Eating Plan, discuss the value of eating mindfully and how to measure internal cues of hunger and fullness using the Hunger Scale. Patients will also discuss reasons for non-hunger eating and learn strategies to use for controlling emotional eating.  Targeting Your Nutrition Priorities Clinical staff led group instruction and group discussion with PowerPoint presentation and patient guidebook. To enhance the learning environment the use of posters, models and videos may be added. Patients will learn how to determine their genetic susceptibility to disease by reviewing their family history. Patients will gain insight into the importance of diet as part of an overall healthy lifestyle in mitigating the impact of genetics and other environmental insults. The purpose of this lesson is to provide patients with the opportunity to assess their personal nutrition priorities by looking at their family history, their own health history and current risk factors. Patients will also be able to discuss ways of prioritizing and modifying the Pritikin Eating Plan for their highest risk areas  Menu  Clinical staff led group instruction and group discussion with PowerPoint presentation and patient guidebook. To enhance the learning environment the use of posters, models and videos may be added. Using menus brought in from [employer], or printed from [employer], patients will apply the Pritikin dining out guidelines that were presented in the [employer] video. Patients will also be able to practice these guidelines in a variety of provided scenarios. The purpose of this lesson is to provide patients with the opportunity to practice hands-on learning of the Pritikin Dining Out guidelines with actual menus and practice scenarios.  Label Reading Clinical staff led group instruction and group discussion with PowerPoint  presentation and patient guidebook. To enhance the learning environment the use of posters, models and videos may be added. Patients will review and discuss the Pritikin label reading guidelines presented in Pritikin's Label Reading Educational series video. Using fool labels brought in from local grocery stores and markets, patients will apply the label reading guidelines and determine if the packaged food meet the Pritikin guidelines. The purpose of this lesson is to provide patients with the opportunity to review, discuss, and practice hands-on learning of the Pritikin Label Reading guidelines with actual packaged food labels. Cooking School  Pritikin's [employer] are designed to teach patients ways to prepare quick, simple, and affordable recipes at home. The importance of nutrition's role in chronic disease risk reduction is reflected in its emphasis in the overall Pritikin program. By learning how to prepare essential core Pritikin Eating Plan recipes, patients will increase control over what they eat; be able to customize the flavor of foods without the use of added salt, sugar, or fat; and improve the quality of the food they consume. By learning a set of core recipes which are easily assembled, quickly prepared, and affordable, patients are more likely to prepare more healthy foods at home. These workshops focus on convenient breakfasts, simple entres, side dishes, and desserts which can be prepared with minimal effort and are consistent with nutrition recommendations for cardiovascular  risk reduction. Cooking [employer] are taught by a Armed forces logistics/support/administrative officer (RD) who has been trained by the [employer]. The chef or RD has a clear understanding of the importance of minimizing - if not completely eliminating - added fat, sugar, and sodium in recipes. Throughout the series of Cooking School Workshop sessions, patients will learn about healthy ingredients and  efficient methods of cooking to build confidence in their capability to prepare    Cooking School weekly topics:  Adding Flavor- Sodium-Free  Fast and Healthy Breakfasts  Powerhouse Plant-Based Proteins  Satisfying Salads and Dressings  Simple Sides and Sauces  International Cuisine-Spotlight on the [employer] Zones  Delicious Desserts  Savory Soups  [employer] - Meals in a Astronomer Appetizers and Snacks  Comforting Weekend Breakfasts  One-Pot Wonders   Fast Evening Meals  Landscape architect Your Prit[facility]in Plate  WORKSHOPS   Healthy Mindset (Psychosocial):  Focused Goals, Sustainable Changes Clinical staff led group instruction and group discussion with PowerPoint presentation and patient guidebook. To enhance the learning environment the use of posters, models and videos may be added. Patients will be able to apply effective goal setting strategies to establish at least one personal goal, and then take consistent, meaningful action toward that goal. They will learn to identify common barriers to achieving personal goals and develop strategies to overcome them. Patients will also gain an understanding of how our mind-set can impact our ability to achieve goals and the importance of cultivating a positive and growth-oriented mind-set. The purpose of this lesson is to provide patients with a deeper understanding of how to set and achieve personal goals, as well as the tools and strategies needed to overcome common obstacles which may arise along the way.  From Head to Heart: The Power of a Healthy Outlook  Clinical staff led group instruction and group discussion with PowerPoint presentation and patient guidebook. To enhance the learning environment the use of posters, models and videos may be added. Patients will be able to recognize and describe the impact of emotions and mood on physical health. They will discover the importance of self-care and explore self-care  practices which may work for them. Patients will also learn how to utilize the 4 C's to cultivate a healthier outlook and better manage stress and challenges. The purpose of this lesson is to demonstrate to patients how a healthy outlook is an essential part of maintaining good health, especially as they continue their cardiac rehab journey.  Healthy Sleep for a Healthy Heart Clinical staff led group instruction and group discussion with PowerPoint presentation and patient guidebook. To enhance the learning environment the use of posters, models and videos may be added. At the conclusion of this workshop, patients will be able to demonstrate knowledge of the importance of sleep to overall health, well-being, and quality of life. They will understand the symptoms of, and treatments for, common sleep disorders. Patients will also be able to identify daytime and nighttime behaviors which impact sleep, and they will be able to apply these tools to help manage sleep-related challenges. The purpose of this lesson is to provide patients with a general overview of sleep and outline the importance of quality sleep. Patients will learn about a few of the most common sleep disorders. Patients will also be introduced to the concept of "sleep hygiene," and discover ways to self-manage certain sleeping problems through simple daily behavior changes. Finally, the workshop will motivate patients by clarifying the links  between quality sleep and their goals of heart-healthy living.   Recognizing and Reducing Stress Clinical staff led group instruction and group discussion with PowerPoint presentation and patient guidebook. To enhance the learning environment the use of posters, models and videos may be added. At the conclusion of this workshop, patients will be able to understand the types of stress reactions, differentiate between acute and chronic stress, and recognize the impact that chronic stress has on their health. They  will also be able to apply different coping mechanisms, such as reframing negative self-talk. Patients will have the opportunity to practice a variety of stress management techniques, such as deep abdominal breathing, progressive muscle relaxation, and/or guided imagery.  The purpose of this lesson is to educate patients on the role of stress in their lives and to provide healthy techniques for coping with it.  Learning Barriers/Preferences:  Learning Barriers/Preferences - 07/16/23 1047       Learning Barriers/Preferences   Learning Barriers Hearing;Sight   Wears readinhg glasses, bilateral hearing aids   Learning Preferences Computer/Internet;Video;Written Material;Pictoral             Education Topics:  Knowledge Questionnaire Score:  Knowledge Questionnaire Score - 07/16/23 1208       Knowledge Questionnaire Score   Pre Score 20/24             Core Components/Risk Factors/Patient Goals at Admission:  Personal Goals and Risk Factors at Admission - 07/16/23 1049       Core Components/Risk Factors/Patient Goals on Admission    Weight Management Yes;Obesity;Weight Loss    Intervention Weight Management: Develop a combined nutrition and exercise program designed to reach desired caloric intake, while maintaining appropriate intake of nutrient and fiber, sodium and fats, and appropriate energy expenditure required for the weight goal.;Weight Management: Provide education and appropriate resources to help participant work on and attain dietary goals.;Weight Management/Obesity: Establish reasonable short term and long term weight goals.;Obesity: Provide education and appropriate resources to help participant work on and attain dietary goals.    Admit Weight 228 lb 9.9 oz (103.7 kg)    Goal Weight: Short Term 216 lb (98 kg)    Expected Outcomes Short Term: Continue to assess and modify interventions until short term weight is achieved;Long Term: Adherence to nutrition and physical  activity/exercise program aimed toward attainment of established weight goal;Weight Loss: Understanding of general recommendations for a balanced deficit meal plan, which promotes 1-2 lb weight loss per week and includes a negative energy balance of [phone] kcal/d;Understanding recommendations for meals to include 15-35% energy as protein, 25-35% energy from fat, 35-60% energy from carbohydrates, less than 200mg  of dietary cholesterol, 20-35 gm of total fiber daily;Understanding of distribution of calorie intake throughout the day with the consumption of 4-5 meals/snacks    Hypertension Yes    Intervention Provide education on lifestyle modifcations including regular physical activity/exercise, weight management, moderate sodium restriction and increased consumption of fresh fruit, vegetables, and low fat dairy, alcohol moderation, and smoking cessation.;Monitor prescription use compliance.    Expected Outcomes Short Term: Continued assessment and intervention until BP is < 140/6mm HG in hypertensive participants. < 130/18mm HG in hypertensive participants with diabetes, heart failure or chronic kidney disease.;Long Term: Maintenance of blood pressure at goal levels.    Lipids Yes    Intervention Provide education and support for participant on nutrition & aerobic/resistive exercise along with prescribed medications to achieve LDL 70mg , HDL >40mg .    Expected Outcomes Short Term: Participant states understanding of desired  cholesterol values and is compliant with medications prescribed. Participant is following exercise prescription and nutrition guidelines.;Long Term: Cholesterol controlled with medications as prescribed, with individualized exercise RX and with personalized nutrition plan. Value goals: LDL < 70mg , HDL > 40 mg.             Core Components/Risk Factors/Patient Goals Review:   Goals and Risk Factor Review     Row Name 07/23/23 1002 08/04/23 1614           Core Components/Risk  Factors/Patient Goals Review   Personal Goals Review Weight Management/Obesity;Hypertension;Lipids Weight Management/Obesity;Hypertension;Lipids      Review Royale started cardiac rehab on 07/22/23. Delfina did well with exercise. Vital signs were stable Iliana started cardiac rehab on 07/22/23. Exercise is currently on hold due to migraine. Koya has an appointment with her neurologist on 08/19/23 await clearance to return to exercise.      Expected Outcomes Shaunda will continnue to participate in cardiac rehab for exercise, nutrition and lifestyle modifications Jalon will continnue to participate in cardiac rehab for exercise, nutrition and lifestyle modifications               Core Components/Risk Factors/Patient Goals at Discharge (Final Review):   Goals and Risk Factor Review - 08/04/23 1614       Core Components/Risk Factors/Patient Goals Review   Personal Goals Review Weight Management/Obesity;Hypertension;Lipids    Review Babs started cardiac rehab on 07/22/23. Exercise is currently on hold due to migraine. Salam has an appointment with her neurologist on 08/19/23 await clearance to return to exercise.    Expected Outcomes Tarra will continnue to participate in cardiac rehab for exercise, nutrition and lifestyle modifications             ITP Comments:  ITP Comments     Row Name 07/16/23 1033 07/23/23 0958 08/04/23 1612       ITP Comments Gaylyn Keas, MD:  Introduction to the [facility] Education Program / Intensive Cardiac Rehab.  Initial orientation packet reviewed with the patient. 30 Day ITP Review. Chrysten started cardiac rehab on 07/22/23. Mazella did well with exercise. 30 Day ITP Review. Deysy's exercise at cardiology is currently on hold due to ED visit with migraine await neurology clearance for the patient to resume exercise.              Comments: See ITP comments.Monte Antonio RN BSN

## 2023-08-05 ENCOUNTER — Encounter (HOSPITAL_COMMUNITY)

## 2023-08-07 ENCOUNTER — Encounter (HOSPITAL_COMMUNITY): Admission: RE | Admit: 2023-08-07 | Source: Ambulatory Visit

## 2023-08-12 ENCOUNTER — Telehealth (HOSPITAL_COMMUNITY): Payer: Self-pay | Admitting: *Deleted

## 2023-08-12 ENCOUNTER — Encounter (HOSPITAL_COMMUNITY): Admission: RE | Admit: 2023-08-12 | Source: Ambulatory Visit

## 2023-08-12 NOTE — Telephone Encounter (Signed)
 Spoke with Cassandra [name] she had to cancel her appointment with the neurologist as she had a migraine on that day. [name]'s new appointment is on 08/19/23. Will cancel exercise appointments on the beginning of June. Await clearance to return to exercise.Monte Antonio RN BSN

## 2023-08-14 ENCOUNTER — Encounter (HOSPITAL_COMMUNITY)

## 2023-08-17 ENCOUNTER — Ambulatory Visit: Payer: 59 | Admitting: Dermatology

## 2023-08-17 ENCOUNTER — Encounter (HOSPITAL_COMMUNITY)

## 2023-08-17 DIAGNOSIS — L820 Inflamed seborrheic keratosis: Secondary | ICD-10-CM

## 2023-08-17 DIAGNOSIS — L219 Seborrheic dermatitis, unspecified: Secondary | ICD-10-CM

## 2023-08-17 DIAGNOSIS — L720 Epidermal cyst: Secondary | ICD-10-CM | POA: Diagnosis not present

## 2023-08-17 DIAGNOSIS — L739 Follicular disorder, unspecified: Secondary | ICD-10-CM

## 2023-08-17 MED ORDER — CLINDAMYCIN PHOSPHATE 1 % EX SOLN: CUTANEOUS | 11 refills | Status: DC

## 2023-08-17 MED ORDER — CLOBETASOL PROPIONATE 0.05 % EX SOLN: 1.0000 | Freq: Two times a day (BID) | CUTANEOUS | 3 refills | Status: AC

## 2023-08-17 NOTE — Patient Instructions (Addendum)
 For pimples or bumps at scalp  Use clindamycin  solution 1 % apply topically to areas once to twice daily   For itchy areas at scalp  Use clobetasol  0.05 % solution - apply topically 1 to 2 drops to affected areas once to twice daily to itchy areas as needed  Avoid applying to face, groin, and axilla. Use as directed. Long-term use can cause thinning of the skin.  Topical steroids (such as triamcinolone, fluocinolone, fluocinonide, mometasone, clobetasol , halobetasol, betamethasone, hydrocortisone ) can cause thinning and lightening of the skin if they are used for too long in the same area. Your physician has selected the right strength medicine for your problem and area affected on the body. Please use your medication only as directed by your physician to prevent side effects.   For Milia or white bumps at face  Can get over the counter adapalene / Differin 0.1 % cream use a pea sized amount to bumps nightly   Topical retinoid medications like tretinoin/Retin-A, adapalene/Differin, tazarotene/Fabior, and Epiduo/Epiduo Forte can cause dryness and irritation when first started. Only apply a pea-sized amount to the entire affected area. Avoid applying it around the eyes, edges of mouth and creases at the nose. If you experience irritation, use a good moisturizer first and/or apply the medicine less often. If you are doing well with the medicine, you can increase how often you use it until you are applying every night. Be careful with sun protection while using this medication as it can make you sensitive to the sun. This medicine should not be used by pregnant women.     Due to recent changes in healthcare laws, you may see results of your pathology and/or laboratory studies on MyChart before the doctors have had a chance to review them. We understand that in some cases there may be results that are confusing or concerning to you. Please understand that not all results are received at the same time  and often the doctors may need to interpret multiple results in order to provide you with the best plan of care or course of treatment. Therefore, we ask that you please give us  2 business days to thoroughly review all your results before contacting the office for clarification. Should we see a critical lab result, you will be contacted sooner.   If You Need Anything After Your Visit  If you have any questions or concerns for your doctor, please call our main line at [phone] and press option 4 to reach your doctor's medical assistant. If no one answers, please leave a voicemail as directed and we will return your call as soon as possible. Messages left after 4 pm will be answered the following business day.   You may also send us  a message via MyChart. We typically respond to MyChart messages within 1-2 business days.  For prescription refills, please ask your pharmacy to contact our office. Our fax number is [phone].  If you have an urgent issue when the clinic is closed that cannot wait until the next business day, you can page your doctor at the number below.    Please note that while we do our best to be available for urgent issues outside of office hours, we are not available 24/7.   If you have an urgent issue and are unable to reach us , you may choose to seek medical care at your doctor's office, retail clinic, urgent care center, or emergency room.  If you have a medical emergency, please immediately call 911  or go to the emergency department.  Pager Numbers  - Dr. Bary Likes: [phone]  - Dr. Annette Barters: [phone]  - Dr. Felipe Horton: [phone]   In the event of inclement weather, please call our main line at [phone] for an update on the status of any delays or closures.  Dermatology Medication Tips: Please keep the boxes that topical medications come in in order to help keep track of the instructions about where and how to use these. Pharmacies typically print the  medication instructions only on the boxes and not directly on the medication tubes.   If your medication is too expensive, please contact our office at [phone] option 4 or send us  a message through MyChart.   We are unable to tell what your co-pay for medications will be in advance as this is different depending on your insurance coverage. However, we may be able to find a substitute medication at lower cost or fill out paperwork to get insurance to cover a needed medication.   If a prior authorization is required to get your medication covered by your insurance company, please allow us  1-2 business days to complete this process.  Drug prices often vary depending on where the prescription is filled and some pharmacies may offer cheaper prices.  The website www.goodrx.com contains coupons for medications through different pharmacies. The prices here do not account for what the cost may be with help from insurance (it may be cheaper with your insurance), but the website can give you the price if you did not use any insurance.  - You can print the associated coupon and take it with your prescription to the pharmacy.  - You may also stop by our office during regular business hours and pick up a GoodRx coupon card.  - If you need your prescription sent electronically to a different pharmacy, notify our office through [facility] or by phone at [phone] option 4.     Si Usted Necesita Algo Despus de Su Visita  Tambin puede enviarnos un mensaje a travs de Clinical cytogeneticist. Por lo general respondemos a los mensajes de MyChart en el transcurso de 1 a 2 das hbiles.  Para renovar recetas, por favor pida a su farmacia que se ponga en contacto con nuestra oficina. Franz Jacks de fax es [place] [phone].  Si tiene un asunto urgente cuando la clnica est cerrada y que no puede esperar hasta el siguiente da hbil, puede llamar/localizar a su doctor(a) al nmero que aparece a continuacin.    Por favor, tenga en cuenta que aunque hacemos todo lo posible para estar disponibles para asuntos urgentes fuera del horario de [place], no estamos disponibles las 24 horas del da, los 7 [address]  [address] de la [place].   Si tiene un problema urgente y no puede comunicarse con nosotros, puede optar por buscar atencin mdica  en el consultorio de su doctor(a), en una clnica privada, en un centro de atencin urgente o en una sala de emergencias.  Si tiene Engineer, drilling, por favor llame inmediatamente al 911 o vaya a la sala de emergencias.  Nmeros de bper  - Dr. Bary Likes: [phone]  - Dra. Annette Barters: [phone]  - Dr. Felipe Horton: [phone]   En caso de inclemencias del tiempo, por favor llame a Lajuan Pila principal al [phone] para una actualizacin sobre el [place] de cualquier retraso o cierre.  Consejos para la medicacin en dermatologa: Por favor, guarde las cajas en las que vienen los medicamentos de uso tpico para ayudarle a seguir las instrucciones  sobre dnde y cmo usarlos. Las farmacias generalmente imprimen las instrucciones del medicamento slo en las cajas y no directamente en los tubos del [place].   Si su medicamento es muy caro, por favor, pngase en contacto con Bettyjane Brunet llamando al [phone] y presione la opcin 4 o envenos un mensaje a travs de Clinical cytogeneticist.   No podemos decirle cul ser su copago por los medicamentos por adelantado ya que esto es diferente dependiendo de la cobertura de su seguro. Sin embargo, es posible que podamos encontrar un medicamento sustituto a Audiological scientist un formulario para que el seguro cubra el medicamento que se considera necesario.   Si se requiere una autorizacin previa para que su compaa de seguros [place] su medicamento, por favor permtanos de 1 a 2 das hbiles para completar este proceso.  Los precios de los medicamentos varan con frecuencia dependiendo del Environmental consultant de dnde se surte la receta y alguna  farmacias pueden ofrecer precios ms baratos.  El sitio web www.goodrx.com tiene cupones para medicamentos de Health and safety inspector. Los precios aqu no tienen en cuenta lo que podra costar con la ayuda del seguro (puede ser ms barato con su seguro), pero el sitio web puede darle el precio si no utiliz Tourist information centre manager.  - Puede imprimir el cupn correspondiente y llevarlo con su receta a la farmacia.  - Tambin puede pasar por nuestra oficina durante el horario de atencin regular y Education officer, museum una tarjeta de cupones de GoodRx.  - Si necesita que su receta se enve electrnicamente a una farmacia diferente, informe a nuestra oficina a travs de MyChart de [place] o por telfono llamando al [phone] y presione la opcin 4.

## 2023-08-17 NOTE — Progress Notes (Signed)
   Follow-Up Visit   Subjective  Cassandra [name] is a 68 y.o. female who presents for the following: still has some itch but has improved with breakouts since using ketoconazole  shampoo 3 times a week and clindamycin  solution as needed for flares  White bumps at face under eye   The patient has spots, moles and lesions to be evaluated, some may be new or changing and the patient may have concern these could be cancer.   The following portions of the chart were reviewed this encounter and updated as appropriate: medications, allergies, medical history  Review of Systems:  No other skin or systemic complaints except as noted in HPI or Assessment and Plan.  Objective  Well appearing patient in no apparent distress; mood and affect are within normal limits.   A focused examination was performed of the following areas: Face, scalp  Relevant exam findings are noted in the Assessment and Plan.    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 Condition is symptomatic / bothersome to patient. Not to goal.   Seborrheic Dermatitis is a chronic persistent rash characterized by pinkness and scaling most commonly of the mid face but also can occur on the scalp (dandruff), ears; mid chest, mid back and groin.  It tends to be exacerbated by stress and cooler weather.  People who have neurologic disease may experience new onset or exacerbation of existing seborrheic dermatitis.  The condition is not curable but treatable and can be controlled.  Folliculitis occurs due to  inflammation of the superficial hair follicle (pore), resulting in acne-like lesions (pus bumps). It can be infectious (bacterial, fungal) or noninfectious (shaving, tight clothing, heat/sweat, medications).  Folliculitis can be acute or chronic and recommended treatment depends on the underlying cause of folliculitis.   Treatment Plan: Cont Ketoconazole  2% shampoo 3x/wk let sit 5 minutes and rinse out May cont Clindamycin  sol every day prn flares     MILIA Exam: tiny erythematous firm white papule R infraocular  Discussed this is a type of cyst. Benign-appearing. Sometimes these will clear with OTC adapalene/Differin 0.1% cream QHS or retinol.  Discussed extraction if symptomatic.  Effaclar sample given.   SEBORRHEIC DERMATITIS   Related Medications clobetasol  (TEMOVATE ) 0.05 % external solution Apply 1 Application topically 2 (two) times daily. Apply to itchy areas of scalp FOLLICULITIS   Related Medications clindamycin  (CLEOCIN  T) 1 % external solution Apply topically to bumps at back of neck once to twice daily as needed  Return in about 1 year (around 08/16/2024) for seb derm and folliculitis follow up.  I, Randee Busing, CMA, am acting as scribe for Artemio Larry, MD.   Documentation: I have reviewed the above documentation for accuracy and completeness, and I agree with the above.  Artemio Larry, MD

## 2023-08-19 ENCOUNTER — Encounter (HOSPITAL_COMMUNITY)

## 2023-08-20 ENCOUNTER — Telehealth (HOSPITAL_COMMUNITY): Payer: Self-pay | Admitting: *Deleted

## 2023-08-20 NOTE — Telephone Encounter (Signed)
 Spoke with Elouise she says she has been cleared to return to exercise at cardiac rehab. Will call Dempsey Finely NP office at the [facility]/Neurology to get the okay to resume exercise at cardiac rehab. Patient would like to resume exercise on Monday.Monte Antonio RN BSN

## 2023-08-21 ENCOUNTER — Encounter (HOSPITAL_COMMUNITY)

## 2023-08-24 ENCOUNTER — Encounter (HOSPITAL_COMMUNITY): Admission: RE | Admit: 2023-08-24 | Source: Ambulatory Visit

## 2023-08-25 ENCOUNTER — Telehealth (HOSPITAL_COMMUNITY): Payer: Self-pay | Admitting: *Deleted

## 2023-08-25 NOTE — Telephone Encounter (Signed)
 Spoke with Natalya she has been cleared per Fonnie Iba NP/ Neurology to resume cardiac rehab from a neurologic standpoint. Bricia plans to return to exercise on 08/26/23.Monte Antonio RN BSN

## 2023-08-26 ENCOUNTER — Encounter (HOSPITAL_COMMUNITY)
Admission: RE | Admit: 2023-08-26 | Discharge: 2023-08-26 | Disposition: A | Discharge status: Home / Self Care | Source: Ambulatory Visit | Attending: Cardiovascular Disease | Admitting: Cardiovascular Disease

## 2023-08-26 DIAGNOSIS — Z48812 Encounter for surgical aftercare following surgery on the circulatory system: Secondary | ICD-10-CM | POA: Diagnosis present

## 2023-08-26 DIAGNOSIS — Z952 Presence of prosthetic heart valve: Secondary | ICD-10-CM | POA: Diagnosis not present

## 2023-08-28 ENCOUNTER — Encounter (HOSPITAL_COMMUNITY)

## 2023-08-28 ENCOUNTER — Encounter (HOSPITAL_COMMUNITY)
Admission: RE | Admit: 2023-08-28 | Discharge: 2023-08-28 | Disposition: A | Discharge status: Home / Self Care | Source: Ambulatory Visit | Attending: Cardiovascular Disease | Admitting: Cardiovascular Disease

## 2023-08-28 DIAGNOSIS — Z48812 Encounter for surgical aftercare following surgery on the circulatory system: Secondary | ICD-10-CM | POA: Diagnosis not present

## 2023-08-28 DIAGNOSIS — Z952 Presence of prosthetic heart valve: Secondary | ICD-10-CM

## 2023-08-31 ENCOUNTER — Encounter (HOSPITAL_COMMUNITY)
Admission: RE | Admit: 2023-08-31 | Discharge: 2023-08-31 | Disposition: A | Discharge status: Home / Self Care | Source: Ambulatory Visit | Attending: Cardiovascular Disease | Admitting: Cardiovascular Disease

## 2023-08-31 ENCOUNTER — Encounter (HOSPITAL_COMMUNITY)

## 2023-08-31 DIAGNOSIS — Z48812 Encounter for surgical aftercare following surgery on the circulatory system: Secondary | ICD-10-CM | POA: Diagnosis not present

## 2023-08-31 DIAGNOSIS — Z952 Presence of prosthetic heart valve: Secondary | ICD-10-CM

## 2023-09-02 ENCOUNTER — Encounter (HOSPITAL_COMMUNITY)

## 2023-09-02 ENCOUNTER — Encounter (HOSPITAL_COMMUNITY)
Admission: RE | Admit: 2023-09-02 | Discharge: 2023-09-02 | Disposition: A | Discharge status: Home / Self Care | Source: Ambulatory Visit | Attending: Cardiovascular Disease | Admitting: Cardiovascular Disease

## 2023-09-02 DIAGNOSIS — Z48812 Encounter for surgical aftercare following surgery on the circulatory system: Secondary | ICD-10-CM | POA: Diagnosis not present

## 2023-09-02 NOTE — Progress Notes (Signed)
 Cardiac Individual Treatment Plan  Patient Details  Name: Cassandra [name] MRN: [id number] Date of Birth: [birth year] Referring Provider:   Flowsheet Row INTENSIVE CARDIAC REHAB ORIENT from 07/16/2023 in [facility]  Referring Provider Freddy Jain, MD    Initial Encounter Date:  Flowsheet Row INTENSIVE CARDIAC REHAB ORIENT from 07/16/2023 in [facility]  Date 07/16/23    Visit Diagnosis: 06/02/23 S/P TAVR (transcatheter aortic valve replacement)  Patient's Home Medications on Admission:  Current Outpatient Medications:    acetaminophen  (TYLENOL ) 500 MG tablet, Take 1,000 mg by mouth every 6 (six) hours as needed for moderate pain (pain score 4-6) or mild pain (pain score 1-3)., Disp: , Rfl:    albuterol  (VENTOLIN  HFA) 108 (90 Base) MCG/ACT inhaler, Inhale 2 puffs into the lungs every 6 (six) hours as needed (Congestion)., Disp: , Rfl:    alendronate (FOSAMAX) 70 MG tablet, Take 70 mg by mouth every Sunday. Take with a full glass of water on an empty stomach., Disp: , Rfl:    aspirin  EC 81 MG tablet, Take 1 tablet (81 mg total) by mouth daily. Swallow whole., Disp: , Rfl:    atorvastatin  (LIPITOR) 20 MG tablet, Take 20 mg by mouth at bedtime., Disp: , Rfl:    cetirizine (ZYRTEC) 10 MG tablet, Take 10 mg by mouth in the morning., Disp: , Rfl:    Cholecalciferol  25 MCG (1000 UT) tablet, Take 1,000 Units by mouth daily., Disp: , Rfl:    clindamycin  (CLEOCIN  T) 1 % external solution, Apply topically to bumps at back of neck once to twice daily as needed, Disp: 60 mL, Rfl: 11   clobetasol  (TEMOVATE ) 0.05 % external solution, Apply 1 Application topically 2 (two) times daily. Apply to itchy areas of scalp, Disp: 50 mL, Rfl: 3   doxycycline  (VIBRA -TABS) 100 MG tablet, Take 1 tablet by mouth ONE HOUR BEFORE ANY DENTAL PROCEDURES, INCLUDING CLEANINGS, Disp: 3 tablet, Rfl: 1    escitalopram  (LEXAPRO ) 20 MG tablet, Take 20 mg by mouth in the morning., Disp: , Rfl:    ipratropium (ATROVENT) 0.06 % nasal spray, Place 1 spray into both nostrils at bedtime., Disp: , Rfl:    ketoconazole  (NIZORAL ) 2 % shampoo, Apply 1 Application topically 3 (three) times a week. Wash scalp 3 time weekly, let sit 5 minutes and rinse out (Patient taking differently: Apply 1 Application topically every other day.), Disp: 120 mL, Rfl: 11   montelukast  (SINGULAIR ) 10 MG tablet, Take 10 mg by mouth at bedtime., Disp: , Rfl:    Multiple Vitamin (MULTI-VITAMIN) tablet, Take 1 tablet by mouth daily., Disp: , Rfl:    pantoprazole  (PROTONIX ) 40 MG tablet, Take 40 mg by mouth daily., Disp: , Rfl:    QULIPTA 60 MG TABS, Take 1 tablet by mouth daily., Disp: , Rfl:    Rimegepant Sulfate (NURTEC) 75 MG TBDP, Take 75 mg by mouth daily as needed (for migraines)., Disp: , Rfl:    telmisartan (MICARDIS) 20 MG tablet, Take 20 mg by mouth in the morning., Disp: , Rfl:   Past Medical History: Past Medical History:  Diagnosis Date   Allergy    Anxiety    Complication of anesthesia    difficulty waking up   Depression    GERD (gastroesophageal reflux disease)    Hypertension    Leukocytoclastic vasculitis (HCC)    Dr. Bertrum Brodie and Dr. 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 Abel Hoe & Dr. Honey Lusty   Severe aortic stenosis    Sleep apnea    cpap   SVT (supraventricular tachycardia) (HCC)    Type 2 diabetes mellitus with kidney complication, without long-term current use of insulin  (HCC) 05/07/2016   Verrucae vulgaris    Wegner's disease (congenital syphilitic osteochondritis)     Tobacco Use: Social History   Tobacco Use  Smoking Status Never   Passive exposure: Never  Smokeless Tobacco Never    Labs: Review Flowsheet       Latest Ref  Rng & Units 04/10/2023 06/02/2023 07/24/2023  Labs for ITP Cardiac and Pulmonary Rehab  PH, Arterial 7.35 - 7.45 7.364  - -  PCO2 arterial 32 - 48 mmHg 46.4  - -  Bicarbonate 20.0 - 28.0 mmol/L 25.1  24.9  26.4  - -  TCO2 22 - 32 mmol/L 26  26  28  27  29  28    Acid-base deficit 0.0 - 2.0 mmol/L 1.0  1.0  - -  O2 Saturation % 68  71  92  - -    Details       Multiple values from one day are sorted in reverse-chronological order         Capillary Blood Glucose: Lab Results  Component Value Date   GLUCAP 102 (H) 06/02/2023   GLUCAP 79 04/10/2023   GLUCAP 100 (H) 04/10/2023   GLUCAP 117 (H) 02/20/2017   GLUCAP 99 02/20/2017     Exercise Target Goals: Exercise Program Goal: Individual exercise prescription set using results from initial 6 min walk test and THRR while considering  patient's activity barriers and safety.   Exercise Prescription Goal: Initial exercise prescription builds to 30-45 minutes a day of aerobic activity, 2-3 days per week.  Home exercise guidelines will be given to patient during program as part of exercise prescription that the participant will acknowledge.  Activity Barriers & Risk Stratification:  Activity Barriers & Cardiac Risk Stratification - 07/16/23 1041       Activity Barriers & Cardiac Risk Stratification   Activity Barriers Balance Concerns;Arthritis;Joint Problems;Back Problems;Deconditioning;Left Knee Replacement;Right Knee Replacement;Shortness of Breath    Cardiac Risk Stratification High          6 Minute Walk:  6 Minute Walk     Row Name 07/16/23 1203         6 Minute Walk   Phase Initial     Distance 908 feet     Walk Time 6 minutes     # of Rest Breaks 1  2:25-3:40 ( pt made to stop, over THR)     MPH 1.72     METS 2     RPE 14     Perceived Dyspnea  2     VO2 Peak 7.1     Symptoms Yes (comment)     Comments SOB, PRD = 2, 6/10 bilateral foot pain     Resting HR 63 bpm     Resting BP 120/80     Resting Oxygen  Saturation  97 %     Exercise Oxygen Saturation  during 6 min walk 96 %     Max Ex. HR 130 bpm     Max Ex. BP 140/76     2 Minute Post BP 126/76  Oxygen Initial Assessment:   Oxygen Re-Evaluation:   Oxygen Discharge (Final Oxygen Re-Evaluation):   Initial Exercise Prescription:  Initial Exercise Prescription - 07/16/23 1000       Date of Initial Exercise RX and Referring Provider   Date 07/16/23    Referring Provider Freddy Jain, MD    Expected Discharge Date 10/07/23      Recumbant Bike   Level 1    RPM 60    Watts 25    Minutes 15    METs 2      NuStep   Level 1    SPM 75    Minutes 15    METs 2      Prescription Details   Frequency (times per week) 3    Duration Progress to 30 minutes of continuous aerobic without signs/symptoms of physical distress      Intensity   THRR 40-80% of Max Heartrate 61-122    Ratings of Perceived Exertion 11-13    Perceived Dyspnea 0-4      Progression   Progression Continue progressive overload as per policy without signs/symptoms or physical distress.      Resistance Training   Training Prescription Yes    Weight 2 lbs    Reps 10-15          Perform Capillary Blood Glucose checks as needed.  Exercise Prescription Changes:   Exercise Prescription Changes     Row Name 07/22/23 1600 09/02/23 1400           Response to Exercise   Blood Pressure (Admit) 122/70 112/70      Blood Pressure (Exercise) 140/80 120/80      Blood Pressure (Exit) 118/68 110/70      Heart Rate (Admit) 100 bpm 65 bpm      Heart Rate (Exercise) 120 bpm 112 bpm      Heart Rate (Exit) 75 bpm 74 bpm      Rating of Perceived Exertion (Exercise) 11 13      Symptoms None None      Comments Pt's first day in the CRP2 program Reviewed METs and goals      Duration Continue with 30 min of aerobic exercise without signs/symptoms of physical distress. Continue with 30 min of aerobic exercise without signs/symptoms of physical distress.       Intensity THRR unchanged THRR unchanged        Progression   Progression Continue to progress workloads to maintain intensity without signs/symptoms of physical distress. Continue to progress workloads to maintain intensity without signs/symptoms of physical distress.      Average METs 2.1 2.4        Resistance Training   Training Prescription No No      Weight No weights on Wednesdays No weights on Wednesdays        Interval Training   Interval Training No No        Recumbant Bike   Level 1 1      RPM 58 48      Watts 15 24      Minutes 15 15      METs 2.1 2.4        NuStep   Level 2 3      SPM 97 95      Minutes 15 15      METs 2.1 2.4         Exercise Comments:   Exercise Comments     Row Name 07/22/23 1625 09/02/23 1404  Exercise Comments Pt's first day in the CRP2 program. Pt exercised without complaints. Reviewed goals and METs with patient today. Pt is making slow progress.         Exercise Goals and Review:   Exercise Goals     Row Name 07/16/23 1046             Exercise Goals   Increase Physical Activity Yes       Intervention Provide advice, education, support and counseling about physical activity/exercise needs.;Develop an individualized exercise prescription for aerobic and resistive training based on initial evaluation findings, risk stratification, comorbidities and participant's personal goals.       Expected Outcomes Short Term: Attend rehab on a regular basis to increase amount of physical activity.;Long Term: Exercising regularly at least 3-5 days a week.;Long Term: Add in home exercise to make exercise part of routine and to increase amount of physical activity.       Increase Strength and Stamina Yes       Intervention Provide advice, education, support and counseling about physical activity/exercise needs.;Develop an individualized exercise prescription for aerobic and resistive training based on initial evaluation findings, risk  stratification, comorbidities and participant's personal goals.       Expected Outcomes Short Term: Increase workloads from initial exercise prescription for resistance, speed, and METs.;Short Term: Perform resistance training exercises routinely during rehab and add in resistance training at home;Long Term: Improve cardiorespiratory fitness, muscular endurance and strength as measured by increased METs and functional capacity ( )       Able to understand and use rate of perceived exertion (RPE) scale Yes       Intervention Provide education and explanation on how to use RPE scale       Expected Outcomes Short Term: Able to use RPE daily in rehab to express subjective intensity level;Long Term:  Able to use RPE to guide intensity level when exercising independently       Knowledge and understanding of Target Heart Rate Range (THRR) Yes       Intervention Provide education and explanation of THRR including how the numbers were predicted and where they are located for reference       Expected Outcomes Short Term: Able to state/look up THRR;Long Term: Able to use THRR to govern intensity when exercising independently;Short Term: Able to use daily as guideline for intensity in rehab       Understanding of Exercise Prescription Yes       Intervention Provide education, explanation, and written materials on patient's individual exercise prescription       Expected Outcomes Short Term: Able to explain program exercise prescription;Long Term: Able to explain home exercise prescription to exercise independently          Exercise Goals Re-Evaluation :  Exercise Goals Re-Evaluation     Row Name 07/22/23 1624 09/02/23 1402           Exercise Goal Re-Evaluation   Exercise Goals Review Increase Physical Activity;Increase Strength and Stamina;Able to understand and use rate of perceived exertion (RPE) scale;Knowledge and understanding of Target Heart Rate Range (THRR);Understanding of Exercise  Prescription Increase Physical Activity;Increase Strength and Stamina;Able to understand and use rate of perceived exertion (RPE) scale;Knowledge and understanding of Target Heart Rate Range (THRR);Understanding of Exercise Prescription      Comments Pt's first day in the CRP2 program. Pt understands the exercise Rx, RPE scale and THRR. Reviewed METs and goals. Pt voices progress on her goals. Pt has lost 3.1 lbs.  Pt voices motivation to improve her diet and reduce Na. Pt voices impovement in her flexibilty and endurance, both goals.      Expected Outcomes Will continue to monitor patient and progress exercise workloads as tolerated. Will continue to monitor patient and progress exercise workloads as tolerated.         Discharge Exercise Prescription (Final Exercise Prescription Changes):  Exercise Prescription Changes - 09/02/23 1400       Response to Exercise   Blood Pressure (Admit) 112/70    Blood Pressure (Exercise) 120/80    Blood Pressure (Exit) 110/70    Heart Rate (Admit) 65 bpm    Heart Rate (Exercise) 112 bpm    Heart Rate (Exit) 74 bpm    Rating of Perceived Exertion (Exercise) 13    Symptoms None    Comments Reviewed METs and goals    Duration Continue with 30 min of aerobic exercise without signs/symptoms of physical distress.    Intensity THRR unchanged      Progression   Progression Continue to progress workloads to maintain intensity without signs/symptoms of physical distress.    Average METs 2.4      Resistance Training   Training Prescription No    Weight No weights on Wednesdays      Interval Training   Interval Training No      Recumbant Bike   Level 1    RPM 48    Watts 24    Minutes 15    METs 2.4      NuStep   Level 3    SPM 95    Minutes 15    METs 2.4          Nutrition:  Target Goals: Understanding of nutrition guidelines, daily intake of sodium 1500mg , cholesterol 200mg , calories 30% from fat and 7% or less from saturated fats, daily  to have 5 or more servings of fruits and vegetables.  Biometrics:  Pre Biometrics - 07/16/23 1015       Pre Biometrics   Waist Circumference 51 inches    Hip Circumference 53 inches    Waist to Hip Ratio 0.96 %    Triceps Skinfold 44 mm    % Body Fat 53.4 %    Grip Strength 20 kg    Flexibility 10 in    Single Leg Stand 1 seconds           Nutrition Therapy Plan and Nutrition Goals:  Nutrition Therapy & Goals - 08/26/23 0915       Nutrition Therapy   Diet Heart healthy diet    Drug/Food Interactions Statins/Certain Fruits      Personal Nutrition Goals   Nutrition Goal Patient to identify strategies for reducing cardiovascular risk by attending the [employer] education and nutrition series weekly.   goal in progress.   Personal Goal #2 Patient to improve diet quality by using the plate method as a guide for meal planning to include lean protein/plant protein, fruits, vegetables, whole grains, nonfat dairy as part of a well-balanced diet.   goal in progress.   Personal Goal #3 Patient to identify strategies for weight loss of 0.5-2.0# per week.   goal in progress.   Comments Patient recently returned from absence due to awaiting neurology clearance; prior to today's visit, she had not attended since 07/27/2023. 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 Lipids are WNL. A1c is in a prediabetic range. Blood pressure remains stable. Patient is down 4.2# since orientation to our program. Patient will benefit from participation in intensive cardiac rehab for nutrition, exercise, and lifestyle modification.      Intervention Plan   Intervention Prescribe, educate and counsel regarding individualized specific dietary modifications aiming towards targeted core components such as  weight, hypertension, lipid management, diabetes, heart failure and other comorbidities.;Nutrition handout(s) given to patient.    Expected Outcomes Short Term Goal: Understand basic principles of dietary content, such as calories, fat, sodium, cholesterol and nutrients.;Long Term Goal: Adherence to prescribed nutrition plan.          Nutrition Assessments:  Nutrition Assessments - 07/23/23 1510       Rate Your Plate Scores   Pre Score 54         MEDIFICTS Score Key: >=70 Need to make dietary changes  40-70 Heart Healthy Diet <= 40 Therapeutic Level Cholesterol Diet   Flowsheet Row INTENSIVE CARDIAC REHAB from 07/22/2023 in [facility]  Picture Your Plate Total Score on Admission 54   Picture Your Plate Scores: <91 Unhealthy dietary pattern with much room for improvement. 41-50 Dietary pattern unlikely to meet recommendations for good health and room for improvement. 51-60 More healthful dietary pattern, with some room for improvement.  >60 Healthy dietary pattern, although there may be some specific behaviors that could be improved.    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 Lipids are WNL. A1c is in a prediabetic range. Patient will benefit from participation in intensive cardiac rehab for nutrition, exercise, and lifestyle modification. Patient recently returned from absence due to awaiting neurology clearance; prior to today's visit, she had not attended since  07/27/2023. 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 Lipids are WNL. A1c is in a prediabetic range. Blood pressure remains stable. Patient is down 4.2# since orientation to our program. Patient will benefit from participation in intensive cardiac rehab for nutrition, exercise, and lifestyle modification.         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 Lipids are WNL. A1c is in a prediabetic range. Patient will benefit from participation in intensive cardiac rehab for  nutrition, exercise, and lifestyle modification. Patient recently returned from absence due to awaiting neurology clearance; prior to today's visit, she had not attended since 07/27/2023. 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 Lipids are WNL. A1c is in a prediabetic range. Blood pressure remains stable. Patient is down 4.2# since orientation to our program. Patient will benefit from participation in intensive cardiac rehab for nutrition, exercise, and lifestyle modification.         Nutrition Goals Discharge (Final Nutrition Goals Re-Evaluation):   Nutrition Goals Re-Evaluation - 08/26/23 0915       Goals   Current Weight 224 lb 6.9 oz (101.8 kg)    Comment LDL 65, HDL 43, A1c 5.8    Expected Outcome Patient recently returned from absence due to awaiting neurology clearance; prior to today's visit, she had not attended since 07/27/2023. 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 Lipids are WNL. A1c is in a prediabetic range. Blood pressure remains stable. Patient is down 4.2# since orientation to our program. Patient will benefit from participation in intensive cardiac rehab for nutrition, exercise, and lifestyle modification.          Psychosocial: Target Goals: Acknowledge presence or absence of significant depression and/or stress, maximize coping skills, provide positive support system. Participant is able to verbalize types and ability to use techniques and skills needed for reducing stress and depression.  Initial Review & Psychosocial Screening:  Initial Psych Review & Screening - 07/16/23 1047       Initial Review   Current issues with None Identified      Family Dynamics   Good Support System? Yes   Pt has her sister for support     Barriers   Psychosocial barriers to participate in program There are no identifiable barriers or psychosocial needs.      Screening Interventions   Interventions Encouraged to exercise          Quality of Life Scores:  Quality of Life - 07/16/23 1208       Quality of Life   Select Quality of Life      Quality of Life Scores   Health/Function Pre 22.69 %    Socioeconomic Pre 23.5 %    Psych/Spiritual Pre 26.57 %    Family Pre 25.5 %    GLOBAL Pre 24.13 %         Scores of 19 and below usually indicate a poorer quality of life in these areas.  A difference of   2-3 points is a clinically meaningful difference.  A difference of 2-3 points in the total score of the Quality of Life Index has been associated with significant improvement in overall quality of life, self-image, physical symptoms, and general health in studies assessing change in quality of life.  PHQ-9: Review Flowsheet       07/16/2023  Depression screen PHQ 2/9  Decreased Interest 0  Down, Depressed, Hopeless 0  PHQ - 2 Score 0  Altered sleeping 0  Tired, decreased energy 0  Change in appetite 0  Trouble concentrating 2  Moving slowly or fidgety/restless 0  Suicidal thoughts 0  PHQ-9 Score 2  Difficult doing work/chores Not difficult at all  Interpretation of Total Score  Total Score Depression Severity:  1-4 = Minimal depression, 5-9 = Mild depression, 10-14 = Moderate depression, 15-19 = Moderately severe depression, 20-27 = Severe depression   Psychosocial Evaluation and Intervention:   Psychosocial Re-Evaluation:  Psychosocial Re-Evaluation     Row Name 07/23/23 1001 08/04/23 1613 09/01/23 1648         Psychosocial Re-Evaluation   Current issues with None Identified Current Stress Concerns Current Stress Concerns     Comments -- Exercise is currently on hold unable to reevaluate Yahayra has returned to exercise. Helyne has not voiced any increased concerns or stressors during exercise.     Interventions Encouraged to attend Cardiac Rehabilitation for the exercise Encouraged to attend Cardiac Rehabilitation for the exercise Encouraged to attend Cardiac Rehabilitation for the exercise     Continue Psychosocial Services  No Follow up required No Follow up required No Follow up required       Initial Review   Source of Stress Concerns -- Chronic Illness Chronic Illness     Comments -- -- Will continue to monitor and offer support as needed        Psychosocial Discharge (Final Psychosocial Re-Evaluation):  Psychosocial Re-Evaluation - 09/01/23 1648        Psychosocial Re-Evaluation   Current issues with Current Stress Concerns    Comments Lamija has returned to exercise. Zaidee has not voiced any increased concerns or stressors during exercise.    Interventions Encouraged to attend Cardiac Rehabilitation for the exercise    Continue Psychosocial Services  No Follow up required      Initial Review   Source of Stress Concerns Chronic Illness    Comments Will continue to monitor and offer support as needed          Vocational Rehabilitation: Provide vocational rehab assistance to qualifying candidates.   Vocational Rehab Evaluation & Intervention:  Vocational Rehab - 07/16/23 1048       Initial Vocational Rehab Evaluation & Intervention   Assessment shows need for Vocational Rehabilitation No   Pt is retired         Education: Education Goals: Education classes will be provided on a weekly basis, covering required topics. Participant will state understanding/return demonstration of topics presented.    Education     Row Name 07/22/23 1500     Education   Cardiac Education Topics [employer]   Customer service manager   Weekly Topic Adding Flavor - Sodium-Free   Instruction Review Code 1- Verbalizes Understanding   Class Start Time 1400   Class Stop Time 1440   Class Time Calculation (min) 40 min    Row Name 07/24/23 1600     Education   Cardiac Education Topics Pritikin   [employer] Workshops     Workshops   Educator Exercise Physiologist   Select Exercise   Exercise Workshop Location manager and Fall Prevention   Instruction Review Code 1- Verbalizes Understanding   Class Start Time 1407   Class Stop Time 1447   Class Time Calculation (min) 40 min    Row Name 08/26/23 1500     Education   Cardiac Education Topics Pritikin   Customer service manager   Weekly Topic One-Pot Wonders   Instruction Review Code 1- Verbalizes Understanding    Class Start Time 1400   Class Stop Time 1445   Class Time Calculation (min)  45 min    Row Name 08/28/23 1200     Education   Cardiac Education Topics Pritikin   Psychologist, forensic General Education   General Education Hypertension and Heart Disease   Instruction Review Code 1- Verbalizes Understanding   Class Start Time 1155   Class Stop Time 1232   Class Time Calculation (min) 37 min    Row Name 08/31/23 1300     Education   Cardiac Education Topics Pritikin   Geographical information systems officer Psychosocial   Psychosocial Workshop Focused Goals, Sustainable Changes   Instruction Review Code 1- Verbalizes Understanding   Class Start Time 1155   Class Stop Time 1235   Class Time Calculation (min) 40 min    Row Name 09/02/23 1200     Education   Cardiac Education Topics Pritikin     Secondary school teacher   Weekly Topic Comforting Weekend Breakfasts   Instruction Review Code 1- Verbalizes Understanding   Class Start Time 1145   Class Stop Time 1220   Class Time Calculation (min) 35 min      Core Videos: Exercise    Move It!  Clinical staff conducted group or individual video education with verbal and written material and guidebook.  Patient learns the recommended Pritikin exercise program. Exercise with the goal of living a long, healthy life. Some of the health benefits of exercise include controlled diabetes, healthier blood pressure levels, improved cholesterol levels, improved heart and lung capacity, improved sleep, and better body composition. Everyone should speak with their doctor before starting or changing an exercise routine.  Biomechanical Limitations Clinical staff conducted group or individual video education with verbal and written material and guidebook.  Patient learns how biomechanical limitations can impact exercise and how we can  mitigate and possibly overcome limitations to have an impactful and balanced exercise routine.  Body Composition Clinical staff conducted group or individual video education with verbal and written material and guidebook.  Patient learns that body composition (ratio of muscle mass to fat mass) is a key component to assessing overall fitness, rather than body weight alone. Increased fat mass, especially visceral belly fat, can put us  at increased risk for metabolic syndrome, type 2 diabetes, heart disease, and even death. It is recommended to combine diet and exercise (cardiovascular and resistance training) to improve your body composition. Seek guidance from your physician and exercise physiologist before implementing an exercise routine.  Exercise Action Plan Clinical staff conducted group or individual video education with verbal and written material and guidebook.  Patient learns the recommended strategies to achieve and enjoy long-term exercise adherence, including variety, self-motivation, self-efficacy, and positive decision making. Benefits of exercise include fitness, good health, weight management, more energy, better sleep, less stress, and overall well-being.  Medical   Heart Disease Risk Reduction Clinical staff conducted group or individual video education with verbal and written material and guidebook.  Patient learns our heart is our most vital organ as it circulates oxygen, nutrients, white blood cells, and hormones throughout the entire body, and carries waste away. Data supports a plant-based eating plan like the Pritikin Program for its effectiveness in slowing progression of and reversing heart disease. The video provides a number of recommendations to address heart disease.   Metabolic Syndrome and Belly Fat  Clinical staff conducted group or individual video education  with verbal and written material and guidebook.  Patient learns what metabolic syndrome is, how it leads to  heart disease, and how one can reverse it and keep it from coming back. You have metabolic syndrome if you have 3 of the following 5 criteria: abdominal obesity, high blood pressure, high triglycerides, low HDL cholesterol, and high blood sugar.  Hypertension and Heart Disease Clinical staff conducted group or individual video education with verbal and written material and guidebook.  Patient learns that high blood pressure, or hypertension, is very common in the [place] . Hypertension is largely due to excessive salt intake, but other important risk factors include being overweight, physical inactivity, drinking too much alcohol, smoking, and not eating enough potassium from fruits and vegetables. High blood pressure is a leading risk factor for heart attack, stroke, congestive heart failure, dementia, kidney failure, and premature death. Long-term effects of excessive salt intake include stiffening of the arteries and thickening of heart muscle and organ damage. Recommendations include ways to reduce hypertension and the risk of heart disease.  Diseases of Our Time - Focusing on Diabetes Clinical staff conducted group or individual video education with verbal and written material and guidebook.  Patient learns why the best way to stop diseases of our time is prevention, through food and other lifestyle changes. Medicine (such as prescription pills and surgeries) is often only a Band-Aid on the problem, not a long-term solution. Most common diseases of our time include obesity, type 2 diabetes, hypertension, heart disease, and cancer. The Pritikin Program is recommended and has been proven to help reduce, reverse, and/or prevent the damaging effects of metabolic syndrome.  Nutrition   Overview of the Pritikin Eating Plan  Clinical staff conducted group or individual video education with verbal and written material and guidebook.  Patient learns about the Pritikin Eating Plan for disease risk  reduction. The Pritikin Eating Plan emphasizes a wide variety of unrefined, minimally-processed carbohydrates, like fruits, vegetables, whole grains, and legumes. Go, Caution, and Stop food choices are explained. Plant-based and lean animal proteins are emphasized. Rationale provided for low sodium intake for blood pressure control, low added sugars for blood sugar stabilization, and low added fats and oils for coronary artery disease risk reduction and weight management.  Calorie Density  Clinical staff conducted group or individual video education with verbal and written material and guidebook.  Patient learns about calorie density and how it impacts the Pritikin Eating Plan. Knowing the characteristics of the food you choose will help you decide whether those foods will lead to weight gain or weight loss, and whether you want to consume more or less of them. Weight loss is usually a side effect of the Pritikin Eating Plan because of its focus on low calorie-dense foods.  Label Reading  Clinical staff conducted group or individual video education with verbal and written material and guidebook.  Patient learns about the Pritikin recommended label reading guidelines and corresponding recommendations regarding calorie density, added sugars, sodium content, and whole grains.  Dining Out - Part 1  Clinical staff conducted group or individual video education with verbal and written material and guidebook.  Patient learns that restaurant meals can be sabotaging because they can be so high in calories, fat, sodium, and/or sugar. Patient learns recommended strategies on how to positively address this and avoid unhealthy pitfalls.  Facts on Fats  Clinical staff conducted group or individual video education with verbal and written material and guidebook.  Patient learns that lifestyle modifications can be  just as effective, if not more so, as many medications for lowering your risk of heart disease. A  Pritikin lifestyle can help to reduce your risk of inflammation and atherosclerosis (cholesterol build-up, or plaque, in the artery walls). Lifestyle interventions such as dietary choices and physical activity address the cause of atherosclerosis. A review of the types of fats and their impact on blood cholesterol levels, along with dietary recommendations to reduce fat intake is also included.  Nutrition Action Plan  Clinical staff conducted group or individual video education with verbal and written material and guidebook.  Patient learns how to incorporate Pritikin recommendations into their lifestyle. Recommendations include planning and keeping personal health goals in mind as an important part of their success.  Healthy Mind-Set    Healthy Minds, Bodies, Hearts  Clinical staff conducted group or individual video education with verbal and written material and guidebook.  Patient learns how to identify when they are stressed. Video will discuss the impact of that stress, as well as the many benefits of stress management. Patient will also be introduced to stress management techniques. The way we think, act, and feel has an impact on our hearts.  How Our Thoughts Can Heal Our Hearts  Clinical staff conducted group or individual video education with verbal and written material and guidebook.  Patient learns that negative thoughts can cause depression and anxiety. This can result in negative lifestyle behavior and serious health problems. Cognitive behavioral therapy is an effective method to help control our thoughts in order to change and improve our emotional outlook.  Additional Videos:  Exercise    Improving Performance  Clinical staff conducted group or individual video education with verbal and written material and guidebook.  Patient learns to use a non-linear approach by alternating intensity levels and lengths of time spent exercising to help burn more calories and lose more body fat.  Cardiovascular exercise helps improve heart health, metabolism, hormonal balance, blood sugar control, and recovery from fatigue. Resistance training improves strength, endurance, balance, coordination, reaction time, metabolism, and muscle mass. Flexibility exercise improves circulation, posture, and balance. Seek guidance from your physician and exercise physiologist before implementing an exercise routine and learn your capabilities and proper form for all exercise.  Introduction to Yoga  Clinical staff conducted group or individual video education with verbal and written material and guidebook.  Patient learns about yoga, a discipline of the coming together of mind, breath, and body. The benefits of yoga include improved flexibility, improved range of motion, better posture and core strength, increased lung function, weight loss, and positive self-image. Yoga's heart health benefits include lowered blood pressure, healthier heart rate, decreased cholesterol and triglyceride levels, improved immune function, and reduced stress. Seek guidance from your physician and exercise physiologist before implementing an exercise routine and learn your capabilities and proper form for all exercise.  Medical   Aging: Enhancing Your Quality of Life  Clinical staff conducted group or individual video education with verbal and written material and guidebook.  Patient learns key strategies and recommendations to stay in good physical health and enhance quality of life, such as prevention strategies, having an advocate, securing a Health Care Proxy and Power of Attorney, and keeping a list of medications and system for tracking them. It also discusses how to avoid risk for bone loss.  Biology of Weight Control  Clinical staff conducted group or individual video education with verbal and written material and guidebook.  Patient learns that weight gain occurs because we consume more  calories than we burn (eating more,  moving less). Even if your body weight is normal, you may have higher ratios of fat compared to muscle mass. Too much body fat puts you at increased risk for cardiovascular disease, heart attack, stroke, type 2 diabetes, and obesity-related cancers. In addition to exercise, following the Pritikin Eating Plan can help reduce your risk.  Decoding Lab Results  Clinical staff conducted group or individual video education with verbal and written material and guidebook.  Patient learns that lab test reflects one measurement whose values change over time and are influenced by many factors, including medication, stress, sleep, exercise, food, hydration, pre-existing medical conditions, and more. It is recommended to use the knowledge from this video to become more involved with your lab results and evaluate your numbers to speak with your doctor.   Diseases of Our Time - Overview  Clinical staff conducted group or individual video education with verbal and written material and guidebook.  Patient learns that according to the CDC, 50% to 70% of chronic diseases (such as obesity, type 2 diabetes, elevated lipids, hypertension, and heart disease) are avoidable through lifestyle improvements including healthier food choices, listening to satiety cues, and increased physical activity.  Sleep Disorders Clinical staff conducted group or individual video education with verbal and written material and guidebook.  Patient learns how good quality and duration of sleep are important to overall health and well-being. Patient also learns about sleep disorders and how they impact health along with recommendations to address them, including discussing with a physician.  Nutrition  Dining Out - Part 2 Clinical staff conducted group or individual video education with verbal and written material and guidebook.  Patient learns how to plan ahead and communicate in order to maximize their dining experience in a healthy and  nutritious manner. Included are recommended food choices based on the type of restaurant the patient is visiting.   Fueling a Banker conducted group or individual video education with verbal and written material and guidebook.  There is a strong connection between our food choices and our health. Diseases like obesity and type 2 diabetes are very prevalent and are in large-part due to lifestyle choices. The Pritikin Eating Plan provides plenty of food and hunger-curbing satisfaction. It is easy to follow, affordable, and helps reduce health risks.  Menu Workshop  Clinical staff conducted group or individual video education with verbal and written material and guidebook.  Patient learns that restaurant meals can sabotage health goals because they are often packed with calories, fat, sodium, and sugar. Recommendations include strategies to plan ahead and to communicate with the manager, chef, or server to help order a healthier meal.  Planning Your Eating Strategy  Clinical staff conducted group or individual video education with verbal and written material and guidebook.  Patient learns about the Pritikin Eating Plan and its benefit of reducing the risk of disease. The Pritikin Eating Plan does not focus on calories. Instead, it emphasizes high-quality, nutrient-rich foods. By knowing the characteristics of the foods, we choose, we can determine their calorie density and make informed decisions.  Targeting Your Nutrition Priorities  Clinical staff conducted group or individual video education with verbal and written material and guidebook.  Patient learns that lifestyle habits have a tremendous impact on disease risk and progression. This video provides eating and physical activity recommendations based on your personal health goals, such as reducing LDL cholesterol, losing weight, preventing or controlling type 2 diabetes, and reducing high  blood pressure.  Vitamins and  Minerals  Clinical staff conducted group or individual video education with verbal and written material and guidebook.  Patient learns different ways to obtain key vitamins and minerals, including through a recommended healthy diet. It is important to discuss all supplements you take with your doctor.   Healthy Mind-Set    Smoking Cessation  Clinical staff conducted group or individual video education with verbal and written material and guidebook.  Patient learns that cigarette smoking and tobacco addiction pose a serious health risk which affects millions of people. Stopping smoking will significantly reduce the risk of heart disease, lung disease, and many forms of cancer. Recommended strategies for quitting are covered, including working with your doctor to develop a successful plan.  Culinary   Becoming a Set designer conducted group or individual video education with verbal and written material and guidebook.  Patient learns that cooking at home can be healthy, cost-effective, quick, and puts them in control. Keys to cooking healthy recipes will include looking at your recipe, assessing your equipment needs, planning ahead, making it simple, choosing cost-effective seasonal ingredients, and limiting the use of added fats, salts, and sugars.  Cooking - Breakfast and Snacks  Clinical staff conducted group or individual video education with verbal and written material and guidebook.  Patient learns how important breakfast is to satiety and nutrition through the entire day. Recommendations include key foods to eat during breakfast to help stabilize blood sugar levels and to prevent overeating at meals later in the day. Planning ahead is also a key component.  Cooking - Educational psychologist conducted group or individual video education with verbal and written material and guidebook.  Patient learns eating strategies to improve overall health, including an approach  to cook more at home. Recommendations include thinking of animal protein as a side on your plate rather than center stage and focusing instead on lower calorie dense options like vegetables, fruits, whole grains, and plant-based proteins, such as beans. Making sauces in large quantities to freeze for later and leaving the skin on your vegetables are also recommended to maximize your experience.  Cooking - Healthy Salads and Dressing Clinical staff conducted group or individual video education with verbal and written material and guidebook.  Patient learns that vegetables, fruits, whole grains, and legumes are the foundations of the Pritikin Eating Plan. Recommendations include how to incorporate each of these in flavorful and healthy salads, and how to create homemade salad dressings. Proper handling of ingredients is also covered. Cooking - Soups and [employer] - Soups and Desserts Clinical staff conducted group or individual video education with verbal and written material and guidebook.  Patient learns that Pritikin soups and desserts make for easy, nutritious, and delicious snacks and meal components that are low in sodium, fat, sugar, and calorie density, while high in vitamins, minerals, and filling fiber. Recommendations include simple and healthy ideas for soups and desserts.   Overview     The Pritikin Solution Program Overview Clinical staff conducted group or individual video education with verbal and written material and guidebook.  Patient learns that the results of the Pritikin Program have been documented in more than 100 articles published in peer-reviewed journals, and the benefits include reducing risk factors for (and, in some cases, even reversing) high cholesterol, high blood pressure, type 2 diabetes, obesity, and more! An overview of the three key pillars of the Pritikin Program will be covered: eating well, doing  regular exercise, and having a healthy  mind-set.  WORKSHOPS  Exercise: Exercise Basics: Building Your Action Plan Clinical staff led group instruction and group discussion with PowerPoint presentation and patient guidebook. To enhance the learning environment the use of posters, models and videos may be added. At the conclusion of this workshop, patients will comprehend the difference between physical activity and exercise, as well as the benefits of incorporating both, into their routine. Patients will understand the FITT (Frequency, Intensity, Time, and Type) principle and how to use it to build an exercise action plan. In addition, safety concerns and other considerations for exercise and cardiac rehab will be addressed by the presenter. The purpose of this lesson is to promote a comprehensive and effective weekly exercise routine in order to improve patients' overall level of fitness.   Managing Heart Disease: Your Path to a Healthier Heart Clinical staff led group instruction and group discussion with PowerPoint presentation and patient guidebook. To enhance the learning environment the use of posters, models and videos may be added.At the conclusion of this workshop, patients will understand the anatomy and physiology of the heart. Additionally, they will understand how Pritikin's three pillars impact the risk factors, the progression, and the management of heart disease.  The purpose of this lesson is to provide a high-level overview of the heart, heart disease, and how the Pritikin lifestyle positively impacts risk factors.  Exercise Biomechanics Clinical staff led group instruction and group discussion with PowerPoint presentation and patient guidebook. To enhance the learning environment the use of posters, models and videos may be added. Patients will learn how the structural parts of their bodies function and how these functions impact their daily activities, movement, and exercise. Patients will learn how to promote a  neutral spine, learn how to manage pain, and identify ways to improve their physical movement in order to promote healthy living. The purpose of this lesson is to expose patients to common physical limitations that impact physical activity. Participants will learn practical ways to adapt and manage aches and pains, and to minimize their effect on regular exercise. Patients will learn how to maintain good posture while sitting, walking, and lifting.  Balance Training and Fall Prevention  Clinical staff led group instruction and group discussion with PowerPoint presentation and patient guidebook. To enhance the learning environment the use of posters, models and videos may be added. At the conclusion of this workshop, patients will understand the importance of their sensorimotor skills (vision, proprioception, and the vestibular system) in maintaining their ability to balance as they age. Patients will apply a variety of balancing exercises that are appropriate for their current level of function. Patients will understand the common causes for poor balance, possible solutions to these problems, and ways to modify their physical environment in order to minimize their fall risk. The purpose of this lesson is to teach patients about the importance of maintaining balance as they age and ways to minimize their risk of falling.  WORKSHOPS   Nutrition:  Fueling a Ship broker led group instruction and group discussion with PowerPoint presentation and patient guidebook. To enhance the learning environment the use of posters, models and videos may be added. Patients will review the foundational principles of the Pritikin Eating Plan and understand what constitutes a serving size in each of the food groups. Patients will also learn Pritikin-friendly foods that are better choices when away from home and review make-ahead meal and snack options. Calorie density will be reviewed and applied  to  three nutrition priorities: weight maintenance, weight loss, and weight gain. The purpose of this lesson is to reinforce (in a group setting) the key concepts around what patients are recommended to eat and how to apply these guidelines when away from home by planning and selecting Pritikin-friendly options. Patients will understand how calorie density may be adjusted for different weight management goals.  Mindful Eating  Clinical staff led group instruction and group discussion with PowerPoint presentation and patient guidebook. To enhance the learning environment the use of posters, models and videos may be added. Patients will briefly review the concepts of the [facility]in Eating Plan and the importance of low-calorie dense foods. The concept of mindful eating will be introduced as well as the importance of paying attention to internal hunger signals. Triggers for non-hunger eating and techniques for dealing with triggers will be explored. The purpose of this lesson is to provide patients with the opportunity to review the basic principles of the [facility]in Eating Plan, discuss the value of eating mindfully and how to measure internal cues of hunger and fullness using the Hunger Scale. Patients will also discuss reasons for non-hunger eating and learn strategies to use for controlling emotional eating.  Targeting Your Nutrition Priorities Clinical staff led group instruction and group discussion with PowerPoint presentation and patient guidebook. To enhance the learning environment the use of posters, models and videos may be added. Patients will learn how to determine their genetic susceptibility to disease by reviewing their family history. Patients will gain insight into the importance of diet as part of an overall healthy lifestyle in mitigating the impact of genetics and other environmental insults. The purpose of this lesson is to provide patients with the opportunity to assess their personal nutrition  priorities by looking at their family history, their own health history and current risk factors. Patients will also be able to discuss ways of prioritizing and modifying the P[facility]in Eating Plan for their highest risk areas  Menu  Clinical staff led group instruction and group discussion with PowerPoint presentation and patient guidebook. To enhance the learning environment the use of posters, models and videos may be added. Using menus brought in from [employer], or printed from [employer], patients will apply the Pritikin dining out guidelines that were presented in the [employer] video. Patients will also be able to practice these guidelines in a variety of provided scenarios. The purpose of this lesson is to provide patients with the opportunity to practice hands-on learning of the P[facility]in Dining Out guidelines with actual menus and practice scenarios.  Label Reading Clinical staff led group instruction and group discussion with PowerPoint presentation and patient guidebook. To enhance the learning environment the use of posters, models and videos may be added. Patients will review and discuss the [facility]in label reading guidelines presented in [employer]'s Label Reading Educational series video. Using fool labels brought in from local grocery stores and markets, patients will apply the label reading guidelines and determine if the packaged food meet the Pritikin guidelines. The purpose of this lesson is to provide patients with the opportunity to review, discuss, and practice hands-on learning of the [facility]in Label Reading guidelines with actual packaged food labels. Cooking School  [facility]'s [employer] are designed to teach patients ways to prepare quick, simple, and affordable recipes at home. The importance of nutrition's role in chronic disease risk reduction is reflected in its emphasis in the overall [facility]in program. By learning how to prepare essential  core  P[facility]in Eating Plan recipes, patients will increase control over what they eat; be able to customize the flavor of foods without the use of added salt, sugar, or fat; and improve the quality of the food they consume. By learning a set of core recipes which are easily assembled, quickly prepared, and affordable, patients are more likely to prepare more healthy foods at home. These workshops focus on convenient breakfasts, simple entres, side dishes, and desserts which can be prepared with minimal effort and are consistent with nutrition recommendations for cardiovascular risk reduction. Cooking [employer] are taught by a Armed forces logistics/support/administrative officer (RD) who has been trained by the [employer]. The chef or RD has a clear understanding of the importance of minimizing - if not completely eliminating - added fat, sugar, and sodium in recipes. Throughout the series of Cooking School Workshop sessions, patients will learn about healthy ingredients and efficient methods of cooking to build confidence in their capability to prepare    Cooking School weekly topics:  Adding Flavor- Sodium-Free  Fast and Healthy Breakfasts  Powerhouse Plant-Based Proteins  Satisfying Salads and Dressings  Simple Sides and Sauces  International Cuisine-Spotlight on the [employer] Zones  Delicious Desserts  Savory Soups  [employer] - Meals in a Astronomer Appetizers and Snacks  Comforting Weekend Breakfasts  One-Pot Wonders   Fast Evening Meals  Landscape architect Your Pritikin Plate  WORKSHOPS   Healthy Mindset (Psychosocial):  Focused Goals, Sustainable Changes Clinical staff led group instruction and group discussion with PowerPoint presentation and patient guidebook. To enhance the learning environment the use of posters, models and videos may be added. Patients will be able to apply effective goal setting strategies to establish at least one personal goal, and then take  consistent, meaningful action toward that goal. They will learn to identify common barriers to achieving personal goals and develop strategies to overcome them. Patients will also gain an understanding of how our mind-set can impact our ability to achieve goals and the importance of cultivating a positive and growth-oriented mind-set. The purpose of this lesson is to provide patients with a deeper understanding of how to set and achieve personal goals, as well as the tools and strategies needed to overcome common obstacles which may arise along the way.  From Head to Heart: The Power of a Healthy Outlook  Clinical staff led group instruction and group discussion with PowerPoint presentation and patient guidebook. To enhance the learning environment the use of posters, models and videos may be added. Patients will be able to recognize and describe the impact of emotions and mood on physical health. They will discover the importance of self-care and explore self-care practices which may work for them. Patients will also learn how to utilize the 4 C's to cultivate a healthier outlook and better manage stress and challenges. The purpose of this lesson is to demonstrate to patients how a healthy outlook is an essential part of maintaining good health, especially as they continue their cardiac rehab journey.  Healthy Sleep for a Healthy Heart Clinical staff led group instruction and group discussion with PowerPoint presentation and patient guidebook. To enhance the learning environment the use of posters, models and videos may be added. At the conclusion of this workshop, patients will be able to demonstrate knowledge of the importance of sleep to overall health, well-being, and quality of life. They will understand the symptoms of, and treatments for, common sleep disorders. Patients will also be able to  identify daytime and nighttime behaviors which impact sleep, and they will be able to apply these tools to help  manage sleep-related challenges. The purpose of this lesson is to provide patients with a general overview of sleep and outline the importance of quality sleep. Patients will learn about a few of the most common sleep disorders. Patients will also be introduced to the concept of "sleep hygiene," and discover ways to self-manage certain sleeping problems through simple daily behavior changes. Finally, the workshop will motivate patients by clarifying the links between quality sleep and their goals of heart-healthy living.   Recognizing and Reducing Stress Clinical staff led group instruction and group discussion with PowerPoint presentation and patient guidebook. To enhance the learning environment the use of posters, models and videos may be added. At the conclusion of this workshop, patients will be able to understand the types of stress reactions, differentiate between acute and chronic stress, and recognize the impact that chronic stress has on their health. They will also be able to apply different coping mechanisms, such as reframing negative self-talk. Patients will have the opportunity to practice a variety of stress management techniques, such as deep abdominal breathing, progressive muscle relaxation, and/or guided imagery.  The purpose of this lesson is to educate patients on the role of stress in their lives and to provide healthy techniques for coping with it.  Learning Barriers/Preferences:  Learning Barriers/Preferences - 07/16/23 1047       Learning Barriers/Preferences   Learning Barriers Hearing;Sight   Wears readinhg glasses, bilateral hearing aids   Learning Preferences Computer/Internet;Video;Written Material;Pictoral          Education Topics:  Knowledge Questionnaire Score:  Knowledge Questionnaire Score - 07/16/23 1208       Knowledge Questionnaire Score   Pre Score 20/24          Core Components/Risk Factors/Patient Goals at Admission:  Personal Goals and Risk  Factors at Admission - 07/16/23 1049       Core Components/Risk Factors/Patient Goals on Admission    Weight Management Yes;Obesity;Weight Loss    Intervention Weight Management: Develop a combined nutrition and exercise program designed to reach desired caloric intake, while maintaining appropriate intake of nutrient and fiber, sodium and fats, and appropriate energy expenditure required for the weight goal.;Weight Management: Provide education and appropriate resources to help participant work on and attain dietary goals.;Weight Management/Obesity: Establish reasonable short term and long term weight goals.;Obesity: Provide education and appropriate resources to help participant work on and attain dietary goals.    Admit Weight 228 lb 9.9 oz (103.7 kg)    Goal Weight: Short Term 216 lb (98 kg)    Expected Outcomes Short Term: Continue to assess and modify interventions until short term weight is achieved;Long Term: Adherence to nutrition and physical activity/exercise program aimed toward attainment of established weight goal;Weight Loss: Understanding of general recommendations for a balanced deficit meal plan, which promotes 1-2 lb weight loss per week and includes a negative energy balance of [phone] kcal/d;Understanding recommendations for meals to include 15-35% energy as protein, 25-35% energy from fat, 35-60% energy from carbohydrates, less than 200mg  of dietary cholesterol, 20-35 gm of total fiber daily;Understanding of distribution of calorie intake throughout the day with the consumption of 4-5 meals/snacks    Hypertension Yes    Intervention Provide education on lifestyle modifcations including regular physical activity/exercise, weight management, moderate sodium restriction and increased consumption of fresh fruit, vegetables, and low fat dairy, alcohol moderation, and smoking cessation.;Monitor prescription use compliance.  Expected Outcomes Short Term: Continued assessment and  intervention until BP is < 140/76mm HG in hypertensive participants. < 130/70mm HG in hypertensive participants with diabetes, heart failure or chronic kidney disease.;Long Term: Maintenance of blood pressure at goal levels.    Lipids Yes    Intervention Provide education and support for participant on nutrition & aerobic/resistive exercise along with prescribed medications to achieve LDL 70mg , HDL >40mg .    Expected Outcomes Short Term: Participant states understanding of desired cholesterol values and is compliant with medications prescribed. Participant is following exercise prescription and nutrition guidelines.;Long Term: Cholesterol controlled with medications as prescribed, with individualized exercise RX and with personalized nutrition plan. Value goals: LDL < 70mg , HDL > 40 mg.          Core Components/Risk Factors/Patient Goals Review:   Goals and Risk Factor Review     Row Name 07/23/23 1002 08/04/23 1614 09/01/23 1651         Core Components/Risk Factors/Patient Goals Review   Personal Goals Review Weight Management/Obesity;Hypertension;Lipids Weight Management/Obesity;Hypertension;Lipids Weight Management/Obesity;Hypertension;Lipids     Review Aubriana started cardiac rehab on 07/22/23. Braeden did well with exercise. Vital signs were stable Asta started cardiac rehab on 07/22/23. Exercise is currently on hold due to migraine. Ilee has an appointment with her neurologist on 08/19/23 await clearance to return to exercise. Leahna has returned to cardiac rehab per neurology. [place] has increased her workloads slightly. Vital signs have been stable. Jayliah has lost 1.3 kg since starting cardiac rehab     Expected Outcomes Rhona will continnue to participate in cardiac rehab for exercise, nutrition and lifestyle modifications Taleeya will continnue to participate in cardiac rehab for exercise, nutrition and lifestyle modifications Batina will continnue to participate in cardiac rehab for  exercise, nutrition and lifestyle modifications        Core Components/Risk Factors/Patient Goals at Discharge (Final Review):   Goals and Risk Factor Review - 09/01/23 1651       Core Components/Risk Factors/Patient Goals Review   Personal Goals Review Weight Management/Obesity;Hypertension;Lipids    Review Kourtni has returned to cardiac rehab per neurology. Kenyia has increased her workloads slightly. Vital signs have been stable. Jeff has lost 1.3 kg since starting cardiac rehab    Expected Outcomes Elen will continnue to participate in cardiac rehab for exercise, nutrition and lifestyle modifications          ITP Comments:  ITP Comments     Row Name 07/16/23 1033 07/23/23 0958 08/04/23 1612 09/01/23 1646     ITP Comments Gaylyn Keas, MD:  Introduction to the [facility] Education Program / Intensive Cardiac Rehab.  Initial orientation packet reviewed with the patient. 30 Day ITP Review. Shaundrea started cardiac rehab on 07/22/23. Akiko did well with exercise. 30 Day ITP Review. Mylan's exercise at cardiology is currently on hold due to ED visit with migraine await neurology clearance for the patient to resume exercise. 30 Day ITP Review. Tanea has returned to exercise at cardiac rehab and is exercising without difficulty       Comments: See Comments.

## 2023-09-03 ENCOUNTER — Ambulatory Visit: Admitting: Pharmacist Clinician (PhC)/ Clinical Pharmacy Specialist

## 2023-09-04 ENCOUNTER — Encounter (HOSPITAL_COMMUNITY)

## 2023-09-04 ENCOUNTER — Encounter (HOSPITAL_COMMUNITY)
Admission: RE | Admit: 2023-09-04 | Discharge: 2023-09-04 | Disposition: A | Discharge status: Home / Self Care | Source: Ambulatory Visit | Attending: Cardiovascular Disease | Admitting: Cardiovascular Disease

## 2023-09-04 DIAGNOSIS — Z952 Presence of prosthetic heart valve: Secondary | ICD-10-CM

## 2023-09-04 DIAGNOSIS — Z48812 Encounter for surgical aftercare following surgery on the circulatory system: Secondary | ICD-10-CM | POA: Diagnosis not present

## 2023-09-04 NOTE — Progress Notes (Signed)
Reviewed home exercise Rx with patient today.  Encouraged warm-up, cool-down, and stretching. Reviewed THRR of 61-122 and keeping RPE between 11-13. Encouraged to hydrate with activity.  Reviewed weather parameters for temperature and humidity for safe exercise outdoors. Reviewed S/S to terminate exercise and when to call 911 vs MD. Pt encouraged to always carry a cell phone for safety when exercising outdoors. Pt verbalized understanding of the home exercise Rx and was provided a copy.   Lorin Picket MS, ACSM-CEP, CCRP

## 2023-09-07 ENCOUNTER — Encounter (HOSPITAL_COMMUNITY)
Admission: RE | Admit: 2023-09-07 | Discharge: 2023-09-07 | Disposition: A | Discharge status: Home / Self Care | Source: Ambulatory Visit | Attending: Cardiovascular Disease | Admitting: Cardiovascular Disease

## 2023-09-07 ENCOUNTER — Encounter (HOSPITAL_COMMUNITY)

## 2023-09-07 DIAGNOSIS — Z952 Presence of prosthetic heart valve: Secondary | ICD-10-CM

## 2023-09-07 DIAGNOSIS — Z48812 Encounter for surgical aftercare following surgery on the circulatory system: Secondary | ICD-10-CM | POA: Diagnosis not present

## 2023-09-09 ENCOUNTER — Encounter (HOSPITAL_COMMUNITY)

## 2023-09-09 ENCOUNTER — Encounter (HOSPITAL_COMMUNITY)
Admission: RE | Admit: 2023-09-09 | Discharge: 2023-09-09 | Disposition: A | Discharge status: Home / Self Care | Source: Ambulatory Visit | Attending: Cardiovascular Disease | Admitting: Cardiovascular Disease

## 2023-09-09 DIAGNOSIS — Z48812 Encounter for surgical aftercare following surgery on the circulatory system: Secondary | ICD-10-CM | POA: Diagnosis not present

## 2023-09-09 DIAGNOSIS — Z952 Presence of prosthetic heart valve: Secondary | ICD-10-CM

## 2023-09-11 ENCOUNTER — Encounter (HOSPITAL_COMMUNITY)

## 2023-09-14 ENCOUNTER — Encounter (HOSPITAL_COMMUNITY)

## 2023-09-14 ENCOUNTER — Encounter (HOSPITAL_COMMUNITY)
Admission: RE | Admit: 2023-09-14 | Discharge: 2023-09-14 | Disposition: A | Discharge status: Home / Self Care | Source: Ambulatory Visit | Attending: Cardiovascular Disease | Admitting: Cardiovascular Disease

## 2023-09-14 DIAGNOSIS — Z48812 Encounter for surgical aftercare following surgery on the circulatory system: Secondary | ICD-10-CM | POA: Diagnosis not present

## 2023-09-14 DIAGNOSIS — Z952 Presence of prosthetic heart valve: Secondary | ICD-10-CM

## 2023-09-16 ENCOUNTER — Encounter (HOSPITAL_COMMUNITY)

## 2023-09-16 ENCOUNTER — Encounter (HOSPITAL_COMMUNITY)
Admission: RE | Admit: 2023-09-16 | Discharge: 2023-09-16 | Disposition: A | Discharge status: Home / Self Care | Source: Ambulatory Visit | Attending: Cardiovascular Disease | Admitting: Cardiovascular Disease

## 2023-09-16 DIAGNOSIS — Z952 Presence of prosthetic heart valve: Secondary | ICD-10-CM | POA: Insufficient documentation

## 2023-09-21 ENCOUNTER — Encounter (HOSPITAL_COMMUNITY)
Admission: RE | Admit: 2023-09-21 | Discharge: 2023-09-21 | Disposition: A | Discharge status: Home / Self Care | Source: Ambulatory Visit | Attending: Cardiovascular Disease | Admitting: Cardiovascular Disease

## 2023-09-21 ENCOUNTER — Encounter (HOSPITAL_COMMUNITY)

## 2023-09-21 DIAGNOSIS — Z952 Presence of prosthetic heart valve: Secondary | ICD-10-CM | POA: Diagnosis not present

## 2023-09-23 ENCOUNTER — Encounter (HOSPITAL_COMMUNITY)

## 2023-09-23 ENCOUNTER — Encounter (HOSPITAL_COMMUNITY)
Admission: RE | Admit: 2023-09-23 | Discharge: 2023-09-23 | Disposition: A | Discharge status: Home / Self Care | Source: Ambulatory Visit | Attending: Cardiovascular Disease | Admitting: Cardiovascular Disease

## 2023-09-23 DIAGNOSIS — Z952 Presence of prosthetic heart valve: Secondary | ICD-10-CM | POA: Diagnosis not present

## 2023-09-25 ENCOUNTER — Encounter (HOSPITAL_COMMUNITY)
Admission: RE | Admit: 2023-09-25 | Source: Ambulatory Visit | Attending: Cardiovascular Disease | Admitting: Cardiovascular Disease

## 2023-09-25 ENCOUNTER — Encounter (HOSPITAL_COMMUNITY)

## 2023-09-28 ENCOUNTER — Encounter (HOSPITAL_COMMUNITY)

## 2023-09-28 ENCOUNTER — Encounter (HOSPITAL_COMMUNITY)
Admission: RE | Admit: 2023-09-28 | Discharge: 2023-09-28 | Disposition: A | Discharge status: Home / Self Care | Source: Ambulatory Visit | Attending: Cardiovascular Disease | Admitting: Cardiovascular Disease

## 2023-09-28 DIAGNOSIS — Z952 Presence of prosthetic heart valve: Secondary | ICD-10-CM | POA: Diagnosis not present

## 2023-09-29 NOTE — Progress Notes (Signed)
 Cardiac Individual Treatment Plan  Patient Details  Name: Cassandra [name] MRN: [id number] Date of Birth: 1955/04/01 Referring Provider:   Flowsheet Row INTENSIVE CARDIAC REHAB ORIENT from 07/16/2023 in [facility], Vascular, & [facility]  Referring Provider Manus Passe, MD    Initial Encounter Date:  Flowsheet Row INTENSIVE CARDIAC REHAB ORIENT from 07/16/2023 in [facility] for Heart, Vascular, & Lung Health  Date 07/16/23    Visit Diagnosis: 06/02/23 S/P TAVR (transcatheter aortic valve replacement)  Patient's Home Medications on Admission:  Current Outpatient Medications:    acetaminophen  (TYLENOL ) 500 MG tablet, Take 1,000 mg by mouth every 6 (six) hours as needed for moderate pain (pain score 4-6) or mild pain (pain score 1-3)., Disp: , Rfl:    albuterol  (VENTOLIN  HFA) 108 (90 Base) MCG/ACT inhaler, Inhale 2 puffs into the lungs every 6 (six) hours as needed (Congestion)., Disp: , Rfl:    alendronate (FOSAMAX) 70 MG tablet, Take 70 mg by mouth every Sunday. Take with a full glass of water on an empty stomach., Disp: , Rfl:    aspirin  EC 81 MG tablet, Take 1 tablet (81 mg total) by mouth daily. Swallow whole., Disp: , Rfl:    atorvastatin  (LIPITOR) 20 MG tablet, Take 20 mg by mouth at bedtime., Disp: , Rfl:    cetirizine (ZYRTEC) 10 MG tablet, Take 10 mg by mouth in the morning., Disp: , Rfl:    Cholecalciferol  25 MCG (1000 UT) tablet, Take 1,000 Units by mouth daily., Disp: , Rfl:    clindamycin  (CLEOCIN  T) 1 % external solution, Apply topically to bumps at back of neck once to twice daily as needed, Disp: 60 mL, Rfl: 11   clobetasol  (TEMOVATE ) 0.05 % external solution, Apply 1 Application topically 2 (two) times daily. Apply to itchy areas of scalp, Disp: 50 mL, Rfl: 3   doxycycline  (VIBRA -TABS) 100 MG tablet, Take 1 tablet by mouth ONE HOUR BEFORE ANY DENTAL PROCEDURES, INCLUDING CLEANINGS, Disp: 3 tablet, Rfl: 1    escitalopram  (LEXAPRO ) 20 MG tablet, Take 20 mg by mouth in the morning., Disp: , Rfl:    ipratropium (ATROVENT) 0.06 % nasal spray, Place 1 spray into both nostrils at bedtime., Disp: , Rfl:    ketoconazole  (NIZORAL ) 2 % shampoo, Apply 1 Application topically 3 (three) times a week. Wash scalp 3 time weekly, let sit 5 minutes and rinse out (Patient taking differently: Apply 1 Application topically every other day.), Disp: 120 mL, Rfl: 11   montelukast  (SINGULAIR ) 10 MG tablet, Take 10 mg by mouth at bedtime., Disp: , Rfl:    Multiple Vitamin (MULTI-VITAMIN) tablet, Take 1 tablet by mouth daily., Disp: , Rfl:    pantoprazole  (PROTONIX ) 40 MG tablet, Take 40 mg by mouth daily., Disp: , Rfl:    QULIPTA 60 MG TABS, Take 1 tablet by mouth daily., Disp: , Rfl:    Rimegepant Sulfate (NURTEC) 75 MG TBDP, Take 75 mg by mouth daily as needed (for migraines)., Disp: , Rfl:    telmisartan (MICARDIS) 20 MG tablet, Take 20 mg by mouth in the morning., Disp: , Rfl:   Past Medical History: Past Medical History:  Diagnosis Date   Allergy    Anxiety    Complication of anesthesia    difficulty waking up   Depression    GERD (gastroesophageal reflux disease)    Hypertension    Leukocytoclastic vasculitis (HCC)    Dr. Geronimo and Dr. 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 Verlin & Dr. Maryjane   Severe aortic stenosis    Sleep apnea    cpap   SVT (supraventricular tachycardia) (HCC)    Type 2 diabetes mellitus with kidney complication, without long-term current use of insulin  (HCC) 05/07/2016   Verrucae vulgaris    Wegner's disease (congenital syphilitic osteochondritis)     Tobacco Use: Social History   Tobacco Use  Smoking Status Never   Passive exposure: Never  Smokeless Tobacco Never    Labs: Review Flowsheet       Latest Ref  Rng & Units 04/10/2023 06/02/2023 07/24/2023  Labs for ITP Cardiac and Pulmonary Rehab  PH, Arterial 7.35 - 7.45 7.364  - -  PCO2 arterial 32 - 48 mmHg 46.4  - -  Bicarbonate 20.0 - 28.0 mmol/L 25.1  24.9  26.4  - -  TCO2 22 - 32 mmol/L 26  26  28  27  29  28    Acid-base deficit 0.0 - 2.0 mmol/L 1.0  1.0  - -  O2 Saturation % 68  71  92  - -    Details       Multiple values from one day are sorted in reverse-chronological order         Capillary Blood Glucose: Lab Results  Component Value Date   GLUCAP 102 (H) 06/02/2023   GLUCAP 79 04/10/2023   GLUCAP 100 (H) 04/10/2023   GLUCAP 117 (H) 02/20/2017   GLUCAP 99 02/20/2017     Exercise Target Goals: Exercise Program Goal: Individual exercise prescription set using results from initial 6 min walk test and THRR while considering  patient's activity barriers and safety.   Exercise Prescription Goal: Initial exercise prescription builds to 30-45 minutes a day of aerobic activity, 2-3 days per week.  Home exercise guidelines will be given to patient during program as part of exercise prescription that the participant will acknowledge.  Activity Barriers & Risk Stratification:  Activity Barriers & Cardiac Risk Stratification - 07/16/23 1041       Activity Barriers & Cardiac Risk Stratification   Activity Barriers Balance Concerns;Arthritis;Joint Problems;Back Problems;Deconditioning;Left Knee Replacement;Right Knee Replacement;Shortness of Breath    Cardiac Risk Stratification High          6 Minute Walk:  6 Minute Walk     Row Name 07/16/23 1203         6 Minute Walk   Phase Initial     Distance 908 feet     Walk Time 6 minutes     # of Rest Breaks 1  2:25-3:40 ( pt made to stop, over THR)     MPH 1.72     METS 2     RPE 14     Perceived Dyspnea  2     VO2 Peak 7.1     Symptoms Yes (comment)     Comments SOB, PRD = 2, 6/10 bilateral foot pain     Resting HR 63 bpm     Resting BP 120/80     Resting Oxygen  Saturation  97 %     Exercise Oxygen Saturation  during 6 min walk 96 %     Max Ex. HR 130 bpm     Max Ex. BP 140/76     2 Minute Post BP 126/76  Oxygen Initial Assessment:   Oxygen Re-Evaluation:   Oxygen Discharge (Final Oxygen Re-Evaluation):   Initial Exercise Prescription:  Initial Exercise Prescription - 07/16/23 1000       Date of Initial Exercise RX and Referring Provider   Date 07/16/23    Referring Provider Manus Passe, MD    Expected Discharge Date 10/07/23      Recumbant Bike   Level 1    RPM 60    Watts 25    Minutes 15    METs 2      NuStep   Level 1    SPM 75    Minutes 15    METs 2      Prescription Details   Frequency (times per week) 3    Duration Progress to 30 minutes of continuous aerobic without signs/symptoms of physical distress      Intensity   THRR 40-80% of Max Heartrate 61-122    Ratings of Perceived Exertion 11-13    Perceived Dyspnea 0-4      Progression   Progression Continue progressive overload as per policy without signs/symptoms or physical distress.      Resistance Training   Training Prescription Yes    Weight 2 lbs    Reps 10-15          Perform Capillary Blood Glucose checks as needed.  Exercise Prescription Changes:   Exercise Prescription Changes     Row Name 07/22/23 1600 09/02/23 1400 09/04/23 1600 09/16/23 1500       Response to Exercise   Blood Pressure (Admit) 122/70 112/70 120/70 100/72    Blood Pressure (Exercise) 140/80 120/80 140/70 146/78    Blood Pressure (Exit) 118/68 110/70 114/72 124/72    Heart Rate (Admit) 100 bpm 65 bpm 67 bpm 70 bpm    Heart Rate (Exercise) 120 bpm 112 bpm 115 bpm 130 bpm    Heart Rate (Exit) 75 bpm 74 bpm 65 bpm 79 bpm    Rating of Perceived Exertion (Exercise) 11 13 11 13     Symptoms None None None None    Comments Pt's first day in the CRP2 program Reviewed METs and goals Reviewed HExRx Reviewed METs    Duration Continue with 30 min of aerobic exercise  without signs/symptoms of physical distress. Continue with 30 min of aerobic exercise without signs/symptoms of physical distress. Continue with 30 min of aerobic exercise without signs/symptoms of physical distress. Continue with 30 min of aerobic exercise without signs/symptoms of physical distress.    Intensity THRR unchanged THRR unchanged THRR unchanged THRR unchanged      Progression   Progression Continue to progress workloads to maintain intensity without signs/symptoms of physical distress. Continue to progress workloads to maintain intensity without signs/symptoms of physical distress. Continue to progress workloads to maintain intensity without signs/symptoms of physical distress. Continue to progress workloads to maintain intensity without signs/symptoms of physical distress.    Average METs 2.1 2.4 2.3 2.75      Resistance Training   Training Prescription No No Yes No    Weight No weights on Wednesdays No weights on Wednesdays 2 lbs No weights on Wednesdays    Reps -- -- 10-15 --    Time -- -- 5 Minutes --      Interval Training   Interval Training No No No No      Recumbant Bike   Level 1 1 1 4     RPM 58 48 57 47    Watts 15 24 29  39  Minutes 15 15 15 15     METs 2.1 2.4 2.5 3      NuStep   Level 2 3 3 4     SPM 97 95 97 97    Minutes 15 15 15 15     METs 2.1 2.4 2.1 2.5      Home Exercise Plan   Plans to continue exercise at -- -- Home (comment) Home (comment)    Frequency -- -- Add 2 additional days to program exercise sessions. Add 2 additional days to program exercise sessions.    Initial Home Exercises Provided -- -- 09/04/23 09/04/23       Exercise Comments:   Exercise Comments     Row Name 07/22/23 1625 09/02/23 1404 09/04/23 1624 09/16/23 1530     Exercise Comments Pt's first day in the CRP2 program. Pt exercised without complaints. Reviewed goals and METs with patient today. Pt is making slow progress. Reviewed home exercie Rx. Pt will begin walking or  doing chair exercises 2x week working up to 30 minutes. Pt voices she may even join a gym. Pt verbalized understanding of the home exercise Rx and was provided a copy. Reviewed METs with patient today. Pt is progressing and has made several workload increases since her last review. Pt is motivated and speaking of rejoining the [employer]. Pt voices trying to eat a healthier diet. Pt achieved her peak METs of 3.0 today.       Exercise Goals and Review:   Exercise Goals     Row Name 07/16/23 1046             Exercise Goals   Increase Physical Activity Yes       Intervention Provide advice, education, support and counseling about physical activity/exercise needs.;Develop an individualized exercise prescription for aerobic and resistive training based on initial evaluation findings, risk stratification, comorbidities and participant's personal goals.       Expected Outcomes Short Term: Attend rehab on a regular basis to increase amount of physical activity.;Long Term: Exercising regularly at least 3-5 days a week.;Long Term: Add in home exercise to make exercise part of routine and to increase amount of physical activity.       Increase Strength and Stamina Yes       Intervention Provide advice, education, support and counseling about physical activity/exercise needs.;Develop an individualized exercise prescription for aerobic and resistive training based on initial evaluation findings, risk stratification, comorbidities and participant's personal goals.       Expected Outcomes Short Term: Increase workloads from initial exercise prescription for resistance, speed, and METs.;Short Term: Perform resistance training exercises routinely during rehab and add in resistance training at home;Long Term: Improve cardiorespiratory fitness, muscular endurance and strength as measured by increased METs and functional capacity ( )       Able to understand and use rate of perceived exertion (RPE) scale Yes        Intervention Provide education and explanation on how to use RPE scale       Expected Outcomes Short Term: Able to use RPE daily in rehab to express subjective intensity level;Long Term:  Able to use RPE to guide intensity level when exercising independently       Knowledge and understanding of Target Heart Rate Range (THRR) Yes       Intervention Provide education and explanation of THRR including how the numbers were predicted and where they are located for reference       Expected Outcomes Short Term: Able to state/look  up THRR;Long Term: Able to use THRR to govern intensity when exercising independently;Short Term: Able to use daily as guideline for intensity in rehab       Understanding of Exercise Prescription Yes       Intervention Provide education, explanation, and written materials on patient's individual exercise prescription       Expected Outcomes Short Term: Able to explain program exercise prescription;Long Term: Able to explain home exercise prescription to exercise independently          Exercise Goals Re-Evaluation :  Exercise Goals Re-Evaluation     Row Name 07/22/23 1624 09/02/23 1402           Exercise Goal Re-Evaluation   Exercise Goals Review Increase Physical Activity;Increase Strength and Stamina;Able to understand and use rate of perceived exertion (RPE) scale;Knowledge and understanding of Target Heart Rate Range (THRR);Understanding of Exercise Prescription Increase Physical Activity;Increase Strength and Stamina;Able to understand and use rate of perceived exertion (RPE) scale;Knowledge and understanding of Target Heart Rate Range (THRR);Understanding of Exercise Prescription      Comments Pt's first day in the CRP2 program. Pt understands the exercise Rx, RPE scale and THRR. Reviewed METs and goals. Pt voices progress on her goals. Pt has lost 3.1 lbs. Pt voices motivation to improve her diet and reduce Na. Pt voices impovement in her flexibilty and endurance, both  goals.      Expected Outcomes Will continue to monitor patient and progress exercise workloads as tolerated. Will continue to monitor patient and progress exercise workloads as tolerated.         Discharge Exercise Prescription (Final Exercise Prescription Changes):  Exercise Prescription Changes - 09/16/23 1500       Response to Exercise   Blood Pressure (Admit) 100/72    Blood Pressure (Exercise) 146/78    Blood Pressure (Exit) 124/72    Heart Rate (Admit) 70 bpm    Heart Rate (Exercise) 130 bpm    Heart Rate (Exit) 79 bpm    Rating of Perceived Exertion (Exercise) 13    Symptoms None    Comments Reviewed METs    Duration Continue with 30 min of aerobic exercise without signs/symptoms of physical distress.    Intensity THRR unchanged      Progression   Progression Continue to progress workloads to maintain intensity without signs/symptoms of physical distress.    Average METs 2.75      Resistance Training   Training Prescription No    Weight No weights on Wednesdays      Interval Training   Interval Training No      Recumbant Bike   Level 4    RPM 47    Watts 39    Minutes 15    METs 3      NuStep   Level 4    SPM 97    Minutes 15    METs 2.5      Home Exercise Plan   Plans to continue exercise at Home (comment)    Frequency Add 2 additional days to program exercise sessions.    Initial Home Exercises Provided 09/04/23          Nutrition:  Target Goals: Understanding of nutrition guidelines, daily intake of sodium 1500mg , cholesterol 200mg , calories 30% from fat and 7% or less from saturated fats, daily to have 5 or more servings of fruits and vegetables.  Biometrics:  Pre Biometrics - 07/16/23 1015       Pre Biometrics   Waist Circumference  51 inches    Hip Circumference 53 inches    Waist to Hip Ratio 0.96 %    Triceps Skinfold 44 mm    % Body Fat 53.4 %    Grip Strength 20 kg    Flexibility 10 in    Single Leg Stand 1 seconds            Nutrition Therapy Plan and Nutrition Goals:  Nutrition Therapy & Goals - 09/21/23 1451       Nutrition Therapy   Diet Heart healthy diet    Drug/Food Interactions Statins/Certain Fruits      Personal Nutrition Goals   Nutrition Goal Patient to identify strategies for reducing cardiovascular risk by attending the [facility] education and nutrition series weekly.   goal in progress.   Personal Goal #2 Patient to improve diet quality by using the plate method as a guide for meal planning to include lean protein/plant protein, fruits, vegetables, whole grains, nonfat dairy as part of a well-balanced diet.   goal in progress.   Personal Goal #3 Patient to identify strategies for weight loss of 0.5-2.0# per week.   goal in progress.   Comments Goals in progress. 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 She continues to attend the [facility] education and nutrition series regularly. She reports improved mood and motivation since changing anti-depressant. Lipids are WNL. A1c is in a prediabetic range. Blood pressure remains stable. Patient is down 4.4# since orientation to our program. Patient will benefit from participation in intensive cardiac rehab for nutrition, exercise, and lifestyle modification.      Intervention Plan   Intervention Prescribe, educate and counsel regarding individualized specific dietary modifications aiming towards targeted core components such as weight, hypertension, lipid management, diabetes, heart failure and other comorbidities.;Nutrition handout(s) given to patient.    Expected Outcomes Short Term Goal: Understand basic principles of dietary content, such as calories, fat, sodium, cholesterol and nutrients.;Long Term Goal: Adherence to prescribed  nutrition plan.          Nutrition Assessments:  Nutrition Assessments - 07/23/23 1510       Rate Your Plate Scores   Pre Score 54         MEDIFICTS Score Key: >=70 Need to make dietary changes  40-70 Heart Healthy Diet <= 40 Therapeutic Level Cholesterol Diet   Flowsheet Row INTENSIVE CARDIAC REHAB from 07/22/2023 in [facility] for Heart, Vascular, & Lung Health  Picture Your Plate Total Score on Admission 54   Picture Your Plate Scores: <59 Unhealthy dietary pattern with much room for improvement. 41-50 Dietary pattern unlikely to meet recommendations for good health and room for improvement. 51-60 More healthful dietary pattern, with some room for improvement.  >60 Healthy dietary pattern, although there may be some specific behaviors that could be improved.    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 Lipids are WNL. A1c is in a prediabetic range. Patient will benefit from participation in intensive cardiac rehab for nutrition, exercise, and lifestyle modification. Patient recently returned from absence due to awaiting neurology clearance; prior to today's visit, she had not attended since 07/27/2023. 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 Lipids are WNL. A1c is in a prediabetic range. Blood pressure remains stable. Patient is down 4.2# since orientation to our program. Patient will benefit from participation in intensive cardiac rehab for nutrition, exercise, and lifestyle modification. Goals in progress. 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 She continues to attend the [facility] education and nutrition series regularly. She reports improved mood and motivation since changing anti-depressant. Lipids are WNL. A1c is in a prediabetic range. Blood pressure remains stable. Patient is down 4.4# since orientation to our program. Patient will benefit from participation in intensive cardiac rehab for nutrition, exercise, and lifestyle modification.        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 Lipids are WNL. A1c is in a prediabetic range. Patient will benefit from participation in intensive cardiac rehab for nutrition, exercise, and lifestyle modification. Patient recently returned from absence due to awaiting neurology clearance; prior to today's visit, she had not attended since 07/27/2023. 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 Lipids are WNL. A1c is in a prediabetic range. Blood pressure remains stable. Patient is down 4.2# since orientation to our program. Patient will benefit from participation in intensive cardiac rehab for nutrition, exercise, and lifestyle modification. Goals in progress. 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 She continues to attend the [facility] education and nutrition series regularly. She reports improved mood and motivation since changing anti-depressant. Lipids are WNL. A1c is in a prediabetic range. Blood pressure remains stable. Patient is down 4.4# since orientation to our program. Patient will benefit from participation in intensive cardiac rehab for nutrition, exercise, and lifestyle modification.        Nutrition Goals Discharge (Final Nutrition Goals Re-Evaluation):  Nutrition Goals Re-Evaluation - 09/21/23 1451       Goals   Current Weight 224 lb 3.3 oz (101.7 kg)    Comment  LDL 65, HDL 43, A1c 5.8    Expected Outcome Goals in progress. 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 She continues to attend the [facility] education and nutrition series regularly. She reports improved mood and motivation since changing anti-depressant. Lipids are WNL. A1c is in a prediabetic range. Blood pressure remains stable. Patient is down 4.4# since orientation to our program. Patient will benefit from participation in  intensive cardiac rehab for nutrition, exercise, and lifestyle modification.          Psychosocial: Target Goals: Acknowledge presence or absence of significant depression and/or stress, maximize coping skills, provide positive support system. Participant is able to verbalize types and ability to use techniques and skills needed for reducing stress and depression.  Initial Review & Psychosocial Screening:  Initial Psych Review & Screening - 07/16/23 1047       Initial Review   Current issues with None Identified      Family Dynamics   Good Support System? Yes   Pt has her sister for support     Barriers   Psychosocial barriers to participate in program There are no identifiable barriers or psychosocial needs.      Screening Interventions   Interventions Encouraged to exercise          Quality of Life Scores:  Quality of Life - 07/16/23 1208       Quality of Life   Select Quality of Life      Quality of Life Scores   Health/Function Pre 22.69 %    Socioeconomic Pre 23.5 %    Psych/Spiritual Pre 26.57 %    Family Pre 25.5 %    GLOBAL Pre 24.13 %         Scores of 19 and below usually indicate a poorer quality of life in these areas.  A difference of  2-3 points is a clinically meaningful difference.  A difference of 2-3 points in the total score of the Quality of Life Index has been associated with significant improvement in overall quality of life, self-image, physical symptoms, and general health in studies assessing change in quality of life.  PHQ-9: Review Flowsheet       07/16/2023  Depression screen PHQ 2/9  Decreased Interest 0  Down, Depressed, Hopeless 0  PHQ - 2 Score 0  Altered sleeping 0  Tired, decreased energy 0  Change in appetite 0  Trouble concentrating 2  Moving slowly or fidgety/restless 0  Suicidal thoughts 0  PHQ-9 Score 2  Difficult doing work/chores Not difficult at all   Interpretation of Total Score  Total Score Depression  Severity:  1-4 = Minimal depression, 5-9 = Mild depression, 10-14 = Moderate depression, 15-19 = Moderately severe depression, 20-27 = Severe depression   Psychosocial Evaluation and Intervention:   Psychosocial Re-Evaluation:  Psychosocial Re-Evaluation     Row Name 07/23/23 1001 08/04/23 1613 09/01/23 1648 09/23/23 0756       Psychosocial Re-Evaluation   Current issues with None Identified Current Stress Concerns Current Stress Concerns Current Stress Concerns    Comments -- Exercise is currently on hold unable to reevaluate Cassandra [name] has returned to exercise. Cassandra [name] has not voiced any increased concerns or stressors during exercise. Cassandra [name] has not voiced any increased concerns or stressors during exercise. Cassandra [name] said she has not had a migraine in over a week  Interventions Encouraged to attend Cardiac Rehabilitation for the exercise Encouraged to attend Cardiac Rehabilitation for the exercise Encouraged to attend Cardiac Rehabilitation for the exercise Encouraged to attend Cardiac Rehabilitation for the exercise    Continue Psychosocial Services  No Follow up required No Follow up required No Follow up required No Follow up required      Initial Review   Source of Stress Concerns -- Chronic Illness Chronic Illness Chronic Illness    Comments -- -- Will continue to monitor and offer support as needed Will continue to monitor and offer support as needed       Psychosocial Discharge (Final Psychosocial Re-Evaluation):  Psychosocial Re-Evaluation - 09/23/23 0756       Psychosocial Re-Evaluation   Current issues with Current Stress Concerns    Comments Cassandra [name] has not voiced any increased concerns or stressors during exercise. Cassandra [name] said she has not had a migraine in over a week    Interventions Encouraged to attend Cardiac Rehabilitation for the exercise    Continue Psychosocial Services  No Follow up required      Initial Review   Source of Stress Concerns Chronic Illness     Comments Will continue to monitor and offer support as needed          Vocational Rehabilitation: Provide vocational rehab assistance to qualifying candidates.   Vocational Rehab Evaluation & Intervention:  Vocational Rehab - 07/16/23 1048       Initial Vocational Rehab Evaluation & Intervention   Assessment shows need for Vocational Rehabilitation No   Pt is retired         Education: Education Goals: Education classes will be provided on a weekly basis, covering required topics. Participant will state understanding/return demonstration of topics presented.    Education     Row Name 07/22/23 1500     Education   Cardiac Education Topics Pritikin   Customer service manager   Weekly Topic Adding Flavor - Sodium-Free   Instruction Review Code 1- Verbalizes Understanding   Class Start Time 1400   Class Stop Time 1440   Class Time Calculation (min) 40 min    Row Name 07/24/23 1600     Education   Cardiac Education Topics Pritikin   Select Workshops     Workshops   Educator Exercise Physiologist   Select Exercise   Exercise Workshop Location manager and Fall Prevention   Instruction Review Code 1- Verbalizes Understanding   Class Start Time 1407   Class Stop Time 1447   Class Time Calculation (min) 40 min    Row Name 08/26/23 1500     Education   Cardiac Education Topics Pritikin   Set designer Dietitian   Weekly Topic One-Pot Wonders   Instruction Review Code 1- Verbalizes Understanding   Class Start Time 1400   Class Stop Time 1445   Class Time Calculation (min) 45 min    Row Name 08/28/23 1200     Education   Cardiac Education Topics Pritikin   Psychologist, forensic General Education   General Education Hypertension and Heart Disease   Instruction Review Code 1- Verbalizes Understanding   Class Start Time 1155    Class Stop Time 1232   Class Time Calculation (min) 37 min    Row Name 08/31/23 1300  Education   Cardiac Education Topics Pritikin   Geographical information systems officer Psychosocial   Psychosocial Workshop Focused Goals, Sustainable Changes   Instruction Review Code 1- Verbalizes Understanding   Class Start Time 1155   Class Stop Time 1235   Class Time Calculation (min) 40 min    Row Name 09/02/23 1200     Education   Cardiac Education Topics Pritikin     Secondary school teacher   Weekly Topic Comforting Weekend Breakfasts   Instruction Review Code 1- Verbalizes Understanding   Class Start Time 1145   Class Stop Time 1220   Class Time Calculation (min) 35 min    Row Name 09/04/23 1100     Education   Cardiac Education Topics Pritikin   Nurse, children's Exercise Physiologist   Select Nutrition   Nutrition Dining Out - Part 1   Instruction Review Code 1- Verbalizes Understanding   Class Start Time 1147   Class Stop Time 1226   Class Time Calculation (min) 39 min    Row Name 09/07/23 1200     Education   Cardiac Education Topics Pritikin   Psychologist, forensic Exercise Education   Exercise Education Biomechanial Limitations   Instruction Review Code 1- Verbalizes Understanding   Class Start Time 1155   Class Stop Time 1235   Class Time Calculation (min) 40 min    Row Name 09/09/23 1300     Education   Cardiac Education Topics Pritikin   Customer service manager   Weekly Topic Fast Evening Meals   Instruction Review Code 1- Verbalizes Understanding   Class Start Time 1145   Class Stop Time 1225   Class Time Calculation (min) 40 min    Row Name 09/14/23 1600     Education   Cardiac Education Topics Pritikin   Scientist, water quality Nutrition   Nutrition Workshop Fueling a Forensic psychologist   Instruction Review Code 1- Tax inspector   Class Start Time 1145   Class Stop Time 1230   Class Time Calculation (min) 45 min    Row Name 09/16/23 1500     Education   Cardiac Education Topics Pritikin   Customer service manager   Weekly Topic International Cuisine- Spotlight on the [employer] Zones   Instruction Review Code 1- Verbalizes Understanding   Class Start Time 1145   Class Stop Time 1225   Class Time Calculation (min) 40 min    Row Name 09/21/23 1300     Education   Cardiac Education Topics Pritikin   Geographical information systems officer Psychosocial   Psychosocial Workshop Healthy Sleep for a Healthy Heart   Instruction Review Code 1- Verbalizes Understanding   Class Start Time 1150   Class Stop Time 1230   Class Time Calculation (min) 40 min    Row Name 09/23/23 1100     Education   Cardiac Education Topics Pritikin   Customer service manager   Weekly Topic Simple Sides and Sauces  Instruction Review Code 1- Verbalizes Understanding   Class Start Time 1145   Class Stop Time 1225   Class Time Calculation (min) 40 min    Row Name 09/28/23 1200     Education   Cardiac Education Topics Pritikin   Hospital doctor Education   General Education Hypertension and Heart Disease   Instruction Review Code 1- Verbalizes Understanding   Class Start Time 1150   Class Stop Time 1225   Class Time Calculation (min) 35 min      Core Videos: Exercise    Move It!  Clinical staff conducted group or individual video education with verbal and written material and guidebook.  Patient learns the recommended Pritikin exercise program. Exercise with the goal of living a long, healthy life. Some of the health benefits  of exercise include controlled diabetes, healthier blood pressure levels, improved cholesterol levels, improved heart and lung capacity, improved sleep, and better body composition. Everyone should speak with their doctor before starting or changing an exercise routine.  Biomechanical Limitations Clinical staff conducted group or individual video education with verbal and written material and guidebook.  Patient learns how biomechanical limitations can impact exercise and how we can mitigate and possibly overcome limitations to have an impactful and balanced exercise routine.  Body Composition Clinical staff conducted group or individual video education with verbal and written material and guidebook.  Patient learns that body composition (ratio of muscle mass to fat mass) is a key component to assessing overall fitness, rather than body weight alone. Increased fat mass, especially visceral belly fat, can put us  at increased risk for metabolic syndrome, type 2 diabetes, heart disease, and even death. It is recommended to combine diet and exercise (cardiovascular and resistance training) to improve your body composition. Seek guidance from your physician and exercise physiologist before implementing an exercise routine.  Exercise Action Plan Clinical staff conducted group or individual video education with verbal and written material and guidebook.  Patient learns the recommended strategies to achieve and enjoy long-term exercise adherence, including variety, self-motivation, self-efficacy, and positive decision making. Benefits of exercise include fitness, good health, weight management, more energy, better sleep, less stress, and overall well-being.  Medical   Heart Disease Risk Reduction Clinical staff conducted group or individual video education with verbal and written material and guidebook.  Patient learns our heart is our most vital organ as it circulates oxygen, nutrients, white blood cells,  and hormones throughout the entire body, and carries waste away. Data supports a plant-based eating plan like the Pritikin Program for its effectiveness in slowing progression of and reversing heart disease. The video provides a number of recommendations to address heart disease.   Metabolic Syndrome and Belly Fat  Clinical staff conducted group or individual video education with verbal and written material and guidebook.  Patient learns what metabolic syndrome is, how it leads to heart disease, and how one can reverse it and keep it from coming back. You have metabolic syndrome if you have 3 of the following 5 criteria: abdominal obesity, high blood pressure, high triglycerides, low HDL cholesterol, and high blood sugar.  Hypertension and Heart Disease Clinical staff conducted group or individual video education with verbal and written material and guidebook.  Patient learns that high blood pressure, or hypertension, is very common in the [place] . Hypertension is largely due to excessive salt intake, but other important risk factors include being  overweight, physical inactivity, drinking too much alcohol, smoking, and not eating enough potassium from fruits and vegetables. High blood pressure is a leading risk factor for heart attack, stroke, congestive heart failure, dementia, kidney failure, and premature death. Long-term effects of excessive salt intake include stiffening of the arteries and thickening of heart muscle and organ damage. Recommendations include ways to reduce hypertension and the risk of heart disease.  Diseases of Our Time - Focusing on Diabetes Clinical staff conducted group or individual video education with verbal and written material and guidebook.  Patient learns why the best way to stop diseases of our time is prevention, through food and other lifestyle changes. Medicine (such as prescription pills and surgeries) is often only a Band-Aid on the problem, not a long-term  solution. Most common diseases of our time include obesity, type 2 diabetes, hypertension, heart disease, and cancer. The Pritikin Program is recommended and has been proven to help reduce, reverse, and/or prevent the damaging effects of metabolic syndrome.  Nutrition   Overview of the Pritikin Eating Plan  Clinical staff conducted group or individual video education with verbal and written material and guidebook.  Patient learns about the Pritikin Eating Plan for disease risk reduction. The Pritikin Eating Plan emphasizes a wide variety of unrefined, minimally-processed carbohydrates, like fruits, vegetables, whole grains, and legumes. Go, Caution, and Stop food choices are explained. Plant-based and lean animal proteins are emphasized. Rationale provided for low sodium intake for blood pressure control, low added sugars for blood sugar stabilization, and low added fats and oils for coronary artery disease risk reduction and weight management.  Calorie Density  Clinical staff conducted group or individual video education with verbal and written material and guidebook.  Patient learns about calorie density and how it impacts the Pritikin Eating Plan. Knowing the characteristics of the food you choose will help you decide whether those foods will lead to weight gain or weight loss, and whether you want to consume more or less of them. Weight loss is usually a side effect of the Pritikin Eating Plan because of its focus on low calorie-dense foods.  Label Reading  Clinical staff conducted group or individual video education with verbal and written material and guidebook.  Patient learns about the Pritikin recommended label reading guidelines and corresponding recommendations regarding calorie density, added sugars, sodium content, and whole grains.  Dining Out - Part 1  Clinical staff conducted group or individual video education with verbal and written material and guidebook.  Patient learns that  restaurant meals can be sabotaging because they can be so high in calories, fat, sodium, and/or sugar. Patient learns recommended strategies on how to positively address this and avoid unhealthy pitfalls.  Facts on Fats  Clinical staff conducted group or individual video education with verbal and written material and guidebook.  Patient learns that lifestyle modifications can be just as effective, if not more so, as many medications for lowering your risk of heart disease. A Pritikin lifestyle can help to reduce your risk of inflammation and atherosclerosis (cholesterol build-up, or plaque, in the artery walls). Lifestyle interventions such as dietary choices and physical activity address the cause of atherosclerosis. A review of the types of fats and their impact on blood cholesterol levels, along with dietary recommendations to reduce fat intake is also included.  Nutrition Action Plan  Clinical staff conducted group or individual video education with verbal and written material and guidebook.  Patient learns how to incorporate Pritikin recommendations into their lifestyle. Recommendations include  planning and keeping personal health goals in mind as an important part of their success.  Healthy Mind-Set    Healthy Minds, Bodies, Hearts  Clinical staff conducted group or individual video education with verbal and written material and guidebook.  Patient learns how to identify when they are stressed. Video will discuss the impact of that stress, as well as the many benefits of stress management. Patient will also be introduced to stress management techniques. The way we think, act, and feel has an impact on our hearts.  How Our Thoughts Can Heal Our Hearts  Clinical staff conducted group or individual video education with verbal and written material and guidebook.  Patient learns that negative thoughts can cause depression and anxiety. This can result in negative lifestyle behavior and serious  health problems. Cognitive behavioral therapy is an effective method to help control our thoughts in order to change and improve our emotional outlook.  Additional Videos:  Exercise    Improving Performance  Clinical staff conducted group or individual video education with verbal and written material and guidebook.  Patient learns to use a non-linear approach by alternating intensity levels and lengths of time spent exercising to help burn more calories and lose more body fat. Cardiovascular exercise helps improve heart health, metabolism, hormonal balance, blood sugar control, and recovery from fatigue. Resistance training improves strength, endurance, balance, coordination, reaction time, metabolism, and muscle mass. Flexibility exercise improves circulation, posture, and balance. Seek guidance from your physician and exercise physiologist before implementing an exercise routine and learn your capabilities and proper form for all exercise.  Introduction to Yoga  Clinical staff conducted group or individual video education with verbal and written material and guidebook.  Patient learns about yoga, a discipline of the coming together of mind, breath, and body. The benefits of yoga include improved flexibility, improved range of motion, better posture and core strength, increased lung function, weight loss, and positive self-image. Yoga's heart health benefits include lowered blood pressure, healthier heart rate, decreased cholesterol and triglyceride levels, improved immune function, and reduced stress. Seek guidance from your physician and exercise physiologist before implementing an exercise routine and learn your capabilities and proper form for all exercise.  Medical   Aging: Enhancing Your Quality of Life  Clinical staff conducted group or individual video education with verbal and written material and guidebook.  Patient learns key strategies and recommendations to stay in good physical health  and enhance quality of life, such as prevention strategies, having an advocate, securing a Health Care Proxy and Power of Attorney, and keeping a list of medications and system for tracking them. It also discusses how to avoid risk for bone loss.  Biology of Weight Control  Clinical staff conducted group or individual video education with verbal and written material and guidebook.  Patient learns that weight gain occurs because we consume more calories than we burn (eating more, moving less). Even if your body weight is normal, you may have higher ratios of fat compared to muscle mass. Too much body fat puts you at increased risk for cardiovascular disease, heart attack, stroke, type 2 diabetes, and obesity-related cancers. In addition to exercise, following the Pritikin Eating Plan can help reduce your risk.  Decoding Lab Results  Clinical staff conducted group or individual video education with verbal and written material and guidebook.  Patient learns that lab test reflects one measurement whose values change over time and are influenced by many factors, including medication, stress, sleep, exercise, food, hydration, pre-existing medical  conditions, and more. It is recommended to use the knowledge from this video to become more involved with your lab results and evaluate your numbers to speak with your doctor.   Diseases of Our Time - Overview  Clinical staff conducted group or individual video education with verbal and written material and guidebook.  Patient learns that according to the CDC, 50% to 70% of chronic diseases (such as obesity, type 2 diabetes, elevated lipids, hypertension, and heart disease) are avoidable through lifestyle improvements including healthier food choices, listening to satiety cues, and increased physical activity.  Sleep Disorders Clinical staff conducted group or individual video education with verbal and written material and guidebook.  Patient learns how good  quality and duration of sleep are important to overall health and well-being. Patient also learns about sleep disorders and how they impact health along with recommendations to address them, including discussing with a physician.  Nutrition  Dining Out - Part 2 Clinical staff conducted group or individual video education with verbal and written material and guidebook.  Patient learns how to plan ahead and communicate in order to maximize their dining experience in a healthy and nutritious manner. Included are recommended food choices based on the type of restaurant the patient is visiting.   Fueling a Banker conducted group or individual video education with verbal and written material and guidebook.  There is a strong connection between our food choices and our health. Diseases like obesity and type 2 diabetes are very prevalent and are in large-part due to lifestyle choices. The Pritikin Eating Plan provides plenty of food and hunger-curbing satisfaction. It is easy to follow, affordable, and helps reduce health risks.  Menu Workshop  Clinical staff conducted group or individual video education with verbal and written material and guidebook.  Patient learns that restaurant meals can sabotage health goals because they are often packed with calories, fat, sodium, and sugar. Recommendations include strategies to plan ahead and to communicate with the manager, chef, or server to help order a healthier meal.  Planning Your Eating Strategy  Clinical staff conducted group or individual video education with verbal and written material and guidebook.  Patient learns about the Pritikin Eating Plan and its benefit of reducing the risk of disease. The Pritikin Eating Plan does not focus on calories. Instead, it emphasizes high-quality, nutrient-rich foods. By knowing the characteristics of the foods, we choose, we can determine their calorie density and make informed  decisions.  Targeting Your Nutrition Priorities  Clinical staff conducted group or individual video education with verbal and written material and guidebook.  Patient learns that lifestyle habits have a tremendous impact on disease risk and progression. This video provides eating and physical activity recommendations based on your personal health goals, such as reducing LDL cholesterol, losing weight, preventing or controlling type 2 diabetes, and reducing high blood pressure.  Vitamins and Minerals  Clinical staff conducted group or individual video education with verbal and written material and guidebook.  Patient learns different ways to obtain key vitamins and minerals, including through a recommended healthy diet. It is important to discuss all supplements you take with your doctor.   Healthy Mind-Set    Smoking Cessation  Clinical staff conducted group or individual video education with verbal and written material and guidebook.  Patient learns that cigarette smoking and tobacco addiction pose a serious health risk which affects millions of people. Stopping smoking will significantly reduce the risk of heart disease, lung disease, and many forms  of cancer. Recommended strategies for quitting are covered, including working with your doctor to develop a successful plan.  Culinary   Becoming a Set designer conducted group or individual video education with verbal and written material and guidebook.  Patient learns that cooking at home can be healthy, cost-effective, quick, and puts them in control. Keys to cooking healthy recipes will include looking at your recipe, assessing your equipment needs, planning ahead, making it simple, choosing cost-effective seasonal ingredients, and limiting the use of added fats, salts, and sugars.  Cooking - Breakfast and Snacks  Clinical staff conducted group or individual video education with verbal and written material and guidebook.   Patient learns how important breakfast is to satiety and nutrition through the entire day. Recommendations include key foods to eat during breakfast to help stabilize blood sugar levels and to prevent overeating at meals later in the day. Planning ahead is also a key component.  Cooking - Educational psychologist conducted group or individual video education with verbal and written material and guidebook.  Patient learns eating strategies to improve overall health, including an approach to cook more at home. Recommendations include thinking of animal protein as a side on your plate rather than center stage and focusing instead on lower calorie dense options like vegetables, fruits, whole grains, and plant-based proteins, such as beans. Making sauces in large quantities to freeze for later and leaving the skin on your vegetables are also recommended to maximize your experience.  Cooking - Healthy Salads and Dressing Clinical staff conducted group or individual video education with verbal and written material and guidebook.  Patient learns that vegetables, fruits, whole grains, and legumes are the foundations of the Pritikin Eating Plan. Recommendations include how to incorporate each of these in flavorful and healthy salads, and how to create homemade salad dressings. Proper handling of ingredients is also covered. Cooking - Soups and [employer] - Soups and Desserts Clinical staff conducted group or individual video education with verbal and written material and guidebook.  Patient learns that Pritikin soups and desserts make for easy, nutritious, and delicious snacks and meal components that are low in sodium, fat, sugar, and calorie density, while high in vitamins, minerals, and filling fiber. Recommendations include simple and healthy ideas for soups and desserts.   Overview     The Pritikin Solution Program Overview Clinical staff conducted group or individual video education with  verbal and written material and guidebook.  Patient learns that the results of the Pritikin Program have been documented in more than 100 articles published in peer-reviewed journals, and the benefits include reducing risk factors for (and, in some cases, even reversing) high cholesterol, high blood pressure, type 2 diabetes, obesity, and more! An overview of the three key pillars of the Pritikin Program will be covered: eating well, doing regular exercise, and having a healthy mind-set.  WORKSHOPS  Exercise: Exercise Basics: Building Your Action Plan Clinical staff led group instruction and group discussion with PowerPoint presentation and patient guidebook. To enhance the learning environment the use of posters, models and videos may be added. At the conclusion of this workshop, patients will comprehend the difference between physical activity and exercise, as well as the benefits of incorporating both, into their routine. Patients will understand the FITT (Frequency, Intensity, Time, and Type) principle and how to use it to build an exercise action plan. In addition, safety concerns and other considerations for exercise and cardiac rehab will be addressed  by the presenter. The purpose of this lesson is to promote a comprehensive and effective weekly exercise routine in order to improve patients' overall level of fitness.   Managing Heart Disease: Your Path to a Healthier Heart Clinical staff led group instruction and group discussion with PowerPoint presentation and patient guidebook. To enhance the learning environment the use of posters, models and videos may be added.At the conclusion of this workshop, patients will understand the anatomy and physiology of the heart. Additionally, they will understand how Pritikin's three pillars impact the risk factors, the progression, and the management of heart disease.  The purpose of this lesson is to provide a high-level overview of the heart, heart  disease, and how the Pritikin lifestyle positively impacts risk factors.  Exercise Biomechanics Clinical staff led group instruction and group discussion with PowerPoint presentation and patient guidebook. To enhance the learning environment the use of posters, models and videos may be added. Patients will learn how the structural parts of their bodies function and how these functions impact their daily activities, movement, and exercise. Patients will learn how to promote a neutral spine, learn how to manage pain, and identify ways to improve their physical movement in order to promote healthy living. The purpose of this lesson is to expose patients to common physical limitations that impact physical activity. Participants will learn practical ways to adapt and manage aches and pains, and to minimize their effect on regular exercise. Patients will learn how to maintain good posture while sitting, walking, and lifting.  Balance Training and Fall Prevention  Clinical staff led group instruction and group discussion with PowerPoint presentation and patient guidebook. To enhance the learning environment the use of posters, models and videos may be added. At the conclusion of this workshop, patients will understand the importance of their sensorimotor skills (vision, proprioception, and the vestibular system) in maintaining their ability to balance as they age. Patients will apply a variety of balancing exercises that are appropriate for their current level of function. Patients will understand the common causes for poor balance, possible solutions to these problems, and ways to modify their physical environment in order to minimize their fall risk. The purpose of this lesson is to teach patients about the importance of maintaining balance as they age and ways to minimize their risk of falling.  WORKSHOPS   Nutrition:  Fueling a Ship broker led group instruction and group  discussion with PowerPoint presentation and patient guidebook. To enhance the learning environment the use of posters, models and videos may be added. Patients will review the foundational principles of the Pritikin Eating Plan and understand what constitutes a serving size in each of the food groups. Patients will also learn Pritikin-friendly foods that are better choices when away from home and review make-ahead meal and snack options. Calorie density will be reviewed and applied to three nutrition priorities: weight maintenance, weight loss, and weight gain. The purpose of this lesson is to reinforce (in a group setting) the key concepts around what patients are recommended to eat and how to apply these guidelines when away from home by planning and selecting Pritikin-friendly options. Patients will understand how calorie density may be adjusted for different weight management goals.  Mindful Eating  Clinical staff led group instruction and group discussion with PowerPoint presentation and patient guidebook. To enhance the learning environment the use of posters, models and videos may be added. Patients will briefly review the concepts of the Pritikin Eating Plan and the importance  of low-calorie dense foods. The concept of mindful eating will be introduced as well as the importance of paying attention to internal hunger signals. Triggers for non-hunger eating and techniques for dealing with triggers will be explored. The purpose of this lesson is to provide patients with the opportunity to review the basic principles of the Pritikin Eating Plan, discuss the value of eating mindfully and how to measure internal cues of hunger and fullness using the Hunger Scale. Patients will also discuss reasons for non-hunger eating and learn strategies to use for controlling emotional eating.  Targeting Your Nutrition Priorities Clinical staff led group instruction and group discussion with PowerPoint presentation and  patient guidebook. To enhance the learning environment the use of posters, models and videos may be added. Patients will learn how to determine their genetic susceptibility to disease by reviewing their family history. Patients will gain insight into the importance of diet as part of an overall healthy lifestyle in mitigating the impact of genetics and other environmental insults. The purpose of this lesson is to provide patients with the opportunity to assess their personal nutrition priorities by looking at their family history, their own health history and current risk factors. Patients will also be able to discuss ways of prioritizing and modifying the Pritikin Eating Plan for their highest risk areas  Menu  Clinical staff led group instruction and group discussion with PowerPoint presentation and patient guidebook. To enhance the learning environment the use of posters, models and videos may be added. Using menus brought in from [employer], or printed from [employer], patients will apply the Pritikin dining out guidelines that were presented in the [employer] video. Patients will also be able to practice these guidelines in a variety of provided scenarios. The purpose of this lesson is to provide patients with the opportunity to practice hands-on learning of the Pritikin Dining Out guidelines with actual menus and practice scenarios.  Label Reading Clinical staff led group instruction and group discussion with PowerPoint presentation and patient guidebook. To enhance the learning environment the use of posters, models and videos may be added. Patients will review and discuss the Pritikin label reading guidelines presented in Pritikin's Label Reading Educational series video. Using fool labels brought in from local grocery stores and markets, patients will apply the label reading guidelines and determine if the packaged food meet the Pritikin guidelines. The purpose of this  lesson is to provide patients with the opportunity to review, discuss, and practice hands-on learning of the Pritikin Label Reading guidelines with actual packaged food labels. Cooking School  Pritikin's [employer] are designed to teach patients ways to prepare quick, simple, and affordable recipes at home. The importance of nutrition's role in chronic disease risk reduction is reflected in its emphasis in the overall Pritikin program. By learning how to prepare essential core Pritikin Eating Plan recipes, patients will increase control over what they eat; be able to customize the flavor of foods without the use of added salt, sugar, or fat; and improve the quality of the food they consume. By learning a set of core recipes which are easily assembled, quickly prepared, and affordable, patients are more likely to prepare more healthy foods at home. These workshops focus on convenient breakfasts, simple entres, side dishes, and desserts which can be prepared with minimal effort and are consistent with nutrition recommendations for cardiovascular risk reduction. Cooking [employer] are taught by a Armed forces logistics/support/administrative officer (RD) who has been trained by the  Pritikin Designer, industrial/product. The chef or RD has a clear understanding of the importance of minimizing - if not completely eliminating - added fat, sugar, and sodium in recipes. Throughout the series of Cooking School Workshop sessions, patients will learn about healthy ingredients and efficient methods of cooking to build confidence in their capability to prepare    Cooking School weekly topics:  Adding Flavor- Sodium-Free  Fast and Healthy Breakfasts  Powerhouse Plant-Based Proteins  Satisfying Salads and Dressings  Simple Sides and Sauces  International Cuisine-Spotlight on the [employer] Zones  Delicious Desserts  Savory Soups  [employer] - Meals in a Astronomer Appetizers and Snacks  Comforting Weekend Breakfasts  One-Pot  Wonders   Fast Evening Meals  Landscape architect Your Pritikin Plate  WORKSHOPS   Healthy Mindset (Psychosocial):  Focused Goals, Sustainable Changes Clinical staff led group instruction and group discussion with PowerPoint presentation and patient guidebook. To enhance the learning environment the use of posters, models and videos may be added. Patients will be able to apply effective goal setting strategies to establish at least one personal goal, and then take consistent, meaningful action toward that goal. They will learn to identify common barriers to achieving personal goals and develop strategies to overcome them. Patients will also gain an understanding of how our mind-set can impact our ability to achieve goals and the importance of cultivating a positive and growth-oriented mind-set. The purpose of this lesson is to provide patients with a deeper understanding of how to set and achieve personal goals, as well as the tools and strategies needed to overcome common obstacles which may arise along the way.  From Head to Heart: The Power of a Healthy Outlook  Clinical staff led group instruction and group discussion with PowerPoint presentation and patient guidebook. To enhance the learning environment the use of posters, models and videos may be added. Patients will be able to recognize and describe the impact of emotions and mood on physical health. They will discover the importance of self-care and explore self-care practices which may work for them. Patients will also learn how to utilize the 4 C's to cultivate a healthier outlook and better manage stress and challenges. The purpose of this lesson is to demonstrate to patients how a healthy outlook is an essential part of maintaining good health, especially as they continue their cardiac rehab journey.  Healthy Sleep for a Healthy Heart Clinical staff led group instruction and group discussion with PowerPoint presentation and  patient guidebook. To enhance the learning environment the use of posters, models and videos may be added. At the conclusion of this workshop, patients will be able to demonstrate knowledge of the importance of sleep to overall health, well-being, and quality of life. They will understand the symptoms of, and treatments for, common sleep disorders. Patients will also be able to identify daytime and nighttime behaviors which impact sleep, and they will be able to apply these tools to help manage sleep-related challenges. The purpose of this lesson is to provide patients with a general overview of sleep and outline the importance of quality sleep. Patients will learn about a few of the most common sleep disorders. Patients will also be introduced to the concept of "sleep hygiene," and discover ways to self-manage certain sleeping problems through simple daily behavior changes. Finally, the workshop will motivate patients by clarifying the links between quality sleep and their goals of heart-healthy living.   Recognizing and Reducing Stress Clinical staff led group instruction  and group discussion with PowerPoint presentation and patient guidebook. To enhance the learning environment the use of posters, models and videos may be added. At the conclusion of this workshop, patients will be able to understand the types of stress reactions, differentiate between acute and chronic stress, and recognize the impact that chronic stress has on their health. They will also be able to apply different coping mechanisms, such as reframing negative self-talk. Patients will have the opportunity to practice a variety of stress management techniques, such as deep abdominal breathing, progressive muscle relaxation, and/or guided imagery.  The purpose of this lesson is to educate patients on the role of stress in their lives and to provide healthy techniques for coping with it.  Learning Barriers/Preferences:  Learning  Barriers/Preferences - 07/16/23 1047       Learning Barriers/Preferences   Learning Barriers Hearing;Sight   Wears readinhg glasses, bilateral hearing aids   Learning Preferences Computer/Internet;Video;Written Material;Pictoral          Education Topics:  Knowledge Questionnaire Score:  Knowledge Questionnaire Score - 07/16/23 1208       Knowledge Questionnaire Score   Pre Score 20/24          Core Components/Risk Factors/Patient Goals at Admission:  Personal Goals and Risk Factors at Admission - 07/16/23 1049       Core Components/Risk Factors/Patient Goals on Admission    Weight Management Yes;Obesity;Weight Loss    Intervention Weight Management: Develop a combined nutrition and exercise program designed to reach desired caloric intake, while maintaining appropriate intake of nutrient and fiber, sodium and fats, and appropriate energy expenditure required for the weight goal.;Weight Management: Provide education and appropriate resources to help participant work on and attain dietary goals.;Weight Management/Obesity: Establish reasonable short term and long term weight goals.;Obesity: Provide education and appropriate resources to help participant work on and attain dietary goals.    Admit Weight 228 lb 9.9 oz (103.7 kg)    Goal Weight: Short Term 216 lb (98 kg)    Expected Outcomes Short Term: Continue to assess and modify interventions until short term weight is achieved;Long Term: Adherence to nutrition and physical activity/exercise program aimed toward attainment of established weight goal;Weight Loss: Understanding of general recommendations for a balanced deficit meal plan, which promotes 1-2 lb weight loss per week and includes a negative energy balance of [phone] kcal/d;Understanding recommendations for meals to include 15-35% energy as protein, 25-35% energy from fat, 35-60% energy from carbohydrates, less than 200mg  of dietary cholesterol, 20-35 gm of total fiber  daily;Understanding of distribution of calorie intake throughout the day with the consumption of 4-5 meals/snacks    Hypertension Yes    Intervention Provide education on lifestyle modifcations including regular physical activity/exercise, weight management, moderate sodium restriction and increased consumption of fresh fruit, vegetables, and low fat dairy, alcohol moderation, and smoking cessation.;Monitor prescription use compliance.    Expected Outcomes Short Term: Continued assessment and intervention until BP is < 140/55mm HG in hypertensive participants. < 130/15mm HG in hypertensive participants with diabetes, heart failure or chronic kidney disease.;Long Term: Maintenance of blood pressure at goal levels.    Lipids Yes    Intervention Provide education and support for participant on nutrition & aerobic/resistive exercise along with prescribed medications to achieve LDL 70mg , HDL >40mg .    Expected Outcomes Short Term: Participant states understanding of desired cholesterol values and is compliant with medications prescribed. Participant is following exercise prescription and nutrition guidelines.;Long Term: Cholesterol controlled with medications as prescribed, with individualized exercise  RX and with personalized nutrition plan. Value goals: LDL < 70mg , HDL > 40 mg.          Core Components/Risk Factors/Patient Goals Review:   Goals and Risk Factor Review     Row Name 07/23/23 1002 08/04/23 1614 09/01/23 1651 09/23/23 0800       Core Components/Risk Factors/Patient Goals Review   Personal Goals Review Weight Management/Obesity;Hypertension;Lipids Weight Management/Obesity;Hypertension;Lipids Weight Management/Obesity;Hypertension;Lipids Weight Management/Obesity;Hypertension;Lipids    Review Cassandra [name] started cardiac rehab on 07/22/23. Cassandra [name] did well with exercise. Vital signs were stable Cassandra [name] started cardiac rehab on 07/22/23. Exercise is currently on hold due to migraine. Cassandra [name] has  an appointment with her neurologist on 08/19/23 await clearance to return to exercise. Cassandra [name] has returned to cardiac rehab per neurology. Cassandra [name] has increased her workloads slightly. Vital signs have been stable. Cassandra [name] has lost 1.3 kg since starting cardiac rehab Cassandra [name] is doing well with exercise at cardiac rehab. Cassandra [name] has increased her workloads . Vital signs have been stable. Cassandra [name] has lost 2.0 kg since starting cardiac rehab    Expected Outcomes Cassandra [name] will continnue to participate in cardiac rehab for exercise, nutrition and lifestyle modifications Cassandra [name] will continnue to participate in cardiac rehab for exercise, nutrition and lifestyle modifications Cassandra [name] will continnue to participate in cardiac rehab for exercise, nutrition and lifestyle modifications Cassandra [name] will continnue to participate in cardiac rehab for exercise, nutrition and lifestyle modifications       Core Components/Risk Factors/Patient Goals at Discharge (Final Review):   Goals and Risk Factor Review - 09/23/23 0800       Core Components/Risk Factors/Patient Goals Review   Personal Goals Review Weight Management/Obesity;Hypertension;Lipids    Review Cassandra [name] is doing well with exercise at cardiac rehab. Cassandra [name] has increased her workloads . Vital signs have been stable. Cassandra [name] has lost 2.0 kg since starting cardiac rehab    Expected Outcomes Cassandra [name] will continnue to participate in cardiac rehab for exercise, nutrition and lifestyle modifications          ITP Comments:  ITP Comments     Row Name 07/16/23 1033 07/23/23 0958 08/04/23 1612 09/01/23 1646 09/23/23 0755   ITP Comments Wilbert Bihari, MD:  Introduction to the Pritikin Education Program / Intensive Cardiac Rehab.  Initial orientation packet reviewed with the patient. 30 Day ITP Review. Ayana started cardiac rehab on 07/22/23. Jaree did well with exercise. 30 Day ITP Review. Mystique's exercise at cardiology is currently on hold due to ED visit with migraine await  neurology clearance for the patient to resume exercise. 30 Day ITP Review. Dimitra has returned to exercise at cardiac rehab and is exercising without difficulty 30 Day ITP Review. Cassandra [name] has good attendance and participation with exercise at cardiac rehab      Comments: See ITP comments.Hadassah Elpidio Quan RN BSN

## 2023-09-30 ENCOUNTER — Encounter (HOSPITAL_COMMUNITY)

## 2023-09-30 ENCOUNTER — Encounter (HOSPITAL_COMMUNITY)
Admission: RE | Admit: 2023-09-30 | Discharge: 2023-09-30 | Disposition: A | Discharge status: Home / Self Care | Source: Ambulatory Visit | Attending: Cardiovascular Disease | Admitting: Cardiovascular Disease

## 2023-09-30 DIAGNOSIS — Z952 Presence of prosthetic heart valve: Secondary | ICD-10-CM | POA: Diagnosis not present

## 2023-10-02 ENCOUNTER — Encounter (HOSPITAL_COMMUNITY)
Admission: RE | Admit: 2023-10-02 | Discharge: 2023-10-02 | Disposition: A | Discharge status: Home / Self Care | Source: Ambulatory Visit | Attending: Cardiovascular Disease | Admitting: Cardiovascular Disease

## 2023-10-02 ENCOUNTER — Encounter (HOSPITAL_COMMUNITY)

## 2023-10-02 DIAGNOSIS — Z952 Presence of prosthetic heart valve: Secondary | ICD-10-CM | POA: Diagnosis not present

## 2023-10-05 ENCOUNTER — Encounter (HOSPITAL_COMMUNITY)

## 2023-10-05 ENCOUNTER — Encounter (HOSPITAL_COMMUNITY)
Admission: RE | Admit: 2023-10-05 | Discharge: 2023-10-05 | Disposition: A | Discharge status: Home / Self Care | Source: Ambulatory Visit | Attending: Cardiovascular Disease | Admitting: Cardiovascular Disease

## 2023-10-05 DIAGNOSIS — Z952 Presence of prosthetic heart valve: Secondary | ICD-10-CM

## 2023-10-06 ENCOUNTER — Telehealth (HOSPITAL_COMMUNITY): Payer: Self-pay

## 2023-10-06 NOTE — Telephone Encounter (Signed)
 Detailed instructions left on the patient's answering machine. Asked to call back with any questions.  S.Alessa Mazur CCT

## 2023-10-07 ENCOUNTER — Encounter (HOSPITAL_COMMUNITY)

## 2023-10-07 ENCOUNTER — Encounter (HOSPITAL_COMMUNITY)
Admission: RE | Admit: 2023-10-07 | Discharge: 2023-10-07 | Disposition: A | Discharge status: Home / Self Care | Source: Ambulatory Visit | Attending: Cardiovascular Disease | Admitting: Cardiovascular Disease

## 2023-10-07 DIAGNOSIS — Z952 Presence of prosthetic heart valve: Secondary | ICD-10-CM | POA: Diagnosis not present

## 2023-10-09 ENCOUNTER — Encounter (HOSPITAL_COMMUNITY)
Admission: RE | Admit: 2023-10-09 | Discharge: 2023-10-09 | Disposition: A | Discharge status: Home / Self Care | Source: Ambulatory Visit | Attending: Cardiovascular Disease | Admitting: Cardiovascular Disease

## 2023-10-09 ENCOUNTER — Other Ambulatory Visit: Payer: Self-pay | Admitting: Physician Assistant

## 2023-10-09 DIAGNOSIS — I350 Nonrheumatic aortic (valve) stenosis: Secondary | ICD-10-CM

## 2023-10-09 DIAGNOSIS — Z952 Presence of prosthetic heart valve: Secondary | ICD-10-CM

## 2023-10-09 DIAGNOSIS — G5603 Carpal tunnel syndrome, bilateral upper limbs: Secondary | ICD-10-CM

## 2023-10-09 DIAGNOSIS — I5032 Chronic diastolic (congestive) heart failure: Secondary | ICD-10-CM

## 2023-10-12 ENCOUNTER — Encounter (HOSPITAL_COMMUNITY)
Admission: RE | Admit: 2023-10-12 | Discharge: 2023-10-12 | Disposition: A | Discharge status: Home / Self Care | Source: Ambulatory Visit | Attending: Cardiovascular Disease | Admitting: Cardiovascular Disease

## 2023-10-12 ENCOUNTER — Other Ambulatory Visit (HOSPITAL_COMMUNITY): Payer: 59

## 2023-10-12 DIAGNOSIS — Z952 Presence of prosthetic heart valve: Secondary | ICD-10-CM | POA: Diagnosis not present

## 2023-10-13 ENCOUNTER — Ambulatory Visit (HOSPITAL_COMMUNITY)
Admission: RE | Admit: 2023-10-13 | Source: Ambulatory Visit | Attending: Physician Assistant | Admitting: Physician Assistant

## 2023-10-13 ENCOUNTER — Telehealth (HOSPITAL_COMMUNITY): Payer: Self-pay

## 2023-10-13 NOTE — Telephone Encounter (Signed)
 Detailed instructions left on the patient's answering machine. Cassandra [name] CCT

## 2023-10-14 ENCOUNTER — Encounter (HOSPITAL_COMMUNITY)
Admission: RE | Admit: 2023-10-14 | Discharge: 2023-10-14 | Disposition: A | Discharge status: Home / Self Care | Source: Ambulatory Visit | Attending: Cardiovascular Disease | Admitting: Cardiovascular Disease

## 2023-10-14 DIAGNOSIS — Z952 Presence of prosthetic heart valve: Secondary | ICD-10-CM

## 2023-10-16 ENCOUNTER — Encounter (HOSPITAL_COMMUNITY)
Admission: RE | Admit: 2023-10-16 | Discharge: 2023-10-16 | Disposition: A | Discharge status: Home / Self Care | Source: Ambulatory Visit | Attending: Cardiovascular Disease | Admitting: Cardiovascular Disease

## 2023-10-16 VITALS — Ht 64.0 in | Wt 224.6 lb

## 2023-10-16 DIAGNOSIS — Z952 Presence of prosthetic heart valve: Secondary | ICD-10-CM | POA: Insufficient documentation

## 2023-10-16 DIAGNOSIS — Z48812 Encounter for surgical aftercare following surgery on the circulatory system: Secondary | ICD-10-CM | POA: Insufficient documentation

## 2023-10-19 ENCOUNTER — Encounter (HOSPITAL_COMMUNITY)
Admission: RE | Admit: 2023-10-19 | Discharge: 2023-10-19 | Disposition: A | Discharge status: Home / Self Care | Source: Ambulatory Visit | Attending: Cardiovascular Disease

## 2023-10-19 DIAGNOSIS — Z952 Presence of prosthetic heart valve: Secondary | ICD-10-CM

## 2023-10-19 DIAGNOSIS — Z48812 Encounter for surgical aftercare following surgery on the circulatory system: Secondary | ICD-10-CM | POA: Diagnosis not present

## 2023-10-19 NOTE — Progress Notes (Signed)
 Discharge Progress Report  Patient Details  Name: Cassandra [name] MRN: [id number] Date of Birth: September 29, 1955 Referring Provider:   Flowsheet Row INTENSIVE CARDIAC REHAB ORIENT from 07/16/2023 in [facility]  Referring Provider Manus Passe, MD     Number of Visits: 50  Reason for Discharge:  Patient reached a stable level of exercise. Patient independent in their exercise. Patient has met program and personal goals.  Smoking History:  Social History   Tobacco Use  Smoking Status Never   Passive exposure: Never  Smokeless Tobacco Never    Diagnosis:  06/02/23 S/P TAVR (transcatheter aortic valve replacement)  ADL UCSD:   Initial Exercise Prescription:  Initial Exercise Prescription - 07/16/23 1000       Date of Initial Exercise RX and Referring Provider   Date 07/16/23    Referring Provider Manus Passe, MD    Expected Discharge Date 10/07/23      Recumbant Bike   Level 1    RPM 60    Watts 25    Minutes 15    METs 2      NuStep   Level 1    SPM 75    Minutes 15    METs 2      Prescription Details   Frequency (times per week) 3    Duration Progress to 30 minutes of continuous aerobic without signs/symptoms of physical distress      Intensity   THRR 40-80% of Max Heartrate 61-122    Ratings of Perceived Exertion 11-13    Perceived Dyspnea 0-4      Progression   Progression Continue progressive overload as per policy without signs/symptoms or physical distress.      Resistance Training   Training Prescription Yes    Weight 2 lbs    Reps 10-15          Discharge Exercise Prescription (Final Exercise Prescription Changes):  Exercise Prescription Changes - 10/09/23 1400       Response to Exercise   Blood Pressure (Admit) 136/74    Blood Pressure (Exit) 118/62    Heart Rate (Admit) 73 bpm    Heart Rate (Exercise) 139 bpm    Heart Rate (Exit) 82 bpm    Rating of Perceived Exertion  (Exercise) 13    Symptoms None    Comments Reviewed METs and goals    Duration Continue with 30 min of aerobic exercise without signs/symptoms of physical distress.    Intensity THRR unchanged      Progression   Progression Continue to progress workloads to maintain intensity without signs/symptoms of physical distress.    Average METs 3.15      Resistance Training   Training Prescription Yes    Weight 2 lbs      Interval Training   Interval Training No      Recumbant Bike   Level 6    RPM 36    Watts 46    Minutes 15    METs 3.5      NuStep   Level 7    SPM 70    Minutes 15    METs 2.8      Home Exercise Plan   Plans to continue exercise at Home (comment)    Frequency Add 2 additional days to program exercise sessions.    Initial Home Exercises Provided 09/04/23          Functional Capacity:  6 Minute Walk  Row Name 07/16/23 1203 10/16/23 1304       6 Minute Walk   Phase Initial Discharge    Distance 908 feet 1253 feet    Distance % Change -- 38 %    Distance Feet Change -- 345 ft    Walk Time 6 minutes 6 minutes    # of Rest Breaks 1  2:25-3:40 ( pt made to stop, over THR) 0    MPH 1.72 2.4    METS 2 2.75    RPE 14 10    Perceived Dyspnea  2 0    VO2 Peak 7.1 9.62    Symptoms Yes (comment) No    Comments SOB, PRD = 2, 6/10 bilateral foot pain --    Resting HR 63 bpm 73 bpm    Resting BP 120/80 130/74    Resting Oxygen Saturation  97 % --    Exercise Oxygen Saturation  during 6 min walk 96 % --    Max Ex. HR 130 bpm 140 bpm    Max Ex. BP 140/76 138/76    2 Minute Post BP 126/76 --       Psychological, QOL, Others - Outcomes: PHQ 2/9:    10/19/2023    1:16 PM 07/16/2023   10:40 AM  Depression screen PHQ 2/9  Decreased Interest 0 0  Down, Depressed, Hopeless 0 0  PHQ - 2 Score 0 0  Altered sleeping 0 0  Tired, decreased energy 0 0  Change in appetite 0 0  Feeling bad or failure about yourself  0   Trouble concentrating 0 2  Moving  slowly or fidgety/restless 0 0  Suicidal thoughts 0 0  PHQ-9 Score 0 2  Difficult doing work/chores Not difficult at all Not difficult at all    Quality of Life:  Quality of Life - 10/15/23 0926       Quality of Life   Select Quality of Life      Quality of Life Scores   Health/Function Pre 22.69 %    Health/Function Post 25.93 %    Health/Function % Change 14.28 %    Socioeconomic Pre 23.5 %    Socioeconomic Post 24.94 %    Socioeconomic % Change  6.13 %    Psych/Spiritual Pre 26.57 %    Psych/Spiritual Post 24.79 %    Psych/Spiritual % Change -6.7 %    Family Pre 25.5 %    Family Post 25.4 %    Family % Change -0.39 %    GLOBAL Pre 24.13 %    GLOBAL Post 25.4 %    GLOBAL % Change 5.26 %          Personal Goals: Goals established at orientation with interventions provided to work toward goal.  Personal Goals and Risk Factors at Admission - 07/16/23 1049       Core Components/Risk Factors/Patient Goals on Admission    Weight Management Yes;Obesity;Weight Loss    Intervention Weight Management: Develop a combined nutrition and exercise program designed to reach desired caloric intake, while maintaining appropriate intake of nutrient and fiber, sodium and fats, and appropriate energy expenditure required for the weight goal.;Weight Management: Provide education and appropriate resources to help participant work on and attain dietary goals.;Weight Management/Obesity: Establish reasonable short term and long term weight goals.;Obesity: Provide education and appropriate resources to help participant work on and attain dietary goals.    Admit Weight 228 lb 9.9 oz (103.7 kg)    Goal Weight: Short Term  216 lb (98 kg)    Expected Outcomes Short Term: Continue to assess and modify interventions until short term weight is achieved;Long Term: Adherence to nutrition and physical activity/exercise program aimed toward attainment of established weight goal;Weight Loss: Understanding of  general recommendations for a balanced deficit meal plan, which promotes 1-2 lb weight loss per week and includes a negative energy balance of [phone] kcal/d;Understanding recommendations for meals to include 15-35% energy as protein, 25-35% energy from fat, 35-60% energy from carbohydrates, less than 200mg  of dietary cholesterol, 20-35 gm of total fiber daily;Understanding of distribution of calorie intake throughout the day with the consumption of 4-5 meals/snacks    Hypertension Yes    Intervention Provide education on lifestyle modifcations including regular physical activity/exercise, weight management, moderate sodium restriction and increased consumption of fresh fruit, vegetables, and low fat dairy, alcohol moderation, and smoking cessation.;Monitor prescription use compliance.    Expected Outcomes Short Term: Continued assessment and intervention until BP is < 140/28mm HG in hypertensive participants. < 130/48mm HG in hypertensive participants with diabetes, heart failure or chronic kidney disease.;Long Term: Maintenance of blood pressure at goal levels.    Lipids Yes    Intervention Provide education and support for participant on nutrition & aerobic/resistive exercise along with prescribed medications to achieve LDL 70mg , HDL >40mg .    Expected Outcomes Short Term: Participant states understanding of desired cholesterol values and is compliant with medications prescribed. Participant is following exercise prescription and nutrition guidelines.;Long Term: Cholesterol controlled with medications as prescribed, with individualized exercise RX and with personalized nutrition plan. Value goals: LDL < 70mg , HDL > 40 mg.           Personal Goals Discharge:  Goals and Risk Factor Review     Row Name 07/23/23 1002 08/04/23 1614 09/01/23 1651 09/23/23 0800       Core Components/Risk Factors/Patient Goals Review   Personal Goals Review Weight Management/Obesity;Hypertension;Lipids Weight  Management/Obesity;Hypertension;Lipids Weight Management/Obesity;Hypertension;Lipids Weight Management/Obesity;Hypertension;Lipids    Review Damita started cardiac rehab on 07/22/23. Seleni did well with exercise. Vital signs were stable Marium started cardiac rehab on 07/22/23. Exercise is currently on hold due to migraine. Insiya has an appointment with her neurologist on 08/19/23 await clearance to return to exercise. Falynn has returned to cardiac rehab per neurology. Estle has increased her workloads slightly. Vital signs have been stable. Janina has lost 1.3 kg since starting cardiac rehab Lile is doing well with exercise at cardiac rehab. Artemisa has increased her workloads . Vital signs have been stable. Breauna has lost 2.0 kg since starting cardiac rehab    Expected Outcomes Kenitha will continnue to participate in cardiac rehab for exercise, nutrition and lifestyle modifications Kellsey will continnue to participate in cardiac rehab for exercise, nutrition and lifestyle modifications Maja will continnue to participate in cardiac rehab for exercise, nutrition and lifestyle modifications Aprile will continnue to participate in cardiac rehab for exercise, nutrition and lifestyle modifications       Exercise Goals and Review:  Exercise Goals     Row Name 07/16/23 1046             Exercise Goals   Increase Physical Activity Yes       Intervention Provide advice, education, support and counseling about physical activity/exercise needs.;Develop an individualized exercise prescription for aerobic and resistive training based on initial evaluation findings, risk stratification, comorbidities and participant's personal goals.       Expected Outcomes Short Term: Attend rehab on a regular basis to increase amount of  physical activity.;Long Term: Exercising regularly at least 3-5 days a week.;Long Term: Add in home exercise to make exercise part of routine and to increase amount of physical  activity.       Increase Strength and Stamina Yes       Intervention Provide advice, education, support and counseling about physical activity/exercise needs.;Develop an individualized exercise prescription for aerobic and resistive training based on initial evaluation findings, risk stratification, comorbidities and participant's personal goals.       Expected Outcomes Short Term: Increase workloads from initial exercise prescription for resistance, speed, and METs.;Short Term: Perform resistance training exercises routinely during rehab and add in resistance training at home;Long Term: Improve cardiorespiratory fitness, muscular endurance and strength as measured by increased METs and functional capacity ( )       Able to understand and use rate of perceived exertion (RPE) scale Yes       Intervention Provide education and explanation on how to use RPE scale       Expected Outcomes Short Term: Able to use RPE daily in rehab to express subjective intensity level;Long Term:  Able to use RPE to guide intensity level when exercising independently       Knowledge and understanding of Target Heart Rate Range (THRR) Yes       Intervention Provide education and explanation of THRR including how the numbers were predicted and where they are located for reference       Expected Outcomes Short Term: Able to state/look up THRR;Long Term: Able to use THRR to govern intensity when exercising independently;Short Term: Able to use daily as guideline for intensity in rehab       Understanding of Exercise Prescription Yes       Intervention Provide education, explanation, and written materials on patient's individual exercise prescription       Expected Outcomes Short Term: Able to explain program exercise prescription;Long Term: Able to explain home exercise prescription to exercise independently          Exercise Goals Re-Evaluation:  Exercise Goals Re-Evaluation     Row Name 07/22/23 1624 09/02/23 1402  10/09/23 1615         Exercise Goal Re-Evaluation   Exercise Goals Review Increase Physical Activity;Increase Strength and Stamina;Able to understand and use rate of perceived exertion (RPE) scale;Knowledge and understanding of Target Heart Rate Range (THRR);Understanding of Exercise Prescription Increase Physical Activity;Increase Strength and Stamina;Able to understand and use rate of perceived exertion (RPE) scale;Knowledge and understanding of Target Heart Rate Range (THRR);Understanding of Exercise Prescription Increase Physical Activity;Increase Strength and Stamina;Able to understand and use rate of perceived exertion (RPE) scale;Knowledge and understanding of Target Heart Rate Range (THRR);Understanding of Exercise Prescription     Comments Pt's first day in the [facility] program. Pt understands the exercise Rx, RPE scale and THRR. Reviewed METs and goals. Pt voices progress on her goals. Pt has lost 3.1 lbs. Pt voices motivation to improve her diet and reduce Na. Pt voices impovement in her flexibilty and endurance, both goals. Reviewed METs and goals. Pt continues to voice progress on her goals. Pt has lost 6.4 lbs. Pt voices she is now cooking healthy meals to improve her diet and reduce Na. Pt voices impovement in her flexibilty and endurance, both goals. Pt exercise also at [employer].     Expected Outcomes Will continue to monitor patient and progress exercise workloads as tolerated. Will continue to monitor patient and progress exercise workloads as tolerated. Will continue to monitor patient and  progress exercise workloads as tolerated.        Nutrition & Weight - Outcomes:  Pre Biometrics - 07/16/23 1015       Pre Biometrics   Waist Circumference 51 inches    Hip Circumference 53 inches    Waist to Hip Ratio 0.96 %    Triceps Skinfold 44 mm    % Body Fat 53.4 %    Grip Strength 20 kg    Flexibility 10 in    Single Leg Stand 1 seconds          Post Biometrics - 10/16/23  1315        Post  Biometrics   Height 5' 4 (1.626 m)    Weight 101.9 kg    Waist Circumference 50.5 inches    Hip Circumference 52.25 inches    Waist to Hip Ratio 0.97 %    BMI (Calculated) 38.54    Triceps Skinfold 38 mm    % Body Fat 52.1 %    Grip Strength 14 kg   diffrent equipment   Flexibility 10 in    Single Leg Stand 1 seconds          Nutrition:  Nutrition Therapy & Goals - 09/21/23 1451       Nutrition Therapy   Diet Heart healthy diet    Drug/Food Interactions Statins/Certain Fruits      Personal Nutrition Goals   Nutrition Goal Patient to identify strategies for reducing cardiovascular risk by attending the [employer] education and nutrition series weekly.   goal in progress.   Personal Goal #2 Patient to improve diet quality by using the plate method as a guide for meal planning to include lean protein/plant protein, fruits, vegetables, whole grains, nonfat dairy as part of a well-balanced diet.   goal in progress.   Personal Goal #3 Patient to identify strategies for weight loss of 0.5-2.0# per week.   goal in progress.   Comments Goals in progress. 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 She continues to attend the [facility] education and nutrition series regularly. She reports improved mood and motivation since changing anti-depressant. Lipids are WNL. A1c is in a prediabetic range. Blood pressure remains stable. Patient is down 4.4# since orientation to our program. Patient will benefit from participation in intensive cardiac rehab for nutrition, exercise, and lifestyle modification.      Intervention Plan   Intervention Prescribe, educate and counsel regarding individualized specific dietary modifications aiming towards targeted core components  such as weight, hypertension, lipid management, diabetes, heart failure and other comorbidities.;Nutrition handout(s) given to patient.    Expected Outcomes Short Term Goal: Understand basic principles of dietary content, such as calories, fat, sodium, cholesterol and nutrients.;Long Term Goal: Adherence to prescribed nutrition plan.          Nutrition Discharge:  Nutrition Assessments - 07/23/23 1510       Rate Your Plate Scores   Pre Score 54          Education Questionnaire Score:  Knowledge Questionnaire Score - 10/15/23 0927       Knowledge Questionnaire Score   Post Score 21/24          Goals reviewed with patient; copy given to patient.Pt graduated from  Intensive/Traditional cardiac rehab program on 10/19/23  with completion of  25 exercise and  25  education sessions. Pt maintained good attendance and progressed nicely during her participation in rehab as evidenced by increased MET level.Callie increased her distance on her post exercise walk test by 345 feet and lost 2.1 kg while enrolled,  Medication list reconciled. Repeat  PHQ score- 2 .  Pt has made significant lifestyle changes and should be commended for her success. Almira  achieved their goals during cardiac rehab.   Pt plans to continue exercise at [employer]. We are proud of Merline's progress and weight loss.  Hadassah Elpidio Quan RN BSN

## 2023-10-20 ENCOUNTER — Ambulatory Visit (HOSPITAL_COMMUNITY)
Admission: RE | Admit: 2023-10-20 | Discharge: 2023-10-20 | Disposition: A | Discharge status: Home / Self Care | Source: Ambulatory Visit | Attending: Cardiology | Admitting: Cardiology

## 2023-10-20 DIAGNOSIS — I350 Nonrheumatic aortic (valve) stenosis: Secondary | ICD-10-CM | POA: Diagnosis not present

## 2023-10-20 DIAGNOSIS — I5032 Chronic diastolic (congestive) heart failure: Secondary | ICD-10-CM

## 2023-10-20 DIAGNOSIS — G5603 Carpal tunnel syndrome, bilateral upper limbs: Secondary | ICD-10-CM | POA: Diagnosis not present

## 2023-10-20 MED ORDER — TECHNETIUM TC 99M PYROPHOSPHATE
20.6000 | Freq: Once | INTRAVENOUS | Status: AC
Start: 2023-10-20 — End: 2023-10-20
  Administered 2023-10-20: 20.6 via INTRAVENOUS

## 2023-10-21 ENCOUNTER — Ambulatory Visit: Payer: Self-pay | Admitting: Physician Assistant

## 2023-10-21 ENCOUNTER — Encounter (HOSPITAL_COMMUNITY)
Admission: RE | Admit: 2023-10-21 | Discharge: 2023-10-21 | Disposition: A | Discharge status: Home / Self Care | Source: Ambulatory Visit | Attending: Cardiovascular Disease | Admitting: Cardiovascular Disease

## 2023-10-21 DIAGNOSIS — Z48812 Encounter for surgical aftercare following surgery on the circulatory system: Secondary | ICD-10-CM | POA: Diagnosis not present

## 2023-10-21 DIAGNOSIS — Z952 Presence of prosthetic heart valve: Secondary | ICD-10-CM

## 2023-10-21 LAB — MYOCARDIAL AMYLOID PLANAR & SPECT: H/CL Ratio: 1.07

## 2023-10-23 ENCOUNTER — Encounter (HOSPITAL_COMMUNITY)

## 2023-10-28 ENCOUNTER — Telehealth: Payer: Self-pay

## 2023-10-28 NOTE — Telephone Encounter (Signed)
 Spoke about [employer] and she is interested in joining the [place] class on September 8. Will call again to schedule initial assessment.

## 2023-11-11 ENCOUNTER — Telehealth: Payer: Self-pay

## 2023-11-11 NOTE — Telephone Encounter (Signed)
 Scheduled the initial assessment on Wednesday Sept 3 at 11:00 for the PREP program at [place] which is starting September 8th.

## 2023-11-18 NOTE — Progress Notes (Signed)
 [facility] Evaluation  Patient Details  Name: Cassandra [name] MRN: [id number] Date of Birth: [birth year] Age: 68 y.o. PCP: Franchot Houston, PA-C  Vitals:   11/18/23 1350  BP: 117/74  Pulse: 84  SpO2: 93%  Weight: 220 lb 9.6 oz (100.1 kg)     [facility] Eval - 11/18/23 1300       [facility] PREP Location   [facility] PREP Location [facility]      Referral    Referring Provider McAlhany    Reason for referral Hypertension;Heart Failure    Program Start Date 11/23/23      Measurement   Waist Circumference 51 inches    Hip Circumference 50.75 inches    Body fat 49.8 percent      Information for Trainer   Goals Lose weight    Current Exercise Walking, silver sneakers style classes    Orthopedic Concerns Left hand, not cartlidge    Pertinent Medical History Migraines, hypertension    Current Barriers Migraines      Mobility and Daily Activities   I find it easy to walk up or down two or more flights of stairs. 1    I have no trouble taking out the trash. 1    I do housework such as vacuuming and dusting on my own without difficulty. 4    I can easily lift a gallon of milk (8lbs). 4    I can easily walk a mile. 1    I have no trouble reaching into high cupboards or reaching down to pick up something from the floor. 1    I do not have trouble doing out-door work such as Loss adjuster, chartered, raking leaves, or gardening. 2      Mobility and Daily Activities   I feel younger than my age. 2    I feel independent. 4    I feel energetic. 2    I live an active life.  2    I feel strong. 1    I feel healthy. 4    I feel active as other people my age. 4      How fit and strong are you.   Fit and Strong Total Score 33         Past Medical History:  Diagnosis Date   Allergy    Anxiety    Complication of anesthesia    difficulty waking up   Depression    GERD (gastroesophageal reflux disease)    Hypertension    Leukocytoclastic vasculitis (HCC)    Dr. Geronimo and Dr.  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 Verlin & Dr. Maryjane   Severe aortic stenosis    Sleep apnea    cpap   SVT (supraventricular tachycardia) (HCC)    Type 2 diabetes mellitus with kidney complication, without long-term current use of insulin  (HCC) 05/07/2016   Verrucae vulgaris    Wegner's disease (congenital syphilitic osteochondritis)    Past Surgical History:  Procedure Laterality Date   BUNIONECTOMY Bilateral    CARPAL TUNNEL RELEASE Right 2002   INTRAOPERATIVE TRANSTHORACIC ECHOCARDIOGRAM N/A 06/02/2023   Procedure: ECHOCARDIOGRAM, TRANSTHORACIC;  Surgeon: Verlin Lonni BIRCH, MD;  Location: [facility] INVASIVE CV LAB;  Service: Cardiovascular;  Laterality: N/A;   JOINT REPLACEMENT Right    knee  LOWER EXTREMITY VENOGRAPHY Right 07/14/2016   Procedure: Lower Extremity Venography;  Surgeon: Penne Lonni Colorado, MD;  Location: [facility] INVASIVE CV LAB;  Service: Cardiovascular;  Laterality: Right;   PERCUTANEOUS VENOUS THROMBECTOMY,LYSIS WITH INTRAVASCULAR ULTRASOUND (IVUS) Right 07/17/2016   Procedure: PERCUTANEOUS VENOUS THROMBECTOMY AND LYSIS WITH INTRAVASCULAR ULTRASOUND (IVUS) of right lower extermity with balloon angioplasty;  Surgeon: Penne Lonni Colorado, MD;  Location: [facility] OR;  Service: Vascular;  Laterality: Right;   RIGHT/LEFT HEART CATH AND CORONARY ANGIOGRAPHY N/A 04/10/2023   Procedure: RIGHT/LEFT HEART CATH AND CORONARY ANGIOGRAPHY;  Surgeon: Wendel Lurena POUR, MD;  Location: [facility] INVASIVE CV LAB;  Service: Cardiovascular;  Laterality: N/A;   SVT ABLATION N/A 02/20/2017   Procedure: SVT ABLATION;  Surgeon: Waddell Danelle ORN, MD;  Location: [facility] INVASIVE CV LAB;  Service: Cardiovascular;  Laterality: N/A;   TONSILLECTOMY  08/2002   TOTAL KNEE ARTHROPLASTY Right    TOTAL KNEE ARTHROPLASTY Left 02/21/2014   Procedure: LEFT  TOTAL KNEE ARTHROPLASTY;  Surgeon: Maude KANDICE Herald, MD;  Location: [facility] OR;  Service: Orthopedics;  Laterality: Left;   TUBAL LIGATION  1980   ULTRASOUND GUIDANCE FOR VASCULAR ACCESS Right 07/17/2016   Procedure: ULTRASOUND GUIDANCE FOR VASCULAR ACCESS;  Surgeon: Penne Lonni Colorado, MD;  Location: [facility] OR;  Service: Vascular;  Laterality: Right;   VENOGRAM Right 07/17/2016   Procedure: right ASCENDING VENOGRAM WITH ANGIOJET;  Surgeon: Penne Lonni Colorado, MD;  Location: [facility] OR;  Service: Vascular;  Laterality: Right;   Social History   Tobacco Use  Smoking Status Never   Passive exposure: Never  Smokeless Tobacco Never  Cassandra [name] is ready to start the M/W PREP program at [place] on Sept 8 at 12:00.  Cassandra [name] 11/18/2023, 1:54 PM

## 2023-11-23 NOTE — Progress Notes (Signed)
 [facility] PRE[facility] Weekly Session  Patient Details  Name: Cassandra [name] MRN: [id number] Date of Birth: Jul 30, 1955 Age: 68 y.o. PCP: Franchot Houston, PA-C  There were no vitals filed for this visit.   [facility] Weekly seesion - 11/23/23 1400       [facility] PREP Location   [facility] PREP Location [facility]      Weekly Session   Topic Discussed Goal setting and welcome to the program   Went over notebook and strength training log, tour of facility and went over class schedule   Classes attended to date 1          [facility] 11/23/2023, 2:05 PM

## 2023-11-30 NOTE — Progress Notes (Signed)
 [facility] PREP Weekly Session  Patient Details  Name: Cassandra [name] MRN: [id number] Date of Birth: [birth year] Age: 68 y.o. PCP: Franchot Houston, PA-C  Vitals:   11/30/23 1449  Weight: 216 lb 6.4 oz (98.2 kg)     [facility] Weekly seesion - 11/30/23 1400       [facility] PREP Location   [facility] PREP Location [facility]      Weekly Session   Topic Discussed Importance of resistance training;Other ways to be active   Discussed the different between strength training and cardio.   Classes attended to date 3          [name] 11/30/2023, 2:49 PM

## 2023-12-07 ENCOUNTER — Ambulatory Visit: Attending: Cardiovascular Disease | Admitting: Cardiovascular Disease

## 2023-12-07 VITALS — BP 110/60 | HR 76 | Ht 64.0 in | Wt 217.0 lb

## 2023-12-07 DIAGNOSIS — I471 Supraventricular tachycardia, unspecified: Secondary | ICD-10-CM | POA: Diagnosis not present

## 2023-12-07 DIAGNOSIS — M79605 Pain in left leg: Secondary | ICD-10-CM

## 2023-12-07 DIAGNOSIS — I350 Nonrheumatic aortic (valve) stenosis: Secondary | ICD-10-CM

## 2023-12-07 DIAGNOSIS — I10 Essential (primary) hypertension: Secondary | ICD-10-CM

## 2023-12-07 DIAGNOSIS — M79604 Pain in right leg: Secondary | ICD-10-CM

## 2023-12-07 MED ORDER — CLOPIDOGREL BISULFATE 75 MG PO TABS: 75.0000 mg | ORAL_TABLET | Freq: Every day | ORAL | 3 refills | Status: AC

## 2023-12-07 NOTE — Progress Notes (Signed)
 [facility] PREP Weekly Session  Patient Details  Name: Cassandra [name] MRN: [id number] Date of Birth: [birth year] Age: 68 y.o. PCP: Franchot Houston, PA-C  Vitals:   12/07/23 1531  Weight: 220 lb 6.4 oz (100 kg)     [facility] Weekly seesion - 12/07/23 1500       [facility] PREP Location   [facility] PREP Location [facility]      Weekly Session   Topic Discussed Healthy eating tips   Talk about foods to avoid and increase, sugar and sodium limits. Introduced [employer].   Minutes exercised this week 70 minutes    Classes attended to date 5          [facility] 12/07/2023, 3:32 PM

## 2023-12-07 NOTE — Patient Instructions (Signed)
 Medication Instructions:  Your physician has recommended you make the following change in your medication: Stop aspirin  Start Clopidogrel  75 mg by mouth daily   *If you need a refill on your cardiac medications before your next appointment, please call your pharmacy*  Lab Work: none If you have labs (blood work) drawn today and your tests are completely normal, you will receive your results only by: MyChart Message (if you have MyChart) OR A paper copy in the mail If you have any lab test that is abnormal or we need to change your treatment, we will call you to review the results.  Testing/Procedures: Your physician has requested that you have a lower  extremity arterial duplex. This test is an ultrasound of the arteries in the legs   It looks at arterial blood flow in the legs  . Allow one hour for Lower   Arterial scans. There are no restrictions or special instructions.  Please note: We ask at that you not bring children with you during ultrasound (echo/ vascular) testing. Due to room size and safety concerns, children are not allowed in the ultrasound rooms during exams. Our front office staff cannot provide observation of children in our lobby area while testing is being conducted. An adult accompanying a patient to their appointment will only be allowed in the ultrasound room at the discretion of the ultrasound technician under special circumstances. We apologize for any inconvenience.  Your physician has requested that you have an ankle brachial index (ABI). During this test an ultrasound and blood pressure cuff are used to evaluate the arteries that supply the arms and legs with blood. Allow thirty minutes for this exam. There are no restrictions or special instructions.  Please note: We ask at that you not bring children with you during ultrasound (echo/ vascular) testing. Due to room size and safety concerns, children are not allowed in the ultrasound rooms during exams. Our front  office staff cannot provide observation of children in our lobby area while testing is being conducted. An adult accompanying a patient to their appointment will only be allowed in the ultrasound room at the discretion of the ultrasound technician under special circumstances. We apologize for any inconvenience.  Follow-Up: At [facility], you and your health needs are our priority.  As part of our continuing mission to provide you with exceptional heart care, our providers are all part of one team.  This team includes your primary Cardiologist (physician) and Advanced Practice Providers or APPs (Physician Assistants and Nurse Practitioners) who all work together to provide you with the care you need, when you need it.  Your next appointment:   May 30, 2024  Provider:   K. Sebastian, [place]    We recommend signing up for the patient portal called MyChart.  Sign up information is provided on this After Visit Summary.  MyChart is used to connect with patients for Virtual Visits (Telemedicine).  Patients are able to view lab/test results, encounter notes, upcoming appointments, etc.  Non-urgent messages can be sent to your provider as well.   To learn more about what you can do with MyChart, go to [email or web address].   Other Instructions

## 2023-12-07 NOTE — Progress Notes (Signed)
 Structural Heart Clinic Note  Chief Complaint  Patient presents with   Follow-up    Aortic valve disease   History of Present Illness: 68 yo female with history of severe aortic stenosis now s/p TAVR, HTN, SVT, sleep apnea, obesity and DM who is here today for follow up. She had been followed in our office by Dr. Alveta. Echo in August 2025 with severe paradoxical low flow/low gradient AS. Cardiac cath January 2025 with no evidence of CAD. She underwent TAVR on 06/02/23 with placement of 26 mm Medtronic Evolut FX THV from the transfemoral approach. She did well following the procedure. Echo on April 2025 with normal LV function. AVR working well with mild PVL. Negative scan for amyloid in August 2025.   She is here today for follow up. The patient denies any chest pain, dyspnea, palpitations, lower extremity edema, orthopnea, PND, dizziness, near syncope or syncope. She has some cramping in the bilateral calf muscles after walking. She had a LE test done by the nurse from her insurance company but she forgot to bring the results. It sounds like an ABI.   Primary Care Physician: Franchot Houston, PA-C   Past Medical History:  Diagnosis Date   Allergy    Anxiety    Complication of anesthesia    difficulty waking up   Depression    GERD (gastroesophageal reflux disease)    Hypertension    Leukocytoclastic vasculitis (HCC)    Dr. Geronimo and Dr. 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 Verlin & Dr. Maryjane   Severe aortic stenosis    Sleep apnea    cpap   SVT (supraventricular tachycardia)    Type 2 diabetes mellitus with kidney complication, without long-term current use of insulin  (HCC) 05/07/2016   Verrucae vulgaris    Wegner's disease (congenital syphilitic osteochondritis)     Past Surgical History:  Procedure  Laterality Date   BUNIONECTOMY Bilateral    CARPAL TUNNEL RELEASE Right 2002   INTRAOPERATIVE TRANSTHORACIC ECHOCARDIOGRAM N/A 06/02/2023   Procedure: ECHOCARDIOGRAM, TRANSTHORACIC;  Surgeon: Verlin Lonni BIRCH, MD;  Location: [facility] INVASIVE CV LAB;  Service: Cardiovascular;  Laterality: N/A;   JOINT REPLACEMENT Right    knee   LOWER EXTREMITY VENOGRAPHY Right 07/14/2016   Procedure: Lower Extremity Venography;  Surgeon: Penne Lonni Colorado, MD;  Location: [facility] INVASIVE CV LAB;  Service: Cardiovascular;  Laterality: Right;   PERCUTANEOUS VENOUS THROMBECTOMY,LYSIS WITH INTRAVASCULAR ULTRASOUND (IVUS) Right 07/17/2016   Procedure: PERCUTANEOUS VENOUS THROMBECTOMY AND LYSIS WITH INTRAVASCULAR ULTRASOUND (IVUS) of right lower extermity with balloon angioplasty;  Surgeon: Penne Lonni Colorado, MD;  Location: [facility] OR;  Service: Vascular;  Laterality: Right;   RIGHT/LEFT HEART CATH AND CORONARY ANGIOGRAPHY N/A 04/10/2023   Procedure: RIGHT/LEFT HEART CATH AND CORONARY ANGIOGRAPHY;  Surgeon: Wendel Lurena POUR, MD;  Location: [facility] INVASIVE CV LAB;  Service: Cardiovascular;  Laterality: N/A;   SVT ABLATION N/A 02/20/2017   Procedure: SVT ABLATION;  Surgeon: Waddell Danelle ORN, MD;  Location: [facility] INVASIVE CV LAB;  Service: Cardiovascular;  Laterality: N/A;   TONSILLECTOMY  08/2002   TOTAL KNEE ARTHROPLASTY Right    TOTAL KNEE ARTHROPLASTY Left 02/21/2014   Procedure: LEFT TOTAL KNEE ARTHROPLASTY;  Surgeon: Maude KANDICE Herald, MD;  Location: [facility] OR;  Service: Orthopedics;  Laterality: Left;   TUBAL  LIGATION  1980   ULTRASOUND GUIDANCE FOR VASCULAR ACCESS Right 07/17/2016   Procedure: ULTRASOUND GUIDANCE FOR VASCULAR ACCESS;  Surgeon: Penne Lonni Colorado, MD;  Location[facility] OR;  Service: Vascular;  Laterality: Right;   VENOGRAM Right 07/17/2016   Procedure: right ASCENDING VENOGRAM WITH ANGIOJET;  Surgeon: Penne Lonni Colorado, MD;  Location: [facility] OR;  Service: Vascular;  Laterality: Right;    Current  Outpatient Medications  Medication Sig Dispense Refill   acetaminophen  (TYLENOL ) 500 MG tablet Take 1,000 mg by mouth every 6 (six) hours as needed for moderate pain (pain score 4-6) or mild pain (pain score 1-3).     albuterol  (VENTOLIN  HFA) 108 (90 Base) MCG/ACT inhaler Inhale 2 puffs into the lungs every 6 (six) hours as needed (Congestion).     alendronate (FOSAMAX) 70 MG tablet Take 70 mg by mouth every Sunday. Take with a full glass of water on an empty stomach.     atorvastatin  (LIPITOR) 20 MG tablet Take 20 mg by mouth at bedtime.     cetirizine (ZYRTEC) 10 MG tablet Take 10 mg by mouth in the morning.     Cholecalciferol  25 MCG (1000 UT) tablet Take 1,000 Units by mouth daily.     clindamycin  (CLEOCIN  T) 1 % external solution Apply topically to bumps at back of neck once to twice daily as needed 60 mL 11   clobetasol  (TEMOVATE ) 0.05 % external solution Apply 1 Application topically 2 (two) times daily. Apply to itchy areas of scalp 50 mL 3   clopidogrel  (PLAVIX ) 75 MG tablet Take 1 tablet (75 mg total) by mouth daily. 90 tablet 3   doxycycline  (VIBRA -TABS) 100 MG tablet Take 1 tablet by mouth ONE HOUR BEFORE ANY DENTAL PROCEDURES, INCLUDING CLEANINGS 3 tablet 1   escitalopram  (LEXAPRO ) 20 MG tablet Take 20 mg by mouth in the morning.     ipratropium (ATROVENT) 0.06 % nasal spray Place 1 spray into both nostrils at bedtime.     ketoconazole  (NIZORAL ) 2 % shampoo Apply 1 Application topically 3 (three) times a week. Wash scalp 3 time weekly, let sit 5 minutes and rinse out 120 mL 11   montelukast  (SINGULAIR ) 10 MG tablet Take 10 mg by mouth at bedtime.     Multiple Vitamin (MULTI-VITAMIN) tablet Take 1 tablet by mouth daily.     pantoprazole  (PROTONIX ) 40 MG tablet Take 40 mg by mouth daily.     QULIPTA 60 MG TABS Take 1 tablet by mouth daily.     Rimegepant Sulfate (NURTEC) 75 MG TBDP Take 75 mg by mouth daily as needed (for migraines).     telmisartan (MICARDIS) 20 MG tablet Take 20 mg  by mouth in the morning.     No current facility-administered medications for this visit.    Allergies  Allergen Reactions   Nitrofurantoin  Itching and Shortness Of Breath   Effexor [Venlafaxine] Hives   Myrbetriq [Mirabegron] Other (See Comments)    BACK PAIN DRY MOUTH   Nsaids Other (See Comments) and Hives    Hypersensitivity vasculitis VASCULITIS   Reglan [Metoclopramide] Other (See Comments)    Blister in mouth    Clarithromycin     Eyes and mouth irritation    Flagyl [Metronidazole] Hives   Ketoprofen Itching    Irritates eyes    Levofloxacin      Unknown reaction    Paxil [Paroxetine]     Deep itch   Tolterodine Other (See Comments)    Unknown reaction    Ampicillin Hives  and Itching   Cholecalciferol  Rash    In high doses    Codeine Other (See Comments)    Head feels like its crawling   Fluoxetine Hcl Itching    Deep Itch    Sulfa Antibiotics Nausea And Vomiting and Other (See Comments)    Stomach irritation    Tetracycline Nausea And Vomiting    Severe stomach pain    Social History   Socioeconomic History   Marital status: Divorced    Spouse name: Not on file   Number of children: Not on file   Years of education: Not on file   Highest education level: Not on file  Occupational History   Occupation: Unemployed  Tobacco Use   Smoking status: Never    Passive exposure: Never   Smokeless tobacco: Never  Substance and Sexual Activity   Alcohol use: No   Drug use: No   Sexual activity: Not on file  Other Topics Concern   Not on file  Social History Narrative   Not on file   Social Drivers of Health   Financial Resource Strain: Medium Risk (05/20/2023)   Received from [facility]   Overall Financial Resource Strain (CARDIA)    Difficulty of Paying Living Expenses: Somewhat hard  Food Insecurity: Food Insecurity Present (05/20/2023)   Received [facility]   Hunger Vital Sign    Within the past 12 months,  you worried that your food would run out before you got the money to buy more.: Sometimes true    Within the past 12 months, the food you bought just didn't last and you didn't have money to get more.: Sometimes true  Transportation Needs: No Transportation Needs (05/20/2023)   Received from [facility] - Transportation    In the past 12 months, has lack of transportation kept you from medical appointments or from getting medications?: No    Lack of Transportation (Non-Medical): No  Physical Activity: Not on file  Stress: Not on file  Social Connections: Not on file  Intimate Partner Violence: Not on file    Family History  Problem Relation Age of Onset   Clotting disorder Mother 35       Blood clot, foot amputation   Cervical cancer Mother    Arthritis Sister    Heart attack Maternal Grandmother 16   Breast cancer Sister 30   Asthma Cousin     Review of Systems:  As stated in the HPI and otherwise negative.   BP 110/60 (BP Location: Left Arm, Patient Position: Sitting, Cuff Size: Normal)   Pulse 76   Ht 5' 4 (1.626 m)   Wt 217 lb (98.4 kg)   BMI 37.25 kg/m   Physical Examination: General: Well developed, well nourished, NAD  HEENT: OP clear, mucus membranes moist  SKIN: warm, dry. No rashes. Neuro: No focal deficits  Musculoskeletal: Muscle strength 5/5 all ext  Psychiatric: Mood and affect normal  Neck: No JVD, no carotid bruits, no thyromegaly, no lymphadenopathy.  Lungs:Clear bilaterally, no wheezes, rhonci, crackles Cardiovascular: Regular rate and rhythm. No murmurs, gallops or rubs. Abdomen:Soft. Bowel sounds present. Non-tender.  Extremities: No lower extremity edema.   EKG:  EKG is not ordered today. The ekg ordered today demonstrates   Recent Labs: 06/03/2023: Magnesium  1.9; Platelets 115 07/24/2023: ALT 27; BUN 26; Creatinine, Ser 1.20; Hemoglobin 13.3; Potassium 4.6; Sodium 136   Lipid Panel No results found for: CHOL,  TRIG, HDL,  CHOLHDL, VLDL, LDLCALC, LDLDIRECT   Wt Readings from Last 3 Encounters:  12/07/23 217 lb (98.4 kg)  12/07/23 220 lb 6.4 oz (100 kg)  11/30/23 216 lb 6.4 oz (98.2 kg)    Assessment and Plan:   1. Severe aortic stenosis s/p TAVR: Doing well 6 months out from her TAVR. NYHA class 2. Echo April 2025 with normal LV function and normally functioning AVR with mild AI. She does not tolerate ASA due to reported vasculitis with NSAIDS. Start Plavix  75 mg daily. Continue SBE prophylaxis when indicated.    2. HTN: BP is well controlled. No changes today.   3. SVT: No palpitations.   4. Leg pain: She has what sounds like an abnormal ABI by her insurance company nurse at home. She is not sure what test was done. She has bilateral leg pain/calf pain with walking. Will arrange LE arterial dopplers/ABI  Labs/ tests ordered today include:  No orders of the defined types were placed in this encounter.  Disposition:   F/U with structural team as planned in March 2026 for one year TAVR visit. Follow up with me in one year.   Signed, Lonni Cash, MD, [facility] 12/07/2023 4:27 PM    [facility] [address] [place], [place], [place]  [address] Phone: [phone]; Fax: [phone]

## 2023-12-08 ENCOUNTER — Telehealth: Payer: Self-pay | Admitting: Cardiovascular Disease

## 2023-12-08 NOTE — Telephone Encounter (Signed)
 Called pt advised of MD response.  Pt had no further questions or concerns.

## 2023-12-08 NOTE — Telephone Encounter (Signed)
 Patient called to let Dr. Verlin know the screen that was done when the nurse from [employer] came to her her house was peripheral artery disease.  She used a spirometry. The results left leg 0.92, right leg 0.64.

## 2023-12-11 ENCOUNTER — Ambulatory Visit (HOSPITAL_COMMUNITY)
Admission: RE | Admit: 2023-12-11 | Discharge: 2023-12-11 | Disposition: A | Discharge status: Home / Self Care | Source: Ambulatory Visit | Attending: Cardiovascular Disease | Admitting: Cardiovascular Disease

## 2023-12-11 DIAGNOSIS — M79604 Pain in right leg: Secondary | ICD-10-CM | POA: Diagnosis present

## 2023-12-11 DIAGNOSIS — M79605 Pain in left leg: Secondary | ICD-10-CM | POA: Diagnosis present

## 2023-12-11 LAB — VAS US ABI WITH/WO TBI
Left ABI: 1.14
Right ABI: 1.07

## 2023-12-14 ENCOUNTER — Ambulatory Visit: Payer: Self-pay | Admitting: Cardiovascular Disease

## 2023-12-14 NOTE — Progress Notes (Signed)
 [employer] [facility] Weekly Session  Patient Details  Name: Cassandra [name] MRN: [id number] Date of Birth: 1955/08/28 Age: 68 y.o. PCP: Franchot Houston, PA-C  Vitals:   12/14/23 1342  Weight: 217 lb 9.6 oz (98.7 kg)     [employer] Weekly seesion - 12/14/23 1300       [facility] PRE[facility] Location   [facility] Location [facility]      Weekly Session   Topic Discussed Health habits   Talked about ways to make a healthy lifestyle changes a habit, sugar demo   Classes attended to date 7          [facility] 12/14/2023, 1:42 PM

## 2023-12-21 NOTE — Progress Notes (Signed)
 [employer] [facility] Weekly Session  Patient Details  Name: Cassandra [name] MRN: [id number] Date of Birth: May 02, 1955 Age: 68 y.o. PCP: Franchot Houston, PA-C  Vitals:   12/21/23 1410  Weight: 218 lb 3.2 oz (99 kg)     [employer] Weekly seesion - 12/21/23 1400       [facility] PRE[facility] Location   [facility] PRE[facility] Location [facility]      Weekly Session   Topic Discussed Restaurant Eating   Restaurant tips and advice, nutrition label, salt demo   Classes attended to date [address] 12/21/2023, 2:11 PM

## 2023-12-28 NOTE — Progress Notes (Signed)
 [employer] [facility] Weekly Session  Patient Details  Name: Cassandra [name] MRN: [id number] Date of Birth: 1955/05/09 Age: 68 y.o. PCP: Franchot Houston, PA-C  Vitals:   12/28/23 1521  Weight: 219 lb 3.2 oz (99.4 kg)     [employer] Weekly seesion - 12/28/23 1500       [facility] PRE[facility] Location   [facility] PRE[facility] Location [facility]      Weekly Session   Topic Discussed Stress management and problem solving   Stress management, breathing technique, sleep tips   Classes attended to date [address] 12/28/2023, 3:22 PM

## 2024-01-08 DIAGNOSIS — K5900 Constipation, unspecified: Secondary | ICD-10-CM | POA: Insufficient documentation

## 2024-01-08 DIAGNOSIS — Z1211 Encounter for screening for malignant neoplasm of colon: Secondary | ICD-10-CM | POA: Insufficient documentation

## 2024-01-08 DIAGNOSIS — R1310 Dysphagia, unspecified: Secondary | ICD-10-CM | POA: Insufficient documentation

## 2024-01-11 ENCOUNTER — Encounter: Payer: Self-pay | Admitting: Student in an Organized Health Care Education/Training Program

## 2024-01-11 ENCOUNTER — Ambulatory Visit (INDEPENDENT_AMBULATORY_CARE_PROVIDER_SITE_OTHER): Admitting: Student in an Organized Health Care Education/Training Program

## 2024-01-11 VITALS — BP 108/54 | HR 65 | Wt 218.0 lb

## 2024-01-11 DIAGNOSIS — G43909 Migraine, unspecified, not intractable, without status migrainosus: Secondary | ICD-10-CM | POA: Insufficient documentation

## 2024-01-11 DIAGNOSIS — F32A Depression, unspecified: Secondary | ICD-10-CM

## 2024-01-11 DIAGNOSIS — I10 Essential (primary) hypertension: Secondary | ICD-10-CM

## 2024-01-11 DIAGNOSIS — E782 Mixed hyperlipidemia: Secondary | ICD-10-CM

## 2024-01-11 DIAGNOSIS — N3281 Overactive bladder: Secondary | ICD-10-CM

## 2024-01-11 DIAGNOSIS — R7303 Prediabetes: Secondary | ICD-10-CM

## 2024-01-11 DIAGNOSIS — G43009 Migraine without aura, not intractable, without status migrainosus: Secondary | ICD-10-CM

## 2024-01-11 DIAGNOSIS — J309 Allergic rhinitis, unspecified: Secondary | ICD-10-CM | POA: Insufficient documentation

## 2024-01-11 DIAGNOSIS — J302 Other seasonal allergic rhinitis: Secondary | ICD-10-CM

## 2024-01-11 DIAGNOSIS — N058 Unspecified nephritic syndrome with other morphologic changes: Secondary | ICD-10-CM

## 2024-01-11 DIAGNOSIS — N057 Unspecified nephritic syndrome with diffuse crescentic glomerulonephritis: Secondary | ICD-10-CM

## 2024-01-11 DIAGNOSIS — R4189 Other symptoms and signs involving cognitive functions and awareness: Secondary | ICD-10-CM | POA: Insufficient documentation

## 2024-01-11 MED ORDER — IPRATROPIUM BROMIDE 0.06 % NA SOLN
1.0000 | Freq: Every day | NASAL | 3 refills | Status: AC
Start: 2024-01-11 — End: ?

## 2024-01-11 NOTE — Patient Instructions (Signed)
  VISIT SUMMARY: Today, you had a new patient consultation where we reviewed your medical history and current treatments. We discussed your recovery from the aortic valve replacement, your chronic kidney disease, and other ongoing health issues. We also talked about your medications and their effects, as well as your efforts to manage your weight and mood.  YOUR PLAN: -CHRONIC KIDNEY DISEASE STAGE 3A: Chronic kidney disease stage 3A means your kidneys are moderately damaged and not working as well as they should. This is due to your vasculitis, which is currently in remission. Continue taking telmisartan to protect your kidneys and follow up with your nephrologist, Dr. Kolaroo.  -AORTIC VALVE REPLACEMENT (TAVR): You have had an aortic valve replacement, which has improved your ability to exercise and reduced your shortness of breath. Continue taking Plavix  as prescribed and watch for any signs of valve problems or complications.  -URGE INCONTINENCE WITH DETRUSOR OVERACTIVITY: Urge incontinence with detrusor overactivity means you have a strong, sudden need to urinate due to overactive bladder muscles. Previous treatments have not worked, and there are cost barriers for a bladder stimulator. You will be referred to [facility] for further evaluation and possible bladder stimulator placement.  -MIGRAINE HEADACHES: Your migraine headaches have worsened since your heart surgery. Continue taking Qulipta as prescribed and follow up with your neurologist, Dr. Loreli, if needed.  -ALLERGIC RHINITIS WITH RECURRENT SINUS INFECTIONS: Allergic rhinitis means you have allergies that cause a runny or stuffy nose, and you also have frequent sinus infections. Continue taking Zyrtec in the morning and Singulair  at night to help prevent these infections.  -OSTEOARTHRITIS OF THE HAND (CARPOMETACARPAL JOINT): Osteoarthritis of the hand means you have joint pain and stiffness due to cartilage loss in your hand joints.  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  -OBESITY AND PREDIABETES: Obesity and prediabetes mean you have a higher than normal body weight and blood sugar levels that are higher than normal but not yet in the diabetes range. Continue your efforts with diet and exercise to manage your weight.  -DEPRESSION: Depression is a mood disorder that causes persistent feelings of sadness and loss of interest. You are currently taking Lexapro , which has helped improve your mood. Continue taking Lexapro  as prescribed and monitor your mood for any changes.  INSTRUCTIONS: Please follow up with your nephrologist, Dr. Kolaroo, for your chronic kidney disease. You should also continue to see your neurologist, Dr. Loreli, for your migraines. Additionally, you will be referred to [facility] for further evaluation of your urge incontinence. Continue taking all your medications as prescribed and monitor for any changes in your symptoms.

## 2024-01-11 NOTE — Assessment & Plan Note (Signed)
 Chronic and stable.  Blood pressure is at goal currently on telmisartan 20 mg daily.  Renal function is normal.

## 2024-01-11 NOTE — Assessment & Plan Note (Signed)
 Has concerns about her memory, but on my evaluation I think she is doing quite well.  This is my first time meeting her but she was able to tell me about her complex medical history with good accuracy.  She is still functional, independent, doing well driving.  Neurologic exam is normal with no signs of parkinsonism.  No history of cerebral infarctions.  I do not see any signs of dementia at this time.  I recommended continuing lifestyle changes including daily exercise, increase socializing, healthy sleep 8-9 hours per night, and healthy nutrition.  We can do dedicated memory testing in the future if needed.

## 2024-01-11 NOTE — Progress Notes (Signed)
 New Patient Office Visit  Patient ID: Cassandra [name], Female   DOB: 02/10/1956 68 y.o. MRN: [id number]  Chief Complaint  Patient presents with   Establish Care    Memory concern Patient has had lab work completed on 12/29/2023  Eye Exam-1 year ago ( [facility] eye)  Cologuard-couple years ago Mammogram-([facility]) Dexa Scan-has been a couple years will repeat as long as insurance covers Covid-Declined    Subjective:     Cassandra [name] presents to establish care  HPI  Discussed the use of AI scribe software for clinical note transcription with the patient, who gave verbal consent to proceed.  History of Present Illness Cassandra [name] is a 68 year old female who presents for a new patient consultation.  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 There is significant improvement in her ability to walk without gasping for breath, although some limitations persist. She attends cardiac rehab and participates in a health program at the [facility] focusing on diet and exercise. She is currently on atorvastatin  and Plavix .  She has a history of vasculitis, specifically posi-immune glomerulonephritis. She is treated with telmisartan. She has a history of proteinuria and hematuria.  She experiences memory issues, which she attributes to her medications. She is still able to drive but avoids long distances. She has a history of a blood clot in her leg in 2018, attributed to inactivity during chemotherapy for her vasculitis. She is no longer on blood thinners.  She reports urinary incontinence and has undergone extensive testing, including urodynamics, which showed high pressure overactivity. Botox  injections were unsuccessful.  She has a history of migraine headaches, which worsened after her heart surgery. She is currently on Qulipta, prescribed by her neurologist.  She has a history of allergies and sinus infections, for which she takes Zyrtec in the  morning and Singulair  at night. She does not have asthma and does not use inhalers.  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 She experiences pain in her hands due to cartilage loss.  She is on Lexapro  for mood stabilization and reports that it helps her feel better, although she experiences natural mood fluctuations.  She is trying to manage her weight through diet and exercise, although she struggles with a sweet tooth.   Outpatient Encounter Medications as of 01/11/2024  Medication Sig   alendronate (FOSAMAX) 70 MG tablet Take 70 mg by mouth every Sunday. Take with a full glass of water on an empty stomach.   atorvastatin  (LIPITOR) 20 MG tablet Take 20 mg by mouth at bedtime.   clobetasol  (TEMOVATE ) 0.05 % external solution Apply 1 Application topically 2 (two) times daily. Apply to itchy areas of scalp   clopidogrel  (PLAVIX ) 75 MG tablet Take 1 tablet (75 mg total) by mouth daily.   escitalopram  (LEXAPRO ) 20 MG tablet Take 20 mg by mouth in the morning.   montelukast  (SINGULAIR ) 10 MG tablet Take 10 mg by mouth at bedtime.   pantoprazole  (PROTONIX ) 40 MG tablet Take 40 mg by mouth daily.   QULIPTA 60 MG TABS Take 1 tablet by mouth daily.   Rimegepant Sulfate (NURTEC) 75 MG TBDP Take 75 mg by mouth daily as needed (for migraines).   telmisartan (MICARDIS) 20 MG tablet Take 20 mg by mouth in the morning.   [DISCONTINUED] acetaminophen  (TYLENOL ) 500 MG tablet Take 1,000 mg by mouth every 6 (six)  hours as needed for moderate pain (pain score 4-6) or mild pain (pain score 1-3).   [DISCONTINUED] cetirizine (ZYRTEC) 10 MG tablet Take 10 mg by mouth in the morning.   [DISCONTINUED] Cholecalciferol  25 MCG (1000 UT) tablet Take 1,000 Units by mouth daily.   [DISCONTINUED] clindamycin  (CLEOCIN  T) 1 % external solution Apply topically to bumps at back of neck once to twice daily as needed   [DISCONTINUED] doxycycline   (VIBRA -TABS) 100 MG tablet Take 1 tablet by mouth ONE HOUR BEFORE ANY DENTAL PROCEDURES, INCLUDING CLEANINGS   [DISCONTINUED] ipratropium (ATROVENT) 0.06 % nasal spray Place 1 spray into both nostrils at bedtime.   [DISCONTINUED] ketoconazole  (NIZORAL ) 2 % shampoo Apply 1 Application topically 3 (three) times a week. Wash scalp 3 time weekly, let sit 5 minutes and rinse out   [DISCONTINUED] Multiple Vitamin (MULTI-VITAMIN) tablet Take 1 tablet by mouth daily.   ipratropium (ATROVENT) 0.06 % nasal spray Place 1 spray into both nostrils at bedtime.   [DISCONTINUED] albuterol  (VENTOLIN  HFA) 108 (90 Base) MCG/ACT inhaler Inhale 2 puffs into the lungs every 6 (six) hours as needed (Congestion).   No facility-administered encounter medications on file as of 01/11/2024.    Past Medical History:  Diagnosis Date   Allergy    Anxiety    Complication of anesthesia    difficulty waking up   Depression    GERD (gastroesophageal reflux disease)    HEPATITIS C 12/07/2008   Qualifier: Diagnosis of   By: Shellia MD, Vineet         History of deep venous thrombosis 07/08/2022   History of gross hematuria 02/20/2016   Hypertension    Leukocytoclastic vasculitis (HCC)    Dr. Geronimo and Dr. 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 Verlin & Dr. Maryjane   Severe aortic stenosis    Sleep apnea    cpap   SVT (supraventricular tachycardia)    Type 2 diabetes mellitus with kidney complication, without long-term current use of insulin  (HCC) 05/07/2016   Verrucae vulgaris    Wegner's disease (congenital syphilitic osteochondritis)     Past Surgical History:  Procedure Laterality Date   BUNIONECTOMY Bilateral    CARPAL TUNNEL RELEASE Right 2002   INTRAOPERATIVE TRANSTHORACIC ECHOCARDIOGRAM N/A 06/02/2023   Procedure:  ECHOCARDIOGRAM, TRANSTHORACIC;  Surgeon: Verlin Lonni BIRCH, MD;  Location: [facility] INVASIVE CV LAB;  Service: Cardiovascular;  Laterality: N/A;   JOINT REPLACEMENT Right    knee   LOWER EXTREMITY VENOGRAPHY Right 07/14/2016   Procedure: Lower Extremity Venography;  Surgeon: Penne Lonni Colorado, MD;  Location[facility] INVASIVE CV LAB;  Service: Cardiovascular;  Laterality: Right;   PERCUTANEOUS VENOUS THROMBECTOMY,LYSIS WITH INTRAVASCULAR ULTRASOUND (IVUS) Right 07/17/2016   Procedure: PERCUTANEOUS VENOUS THROMBECTOMY AND LYSIS WITH INTRAVASCULAR ULTRASOUND (IVUS) of right lower extermity with balloon angioplasty;  Surgeon: Penne Lonni Colorado, MD;  Location: [facility] OR;  Service: Vascular;  Laterality: Right;   RIGHT/LEFT HEART CATH AND CORONARY ANGIOGRAPHY N/A 04/10/2023   Procedure: RIGHT/LEFT HEART CATH AND CORONARY ANGIOGRAPHY;  Surgeon: Wendel Lurena POUR, MD;  Location: [facility] INVASIVE CV LAB;  Service: Cardiovascular;  Laterality: N/A;   SVT ABLATION N/A 02/20/2017   Procedure: SVT ABLATION;  Surgeon: Waddell Danelle ORN, MD;  Location: [facility] INVASIVE CV LAB;  Service: Cardiovascular;  Laterality: N/A;  TONSILLECTOMY  08/2002   TOTAL KNEE ARTHROPLASTY Right    TOTAL KNEE ARTHROPLASTY Left 02/21/2014   Procedure: LEFT TOTAL KNEE ARTHROPLASTY;  Surgeon: Maude KANDICE Herald, MD;  Location: [facility] OR;  Service: Orthopedics;  Laterality: Left;   TUBAL LIGATION  1980   ULTRASOUND GUIDANCE FOR VASCULAR ACCESS Right 07/17/2016   Procedure: ULTRASOUND GUIDANCE FOR VASCULAR ACCESS;  Surgeon: Penne Lonni Colorado, MD;  Location[facility] OR;  Service: Vascular;  Laterality: Right;   VENOGRAM Right 07/17/2016   Procedure: right ASCENDING VENOGRAM WITH ANGIOJET;  Surgeon: Penne Lonni Colorado, MD;  Location: [facility] OR;  Service: Vascular;  Laterality: Right;    Family History  Problem Relation Age of Onset   Clotting disorder Mother 72       Blood clot, foot amputation   Cervical cancer Mother    Arthritis Sister    Heart  attack Maternal Grandmother 41   Breast cancer Sister 30   Asthma Cousin        Objective:    BP (!) 108/54   Pulse 65   Wt 218 lb (98.9 kg)   BMI 37.42 kg/m   Physical Exam  Gen: Well-appearing woman Eyes: Normal Ears: Hearing aids in place bilaterally, normal tympanic membranes Mouth: Normal oropharynx Neck: Normal thyroid , no nodules or adenopathy Heart: Regular, 3 out of 6 holosystolic murmur best heard at the right upper sternal border Lungs: Unlabored, clear throughout with no wheezing or crackles Ext: Bilateral knee replacements, no lower extremity edema, warm Neuro: Alert, conversational, full strength upper and lower extremities, moderately delayed get up and go, no assistance needed to get onto the table, normal gait and balance, no tremor Psych: Appropriate mood and affect, not anxious or depressed.     Assessment & Plan:   Problem List Items Addressed This Visit       High   OAB (overactive bladder) (Chronic)   Chronic issue for her for many years.  Previously managed with Dr. Agustin.  She has had a full workup, urodynamics showed very elevated detrusor muscle activity.  She has tried medication management without benefit, tried Botox  without benefit.  Was considering peripheral nerve modulator, but unable to afford the out of pocket cost.  I encouraged her to follow back up with [facility] and see if there are other interventions that are affordable for her.  The urge incontinence that she is experiencing is very bothersome at times.      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  Diagnosed in 2018.  Positive ANCA autoantibodies.  Doing very well currently, last creatinine earlier this month was 1.0.  Not on immunosuppression.  Nephrologist is Dr. Lavinia with [facility].  Currently on treatment with telmisartan alone, which is excellent.  No pulmonary manifestations.  No other skin  manifestations or joint manifestations.      Prediabetes (Chronic)   Chronic and stable.  Last hemoglobin A1c was 5.7% just earlier this month.  Over the last couple years has been as high as 6.0%.  Associated with obesity, but otherwise doing really well with diet modifications.  She has been offered GLP-1 agonists in the past but has declined.  We talked about continuing diet, I recommended calorie counting, and recommended increasing exercise on a daily basis.        Medium    Depression (Chronic)   Chronic and stable depression.  Tolerating Lexapro  20 mg daily without side effects.  Has been on this medication for  many years.      Hyperlipidemia, mixed (Chronic)   Chronic and stable.  No ischemic events.  Tolerating atorvastatin  20 mg daily well.      Benign essential hypertension (Chronic)   Chronic and stable.  Blood pressure is at goal currently on telmisartan 20 mg daily.  Renal function is normal.      Concern about memory   Has concerns about her memory, but on my evaluation I think she is doing quite well.  This is my first time meeting her but she was able to tell me about her complex medical history with good accuracy.  She is still functional, independent, doing well driving.  Neurologic exam is normal with no signs of parkinsonism.  No history of cerebral infarctions.  I do not see any signs of dementia at this time.  I recommended continuing lifestyle changes including daily exercise, increase socializing, healthy sleep 8-9 hours per night, and healthy nutrition.  We can do dedicated memory testing in the future if needed.        Low   Allergic rhinitis (Chronic)   Chronic and stable.  She reports a history of frequent acute sinusitis.  Uses Singulair  and intranasal ipratropium as needed.  Seems like a reasonable regimen that we will continue.      Relevant Medications   ipratropium (ATROVENT) 0.06 % nasal spray    Return in about 3 months (around 04/12/2024).    Cleatus Debby Specking, MD  [facility]

## 2024-01-11 NOTE — Progress Notes (Signed)
 [employer] [facility] Weekly Session  Patient Details  Name: Cassandra [name] MRN: [id number] Date of Birth: 1955/05/19 Age: 68 y.o. PCP: Franchot Houston, PA-C  Vitals:   01/11/24 1450  Weight: 216 lb 6.4 oz (98.2 kg)     [employer] Weekly seesion - 01/11/24 1400       [employer] PRE[facility] Location   [facility] PRE[facility] Location [facility]      Weekly Session   Topic Discussed Other   Portion size matters and showed the class whate portion sizes look like   Classes attended to date 14          Jerona Irving 01/11/2024, 2:53 PM

## 2024-01-11 NOTE — Assessment & Plan Note (Signed)
 Chronic and stable.  No ischemic events.  Tolerating atorvastatin  20 mg daily well.

## 2024-01-11 NOTE — Assessment & Plan Note (Signed)
 Chronic and stable depression.  Tolerating Lexapro  20 mg daily without side effects.  Has been on this medication for many years.

## 2024-01-11 NOTE — Assessment & Plan Note (Signed)
 Chronic and stable.  Diagnosed in 2018.  Positive ANCA autoantibodies.  Doing very well currently, last creatinine earlier this month was 1.0.  Not on immunosuppression.  Nephrologist is Dr. Lavinia with [facility].  Currently on treatment with telmisartan alone, which is excellent.  No pulmonary manifestations.  No other skin manifestations or joint manifestations.

## 2024-01-11 NOTE — Assessment & Plan Note (Signed)
 Chronic issue for her for many years.  Previously managed with Dr. Agustin.  She has had a full workup, urodynamics showed very elevated detrusor muscle activity.  She has tried medication management without benefit, tried Botox  without benefit.  Was considering peripheral nerve modulator, but unable to afford the out of pocket cost.  I encouraged her to follow back up with [facility] and see if there are other interventions that are affordable for her.  The urge incontinence that she is experiencing is very bothersome at times.

## 2024-01-11 NOTE — Assessment & Plan Note (Signed)
 Chronic and stable.  She reports a history of frequent acute sinusitis.  Uses Singulair  and intranasal ipratropium as needed.  Seems like a reasonable regimen that we will continue.

## 2024-01-11 NOTE — Assessment & Plan Note (Signed)
 Chronic and stable.  Last hemoglobin A1c was 5.7% just earlier this month.  Over the last couple years has been as high as 6.0%.  Associated with obesity, but otherwise doing really well with diet modifications.  She has been offered GLP-1 agonists in the past but has declined.  We talked about continuing diet, I recommended calorie counting, and recommended increasing exercise on a daily basis.

## 2024-01-18 NOTE — Progress Notes (Signed)
 [employer] [facility] Weekly Session  Patient Details  Name: MARTENA EMANUELE MRN: [id number] Date of Birth: 1955-06-12 Age: 68 y.o. PCP: Jerrell Cleatus Ned, MD  There were no vitals filed for this visit.   [employer] Weekly seesion - 01/18/24 1500       [employer] PREP Location   [facility] PREP Location [employer]      Weekly Session   Topic Discussed Calorie breakdown   Talk Macros calorie and apps that can help.   Classes attended to date 28          Jerona Irving 01/18/2024, 3:09 PM

## 2024-01-25 NOTE — Progress Notes (Signed)
 [employer] [facility] Weekly Session  Patient Details  Name: Cassandra [name] MRN: [id number] Date of Birth: 1955-08-21 Age: 68 y.o. PCP: Jerrell Cleatus Ned, MD  Vitals:   01/25/24 1516  Weight: 215 lb 12.8 oz (97.9 kg)     [employer] Weekly seesion - 01/25/24 1500       [facility] PREP Location   [facility] PREP Location [facility]      Weekly Session   Topic Discussed Finding support    Classes attended to date 18          Cassandra [name] 01/25/2024, 3:18 PM

## 2024-02-01 ENCOUNTER — Other Ambulatory Visit: Payer: Self-pay | Admitting: Urology

## 2024-02-01 ENCOUNTER — Telehealth: Payer: Self-pay | Admitting: Cardiovascular Disease

## 2024-02-01 NOTE — Progress Notes (Signed)
 [employer] [facility] Weekly Session  Patient Details  Name: ALBIRDA SHIEL MRN: [id number] Date of Birth: 12/13/1955 Age: 68 y.o. PCP: Jerrell Cleatus Ned, MD  Vitals:   02/01/24 1535  Weight: 216 lb (98 kg)     [employer] Weekly seesion - 02/01/24 1500       [facility] PREP Location   [facility] PREP Location [facility]      Weekly Session   Topic Discussed Hitting roadblocks   Hitting roadblocks, went over Hungry carving ques 100cal snacks good and bad.   Classes attended to date 71          Jerona Irving 02/01/2024, 3:37 PM

## 2024-02-01 NOTE — Telephone Encounter (Signed)
   Pre-operative Risk Assessment    Patient Name: Cassandra [name]  DOB: June 07, 1955 MRN: [id number]   Date of last office visit: 12/07/2023 Date of next office visit: 03*26/2026   Request for Surgical Clearance    Procedure:  insterstim   Date of Surgery:  Clearance 03/15/24                                Surgeon:  Dr. Glendia Macdiarmid Surgeon's Group or Practice Name:  [facility]  Phone number:  [phone] ext. [phone] Fax number:  [phone]   Type of Clearance Requested:   - Medical  - Pharmacy:  Hold Clopidogrel  (Plavix ) 5 days    Type of Anesthesia:  MAC   Additional requests/questions:    Bonney Larraine Salt   02/01/2024, 4:42 PM

## 2024-02-01 NOTE — Telephone Encounter (Signed)
 Dr. Verlin,  Ms./is requesting preoperative cardiac evaluation for InterStim procedure.  She was recently seen by you in clinic on 12/07/2023.  She remained stable from a cardiac standpoint at that time.  Her PMH includes aortic valve stenosis status post TAVR 06/02/2023.  Hypertension, SVT, and anxiety.  Would she be acceptable risk from a cardiac standpoint for planned procedure?  May her Plavix  be held prior to her procedure?  Thank you for your help.  Please direct her response to CVD IV preop pool.  [name]. Jarek Longton NP-C     02/01/2024, 4:55 PM [facility] [address] [place], [place] [address] Office [phone]

## 2024-02-02 NOTE — Telephone Encounter (Signed)
   Name: Cassandra [name]  DOB: 08/19/1955  MRN: [id number]   Primary Cardiologist: Lonni Cash, MD  Chart reviewed as part of pre-operative protocol coverage.   Per Dr. Cash: I think she can proceed with her planned procedure. OK to hold Plavix .   Therefore, based on ACC/AHA guidelines, the patient would be at acceptable risk for the planned procedure without further cardiovascular testing.   I will route this recommendation to the requesting party via Epic fax function and remove from pre-op  pool. Please call with questions.  Jon Garre Arvell Pulsifer, PA 02/02/2024, 11:20 AM

## 2024-02-08 ENCOUNTER — Telehealth: Payer: Self-pay | Admitting: Student in an Organized Health Care Education/Training Program

## 2024-02-08 NOTE — Telephone Encounter (Signed)
 Type of form received: SSBC[employer] Verification Forms  Additional comments:   Received by: Fax  Form should be Faxed/mailed to: (address/ fax #) [phone]  Is patient requesting call for pickup: N/A  Form placed:  Labeled & placed in provider bin  Attach charge sheet.  Provider will determine charge.  Individual made aware of 3-5 business day turn around? N/A

## 2024-02-08 NOTE — Telephone Encounter (Signed)
 Obtained documents and placed on providers desk

## 2024-02-08 NOTE — Progress Notes (Signed)
 [employer] [facility] Weekly Session  Patient Details  Name: Cassandra [name] MRN: [id number] Date of Birth: [birth year] Age: 68 y.o. PCP: Jerrell Cleatus Ned, MD  Vitals:   02/08/24 1337  Weight: 215 lb (97.5 kg)     [employer] Weekly seesion - 02/08/24 1300       [facility] PREP Location   [facility] PREP Location [facility]      Weekly Session   Topic Discussed Other   How Fit and Strong, Fit testing and final assessment information   Classes attended to date [address] 02/08/2024, 1:39 PM

## 2024-02-08 NOTE — Telephone Encounter (Signed)
 Signed and placed in MA basket.  Thank you.

## 2024-02-09 NOTE — Telephone Encounter (Signed)
 Faxed paperwork

## 2024-02-10 NOTE — Progress Notes (Signed)
 [facility] Evaluation  Patient Details  Name: Cassandra [name] MRN: [id number] Date of Birth: July 10, 1955 Age: 68 y.o. PCP: Jerrell Cleatus Ned, MD  Vitals:   02/10/24 1416  BP: 122/70  Pulse: 86  SpO2: 95%  Weight: 214 lb (97.1 kg)     [facility] Eval - 02/10/24 1400       [facility] PRE[facility] Location   [facility] PRE[facility] Location [facility]      Referral    Program Start Date 11/23/23    Program End Date 02/10/24      Measurement   Waist Circumference 51 inches    Waist Circumference End Program 45.5 inches    Hip Circumference 50.75 inches    Hip Circumference End Program 51 inches    Body fat 37.9 percent      Mobility and Daily Activities   I find it easy to walk up or down two or more flights of stairs. 1    I have no trouble taking out the trash. 1    I do housework such as vacuuming and dusting on my own without difficulty. 1    I can easily lift a gallon of milk (8lbs). 1    I can easily walk a mile. 1    I have no trouble reaching into high cupboards or reaching down to pick up something from the floor. 1    I do not have trouble doing out-door work such as loss adjuster, chartered, raking leaves, or gardening. 1      Mobility and Daily Activities   I feel younger than my age. 4    I feel independent. 4    I feel energetic. 4    I live an active life.  4    I feel strong. 4    I feel healthy. 4    I feel active as other people my age. 4      How fit and strong are you.   Fit and Strong Total Score 35         Past Medical History:  Diagnosis Date   Allergy    Anxiety    Complication of anesthesia    difficulty waking up   Depression    GERD (gastroesophageal reflux disease)    HEPATITIS C 12/07/2008   Qualifier: Diagnosis of   By: Shellia MD, Vineet         History of deep venous thrombosis 07/08/2022   History of gross hematuria 02/20/2016   Hypertension    Leukocytoclastic vasculitis (HCC)    Dr. Geronimo and Dr. 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 Verlin & Dr. Maryjane   Severe aortic stenosis    Sleep apnea    cpap   SVT (supraventricular tachycardia)    Type 2 diabetes mellitus with kidney complication, without long-term current use of insulin  (HCC) 05/07/2016   Verrucae vulgaris    Wegner's disease (congenital syphilitic osteochondritis)    Past Surgical History:  Procedure Laterality Date   BUNIONECTOMY Bilateral    CARPAL TUNNEL RELEASE Right 2002   INTRAOPERATIVE TRANSTHORACIC ECHOCARDIOGRAM N/A 06/02/2023   Procedure: ECHOCARDIOGRAM, TRANSTHORACIC;  Surgeon: Verlin Lonni BIRCH, MD;  [facility];  Service: Cardiovascular;  Laterality: N/A;   JOINT REPLACEMENT Right  knee   LOWER EXTREMITY VENOGRAPHY Right 07/14/2016   Procedure: Lower Extremity Venography;  Surgeon: Penne Lonni Colorado, MD;  Location: [facility] INVASIVE CV LAB;  Service: Cardiovascular;  Laterality: Right;   PERCUTANEOUS VENOUS THROMBECTOMY,LYSIS WITH INTRAVASCULAR ULTRASOUND (IVUS) Right 07/17/2016   Procedure: PERCUTANEOUS VENOUS THROMBECTOMY AND LYSIS WITH INTRAVASCULAR ULTRASOUND (IVUS) of right lower extermity with balloon angioplasty;  Surgeon: Penne Lonni Colorado, MD;  Location: [facility] OR;  Service: Vascular;  Laterality: Right;   RIGHT/LEFT HEART CATH AND CORONARY ANGIOGRAPHY N/A 04/10/2023   Procedure: RIGHT/LEFT HEART CATH AND CORONARY ANGIOGRAPHY;  Surgeon: Wendel Lurena POUR, MD;  [facility];  Service: Cardiovascular;  Laterality: N/A;   SVT ABLATION N/A 02/20/2017   Procedure: SVT ABLATION;  Surgeon: Waddell Danelle ORN, MD;  [facility]: [facility] INV[facility] CV LAB;  Service: Cardiovascular;  Laterality: N/A;   TONSILLECTOMY  08/2002   TOTAL KNEE ARTHROPLASTY Right    TOTAL KNEE ARTHROPLASTY Left 02/21/2014   Procedure: LEFT TOTAL KNEE ARTHROPLASTY;   Surgeon: Maude KANDICE Herald, MD;  [facility] OR;  Service: Orthopedics;  Laterality: Left;   TUBAL LIGATION  1980   ULTRASOUND GUIDANCE FOR VASCULAR ACCESS Right 07/17/2016   Procedure: ULTRASOUND GUIDANCE FOR VASCULAR ACCESS;  Surgeon: Penne Lonni Colorado, MD;  Location: [facility] OR;  Service: Vascular;  Laterality: Right;   VENOGRAM Right 07/17/2016   Procedure: right ASCENDING VENOGRAM WITH ANGIOJET;  Surgeon: Penne Lonni Colorado, MD;  Location: [facility] OR;  Service: Vascular;  Laterality: Right;   Social History   Tobacco Use  Smoking Status Never   Passive exposure: Never  Smokeless Tobacco Never  [name] completed the PREP program on 02/10/2024 She attended 11 education classes and 11 exercise classes She lost 6 pounds She lost 6.5 inches Her cardio march increased from 199 to 378 Her arm curls increased from 23 to 26  [facility] 02/10/2024, 2:18 PM

## 2024-02-18 ENCOUNTER — Encounter (HOSPITAL_COMMUNITY): Payer: Self-pay

## 2024-02-18 NOTE — Progress Notes (Addendum)
 Date of COVID positive in last 90 days:  No  PCP - Cleatus Specking, MD Cardiologist - Medford Cash, MD Internal Medicine - Edsel Pepper, [place] Nephrologist - Woodward Brought, MD  Cardiac clearance in Epic dated 02-02-24  Chest x-ray - 05-29-23 Epic EKG - 07-24-23 Epic Stress Test - N/A ECHO - 07-06-23 Epic Cardiac Cath - 04-10-23 Epic Coronary CT - 04-24-23 Epic SVT Ablation - 02-20-17 Epic Pacemaker/ICD device last checked:N/A Spinal Cord Stimulator:N/A Interstim   Bowel Prep - N/A  Sleep Study - Yes, +sleep apnea CPAP - Yes  Fasting Blood Sugar - does not check  Checks Blood Sugar _____ times a day  Last dose of GLP1 agonist-  N/A GLP1 instructions:  Do not take after     Last dose of SGLT-2 inhibitors-  N/A SGLT-2 instructions:  Do not take after    Blood Thinner Instructions: Plavix  (Per patient to hold x5 days) Last dose:   Time: Aspirin  Instructions:N/A Last Dose:  Activity level:  Can go up a flight of stairs and perform activities of daily living without stopping and without symptoms of chest pain or shortness of breath.  Anesthesia review: S/P TAVR, SVUT, HTN, OSA  Patient denies shortness of breath, fever, cough and chest pain at PAT appointment  Patient verbalized understanding of instructions that were given to them at the PAT appointment. Patient was also instructed that they will need to review over the PAT instructions again at home before surgery.

## 2024-02-18 NOTE — Patient Instructions (Addendum)
 SURGICAL WAITING ROOM VISITATION Patients having surgery or a procedure may have no more than 2 support people in the waiting area - these visitors may rotate.    Children under the age of 44 must have an adult with them who is not the patient.  If the patient needs to stay at the hospital during part of their recovery, the visitor guidelines for inpatient rooms apply. Pre-op  nurse will coordinate an appropriate time for 1 support person to accompany patient in pre-op .  This support person may not rotate.    Please refer to the [facility] website for the visitor guidelines for Inpatients (after your surgery is over and you are in a regular room).       Your procedure is scheduled on: 03-15-24   Report to [facility] Main Entrance    Report to admitting at 11:!5 AM   Call this number if you have problems the morning of surgery [phone]   Do not eat food or drink liquids:After Midnight.           If you have questions, please contact your surgeons office.   FOLLOW  ANY ADDITIONAL PRE OP INSTRUCTIONS YOU RECEIVED FROM YOUR SURGEON'S OFFICE!!!     Oral Hygiene is also important to reduce your risk of infection.                                    Remember - BRUSH YOUR TEETH THE MORNING OF SURGERY WITH YOUR REGULAR TOOTHPASTE   Do NOT smoke after Midnight   Take these medicines the morning of surgery with A SIP OF WATER:    Escitalopram    Pantoprazole    Qulipta  Stop all vitamins and herbal supplements 7 days before surgery  Hold Plavix  5 days prior to surgery (do not take after 03-09-24)  Bring CPAP mask and tubing day of surgery.                              You may not have any metal on your body including hair pins, jewelry, and body piercing             Do not wear make-up, lotions, powders, perfumes or deodorant  Do not wear nail polish including gel and S&S, artificial/acrylic nails, or any other type of covering on natural nails including finger and  toenails. If you have artificial nails, gel coating, etc. that needs to be removed by a nail salon please have this removed prior to surgery or surgery may need to be canceled/ delayed if the surgeon/ anesthesia feels like they are unable to be safely monitored.   Do not shave  48 hours prior to surgery.    Do not bring valuables to the hospital. [facility] IS NOT RESPONSIBLE   FOR VALUABLES.   Contacts, dentures or bridgework may not be worn into surgery.  DO NOT BRING YOUR HOME MEDICATIONS TO THE HOSPITAL. PHARMACY WILL DISPENSE MEDICATIONS LISTED ON YOUR MEDICATION LIST TO YOU DURING YOUR ADMISSION IN THE HOSPITAL!    Patients discharged on the day of surgery will not be allowed to drive home.  Someone NEEDS to stay with you for the first 24 hours after anesthesia.              Please read over the following fact sheets you were given: IF YOU HAVE QUESTIONS ABOUT YOUR  PRE-OP  INSTRUCTIONS PLEASE CALL [phone] Gwen  If you received a COVID test during your pre-op  visit  it is requested that you wear a mask when out in public, stay away from anyone that may not be feeling well and notify your surgeon if you develop symptoms. If you test positive for Covid or have been in contact with anyone that has tested positive in the last 10 days please notify you surgeon.  [facility] - Preparing for Surgery Before surgery, you can play an important role.  Because skin is not sterile, your skin needs to be as free of germs as possible.  You can reduce the number of germs on your skin by washing with CHG (chlorahexidine gluconate) soap before surgery.  CHG is an antiseptic cleaner which kills germs and bonds with the skin to continue killing germs even after washing. Please DO NOT use if you have an allergy to CHG or antibacterial soaps.  If your skin becomes reddened/irritated stop using the CHG and inform your nurse when you arrive at Short Stay. Do not shave (including legs and underarms) for at  least 48 hours prior to the first CHG shower.  You may shave your face/neck.  Please follow these instructions carefully:  1.  Shower with CHG Soap the night before surgery ONLY (DO NOT USE THE SOAP THE MORNING OF SURGERY).  2.  If you choose to wash your hair, wash your hair first as usual with your normal  shampoo.  3.  After you shampoo, rinse your hair and body thoroughly to remove the shampoo.                             4.  Use CHG as you would any other liquid soap.  You can apply chg directly to the skin and wash.  Gently with a scrungie or clean washcloth.  5.  Apply the CHG Soap to your body ONLY FROM THE NECK DOWN.   Do   not use on face/ open                           Wound or open sores. Avoid contact with eyes, ears mouth and   genitals (private parts).                       Wash face,  Genitals (private parts) with your normal soap.             6.  Wash thoroughly, paying special attention to the area where your    surgery  will be performed.  7.  Thoroughly rinse your body with warm water from the neck down.  8.  DO NOT shower/wash with your normal soap after using and rinsing off the CHG Soap.                9.  Pat yourself dry with a clean towel.            10.  Wear clean pajamas.            11.  Place clean sheets on your bed the night of your first shower and do not  sleep with pets. Day of Surgery : Do not apply any CHG, lotions/deodorants the morning of surgery.  Please wear clean clothes to the hospital/surgery center.  FAILURE TO FOLLOW THESE INSTRUCTIONS MAY RESULT IN THE CANCELLATION OF YOUR SURGERY  PATIENT SIGNATURE_________________________________  NURSE SIGNATURE__________________________________  __________________________________________________________________________

## 2024-02-24 ENCOUNTER — Other Ambulatory Visit: Payer: Self-pay

## 2024-02-24 ENCOUNTER — Encounter (HOSPITAL_COMMUNITY): Payer: Self-pay

## 2024-02-24 ENCOUNTER — Encounter (HOSPITAL_COMMUNITY)
Admission: RE | Admit: 2024-02-24 | Discharge: 2024-02-24 | Disposition: A | Source: Ambulatory Visit | Attending: Urology

## 2024-02-24 VITALS — BP 116/82 | HR 76 | Temp 98.4°F | Resp 16 | Ht 63.0 in | Wt 215.6 lb

## 2024-02-24 DIAGNOSIS — Z01812 Encounter for preprocedural laboratory examination: Secondary | ICD-10-CM | POA: Insufficient documentation

## 2024-02-24 DIAGNOSIS — Z7902 Long term (current) use of antithrombotics/antiplatelets: Secondary | ICD-10-CM | POA: Insufficient documentation

## 2024-02-24 DIAGNOSIS — D649 Anemia, unspecified: Secondary | ICD-10-CM | POA: Insufficient documentation

## 2024-02-24 DIAGNOSIS — Z8619 Personal history of other infectious and parasitic diseases: Secondary | ICD-10-CM | POA: Insufficient documentation

## 2024-02-24 DIAGNOSIS — E119 Type 2 diabetes mellitus without complications: Secondary | ICD-10-CM

## 2024-02-24 DIAGNOSIS — I10 Essential (primary) hypertension: Secondary | ICD-10-CM | POA: Insufficient documentation

## 2024-02-24 DIAGNOSIS — N3281 Overactive bladder: Secondary | ICD-10-CM | POA: Insufficient documentation

## 2024-02-24 HISTORY — DX: Anemia, unspecified: D64.9

## 2024-02-24 LAB — CBC
HCT: 38.1 % (ref 36.0–46.0)
Hemoglobin: 12.7 g/dL (ref 12.0–15.0)
MCH: 32.6 pg (ref 26.0–34.0)
MCHC: 33.3 g/dL (ref 30.0–36.0)
MCV: 97.7 fL (ref 80.0–100.0)
Platelets: 143 K/uL — ABNORMAL LOW (ref 150–400)
RBC: 3.9 MIL/uL (ref 3.87–5.11)
RDW: 12.9 % (ref 11.5–15.5)
WBC: 5 K/uL (ref 4.0–10.5)
nRBC: 0 % (ref 0.0–0.2)

## 2024-02-24 LAB — HEMOGLOBIN A1C
Hgb A1c MFr Bld: 5.2 % (ref 4.8–5.6)
Mean Plasma Glucose: 102.54 mg/dL

## 2024-02-24 LAB — GLUCOSE, CAPILLARY: Glucose-Capillary: 115 mg/dL — ABNORMAL HIGH (ref 70–99)

## 2024-02-25 ENCOUNTER — Other Ambulatory Visit: Payer: Self-pay | Admitting: Cardiovascular Disease

## 2024-02-28 NOTE — Anesthesia Preprocedure Evaluation (Addendum)
"                                    Anesthesia Evaluation  Patient identified by MRN, date of birth, ID band Patient awake    Reviewed: Allergy & Precautions, NPO status , Patient's Chart, lab work & pertinent test results, reviewed documented beta blocker date and time   History of Anesthesia Complications (+) PONV and history of anesthetic complications  Airway Mallampati: II  TM Distance: >3 FB     Dental  (+) Missing, Poor Dentition   Pulmonary sleep apnea and Continuous Positive Airway Pressure Ventilation , neg COPD   breath sounds clear to auscultation       Cardiovascular hypertension, + dysrhythmias Supra Ventricular Tachycardia + Valvular Problems/Murmurs (s/p TAVR) AS  Rhythm:Regular Rate:Normal  MPRESSIONS     1. Left ventricular ejection fraction, by estimation, is 60 to 65%. The  left ventricle has normal function. The left ventricle has no regional  wall motion abnormalities. There is mild left ventricular hypertrophy.  Left ventricular diastolic parameters  were normal.   2. Right ventricular systolic function is normal. The right ventricular  size is mildly enlarged.   3. Left atrial size was mildly dilated.   4. The mitral valve is grossly normal. Mild mitral valve regurgitation.  No evidence of mitral stenosis.   5. 26 mm Evolut Pro. Mild PVL. Normal gradients. The aortic valve has  been repaired/replaced. Aortic valve regurgitation is mild. Procedure  Date: 06/02/2023. Aortic valve area, by VTI measures 1.52 cm. Aortic valve  mean gradient measures 8.0 mmHg.  Aortic valve Vmax measures 1.90 m/s.      Neuro/Psych  Headaches, neg Seizures PSYCHIATRIC DISORDERS Anxiety Depression       GI/Hepatic ,GERD  ,,(+) neg Cirrhosis        Endo/Other  diabetes, Type 2  Class 3 obesity  Renal/GU Renal disease     Musculoskeletal  (+) Arthritis , Osteoarthritis,    Abdominal   Peds  Hematology  (+) Blood dyscrasia, anemia Mild  thrombocytopenia   Anesthesia Other Findings   Reproductive/Obstetrics                              Anesthesia Physical Anesthesia Plan  ASA: 3  Anesthesia Plan: MAC   Post-op Pain Management:    Induction: Intravenous  PONV Risk Score and Plan: 2 and Ondansetron  and Propofol  infusion  Airway Management Planned: Natural Airway and Simple Face Mask  Additional Equipment:   Intra-op Plan:   Post-operative Plan:   Informed Consent: I have reviewed the patients History and Physical, chart, labs and discussed the procedure including the risks, benefits and alternatives for the proposed anesthesia with the patient or authorized representative who has indicated his/her understanding and acceptance.     Dental advisory given  Plan Discussed with: CRNA  Anesthesia Plan Comments: (See PAT note from 12/10)         Anesthesia Quick Evaluation  "

## 2024-02-28 NOTE — Progress Notes (Signed)
 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  Prior anesthesia complications of prolonged emergence.   Patient follows with Cardiology for hx of SVT s/p ablation in 2018 and severe AS s/p TAVR in 05/2023. Echo on April 2025 with normal LV function. AVR working well with mild PVL. Negative scan for amyloid in August 2025. She is on Plavix  for hx of autoimmune vasculitis disease. Last seen by Dr. Verlin on 12/07/23 and noted to be doing well. She was cleared for surgery (see 11/18 telephone encounter:  Per Dr. Verlin: I think she can proceed with her planned procedure. OK to hold Plavix .  Hx of glomerulonephritis and follows at [facility]. Last seen 11/18. Baseline Cr ~1.0.  Hx of Hep C. Antibody positive but quantitative viral count undetectable.  LD Plavix : 12/24   VS: BP 116/82   Pulse 76   Temp 36.9 C (Oral)   Resp 16   Ht 5' 3 (1.6 m)   Wt 97.8 kg   SpO2 95%   BMI 38.19 kg/m   PROVIDERS: Jerrell Cleatus Ned, MD Cardiologist - Medford Verlin, MD Internal Medicine - Edsel Pepper, [place] Nephrologist - Woodward Brought, MD  LABS: Labs reviewed: Acceptable for surgery. (all labs ordered are listed, but only abnormal results are displayed)  Labs Reviewed  CBC - Abnormal; Notable for the following components:      Result Value   Platelets 143 (*)    All other components within normal limits  GLUCOSE, CAPILLARY - Abnormal; Notable for the following  components:   Glucose-Capillary 115 (*)    All other components within normal limits  HEMOGLOBIN A1C     CT Chest 10/20/23:  IMPRESSION: 1. Moderate size hiatal hernia. 2. Aortic atherosclerosis.   Aortic Atherosclerosis (ICD10-I70.0).  EKG 07/24/23:  Sinus rhythm Low voltage  Echo 07/06/23:  IMPRESSIONS    1. Left ventricular ejection fraction, by estimation, is 60 to 65%. The left ventricle has normal function. The left ventricle has no regional wall motion abnormalities. There is mild left ventricular hypertrophy. Left ventricular diastolic parameters were normal.  2. Right ventricular systolic function is normal. The right ventricular size is mildly enlarged.  3. Left atrial size was mildly dilated.  4. The mitral valve is grossly normal. Mild mitral valve regurgitation. No evidence of mitral stenosis.  5. 26 mm Evolut Pro. Mild PVL. Normal gradients. The aortic valve has been repaired/replaced. Aortic valve regurgitation is mild. Procedure Date: 06/02/2023. Aortic valve area, by VTI measures 1.52 cm. Aortic valve mean gradient measures 8.0 mmHg. Aortic valve Vmax measures 1.90 m/s.  Comparison(s): No significant change from prior study. Past Medical History:  Diagnosis Date   Allergy    Anemia    Anxiety    Complication of anesthesia    difficulty waking up   Depression    GERD (gastroesophageal reflux disease)    HEPATITIS C 12/07/2008   Qualifier: Diagnosis of   By: Shellia MD, Vineet  History of deep venous thrombosis 07/08/2022   History of gross hematuria 02/20/2016   Hypertension    Leukocytoclastic vasculitis (HCC)    Dr. Geronimo and Dr. 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 Verlin & Dr. Maryjane   Severe aortic stenosis    Sleep apnea    cpap   SVT  (supraventricular tachycardia)    Type 2 diabetes mellitus with kidney complication, without long-term current use of insulin  (HCC) 05/07/2016   Verrucae vulgaris    Wegner's disease (congenital syphilitic osteochondritis)     Past Surgical History:  Procedure Laterality Date   BUNIONECTOMY Bilateral    CARPAL TUNNEL RELEASE Right 2002   INTRAOPERATIVE TRANSTHORACIC ECHOCARDIOGRAM N/A 06/02/2023   Procedure: ECHOCARDIOGRAM, TRANSTHORACIC;  Surgeon: Verlin Lonni BIRCH, MD;  [facility] INVASIVE CV LAB;  Service: Cardiovascular;  Laterality: N/A;   JOINT REPLACEMENT Right    knee   LOWER EXTREMITY VENOGRAPHY Right 07/14/2016   Procedure: Lower Extremity Venography;  Surgeon: Penne Lonni Colorado, MD;  Location: [facility] INVASIVE CV LAB;  Service: Cardiovascular;  Laterality: Right;   PERCUTANEOUS VENOUS THROMBECTOMY,LYSIS WITH INTRAVASCULAR ULTRASOUND (IVUS) Right 07/17/2016   Procedure: PERCUTANEOUS VENOUS THROMBECTOMY AND LYSIS WITH INTRAVASCULAR ULTRASOUND (IVUS) of right lower extermity with balloon angioplasty;  Surgeon: Penne Lonni Colorado, MD;  Location[facility] OR;  Service: Vascular;  Laterality: Right;   RIGHT/LEFT HEART CATH AND CORONARY ANGIOGRAPHY N/A 04/10/2023   Procedure: RIGHT/LEFT HEART CATH AND CORONARY ANGIOGRAPHY;  Surgeon: Wendel Lurena POUR, MD;  [facility]: [facility] INVASIVE CV LAB;  Service: Cardiovascular;  Laterality: N/A;   SVT ABLATION N/A 02/20/2017   Procedure: SVT ABLATION;  Surgeon: Waddell Danelle ORN, MD;  [facility]: [facility] INVASIVE CV LAB;  Service: Cardiovascular;  Laterality: N/A;   TONSILLECTOMY  08/2002   TOTAL KNEE ARTHROPLASTY Right    TOTAL KNEE ARTHROPLASTY Left 02/21/2014   Procedure: LEFT TOTAL KNEE ARTHROPLASTY;  Surgeon: Maude KANDICE Herald, MD;  Location: [facility] OR;  Service: Orthopedics;  Laterality: Left;   TUBAL LIGATION  1980   ULTRASOUND GUIDANCE FOR VASCULAR ACCESS Right 07/17/2016   Procedure: ULTRASOUND GUIDANCE FOR VASCULAR ACCESS;  Surgeon: Penne Lonni Colorado, MD;  Location: [facility] OR;  Service: Vascular;  Laterality: Right;   VENOGRAM Right 07/17/2016   Procedure: right ASCENDING VENOGRAM WITH ANGIOJET;  Surgeon: Penne Lonni Colorado, MD;  Location: [facility] OR;  Service: Vascular;  Laterality: Right;    MEDICATIONS:  alendronate (FOSAMAX) 70 MG tablet   atorvastatin  (LIPITOR) 20 MG tablet   clobetasol  (TEMOVATE ) 0.05 % external solution   clopidogrel  (PLAVIX ) 75 MG tablet   escitalopram  (LEXAPRO ) 20 MG tablet   ipratropium (ATROVENT ) 0.06 % nasal spray   montelukast  (SINGULAIR ) 10 MG tablet   pantoprazole  (PROTONIX ) 40 MG tablet   QULIPTA 60 MG TABS   Rimegepant Sulfate (NURTEC) 75 MG TBDP   telmisartan (MICARDIS) 20 MG tablet   No current facility-administered medications for this encounter.   Burnard CHRISTELLA Odis DEVONNA [facility] Short Stay/Anesthesiology [facility] Phone [phone] 02/28/2024 8:00 PM

## 2024-03-15 ENCOUNTER — Encounter (HOSPITAL_COMMUNITY)
Admission: RE | Disposition: A | Discharge status: Home / Self Care | Payer: Self-pay | Source: Ambulatory Visit | Attending: Urology

## 2024-03-15 ENCOUNTER — Encounter (HOSPITAL_COMMUNITY): Payer: Self-pay | Admitting: Urology

## 2024-03-15 ENCOUNTER — Ambulatory Visit (HOSPITAL_COMMUNITY)
Admission: RE | Admit: 2024-03-15 | Discharge: 2024-03-15 | Disposition: A | Discharge status: Home / Self Care | Source: Ambulatory Visit | Attending: Urology | Admitting: Urology

## 2024-03-15 ENCOUNTER — Ambulatory Visit (HOSPITAL_COMMUNITY): Payer: Self-pay | Admitting: Anesthesiology

## 2024-03-15 ENCOUNTER — Ambulatory Visit (HOSPITAL_COMMUNITY): Payer: Self-pay | Admitting: Medical

## 2024-03-15 ENCOUNTER — Ambulatory Visit (HOSPITAL_COMMUNITY)

## 2024-03-15 ENCOUNTER — Other Ambulatory Visit: Payer: Self-pay

## 2024-03-15 DIAGNOSIS — K219 Gastro-esophageal reflux disease without esophagitis: Secondary | ICD-10-CM | POA: Insufficient documentation

## 2024-03-15 DIAGNOSIS — E119 Type 2 diabetes mellitus without complications: Secondary | ICD-10-CM | POA: Insufficient documentation

## 2024-03-15 DIAGNOSIS — I080 Rheumatic disorders of both mitral and aortic valves: Secondary | ICD-10-CM | POA: Diagnosis not present

## 2024-03-15 DIAGNOSIS — F418 Other specified anxiety disorders: Secondary | ICD-10-CM

## 2024-03-15 DIAGNOSIS — Z6838 Body mass index (BMI) 38.0-38.9, adult: Secondary | ICD-10-CM | POA: Diagnosis not present

## 2024-03-15 DIAGNOSIS — G4730 Sleep apnea, unspecified: Secondary | ICD-10-CM | POA: Insufficient documentation

## 2024-03-15 DIAGNOSIS — N3281 Overactive bladder: Secondary | ICD-10-CM | POA: Diagnosis present

## 2024-03-15 DIAGNOSIS — I10 Essential (primary) hypertension: Secondary | ICD-10-CM | POA: Insufficient documentation

## 2024-03-15 DIAGNOSIS — E66813 Obesity, class 3: Secondary | ICD-10-CM | POA: Diagnosis not present

## 2024-03-15 DIAGNOSIS — N3941 Urge incontinence: Secondary | ICD-10-CM | POA: Diagnosis not present

## 2024-03-15 DIAGNOSIS — M1990 Unspecified osteoarthritis, unspecified site: Secondary | ICD-10-CM | POA: Insufficient documentation

## 2024-03-15 HISTORY — PX: INTERSTIM-GENERATOR CHANGE: SHX6642

## 2024-03-15 LAB — BASIC METABOLIC PANEL WITH GFR
Anion gap: 8 (ref 5–15)
BUN: 16 mg/dL (ref 8–23)
CO2: 26 mmol/L (ref 22–32)
Calcium: 9.1 mg/dL (ref 8.9–10.3)
Chloride: 106 mmol/L (ref 98–111)
Creatinine, Ser: 0.94 mg/dL (ref 0.44–1.00)
GFR, Estimated: >60 mL/min
Glucose, Bld: 95 mg/dL (ref 70–99)
Potassium: 3.9 mmol/L (ref 3.5–5.1)
Sodium: 140 mmol/L (ref 135–145)

## 2024-03-15 LAB — GLUCOSE, CAPILLARY: Glucose-Capillary: 120 mg/dL — ABNORMAL HIGH (ref 70–99)

## 2024-03-15 SURGERY — REPLACEMENT, PULSE GENERATOR, SACRAL NERVE STIMULATOR, INTERSTIM
Anesthesia: Monitor Anesthesia Care | Site: Back

## 2024-03-15 MED ORDER — ACETAMINOPHEN 10 MG/ML IV SOLN: 1000.0000 mg | Freq: Once | INTRAVENOUS | Status: DC | PRN

## 2024-03-15 MED ORDER — VANCOMYCIN HCL IN DEXTROSE 1-5 GM/200ML-% IV SOLN
1000.0000 mg | INTRAVENOUS | Status: AC
  Administered 2024-03-15: 1000 mg via INTRAVENOUS
  Filled 2024-03-15: qty 200

## 2024-03-15 MED ORDER — BUPIVACAINE-EPINEPHRINE 0.5% -1:200000 IJ SOLN
INTRAMUSCULAR | Status: DC | PRN
  Administered 2024-03-15: 28 mL

## 2024-03-15 MED ORDER — ORAL CARE MOUTH RINSE: 15.0000 mL | Freq: Once | OROMUCOSAL | Status: AC

## 2024-03-15 MED ORDER — CEPHALEXIN 500 MG PO CAPS: 500.0000 mg | ORAL_CAPSULE | Freq: Three times a day (TID) | ORAL | 0 refills | Status: DC

## 2024-03-15 MED ORDER — MIDAZOLAM HCL 2 MG/2ML IJ SOLN
INTRAMUSCULAR | Status: AC
  Filled 2024-03-15: qty 2

## 2024-03-15 MED ORDER — PROPOFOL 10 MG/ML IV BOLUS
INTRAVENOUS | Status: AC
  Filled 2024-03-15: qty 20

## 2024-03-15 MED ORDER — ONDANSETRON HCL 4 MG/2ML IJ SOLN
INTRAMUSCULAR | Status: AC
  Filled 2024-03-15: qty 2

## 2024-03-15 MED ORDER — LIDOCAINE-EPINEPHRINE (PF) 1 %-1:200000 IJ SOLN
INTRAMUSCULAR | Status: AC
  Filled 2024-03-15: qty 30

## 2024-03-15 MED ORDER — OXYCODONE HCL 5 MG PO TABS: 5.0000 mg | ORAL_TABLET | Freq: Once | ORAL | Status: DC | PRN

## 2024-03-15 MED ORDER — STERILE WATER FOR IRRIGATION IR SOLN
Status: DC | PRN
  Administered 2024-03-15: 1000 mL

## 2024-03-15 MED ORDER — HYDROCODONE-ACETAMINOPHEN 5-325 MG PO TABS: 1.0000 | ORAL_TABLET | Freq: Four times a day (QID) | ORAL | 0 refills | Status: DC | PRN

## 2024-03-15 MED ORDER — ONDANSETRON HCL 4 MG/2ML IJ SOLN: 4.0000 mg | Freq: Once | INTRAMUSCULAR | Status: DC | PRN

## 2024-03-15 MED ORDER — PROPOFOL 500 MG/50ML IV EMUL
INTRAVENOUS | Status: DC | PRN
  Administered 2024-03-15 (×2): 50 mg via INTRAVENOUS
  Administered 2024-03-15: 100 ug/kg/min via INTRAVENOUS
  Administered 2024-03-15: 30 mg via INTRAVENOUS

## 2024-03-15 MED ORDER — DEXAMETHASONE SOD PHOSPHATE PF 10 MG/ML IJ SOLN
INTRAMUSCULAR | Status: DC | PRN
  Administered 2024-03-15: 8 mg via INTRAVENOUS

## 2024-03-15 MED ORDER — LACTATED RINGERS IV SOLN: INTRAVENOUS | Status: DC

## 2024-03-15 MED ORDER — PROPOFOL 1000 MG/100ML IV EMUL
INTRAVENOUS | Status: AC
  Filled 2024-03-15: qty 100

## 2024-03-15 MED ORDER — CHLORHEXIDINE GLUCONATE 0.12 % MT SOLN
15.0000 mL | Freq: Once | OROMUCOSAL | Status: AC
  Administered 2024-03-15: 15 mL via OROMUCOSAL

## 2024-03-15 MED ORDER — BUPIVACAINE-EPINEPHRINE (PF) 0.5% -1:200000 IJ SOLN
INTRAMUSCULAR | Status: AC
  Filled 2024-03-15: qty 30

## 2024-03-15 MED ORDER — FENTANYL CITRATE (PF) 50 MCG/ML IJ SOSY: 25.0000 ug | PREFILLED_SYRINGE | INTRAMUSCULAR | Status: DC | PRN

## 2024-03-15 MED ORDER — OXYCODONE HCL 5 MG/5ML PO SOLN: 5.0000 mg | Freq: Once | ORAL | Status: DC | PRN

## 2024-03-15 SURGICAL SUPPLY — 37 items
BAG COUNTER SPONGE SURGICOUNT (BAG) IMPLANT
BENZOIN TINCTURE PRP APPL 2/3 (GAUZE/BANDAGES/DRESSINGS) IMPLANT
BLADE SURG 15 STRL LF DISP TIS (BLADE) ×1 IMPLANT
CHLORAPREP W/TINT 26 (MISCELLANEOUS) ×1 IMPLANT
COVER PROBE U/S 5X48 (MISCELLANEOUS) IMPLANT
DERMABOND ADVANCED .7 DNX12 (GAUZE/BANDAGES/DRESSINGS) IMPLANT
DRAPE C-ARM 42X120 X-RAY (DRAPES) ×1 IMPLANT
DRAPE C-ARMOR (DRAPES) ×1 IMPLANT
DRAPE INCISE 23X17 STRL (DRAPES) ×1 IMPLANT
DRAPE LAPAROSCOPIC ABDOMINAL (DRAPES) ×1 IMPLANT
DRAPE SHEET LG 3/4 BI-LAMINATE (DRAPES) IMPLANT
DRAPE UTILITY XL STRL (DRAPES) IMPLANT
DRSG TEGADERM 2-3/8X2-3/4 SM (GAUZE/BANDAGES/DRESSINGS) ×1 IMPLANT
DRSG TEGADERM 4X4.75 (GAUZE/BANDAGES/DRESSINGS) ×1 IMPLANT
DRSG TELFA 3X8 NADH STRL (GAUZE/BANDAGES/DRESSINGS) IMPLANT
GAUZE SPONGE 4X4 12PLY STRL (GAUZE/BANDAGES/DRESSINGS) ×1 IMPLANT
GLOVE SURG LX STRL 7.5 STRW (GLOVE) ×2 IMPLANT
GOWN STRL REUS W/ TWL XL LVL3 (GOWN DISPOSABLE) ×1 IMPLANT
KIT BASIN OR (CUSTOM PROCEDURE TRAY) ×1 IMPLANT
KIT HANDSET INTERSTIM COMM (NEUROSURGERY SUPPLIES) IMPLANT
LEAD INTERSTIM 4.32 28 L (Lead) IMPLANT
NEEDLE FORAMN 5 20GA (NEUROSURGERY SUPPLIES) IMPLANT
NEEDLE HYPO 25X1 1.5 SAFETY (NEEDLE) ×1 IMPLANT
NEUROSTIMULATOR 1.7X2X.06 (UROLOGICAL SUPPLIES) IMPLANT
NS IRRIG 1000ML POUR BTL (IV SOLUTION) ×1 IMPLANT
PACK BASIC VI WITH GOWN DISP (CUSTOM PROCEDURE TRAY) ×1 IMPLANT
PENCIL SMOKE EVACUATOR (MISCELLANEOUS) IMPLANT
SPIKE FLUID TRANSFER (MISCELLANEOUS) ×1 IMPLANT
STIMULATOR INTERSTIM 2X1.7X.3 (Miscellaneous) IMPLANT
SUT MNCRL AB 4-0 PS2 18 (SUTURE) ×2 IMPLANT
SUT SILK 2 0 SH (SUTURE) IMPLANT
SUT SILK 2-0 18XBRD TIE 12 (SUTURE) IMPLANT
SUT VIC AB 2-0 UR6 27 (SUTURE) ×2 IMPLANT
SUT VIC AB 3-0 SH 27X BRD (SUTURE) ×1 IMPLANT
SYR CONTROL 10ML LL (SYRINGE) ×1 IMPLANT
TOWEL OR DSP ST BLU DLX 10/PK (DISPOSABLE) ×1 IMPLANT
WATER STERILE IRR 1000ML POUR (IV SOLUTION) ×1 IMPLANT

## 2024-03-15 NOTE — Discharge Instructions (Addendum)
 Remove dressing on Saturday No heavy straining for 2 weeks okay to showerI have reviewed discharge instructions in detail with the patient. They will follow-up with me or their physician as scheduled. My nurse will also be calling the patients as per protocol.

## 2024-03-15 NOTE — Anesthesia Postprocedure Evaluation (Signed)
"   Anesthesia Post Note  Patient: Cassandra [name]  Procedure(s) Performed: REPLACEMENT, PULSE GENERATOR, SACRAL NERVE STIMULATOR, INTERSTIM (Back)     Patient location during evaluation: PACU Anesthesia Type: MAC Level of consciousness: awake and alert Pain management: pain level controlled Vital Signs Assessment: post-procedure vital signs reviewed and stable Respiratory status: spontaneous breathing, nonlabored ventilation, respiratory function stable and patient connected to nasal cannula oxygen Cardiovascular status: blood pressure returned to baseline and stable Postop Assessment: no apparent nausea or vomiting Anesthetic complications: no   No notable events documented.  Last Vitals:  Vitals:   03/15/24 1445 03/15/24 1455  BP: (!) 133/58 (!) 134/58  Pulse: 61 (!) 57  Resp: 17 20  Temp: (!) 36.4 C (!) 36.4 C  SpO2: 97% 96%    Last Pain:  Vitals:   03/15/24 1455  TempSrc:   PainSc: 0-No pain                 Lynwood MARLA Cornea      "

## 2024-03-15 NOTE — Anesthesia Procedure Notes (Signed)
 Procedure Name: MAC Date/Time: 03/15/2024 1:21 PM  Performed by: Nada Corean CROME, CRNAPre-anesthesia Checklist: Patient identified, Emergency Drugs available, Suction available, Patient being monitored and Timeout performed Patient Re-evaluated:Patient Re-evaluated prior to induction Oxygen Delivery Method: Simple face mask Preoxygenation: Pre-oxygenation with 100% oxygen Induction Type: IV induction Ventilation: Nasal airway inserted- appropriate to patient size Placement Confirmation: positive ETCO2 Dental Injury: Teeth and Oropharynx as per pre-operative assessment

## 2024-03-15 NOTE — Op Note (Signed)
 Preoperative diagnosis: Refractory urgency incontinence Diagnosis refractory urgency incontinence Surgery: Placement of InterStim stage I and stage II and impedance 7 Surgeon: Dr. Glendia Chalice Philbert  Reported patient was prepped in the prone position.  Preoperative antibiotics were given.  Extra care was taken with positioning and preparation  It was easy to mark the S3 foramen bilaterally fluoroscopically 10 cc of lidocaine  epinephrine  mixture was used on the right.  It was easy to find the S3 foramina on the right and is very medial and straight.  Inner aspect of foramen needle was removed.  Guide was placed to the appropriate depth.  White trocar was placed through the left make a scalp incision 1 cm.  Well-prepared needle was placed the appropriate depth.  I placed a 3 times until the angle looks excellent.  Very pleased with the fluoroscopic views AP and lateral.  She had very good toe responses at all lower settings and good bellows response at setting 1 2 and 3.  They were even better than the peripheral nerve evaluation where she had decreased sensations at amplitude 2 in spite of excellent placement  I used soft tissue and bony landmarks to mark a 4 and a centimeter right buttock incision.  15 cc of lidocaine  epinephrine  mixture was used.  Scalpel.  I dissected down to the appropriate depth with cautery.  I finger dissected the device pocket  It was brought from medial to lateral described technique.  He was placed in the IPG with screwdriver.  IPG was placed in pocket tension-free.  As a separate procedure impedance was checked and normal in all 14 positions  Right midline incision was closed with interrupted 4-0 Vicryl.  Right buttock incision was closed with 3 mL Vicryl subcutaneous followed by chromic.  Sterile dressings applied.  Hopefully the patient reaches her treatment goal

## 2024-03-15 NOTE — Interval H&P Note (Signed)
 History and Physical Interval Note:  03/15/2024 10:40 AM  Cassandra [name]  has presented today for surgery, with the diagnosis of OVERACTIVE BLADDER.  The various methods of treatment have been discussed with the patient and family. After consideration of risks, benefits and other options for treatment, the patient has consented to  Procedures: REPLACEMENT, PULSE GENERATOR, SACRAL NERVE STIMULATOR, INTERSTIM (N/A) as a surgical intervention.  The patient's history has been reviewed, patient examined, no change in status, stable for surgery.  I have reviewed the patient's chart and labs.  Questions were answered to the patient's satisfaction.     Donicia Druck A Juanda Luba

## 2024-03-15 NOTE — Transfer of Care (Signed)
 Immediate Anesthesia Transfer of Care Note  Patient: Cassandra [name]  Procedure(s) Performed: REPLACEMENT, PULSE GENERATOR, SACRAL NERVE STIMULATOR, INTERSTIM (Back)  Patient Location: PACU  Anesthesia Type:MAC  Level of Consciousness: awake, alert , oriented, and patient cooperative  Airway & Oxygen Therapy: Patient Spontanous Breathing  Post-op Assessment: Report given to RN and Post -op Vital signs reviewed and stable  Post vital signs: Reviewed and stable  Last Vitals:  Vitals Value Taken Time  BP 115/60 03/15/24 14:17  Temp    Pulse 73 03/15/24 14:20  Resp 17 03/15/24 14:20  SpO2 96 % 03/15/24 14:20  Vitals shown include unfiled device data.  Last Pain:  Vitals:   03/15/24 1242  TempSrc:   PainSc: 0-No pain         Complications: No notable events documented.

## 2024-03-16 ENCOUNTER — Encounter (HOSPITAL_COMMUNITY): Payer: Self-pay | Admitting: Urology

## 2024-03-24 ENCOUNTER — Ambulatory Visit (INDEPENDENT_AMBULATORY_CARE_PROVIDER_SITE_OTHER): Admitting: *Deleted

## 2024-03-24 VITALS — Ht 63.0 in | Wt 215.0 lb

## 2024-03-24 DIAGNOSIS — Z0000 Encounter for general adult medical examination without abnormal findings: Secondary | ICD-10-CM | POA: Diagnosis not present

## 2024-03-24 NOTE — Progress Notes (Signed)
 "  Chief Complaint  Patient presents with   Medicare Wellness     Subjective:   Cassandra [name] is a 69 y.o. female who presents for a Medicare Annual Wellness Visit.  No voiced or noted concerns at this time Patient advised to keep follow-up appointment with PCP (04-12-2024)   Visit info / Clinical Intake: Medicare Wellness Visit Type:: Subsequent Annual Wellness Visit Persons participating in visit and providing information:: patient Medicare Wellness Visit Mode:: Telephone If telephone:: video declined Since this visit was completed virtually, some vitals may be partially provided or unavailable. Missing vitals are due to the limitations of the virtual format.: Unable to obtain vitals - no equipment If Telephone or Video please confirm:: I connected with patient using audio/video enable telemedicine. I verified patient identity with two identifiers, discussed telehealth limitations, and patient agreed to proceed. Patient Location:: home Provider Location:: home Interpreter Needed?: No Pre-visit prep was completed: no AWV questionnaire completed by patient prior to visit?: no Living arrangements:: (!) lives alone Patient's Overall Health Status Rating: good Typical amount of pain: none Does pain affect daily life?: no Are you currently prescribed opioids?: (!) yes  Dietary Habits and Nutritional Risks How many meals a day?: 3 Eats fruit and vegetables daily?: yes Most meals are obtained by: preparing own meals; eating out In the last 2 weeks, have you had any of the following?: none Diabetic:: no  Functional Status Activities of Daily Living (to include ambulation/medication): Independent Ambulation: Independent Medication Administration: Independent Home Management (perform basic housework or laundry): Independent Manage your own finances?: yes Primary transportation is: driving Concerns about vision?: no *vision screening is required for WTM* Concerns about hearing?:  no  Fall Screening Falls in the past year?: 0 Number of falls in past year: 0 Was there an injury with Fall?: 0 Fall Risk Category Calculator: 0 Patient Fall Risk Level: Low Fall Risk  Fall Risk Patient at Risk for Falls Due to: No Fall Risks Fall risk Follow up: Falls evaluation completed; Education provided; Falls prevention discussed  Home and Transportation Safety: All rugs have non-skid backing?: (!) no All stairs or steps have railings?: (!) no Grab bars in the bathtub or shower?: yes Have non-skid surface in bathtub or shower?: (!) no Good home lighting?: yes Regular seat belt use?: yes Hospital stays in the last year:: no  Cognitive Assessment Difficulty concentrating, remembering, or making decisions? : no Will 6CIT or Mini Cog be Completed: yes What year is it?: 0 points What month is it?: 0 points Give patient an address phrase to remember (5 components): its very sunny outside today in January About what time is it?: 0 points Count backwards from 20 to 1: 0 points Say the months of the year in reverse: 0 points Repeat the address phrase from earlier: 0 points 6 CIT Score: 0 points  Advance Directives (For Healthcare) Does Patient Have a Medical Advance Directive?: Yes Does patient want to make changes to medical advance directive?: No - Patient declined Type of Advance Directive: Healthcare Power of Attorney Copy of Healthcare Power of Attorney in Chart?: No - copy requested  Reviewed/Updated  Reviewed/Updated: Reviewed All (Medical, Surgical, Family, Medications, Allergies, Care Teams, Patient Goals); Surgical History; Family History; Medications; Allergies; Care Teams; Patient Goals; Medical History    Allergies (verified) Nitrofurantoin , Effexor [venlafaxine], Myrbetriq [mirabegron], Nsaids, Reglan [metoclopramide], Clarithromycin, Flagyl [metronidazole], Ketoprofen, Levofloxacin , Paxil [paroxetine], Tolterodine, Ampicillin, Cholecalciferol , Codeine,  Fluoxetine hcl, Sulfa antibiotics, and Tetracycline   Current Medications (verified) Outpatient Encounter Medications  as of 03/24/2024  Medication Sig   alendronate (FOSAMAX) 70 MG tablet Take 70 mg by mouth every Sunday. Take with a full glass of water  on an empty stomach.   atorvastatin  (LIPITOR) 20 MG tablet Take 20 mg by mouth at bedtime.   clobetasol  (TEMOVATE ) 0.05 % external solution Apply 1 Application topically 2 (two) times daily. Apply to itchy areas of scalp   clopidogrel  (PLAVIX ) 75 MG tablet Take 1 tablet (75 mg total) by mouth daily.   escitalopram  (LEXAPRO ) 20 MG tablet Take 20 mg by mouth in the morning.   HYDROcodone -acetaminophen  (NORCO/VICODIN) 5-325 MG tablet Take 1-2 tablets by mouth every 6 (six) hours as needed for moderate pain (pain score 4-6).   ipratropium (ATROVENT ) 0.06 % nasal spray Place 1 spray into both nostrils at bedtime.   montelukast  (SINGULAIR ) 10 MG tablet Take 10 mg by mouth at bedtime.   pantoprazole  (PROTONIX ) 40 MG tablet Take 40 mg by mouth daily.   QULIPTA 60 MG TABS Take 1 tablet by mouth daily.   Rimegepant Sulfate (NURTEC) 75 MG TBDP Take 75 mg by mouth daily as needed (for migraines).   telmisartan (MICARDIS) 20 MG tablet Take 20 mg by mouth in the morning.   cephALEXin  (KEFLEX ) 500 MG capsule Take 1 capsule (500 mg total) by mouth 3 (three) times daily. (Patient not taking: Reported on 03/24/2024)   No facility-administered encounter medications on file as of 03/24/2024.    History: Past Medical History:  Diagnosis Date   Allergy    Anemia    Anxiety    Complication of anesthesia    difficulty waking up   Depression    GERD (gastroesophageal reflux disease)    HEPATITIS C 12/07/2008   Qualifier: Diagnosis of   By: Shellia MD, Vineet         History of deep venous thrombosis 07/08/2022   History of gross hematuria 02/20/2016   Hypertension    Leukocytoclastic vasculitis (HCC)    Dr. Geronimo and Dr. 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 Verlin & Dr. Maryjane   Severe aortic stenosis    Sleep apnea    cpap   SVT (supraventricular tachycardia)    Type 2 diabetes mellitus with kidney complication, without long-term current use of insulin  (HCC) 05/07/2016   Verrucae vulgaris    Wegner's disease (congenital syphilitic osteochondritis)    Past Surgical History:  Procedure Laterality Date   BUNIONECTOMY Bilateral    CARPAL TUNNEL RELEASE Right 2002   INTERSTIM-GENERATOR CHANGE N/A 03/15/2024   Procedure: REPLACEMENT, PULSE GENERATOR, SACRAL NERVE STIMULATOR, INTERSTIM;  Surgeon: Gaston Hamilton, MD;  Location: [facility] ORS;  Service: Urology;  Laterality: N/A;   INTRAOPERATIVE TRANSTHORACIC ECHOCARDIOGRAM N/A 06/02/2023   Procedure: ECHOCARDIOGRAM, TRANSTHORACIC;  Surgeon: Verlin Lonni BIRCH, MD;  Location: [facility] INVASIVE CV LAB;  Service: Cardiovascular;  Laterality: N/A;   JOINT REPLACEMENT Right    knee   LOWER EXTREMITY VENOGRAPHY Right 07/14/2016   Procedure: Lower Extremity Venography;  Surgeon: Penne Lonni Colorado, MD;  Location: [facility] INVASIVE CV LAB;  Service: Cardiovascular;  Laterality: Right;   PERCUTANEOUS VENOUS THROMBECTOMY,LYSIS WITH INTRAVASCULAR ULTRASOUND (IVUS) Right 07/17/2016   Procedure: PERCUTANEOUS VENOUS THROMBECTOMY AND LYSIS WITH INTRAVASCULAR ULTRASOUND (IVUS) of right lower extermity with balloon angioplasty;  Surgeon: Penne Lonni  Sheree, MD;  Location: [facility] OR;  Service: Vascular;  Laterality: Right;   RIGHT/LEFT HEART CATH AND CORONARY ANGIOGRAPHY N/A 04/10/2023   Procedure: RIGHT/LEFT HEART CATH AND CORONARY ANGIOGRAPHY;  Surgeon: Wendel Lurena POUR, MD;  Location: [facility] INVASIVE CV LAB;  Service: Cardiovascular;  Laterality: N/A;   SVT ABLATION N/A 02/20/2017   Procedure: SVT ABLATION;  Surgeon: Waddell Danelle ORN, MD;   Location: [facility] INVASIVE CV LAB;  Service: Cardiovascular;  Laterality: N/A;   TONSILLECTOMY  08/2002   TOTAL KNEE ARTHROPLASTY Right    TOTAL KNEE ARTHROPLASTY Left 02/21/2014   Procedure: LEFT TOTAL KNEE ARTHROPLASTY;  Surgeon: Maude KANDICE Herald, MD;  Location: [facility] OR;  Service: Orthopedics;  Laterality: Left;   TUBAL LIGATION  1980   ULTRASOUND GUIDANCE FOR VASCULAR ACCESS Right 07/17/2016   Procedure: ULTRASOUND GUIDANCE FOR VASCULAR ACCESS;  Surgeon: Penne Lonni Sheree, MD;  Location: [facility] OR;  Service: Vascular;  Laterality: Right;   VENOGRAM Right 07/17/2016   Procedure: right ASCENDING VENOGRAM WITH ANGIOJET;  Surgeon: Penne Lonni Sheree, MD;  Location: [facility] OR;  Service: Vascular;  Laterality: Right;   Family History  Problem Relation Age of Onset   Clotting disorder Mother 51       Blood clot, foot amputation   Cervical cancer Mother    Arthritis Sister    Heart attack Maternal Grandmother 24   Breast cancer Sister 58   Asthma Cousin    Social History   Occupational History   Occupation: Unemployed  Tobacco Use   Smoking status: Never    Passive exposure: Never   Smokeless tobacco: Never  Vaping Use   Vaping status: Never Used  Substance and Sexual Activity   Alcohol use: No   Drug use: No   Sexual activity: Not Currently    Birth control/protection: Post-menopausal   Tobacco Counseling Counseling given: Not Answered  SDOH Screenings   Food Insecurity: No Food Insecurity (03/24/2024)  Recent Concern: Food Insecurity - Food Insecurity Present (02/04/2024)   Received from [facility]  Housing: Unknown (03/24/2024)  Transportation Needs: No Transportation Needs (03/24/2024)  Utilities: Not At Risk (03/24/2024)  Depression (PHQ2-9): Low Risk (03/24/2024)  Recent Concern: Depression (PHQ2-9) - Medium Risk (01/11/2024)  Financial Resource Strain: Low Risk  (02/04/2024)   Received from [facility]  Physical Activity: Inactive (03/24/2024)   Social Connections: Moderately Isolated (03/24/2024)  Stress: No Stress Concern Present (03/24/2024)  Tobacco Use: Low Risk (03/24/2024)  Health Literacy: Adequate Health Literacy (03/24/2024)   See flowsheets for full screening details  Depression Screen PHQ 2 & 9 Depression Scale- Over the past 2 weeks, how often have you been bothered by any of the following problems? Little interest or pleasure in doing things: 0 Feeling down, depressed, or hopeless (PHQ Adolescent also includes...irritable): 0 PHQ-2 Total Score: 0 Trouble falling or staying asleep, or sleeping too much: 0 Feeling tired or having little energy: 0 Poor appetite or overeating (PHQ Adolescent also includes...weight loss): 0 Feeling bad about yourself - or that you are a failure or have let yourself or your family down: 0 Trouble concentrating on things, such as reading the newspaper or watching television (PHQ Adolescent also includes...like school work): 0 Moving or speaking so slowly that other people could have noticed. Or the opposite - being so fidgety or restless that you have been moving around a lot more than usual: 0 Thoughts that you would be better off dead, or of hurting yourself in some way: 0 PHQ-9 Total  Score: 0 If you checked off any problems, how difficult have these problems made it for you to do your work, take care of things at home, or get along with other people?: Not difficult at all  Depression Treatment Depression Interventions/Treatment : EYV7-0 Score <4 Follow-up Not Indicated     Goals Addressed             This Visit's Progress    Weight (lb) < 200 lb (90.7 kg)   215 lb (97.5 kg)            Objective:    Today's Vitals   03/24/24 1141  Weight: 215 lb (97.5 kg)  Height: 5' 3 (1.6 m)   Body mass index is 38.09 kg/m.  Hearing/Vision screen Hearing Screening - Comments:: Bilateral hearing Vision Screening - Comments:: [facility] Up to date Immunizations and Health  Maintenance Health Maintenance  Topic Date Due   FOOT EXAM  Never done   OPHTHALMOLOGY EXAM  Never done   Diabetic kidney evaluation - Urine ACR  Never done   Bone Density Scan  11/15/2019   Mammogram  01/12/2021   COVID-19 Vaccine (5 - 2025-26 season) 11/16/2023   Colonoscopy  03/24/2025 (Originally 03/17/2021)   HEMOGLOBIN A1C  08/24/2024   Diabetic kidney evaluation - eGFR measurement  03/15/2025   Medicare Annual Wellness (AWV)  03/24/2025   DTaP/Tdap/Td (3 - Tdap) 09/25/2027   Pneumococcal Vaccine: 50+ Years  Completed   Influenza Vaccine  Completed   Hepatitis C Screening  Completed   Zoster Vaccines- Shingrix  Completed   Meningococcal B Vaccine  Aged Out        Assessment/Plan:  This is a routine wellness examination for [place].  Patient Care Team: Jerrell Cleatus Ned, MD as PCP - General (Internal Medicine) Verlin Lonni BIRCH, MD as PCP - Structural Heart (Cardiology) Verlin Lonni BIRCH, MD as PCP - Cardiology (Cardiology)  I have personally reviewed and noted the following in the patients chart:   Medical and social history Use of alcohol, tobacco or illicit drugs  Current medications and supplements including opioid prescriptions. Functional ability and status Nutritional status Physical activity Advanced directives List of other physicians Hospitalizations, surgeries, and ER visits in previous 12 months Vitals Screenings to include cognitive, depression, and falls Referrals and appointments  No orders of the defined types were placed in this encounter.  In addition, I have reviewed and discussed with patient certain preventive protocols, quality metrics, and best practice recommendations. A written personalized care plan for preventive services as well as general preventive health recommendations were provided to patient.   Lesta Limbert, LPN   10/16/7971   Return in 1 year (on 03/24/2025).  After Visit Summary: (MyChart) Due to this being a  telephonic visit, the after visit summary with patients personalized plan was offered to patient via MyChart   Nurse Notes:  "

## 2024-03-24 NOTE — Patient Instructions (Signed)
 [name],  Thank you for taking the time for your Medicare Wellness Visit. I appreciate your continued commitment to your health goals. Please review the care plan we discussed, and feel free to reach out if I can assist you further.  Please note that Annual Wellness Visits do not include a physical exam. Some assessments may be limited, especially if the visit was conducted virtually. If needed, we may recommend an in-person follow-up with your provider.  Ongoing Care Seeing your primary care provider every 3 to 6 months helps us  monitor your health and provide consistent, personalized care.   Referrals If a referral was made during today's visit and you haven't received any updates within two weeks, please contact the referred provider directly to check on the status.  Recommended Screenings:  Health Maintenance  Topic Date Due   Complete foot exam   Never done   Eye exam for diabetics  Never done   Yearly kidney health urinalysis for diabetes  Never done   Osteoporosis screening with Bone Density Scan  11/15/2019   Breast Cancer Screening  01/12/2021   COVID-19 Vaccine (5 - 2025-26 season) 11/16/2023   Colon Cancer Screening  03/24/2025*   Hemoglobin A1C  08/24/2024   Yearly kidney function blood test for diabetes  03/15/2025   Medicare Annual Wellness Visit  03/24/2025   DTaP/Tdap/Td vaccine (3 - Tdap) 09/25/2027   Pneumococcal Vaccine for age over 75  Completed   Flu Shot  Completed   Hepatitis C Screening  Completed   Zoster (Shingles) Vaccine  Completed   Meningitis B Vaccine  Aged Out  *Topic was postponed. The date shown is not the original due date.       03/24/2024   11:28 AM  Advanced Directives  Does Patient Have a Medical Advance Directive? Yes  Type of Advance Directive Healthcare Power of Attorney  Copy of Healthcare Power of Attorney in Chart? No - copy requested    Vision: Annual vision screenings are recommended for early detection of glaucoma, cataracts,  and diabetic retinopathy. These exams can also reveal signs of chronic conditions such as diabetes and high blood pressure.  Dental: Annual dental screenings help detect early signs of oral cancer, gum disease, and other conditions linked to overall health, including heart disease and diabetes.  Please see the attached documents for additional preventive care recommendations.    [name] , Thank you for taking time to come for your Medicare Wellness Visit. I appreciate your ongoing commitment to your health goals. Please review the following plan we discussed and let me know if I can assist you in the future.   Screening recommendations/referrals: Colonoscopy:  Mammogram:  Bone Density:  Recommended yearly ophthalmology/optometry visit for glaucoma screening and checkup Recommended yearly dental visit for hygiene and checkup  Vaccinations: Influenza vaccine:  Pneumococcal vaccine:  Tdap vaccine:  Shingles vaccine:       Preventive Care 65 Years and Older, Female Preventive care refers to lifestyle choices and visits with your health care provider that can promote health and wellness. What does preventive care include? A yearly physical exam. This is also called an annual well check. Dental exams once or twice a year. Routine eye exams. Ask your health care provider how often you should have your eyes checked. Personal lifestyle choices, including: Daily care of your teeth and gums. Regular physical activity. Eating a healthy diet. Avoiding tobacco and drug use. Limiting alcohol use. Practicing safe sex. Taking low-dose aspirin  every day. Taking vitamin  and mineral supplements as recommended by your health care provider. What happens during an annual well check? The services and screenings done by your health care provider during your annual well check will depend on your age, overall health, lifestyle risk factors, and family history of disease. Counseling  Your health care  provider may ask you questions about your: Alcohol use. Tobacco use. Drug use. Emotional well-being. Home and relationship well-being. Sexual activity. Eating habits. History of falls. Memory and ability to understand (cognition). Work and work astronomer. Reproductive health. Screening  You may have the following tests or measurements: Height, weight, and BMI. Blood pressure. Lipid and cholesterol levels. These may be checked every 5 years, or more frequently if you are over 55 years old. Skin check. Lung cancer screening. You may have this screening every year starting at age 62 if you have a 30-pack-year history of smoking and currently smoke or have quit within the past 15 years. Fecal occult blood test (FOBT) of the stool. You may have this test every year starting at age 34. Flexible sigmoidoscopy or colonoscopy. You may have a sigmoidoscopy every 5 years or a colonoscopy every 10 years starting at age 55. Hepatitis C blood test. Hepatitis B blood test. Sexually transmitted disease (STD) testing. Diabetes screening. This is done by checking your blood sugar (glucose) after you have not eaten for a while (fasting). You may have this done every 1-3 years. Bone density scan. This is done to screen for osteoporosis. You may have this done starting at age 33. Mammogram. This may be done every 1-2 years. Talk to your health care provider about how often you should have regular mammograms. Talk with your health care provider about your test results, treatment options, and if necessary, the need for more tests. Vaccines  Your health care provider may recommend certain vaccines, such as: Influenza vaccine. This is recommended every year. Tetanus, diphtheria, and acellular pertussis (Tdap, Td) vaccine. You may need a Td booster every 10 years. Zoster vaccine. You may need this after age 68. Pneumococcal 13-valent conjugate (PCV13) vaccine. One dose is recommended after age  34. Pneumococcal polysaccharide (PPSV23) vaccine. One dose is recommended after age 33. Talk to your health care provider about which screenings and vaccines you need and how often you need them. This information is not intended to replace advice given to you by your health care provider. Make sure you discuss any questions you have with your health care provider. Document Released: 03/30/2015 Document Revised: 11/21/2015 Document Reviewed: 01/02/2015 Elsevier Interactive Patient Education  2017 [employer].  Fall Prevention in the Home Falls can cause injuries. They can happen to people of all ages. There are many things you can do to make your home safe and to help prevent falls. What can I do on the outside of my home? Regularly fix the edges of walkways and driveways and fix any cracks. Remove anything that might make you trip as you walk through a door, such as a raised step or threshold. Trim any bushes or trees on the path to your home. Use bright outdoor lighting. Clear any walking paths of anything that might make someone trip, such as rocks or tools. Regularly check to see if handrails are loose or broken. Make sure that both sides of any steps have handrails. Any raised decks and porches should have guardrails on the edges. Have any leaves, snow, or ice cleared regularly. Use sand or salt on walking paths during winter. Clean up any spills  in your garage right away. This includes oil or grease spills. What can I do in the bathroom? Use night lights. Install grab bars by the toilet and in the tub and shower. Do not use towel bars as grab bars. Use non-skid mats or decals in the tub or shower. If you need to sit down in the shower, use a plastic, non-slip stool. Keep the floor dry. Clean up any water  that spills on the floor as soon as it happens. Remove soap buildup in the tub or shower regularly. Attach bath mats securely with double-sided non-slip rug tape. Do not have throw  rugs and other things on the floor that can make you trip. What can I do in the bedroom? Use night lights. Make sure that you have a light by your bed that is easy to reach. Do not use any sheets or blankets that are too big for your bed. They should not hang down onto the floor. Have a firm chair that has side arms. You can use this for support while you get dressed. Do not have throw rugs and other things on the floor that can make you trip. What can I do in the kitchen? Clean up any spills right away. Avoid walking on wet floors. Keep items that you use a lot in easy-to-reach places. If you need to reach something above you, use a strong step stool that has a grab bar. Keep electrical cords out of the way. Do not use floor polish or wax that makes floors slippery. If you must use wax, use non-skid floor wax. Do not have throw rugs and other things on the floor that can make you trip. What can I do with my stairs? Do not leave any items on the stairs. Make sure that there are handrails on both sides of the stairs and use them. Fix handrails that are broken or loose. Make sure that handrails are as long as the stairways. Check any carpeting to make sure that it is firmly attached to the stairs. Fix any carpet that is loose or worn. Avoid having throw rugs at the top or bottom of the stairs. If you do have throw rugs, attach them to the floor with carpet tape. Make sure that you have a light switch at the top of the stairs and the bottom of the stairs. If you do not have them, ask someone to add them for you. What else can I do to help prevent falls? Wear shoes that: Do not have high heels. Have rubber bottoms. Are comfortable and fit you well. Are closed at the toe. Do not wear sandals. If you use a stepladder: Make sure that it is fully opened. Do not climb a closed stepladder. Make sure that both sides of the stepladder are locked into place. Ask someone to hold it for you, if  possible. Clearly mark and make sure that you can see: Any grab bars or handrails. First and last steps. Where the edge of each step is. Use tools that help you move around (mobility aids) if they are needed. These include: Canes. Walkers. Scooters. Crutches. Turn on the lights when you go into a dark area. Replace any light bulbs as soon as they burn out. Set up your furniture so you have a clear path. Avoid moving your furniture around. If any of your floors are uneven, fix them. If there are any pets around you, be aware of where they are. Review your medicines with your doctor. Some  medicines can make you feel dizzy. This can increase your chance of falling. Ask your doctor what other things that you can do to help prevent falls. This information is not intended to replace advice given to you by your health care provider. Make sure you discuss any questions you have with your health care provider. Document Released: 12/28/2008 Document Revised: 08/09/2015 Document Reviewed: 04/07/2014 Elsevier Interactive Patient Education  2017 [employer].

## 2024-03-31 ENCOUNTER — Other Ambulatory Visit: Payer: Self-pay | Admitting: Student in an Organized Health Care Education/Training Program

## 2024-03-31 NOTE — Telephone Encounter (Signed)
 Okay to refill under your name?

## 2024-04-12 ENCOUNTER — Ambulatory Visit (INDEPENDENT_AMBULATORY_CARE_PROVIDER_SITE_OTHER): Payer: Self-pay | Admitting: Student in an Organized Health Care Education/Training Program

## 2024-04-12 ENCOUNTER — Encounter: Payer: Self-pay | Admitting: Student in an Organized Health Care Education/Training Program

## 2024-04-12 VITALS — BP 120/59 | HR 97 | Ht 64.0 in | Wt 213.0 lb

## 2024-04-12 DIAGNOSIS — M810 Age-related osteoporosis without current pathological fracture: Secondary | ICD-10-CM

## 2024-04-12 DIAGNOSIS — I10 Essential (primary) hypertension: Secondary | ICD-10-CM | POA: Diagnosis not present

## 2024-04-12 DIAGNOSIS — G8929 Other chronic pain: Secondary | ICD-10-CM | POA: Diagnosis not present

## 2024-04-12 DIAGNOSIS — N058 Unspecified nephritic syndrome with other morphologic changes: Secondary | ICD-10-CM | POA: Diagnosis not present

## 2024-04-12 DIAGNOSIS — Z1231 Encounter for screening mammogram for malignant neoplasm of breast: Secondary | ICD-10-CM

## 2024-04-12 DIAGNOSIS — N3281 Overactive bladder: Secondary | ICD-10-CM | POA: Diagnosis not present

## 2024-04-12 DIAGNOSIS — N057 Unspecified nephritic syndrome with diffuse crescentic glomerulonephritis: Secondary | ICD-10-CM

## 2024-04-12 DIAGNOSIS — M546 Pain in thoracic spine: Secondary | ICD-10-CM

## 2024-04-12 DIAGNOSIS — R4189 Other symptoms and signs involving cognitive functions and awareness: Secondary | ICD-10-CM

## 2024-04-12 NOTE — Assessment & Plan Note (Signed)
 A bladder stimulator was implanted one month ago. Some improvement is noted, but there is discomfort with higher settings. Adjustments are ongoing. She should continue follow-up with the nurse for stimulator adjustments and monitor symptoms to adjust settings for symptom control and comfort.

## 2024-04-12 NOTE — Assessment & Plan Note (Signed)
 Chronic and stable. Memory has improved with phone reminders and cognitive activities. She should continue cognitive activities to support memory function.  I recommended continuing with daily exercise, getting out of the house every day, and increased socializing.

## 2024-04-12 NOTE — Patient Instructions (Signed)
" °  VISIT SUMMARY: During your visit, we discussed your upper back pain during exercise, the status of your bladder stimulator, and your overall health management.  YOUR PLAN: -OSTEOARTHRITIS OF THORACIC SPINE: Osteoarthritis is a condition where the cartilage in your joints wears down over time. Your chronic mid back pain is likely due to osteoarthritis and is worsened by exercise. We have ordered physical therapy for six weeks, once a week. Please report any worsening pain or if it starts to interfere with your daily activities, as we may need to consider an MRI.  -OVERACTIVE BLADDER WITH NEUROMODULATION: Overactive bladder is a condition where you feel a sudden urge to urinate. You had a bladder stimulator implanted one month ago, which has shown some improvement. However, you are experiencing discomfort with higher settings. Continue to follow up with the nurse for stimulator adjustments and monitor your symptoms to adjust the settings for better control and comfort.  -HYPERTENSION: Hypertension is high blood pressure. Your blood pressure is well-controlled at 120/59 mmHg. Continue with your current management plan.  -VASCULITIS: Vasculitis is inflammation of your blood vessels. Your condition is well-managed, and your kidney function tests are good. Continue with your current management plan and follow up with your nephrologist in February.  -MEMORY IMPAIRMENT: Memory impairment means having trouble remembering things. Your memory has improved with phone reminders and cognitive activities. Continue these activities to support your memory function.  INSTRUCTIONS: Please follow up with the nurse for stimulator adjustments and monitor your symptoms. Report any worsening back pain or interference with daily activities. Continue your current management plans for hypertension and vasculitis, and follow up with your nephrologist in February.    Contains text generated by [employer].   "

## 2024-04-12 NOTE — Assessment & Plan Note (Signed)
 Chronic mid back pain likely due to osteoarthritis is exacerbated by exercise.  No high risk, red flag symptoms present.  No fevers or chills or neurologic deficits on exam.  Symptoms are manageable, not functional limiting, and no imaging is needed. Physical therapy is ordered for six weeks, once a week. She should report worsening pain or interference with daily activities.  She is not interested in a procedural approach to this back pain.  Would like to avoid anti-inflammatories like NSAIDs because of her history of glomerulonephritis.  Recommend using Tylenol .  If pain becomes a more daily issue, could consider switching Lexapro  to duloxetine.

## 2024-04-12 NOTE — Assessment & Plan Note (Signed)
 Blood pressure is well-controlled at 120/59 mmHg.  Plan to continue with telmisartan.

## 2024-04-12 NOTE — Assessment & Plan Note (Signed)
 The condition is well-managed with good kidney function test results. She should continue current management and follow up with the nephrologist in February.

## 2024-04-12 NOTE — Progress Notes (Signed)
 "  Established Patient Office Visit  Patient ID: Cassandra [name], female    DOB: 06-Jun-1955  Age: 69 y.o. MRN: [id number] PCP: Jerrell Cleatus Ned, MD  Chief Complaint  Patient presents with   Medical Management of Chronic Issues    Six month follow-up     Subjective:     HPI  Discussed the use of AI scribe software for clinical note transcription with the patient, who gave verbal consent to proceed.  History of Present Illness Cassandra [name] is a 69 year old female who presents with upper back pain during exercise.  She experiences aching pain in the upper part of her back during exercise, regardless of the type of activity, such as riding a bicycle or walking. This has been occurring for a few months. Initially, she did not pay much attention to it, but noticed it consistently happens with exercise. She has been taking Tylenol  and resting, which provides minimal relief. No falls, pain radiating down her legs, or neck pain. She is able to get around on her own.  She had a bladder stimulator implanted approximately one month ago. The stimulator is helping slightly, but her body is still adjusting to it. She experiences discomfort when adjusting the stimulator, describing a sensation of being 'electrocuted' in the vaginal area, which she manages by adjusting the levels. She currently has the stimulator set at level three and carries the controller with her.  She mentions having migraines, which influenced her decision not to drive long distances. She is managing her memory by engaging in activities on her phone, such as puzzles, and feels her memory is not as bad as it was before.  Her sister has a history of severe arthritis and scoliosis, requiring multiple surgeries.     Objective:     BP (!) 120/59   Pulse 97   Ht 5' 4 (1.626 m)   Wt 213 lb (96.6 kg)   SpO2 98%   BMI 36.56 kg/m   Physical Exam  Gen: Well-appearing woman Ears: Hard of hearing, hearing aids are in place  bilaterally Neck: Normal thyroid , no nodules or adenopathy Heart: Regular, 3 out of 6 early stock murmur best heard at the left upper sternal border Lungs: Unlabored, clear throughout Back: Mild discomfort over the mid back in the midline, no bruising or deformity Ext: Warm, no edema Neuro: Alert, conversational, mildly decreased get up and go, full strength upper and lower extremities, normal gait and balance, normal sensation throughout    Assessment & Plan:   Problem List Items Addressed This Visit       High   OAB (overactive bladder) (Chronic)   A bladder stimulator was implanted one month ago. Some improvement is noted, but there is discomfort with higher settings. Adjustments are ongoing. She should continue follow-up with the nurse for stimulator adjustments and monitor symptoms to adjust settings for symptom control and comfort.      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 She should continue current management and follow up with the nephrologist in February.        Medium    Benign essential hypertension (Chronic)   Blood pressure is well-controlled at 120/59 mmHg.  Plan to continue with telmisartan.      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 Memory has improved with phone reminders and cognitive activities. She should continue cognitive activities to support memory function.  I  recommended continuing with daily exercise, getting out of the house every day, and increased socializing.      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  No high risk, red flag symptoms present.  No fevers or chills or neurologic deficits on exam.  Symptoms are manageable, not functional limiting, and no imaging is needed. Physical therapy is ordered for six weeks, once a week. She should  report worsening pain or interference with daily activities.  She is not interested in a procedural approach to this back pain.  Would like to avoid anti-inflammatories like NSAIDs because of her history of glomerulonephritis.  Recommend using Tylenol .  If pain becomes a more daily issue, could consider switching Lexapro  to duloxetine.      Relevant Orders   Ambulatory referral to Physical Therapy     Low   Osteoporosis without current pathological fracture (Chronic)   Relevant Orders   DG Bone Density   Other Visit Diagnoses       Screening mammogram for breast cancer       Relevant Orders   MM 3D SCREENING MAMMOGRAM BILATERAL BREAST       Return in about 6 months (around 10/10/2024).    Cleatus Debby Specking, MD [facility]   "

## 2024-04-13 NOTE — Telephone Encounter (Signed)
 Patient called the office and reports that the PT referral is not accepted at [facility] office, can you assist in finding other suitable office?

## 2024-04-13 NOTE — Telephone Encounter (Signed)
 Copied from CRM #[id number]. Topic: Referral - Status >> Apr 13, 2024  3:27 PM Alfonso ORN wrote: Reason for CRM: pt stated that clinic for referral told her that they dont accept her insurance. Please assist with switching [facility] ) callback:  [phone]

## 2024-04-15 ENCOUNTER — Other Ambulatory Visit: Payer: Self-pay | Admitting: Student in an Organized Health Care Education/Training Program

## 2024-04-15 DIAGNOSIS — M810 Age-related osteoporosis without current pathological fracture: Secondary | ICD-10-CM

## 2024-04-27 ENCOUNTER — Ambulatory Visit

## 2024-05-30 ENCOUNTER — Other Ambulatory Visit (HOSPITAL_COMMUNITY)

## 2024-05-30 ENCOUNTER — Ambulatory Visit: Admitting: Physician Assistant

## 2024-08-23 ENCOUNTER — Ambulatory Visit: Admitting: Dermatology

## 2024-10-11 ENCOUNTER — Ambulatory Visit: Admitting: Student in an Organized Health Care Education/Training Program
